# Patient Record
Sex: Female | Born: 1935 | Race: White | Hispanic: No | Smoking: Former smoker
Health system: Southern US, Community
[De-identification: ages and names within clinical notes are randomized; demographics above are authoritative.]

## PROBLEM LIST (undated history)

## (undated) DIAGNOSIS — B029 Zoster without complications: Secondary | ICD-10-CM

## (undated) DIAGNOSIS — I201 Angina pectoris with documented spasm: Secondary | ICD-10-CM

## (undated) DIAGNOSIS — M545 Low back pain, unspecified: Secondary | ICD-10-CM

## (undated) DIAGNOSIS — J189 Pneumonia, unspecified organism: Secondary | ICD-10-CM

## (undated) DIAGNOSIS — I341 Nonrheumatic mitral (valve) prolapse: Secondary | ICD-10-CM

## (undated) DIAGNOSIS — M797 Fibromyalgia: Secondary | ICD-10-CM

## (undated) DIAGNOSIS — Z860100 Personal history of colon polyps, unspecified: Secondary | ICD-10-CM

## (undated) DIAGNOSIS — R002 Palpitations: Secondary | ICD-10-CM

## (undated) DIAGNOSIS — M1990 Unspecified osteoarthritis, unspecified site: Secondary | ICD-10-CM

## (undated) DIAGNOSIS — Z9889 Other specified postprocedural states: Secondary | ICD-10-CM

## (undated) DIAGNOSIS — F419 Anxiety disorder, unspecified: Secondary | ICD-10-CM

## (undated) DIAGNOSIS — K5730 Diverticulosis of large intestine without perforation or abscess without bleeding: Secondary | ICD-10-CM

## (undated) DIAGNOSIS — R112 Nausea with vomiting, unspecified: Secondary | ICD-10-CM

## (undated) HISTORY — DX: Zoster without complications: B02.9

## (undated) HISTORY — PX: TONSILLECTOMY: SUR1361

## (undated) HISTORY — DX: Diverticulosis of large intestine without perforation or abscess without bleeding: K57.30

## (undated) HISTORY — PX: LUMBAR LAMINECTOMY: SHX95

## (undated) HISTORY — DX: Fibromyalgia: M79.7

## (undated) HISTORY — DX: Anxiety disorder, unspecified: F41.9

## (undated) HISTORY — DX: Low back pain, unspecified: M54.50

## (undated) HISTORY — DX: Palpitations: R00.2

## (undated) HISTORY — PX: CATARACT EXTRACTION, BILATERAL: SHX1313

## (undated) HISTORY — DX: Pneumonia, unspecified organism: J18.9

## (undated) HISTORY — DX: Low back pain: M54.5

## (undated) HISTORY — DX: Personal history of colonic polyps: Z86.010

## (undated) HISTORY — DX: Personal history of colon polyps, unspecified: Z86.0100

## (undated) HISTORY — DX: Nonrheumatic mitral (valve) prolapse: I34.1

## (undated) HISTORY — DX: Angina pectoris with documented spasm: I20.1

## (undated) HISTORY — PX: COLONOSCOPY: SHX174

## (undated) HISTORY — PX: CARDIAC CATHETERIZATION: SHX172

---

## 1998-02-03 ENCOUNTER — Inpatient Hospital Stay (HOSPITAL_COMMUNITY): Admission: EM | Admit: 1998-02-03 | Discharge: 1998-02-04 | Payer: Self-pay | Admitting: Emergency Medicine

## 1999-04-13 ENCOUNTER — Other Ambulatory Visit: Admission: RE | Admit: 1999-04-13 | Discharge: 1999-04-13 | Payer: Self-pay | Admitting: Obstetrics and Gynecology

## 2000-04-13 ENCOUNTER — Other Ambulatory Visit: Admission: RE | Admit: 2000-04-13 | Discharge: 2000-04-13 | Payer: Self-pay | Admitting: Obstetrics and Gynecology

## 2000-04-16 ENCOUNTER — Encounter: Payer: Self-pay | Admitting: Otolaryngology

## 2000-04-16 ENCOUNTER — Encounter: Admission: RE | Admit: 2000-04-16 | Discharge: 2000-04-16 | Payer: Self-pay | Admitting: Otolaryngology

## 2001-04-15 ENCOUNTER — Other Ambulatory Visit: Admission: RE | Admit: 2001-04-15 | Discharge: 2001-04-15 | Payer: Self-pay | Admitting: Obstetrics and Gynecology

## 2002-04-15 ENCOUNTER — Other Ambulatory Visit: Admission: RE | Admit: 2002-04-15 | Discharge: 2002-04-15 | Payer: Self-pay | Admitting: Obstetrics and Gynecology

## 2002-05-06 ENCOUNTER — Encounter: Payer: Self-pay | Admitting: Obstetrics and Gynecology

## 2002-05-06 ENCOUNTER — Encounter: Admission: RE | Admit: 2002-05-06 | Discharge: 2002-05-06 | Payer: Self-pay | Admitting: Obstetrics and Gynecology

## 2003-02-06 ENCOUNTER — Ambulatory Visit (HOSPITAL_COMMUNITY): Admission: RE | Admit: 2003-02-06 | Discharge: 2003-02-06 | Payer: Self-pay | Admitting: Pulmonary Disease

## 2003-02-06 ENCOUNTER — Encounter: Payer: Self-pay | Admitting: Pulmonary Disease

## 2003-03-02 HISTORY — PX: CHOLECYSTECTOMY: SHX55

## 2003-03-05 ENCOUNTER — Encounter (INDEPENDENT_AMBULATORY_CARE_PROVIDER_SITE_OTHER): Payer: Self-pay | Admitting: Specialist

## 2003-03-05 ENCOUNTER — Observation Stay (HOSPITAL_COMMUNITY): Admission: RE | Admit: 2003-03-05 | Discharge: 2003-03-06 | Payer: Self-pay | Admitting: Surgery

## 2003-04-21 ENCOUNTER — Other Ambulatory Visit: Admission: RE | Admit: 2003-04-21 | Discharge: 2003-04-21 | Payer: Self-pay | Admitting: Obstetrics and Gynecology

## 2003-06-10 ENCOUNTER — Encounter (INDEPENDENT_AMBULATORY_CARE_PROVIDER_SITE_OTHER): Payer: Self-pay | Admitting: *Deleted

## 2003-06-10 ENCOUNTER — Ambulatory Visit (HOSPITAL_COMMUNITY): Admission: RE | Admit: 2003-06-10 | Discharge: 2003-06-10 | Payer: Self-pay | Admitting: Internal Medicine

## 2003-06-12 ENCOUNTER — Ambulatory Visit (HOSPITAL_COMMUNITY): Admission: RE | Admit: 2003-06-12 | Discharge: 2003-06-12 | Payer: Self-pay | Admitting: Internal Medicine

## 2003-07-10 ENCOUNTER — Ambulatory Visit (HOSPITAL_COMMUNITY): Admission: RE | Admit: 2003-07-10 | Discharge: 2003-07-10 | Payer: Self-pay | Admitting: Internal Medicine

## 2004-04-21 ENCOUNTER — Other Ambulatory Visit: Admission: RE | Admit: 2004-04-21 | Discharge: 2004-04-21 | Payer: Self-pay | Admitting: Obstetrics and Gynecology

## 2004-04-29 ENCOUNTER — Ambulatory Visit: Payer: Self-pay | Admitting: Internal Medicine

## 2004-07-04 ENCOUNTER — Ambulatory Visit: Payer: Self-pay | Admitting: Pulmonary Disease

## 2004-07-05 ENCOUNTER — Ambulatory Visit: Payer: Self-pay | Admitting: Internal Medicine

## 2004-11-14 ENCOUNTER — Ambulatory Visit: Payer: Self-pay | Admitting: Pulmonary Disease

## 2005-01-09 ENCOUNTER — Encounter: Admission: RE | Admit: 2005-01-09 | Discharge: 2005-01-09 | Payer: Self-pay | Admitting: Orthopedic Surgery

## 2005-02-20 ENCOUNTER — Ambulatory Visit: Payer: Self-pay | Admitting: Cardiology

## 2005-05-09 ENCOUNTER — Other Ambulatory Visit: Admission: RE | Admit: 2005-05-09 | Discharge: 2005-05-09 | Payer: Self-pay | Admitting: Obstetrics and Gynecology

## 2005-05-10 ENCOUNTER — Ambulatory Visit: Payer: Self-pay | Admitting: Pulmonary Disease

## 2005-05-16 ENCOUNTER — Ambulatory Visit: Payer: Self-pay | Admitting: Pulmonary Disease

## 2005-05-24 ENCOUNTER — Ambulatory Visit: Payer: Self-pay | Admitting: Pulmonary Disease

## 2005-05-29 ENCOUNTER — Ambulatory Visit: Payer: Self-pay | Admitting: Pulmonary Disease

## 2005-08-28 ENCOUNTER — Ambulatory Visit: Payer: Self-pay | Admitting: Pulmonary Disease

## 2005-09-05 ENCOUNTER — Ambulatory Visit: Payer: Self-pay | Admitting: Pulmonary Disease

## 2005-11-14 ENCOUNTER — Ambulatory Visit: Payer: Self-pay | Admitting: Pulmonary Disease

## 2006-03-13 ENCOUNTER — Ambulatory Visit: Payer: Self-pay | Admitting: Cardiology

## 2006-03-14 ENCOUNTER — Ambulatory Visit: Payer: Self-pay | Admitting: Cardiology

## 2006-05-15 ENCOUNTER — Ambulatory Visit: Payer: Self-pay | Admitting: Pulmonary Disease

## 2006-05-15 LAB — CONVERTED CEMR LAB
Alkaline Phosphatase: 92 units/L (ref 39–117)
Basophils Relative: 2.4 % — ABNORMAL HIGH (ref 0.0–1.0)
CO2: 29 meq/L (ref 19–32)
Calcium: 10 mg/dL (ref 8.4–10.5)
Creatinine, Ser: 0.8 mg/dL (ref 0.4–1.2)
GFR calc Af Amer: 92 mL/min
Lymphocytes Relative: 18.2 % (ref 12.0–46.0)
MCHC: 33.3 g/dL (ref 30.0–36.0)
MCV: 99.8 fL (ref 78.0–100.0)
Monocytes Absolute: 0.6 10*3/uL (ref 0.2–0.7)
Monocytes Relative: 8.4 % (ref 3.0–11.0)
Neutrophils Relative %: 68.3 % (ref 43.0–77.0)
Platelets: 343 10*3/uL (ref 150–400)
Sodium: 139 meq/L (ref 135–145)
Total CHOL/HDL Ratio: 2.8
Triglycerides: 106 mg/dL (ref 0–149)
VLDL: 21 mg/dL (ref 0–40)

## 2006-05-16 ENCOUNTER — Other Ambulatory Visit: Admission: RE | Admit: 2006-05-16 | Discharge: 2006-05-16 | Payer: Self-pay | Admitting: Obstetrics and Gynecology

## 2006-05-21 ENCOUNTER — Ambulatory Visit: Payer: Self-pay | Admitting: Family Medicine

## 2006-05-28 ENCOUNTER — Ambulatory Visit: Payer: Self-pay | Admitting: Pulmonary Disease

## 2006-05-28 LAB — CONVERTED CEMR LAB
OCCULT 1: POSITIVE — AB
OCCULT 2: POSITIVE — AB
OCCULT 4: NEGATIVE
OCCULT 5: NEGATIVE

## 2006-09-20 ENCOUNTER — Ambulatory Visit: Payer: Self-pay | Admitting: Cardiology

## 2006-11-26 ENCOUNTER — Ambulatory Visit: Payer: Self-pay | Admitting: Pulmonary Disease

## 2007-04-30 ENCOUNTER — Telehealth: Payer: Self-pay | Admitting: Pulmonary Disease

## 2007-05-07 ENCOUNTER — Ambulatory Visit: Payer: Self-pay | Admitting: Pulmonary Disease

## 2007-05-20 DIAGNOSIS — M797 Fibromyalgia: Secondary | ICD-10-CM

## 2007-05-20 DIAGNOSIS — M545 Low back pain, unspecified: Secondary | ICD-10-CM | POA: Insufficient documentation

## 2007-05-20 DIAGNOSIS — F411 Generalized anxiety disorder: Secondary | ICD-10-CM

## 2007-05-21 ENCOUNTER — Other Ambulatory Visit: Admission: RE | Admit: 2007-05-21 | Discharge: 2007-05-21 | Payer: Self-pay | Admitting: Obstetrics and Gynecology

## 2007-05-21 ENCOUNTER — Ambulatory Visit: Payer: Self-pay | Admitting: Pulmonary Disease

## 2007-05-21 DIAGNOSIS — I10 Essential (primary) hypertension: Secondary | ICD-10-CM

## 2007-05-21 DIAGNOSIS — M810 Age-related osteoporosis without current pathological fracture: Secondary | ICD-10-CM | POA: Insufficient documentation

## 2007-05-21 DIAGNOSIS — R002 Palpitations: Secondary | ICD-10-CM

## 2007-05-21 DIAGNOSIS — K5730 Diverticulosis of large intestine without perforation or abscess without bleeding: Secondary | ICD-10-CM | POA: Insufficient documentation

## 2007-05-21 DIAGNOSIS — J329 Chronic sinusitis, unspecified: Secondary | ICD-10-CM | POA: Insufficient documentation

## 2007-05-21 DIAGNOSIS — I2510 Atherosclerotic heart disease of native coronary artery without angina pectoris: Secondary | ICD-10-CM

## 2007-05-21 LAB — CONVERTED CEMR LAB
AST: 25 units/L (ref 0–37)
BUN: 8 mg/dL (ref 6–23)
Basophils Absolute: 0.1 10*3/uL (ref 0.0–0.1)
Basophils Relative: 1.7 % — ABNORMAL HIGH (ref 0.0–1.0)
Bilirubin Urine: NEGATIVE
CO2: 28 meq/L (ref 19–32)
Calcium: 9.8 mg/dL (ref 8.4–10.5)
Creatinine, Ser: 0.8 mg/dL (ref 0.4–1.2)
Direct LDL: 84.3 mg/dL
HCT: 39.3 % (ref 36.0–46.0)
Hemoglobin, Urine: NEGATIVE
Ketones, ur: NEGATIVE mg/dL
Leukocytes, UA: NEGATIVE
Neutro Abs: 2.8 10*3/uL (ref 1.4–7.7)
Neutrophils Relative %: 57.5 % (ref 43.0–77.0)
Sodium: 139 meq/L (ref 135–145)
Specific Gravity, Urine: 1.01 (ref 1.000–1.03)
Total Protein: 6.8 g/dL (ref 6.0–8.3)
Urine Glucose: NEGATIVE mg/dL
pH: 7.5 (ref 5.0–8.0)

## 2007-07-03 ENCOUNTER — Ambulatory Visit: Payer: Self-pay | Admitting: Internal Medicine

## 2007-07-12 ENCOUNTER — Encounter: Payer: Self-pay | Admitting: Pulmonary Disease

## 2007-07-12 ENCOUNTER — Ambulatory Visit: Payer: Self-pay | Admitting: Internal Medicine

## 2007-07-12 ENCOUNTER — Encounter: Payer: Self-pay | Admitting: Internal Medicine

## 2007-07-12 LAB — CONVERTED CEMR LAB
Hemoglobin: 13.5 g/dL (ref 12.0–15.0)
Iron: 103 ug/dL (ref 42–145)
Lymphocytes Relative: 23.4 % (ref 12.0–46.0)
MCV: 103 fL — ABNORMAL HIGH (ref 78.0–100.0)
Monocytes Relative: 10.9 % (ref 3.0–11.0)
Neutro Abs: 3.2 10*3/uL (ref 1.4–7.7)
RBC: 4.05 M/uL (ref 3.87–5.11)
RDW: 11.9 % (ref 11.5–14.6)
Saturation Ratios: 35.2 % (ref 20.0–50.0)
Transferrin: 209.3 mg/dL — ABNORMAL LOW (ref >=212.0)
WBC: 4.8 10*3/uL (ref 4.5–10.5)

## 2007-07-24 ENCOUNTER — Ambulatory Visit: Payer: Self-pay | Admitting: Internal Medicine

## 2007-07-24 LAB — CONVERTED CEMR LAB
Fecal Occult Blood: NEGATIVE
OCCULT 1: NEGATIVE
OCCULT 3: NEGATIVE

## 2007-09-30 ENCOUNTER — Ambulatory Visit: Payer: Self-pay | Admitting: Cardiology

## 2007-11-12 ENCOUNTER — Ambulatory Visit: Payer: Self-pay | Admitting: Pulmonary Disease

## 2007-11-12 DIAGNOSIS — K5520 Angiodysplasia of colon without hemorrhage: Secondary | ICD-10-CM | POA: Insufficient documentation

## 2007-11-17 DIAGNOSIS — K589 Irritable bowel syndrome without diarrhea: Secondary | ICD-10-CM | POA: Insufficient documentation

## 2007-11-17 DIAGNOSIS — D126 Benign neoplasm of colon, unspecified: Secondary | ICD-10-CM

## 2007-11-17 LAB — CONVERTED CEMR LAB: Vit D, 1,25-Dihydroxy: 63 (ref 30–89)

## 2008-04-28 ENCOUNTER — Encounter: Payer: Self-pay | Admitting: Pulmonary Disease

## 2008-05-07 ENCOUNTER — Telehealth: Payer: Self-pay | Admitting: Pulmonary Disease

## 2008-05-25 ENCOUNTER — Other Ambulatory Visit: Admission: RE | Admit: 2008-05-25 | Discharge: 2008-05-25 | Payer: Self-pay | Admitting: Obstetrics and Gynecology

## 2008-05-25 ENCOUNTER — Encounter: Payer: Self-pay | Admitting: Obstetrics and Gynecology

## 2008-05-25 ENCOUNTER — Ambulatory Visit: Payer: Self-pay | Admitting: Obstetrics and Gynecology

## 2008-06-04 ENCOUNTER — Telehealth: Payer: Self-pay | Admitting: Pulmonary Disease

## 2008-06-08 ENCOUNTER — Encounter: Payer: Self-pay | Admitting: Pulmonary Disease

## 2008-06-08 ENCOUNTER — Ambulatory Visit: Payer: Self-pay | Admitting: Internal Medicine

## 2008-06-16 ENCOUNTER — Ambulatory Visit: Payer: Self-pay | Admitting: Pulmonary Disease

## 2008-06-23 ENCOUNTER — Ambulatory Visit: Payer: Self-pay | Admitting: Pulmonary Disease

## 2008-06-23 LAB — CONVERTED CEMR LAB
ALT: 18 units/L (ref 0–35)
Alkaline Phosphatase: 47 units/L (ref 39–117)
Basophils Absolute: 0 10*3/uL (ref 0.0–0.1)
CO2: 29 meq/L (ref 19–32)
Cholesterol: 187 mg/dL (ref 0–200)
Eosinophils Absolute: 0.1 10*3/uL (ref 0.0–0.7)
GFR calc non Af Amer: 88 mL/min
Glucose, Bld: 87 mg/dL (ref 70–99)
Hemoglobin, Urine: NEGATIVE
Hemoglobin: 14.2 g/dL (ref 12.0–15.0)
Lymphocytes Relative: 37 % (ref 12.0–46.0)
MCV: 99.3 fL (ref 78.0–100.0)
Neutrophils Relative %: 51.5 % (ref 43.0–77.0)
Nitrite: NEGATIVE
Platelets: 227 10*3/uL (ref 150–400)
RBC: 4.05 M/uL (ref 3.87–5.11)
Sodium: 140 meq/L (ref 135–145)
Total Bilirubin: 1 mg/dL (ref 0.3–1.2)
Total Protein: 6.5 g/dL (ref 6.0–8.3)
Urobilinogen, UA: 0.2 (ref 0.0–1.0)
VLDL: 18 mg/dL (ref 0–40)
pH: 7 (ref 5.0–8.0)

## 2008-09-29 ENCOUNTER — Ambulatory Visit: Payer: Self-pay | Admitting: Cardiology

## 2009-01-20 ENCOUNTER — Ambulatory Visit: Payer: Self-pay | Admitting: Pulmonary Disease

## 2009-06-08 ENCOUNTER — Ambulatory Visit: Payer: Self-pay | Admitting: Obstetrics and Gynecology

## 2009-06-08 ENCOUNTER — Other Ambulatory Visit: Admission: RE | Admit: 2009-06-08 | Discharge: 2009-06-08 | Payer: Self-pay | Admitting: Obstetrics and Gynecology

## 2009-06-29 ENCOUNTER — Telehealth: Payer: Self-pay | Admitting: Pulmonary Disease

## 2009-07-02 ENCOUNTER — Ambulatory Visit: Payer: Self-pay | Admitting: Pulmonary Disease

## 2009-07-19 ENCOUNTER — Ambulatory Visit: Payer: Self-pay | Admitting: Pulmonary Disease

## 2009-07-19 LAB — CONVERTED CEMR LAB
AST: 23 units/L (ref 0–37)
BUN: 11 mg/dL (ref 6–23)
Basophils Relative: 0.5 % (ref 0.0–3.0)
CO2: 28 meq/L (ref 19–32)
Calcium: 9.9 mg/dL (ref 8.4–10.5)
Chloride: 100 meq/L (ref 96–112)
Cholesterol: 196 mg/dL (ref 0–200)
Creatinine, Ser: 0.7 mg/dL (ref 0.4–1.2)
Glucose, Bld: 82 mg/dL (ref 70–99)
HCT: 44.2 % (ref 36.0–46.0)
Lymphocytes Relative: 32.9 % (ref 12.0–46.0)
MCV: 103.5 fL — ABNORMAL HIGH (ref 78.0–100.0)
Monocytes Absolute: 0.7 10*3/uL (ref 0.1–1.0)
Monocytes Relative: 10.8 % (ref 3.0–12.0)
Neutro Abs: 3.3 10*3/uL (ref 1.4–7.7)
Neutrophils Relative %: 54 % (ref 43.0–77.0)
Triglycerides: 76 mg/dL (ref 0.0–149.0)
VLDL: 15.2 mg/dL (ref 0.0–40.0)

## 2009-08-03 ENCOUNTER — Telehealth (INDEPENDENT_AMBULATORY_CARE_PROVIDER_SITE_OTHER): Payer: Self-pay | Admitting: *Deleted

## 2009-08-06 ENCOUNTER — Ambulatory Visit: Payer: Self-pay | Admitting: Pulmonary Disease

## 2009-08-06 DIAGNOSIS — J209 Acute bronchitis, unspecified: Secondary | ICD-10-CM

## 2009-08-23 ENCOUNTER — Ambulatory Visit: Payer: Self-pay | Admitting: Pulmonary Disease

## 2009-08-23 ENCOUNTER — Telehealth (INDEPENDENT_AMBULATORY_CARE_PROVIDER_SITE_OTHER): Payer: Self-pay | Admitting: *Deleted

## 2009-08-30 ENCOUNTER — Telehealth: Payer: Self-pay | Admitting: Adult Health

## 2009-09-08 ENCOUNTER — Ambulatory Visit: Payer: Self-pay | Admitting: Pulmonary Disease

## 2009-09-28 ENCOUNTER — Encounter (INDEPENDENT_AMBULATORY_CARE_PROVIDER_SITE_OTHER): Payer: Self-pay | Admitting: *Deleted

## 2009-09-28 ENCOUNTER — Ambulatory Visit: Payer: Self-pay | Admitting: Pulmonary Disease

## 2009-09-28 DIAGNOSIS — J189 Pneumonia, unspecified organism: Secondary | ICD-10-CM

## 2009-09-28 DIAGNOSIS — R05 Cough: Secondary | ICD-10-CM

## 2009-09-28 LAB — CONVERTED CEMR LAB
Basophils Relative: 0.2 % (ref 0.0–3.0)
Creatinine, Ser: 0.6 mg/dL (ref 0.4–1.2)
Eosinophils Absolute: 0.1 10*3/uL (ref 0.0–0.7)
Eosinophils Relative: 1.1 % (ref 0.0–5.0)
GFR calc non Af Amer: 106.38 mL/min (ref >60)
Glucose, Bld: 104 mg/dL — ABNORMAL HIGH (ref 70–99)
Lymphocytes Relative: 25.4 % (ref 12.0–46.0)
Lymphs Abs: 2 10*3/uL (ref 0.7–4.0)
MCV: 99.5 fL (ref 78.0–100.0)
Platelets: 272 10*3/uL (ref 150.0–400.0)
RBC: 3.97 M/uL (ref 3.87–5.11)
Sed Rate: 7 mm/hr (ref 0–22)
Sodium: 136 meq/L (ref 135–145)

## 2009-09-29 ENCOUNTER — Ambulatory Visit: Payer: Self-pay | Admitting: Cardiology

## 2009-09-30 ENCOUNTER — Ambulatory Visit: Payer: Self-pay | Admitting: Cardiology

## 2009-10-06 ENCOUNTER — Telehealth (INDEPENDENT_AMBULATORY_CARE_PROVIDER_SITE_OTHER): Payer: Self-pay | Admitting: *Deleted

## 2010-01-17 ENCOUNTER — Telehealth: Payer: Self-pay | Admitting: Pulmonary Disease

## 2010-01-17 ENCOUNTER — Ambulatory Visit: Payer: Self-pay | Admitting: Pulmonary Disease

## 2010-04-20 ENCOUNTER — Encounter: Payer: Self-pay | Admitting: Pulmonary Disease

## 2010-04-21 ENCOUNTER — Ambulatory Visit: Payer: Self-pay | Admitting: Pulmonary Disease

## 2010-04-21 DIAGNOSIS — J069 Acute upper respiratory infection, unspecified: Secondary | ICD-10-CM | POA: Insufficient documentation

## 2010-04-27 ENCOUNTER — Telehealth: Payer: Self-pay | Admitting: Adult Health

## 2010-05-31 NOTE — Assessment & Plan Note (Signed)
Summary: NP follow up - PNA   Primary Provider/Referring Provider:  Dr. Kriste Basque  CC:  follow up PNA - still having dry cough, wheezing, occ SOB, and irritation in brochials.  pt states overall she is better than last ov.  History of Present Illness: [name] with known hx of HTN,      ~  July 19, 2009:  she's had a good 60mo- recent fasting labs all WNL... BP controlled on Norvasc;  occas palpit, stress related, & resolve spont or w/ Alpraz;  no CP/ SOB/ etc...  she had f/u DrGottsegen for GYN & she relayed a message asking to come off the Fosamax for drug holiday- OK...  August 06, 2009--Presents for an acute office visit. Complains of increased SOB, sore throat w/ PND, dry cough, chest congestion, pain under shoulder on right side, temp up to 100.8, body aches x5days - has been taking hydromet, mucinex has 2 doses left of zpak. Husband with similar symptoms. Cough, congestion are no better. Throat is very sore. She does not feel good, has lots of body aches.   August 23, 2009 --Pt presents for persistent symptoms. Complains of cough, congestion , draiange and low grade fevers. Seen 2 weeks ago, tx for URI w/ zpack and steroid taper. Got better but 1 week after finishing meds cough restarted.   She is using netti pot. Has some drainage, mainly clear. Chest congestion mainly thick hard to get up, using mucinex dm . Labs last month essentially unremarkable w/ cbc/bmet/tsh.   Sep 08, 2009--Returns for follow up PNA - still having dry cough, wheezing, occ SOB, irritation in brochials.  pt states overall she is better than last ov. She is feeling stronger, energy level is getting better. Dry cough is main complaint. Tessalon and Hydromet help. She did stop mucinex DM b/c she ran out. Also restarted fish oil. She is eating but not as much b/c she is not burning as much calories. She does not want to gain any weight. We discussed calorie need during this time of infection. Her cough is mainly dry w/ no  discolored mucus or hemoptysis. Ribs are sore from coughing. No hemoptysis. Denies chest pain, dyspnea, orthopnea, hemoptysis, fever, n/v/d, edema, headache. denies reflux but has bad taste in mouth/throat in am when she gets up.      Medications Prior to Update: 1)  Zyrtec Allergy 10 Mg Tabs (Cetirizine Hcl) .... Take 1 Tablet By Mouth Once A Day 2)  Adult Aspirin Ec Low Strength 81 Mg  Tbec (Aspirin) .... Take 1 Tablet By Mouth Once A Day 3)  Norvasc 2.5 Mg  Tabs (Amlodipine Besylate) .... Take 1 Tablet By Mouth Once A Day 4)  Nitroglycerin 0.4 Mg Subl (Nitroglycerin) .... One Tablet Under Tongue Every 5 Minutes As Needed For Chest Pain---May Repeat Times Three 5)  Fish Oil Double Strength 1200 Mg  Caps (Omega-3 Fatty Acids) .... Take One Capsule By Mouth Two Times A Day 6)  Clidinium-Chlordiazepoxide 2.5-5 Mg Caps (Clidinium-Chlordiazepoxide) .... Take 1 Cap By Mouth Three Times A Day As Needed For Abd Cramping... 7)  Premarin 0.625 Mg/gm  Crea (Estrogens, Conjugated) .... Apply Three Times Weekly 8)  Calcium 600 1500 Mg  Tabs (Calcium Carbonate) .... Take 4 Tabs Once Daily 9)  Multivitamins   Tabs (Multiple Vitamin) .... Take 1 Tablet By Mouth Once A Day 10)  Trazodone Hcl 100 Mg  Tabs (Trazodone Hcl) .Marland Kitchen.. 1 Tab By Mouth At Bedtime 11)  Alprazolam 0.5 Mg  Tabs (Alprazolam) .... Take 1/2 To 1 Tab By Mouth Three Times A Day As Directed... 12)  Terconazole 0.4 % Crea (Terconazole) .... Use 1 Times Per Week 13)  Flexeril 10 Mg Tabs (Cyclobenzaprine Hcl) .... Take 1 Tab By Mouth Three Times A Day As Needed For Muscle Spasm... 14)  Glucosamine 500 Mg Caps (Glucosamine Sulfate) .... Take 2 Capsules Daily 15)  Hydromet 5-1.5 Mg/12ml Syrp (Hydrocodone-Homatropine) .Marland Kitchen.. 1-2 Tsp Every 4-6 Hr As Needed Cough 16)  Avelox 400 Mg Tabs (Moxifloxacin Hcl) .Marland Kitchen.. 1 By Mouth Once Daily 17)  Tessalon 200 Mg Caps (Benzonatate) .... Take 1 Capsule By Mouth Three Times A Day As Needed Cough  Current Medications  (verified): 1)  Zyrtec Allergy 10 Mg Tabs (Cetirizine Hcl) .... Take 1 Tablet By Mouth Once A Day 2)  Adult Aspirin Ec Low Strength 81 Mg  Tbec (Aspirin) .... Take 1 Tablet By Mouth Once A Day 3)  Norvasc 2.5 Mg  Tabs (Amlodipine Besylate) .... Take 1 Tablet By Mouth Once A Day 4)  Nitroglycerin 0.4 Mg Subl (Nitroglycerin) .... One Tablet Under Tongue Every 5 Minutes As Needed For Chest Pain---May Repeat Times Three 5)  Fish Oil Double Strength 1200 Mg  Caps (Omega-3 Fatty Acids) .... Take One Capsule By Mouth Two Times A Day 6)  Clidinium-Chlordiazepoxide 2.5-5 Mg Caps (Clidinium-Chlordiazepoxide) .... Take 1 Cap By Mouth Three Times A Day As Needed For Abd Cramping... 7)  Premarin 0.625 Mg/gm  Crea (Estrogens, Conjugated) .... Apply Three Times Weekly 8)  Calcium 600 1500 Mg  Tabs (Calcium Carbonate) .... Take 4 Tabs Once Daily 9)  Multivitamins   Tabs (Multiple Vitamin) .... Take 1 Tablet By Mouth Once A Day 10)  Trazodone Hcl 100 Mg  Tabs (Trazodone Hcl) .Marland Kitchen.. 1 Tab By Mouth At Bedtime 11)  Alprazolam 0.5 Mg  Tabs (Alprazolam) .... Take 1/2 To 1 Tab By Mouth Three Times A Day As Directed... 12)  Terconazole 0.4 % Crea (Terconazole) .... Use 1 Times Per Week 13)  Flexeril 10 Mg Tabs (Cyclobenzaprine Hcl) .... Take 1 Tab By Mouth Three Times A Day As Needed For Muscle Spasm... 14)  Glucosamine 500 Mg Caps (Glucosamine Sulfate) .... Take 2 Capsules Daily 15)  Hydromet 5-1.5 Mg/23ml Syrp (Hydrocodone-Homatropine) .Marland Kitchen.. 1-2 Tsp Every 4-6 Hr As Needed Cough 16)  Tessalon 200 Mg Caps (Benzonatate) .... Take 1 Capsule By Mouth Three Times A Day As Needed Cough  Allergies (verified): 1)  ! Codeine 2)  ! Erythromycin  Past History:  Past Surgical History: Last updated: 07/19/2009 S/P T&A as a child Cholecystectomy - Lap Chole 11/04 S/P LLam  Family History: Last updated: 08/06/2009 emphysema/COPD - maternal aunts (2) allergies - MGF asthma - MGF heart disease - father with CHF rheumatism -  mother stroke - mother  Social History: Last updated: 08/06/2009 former smoker, quit 1980 - x24yrs, 1/2ppd  Alcohol Use - yes married 1 children retired: taught preschool, Diplomatic Services operational officer  Risk Factors: Smoking Status: quit (09/24/2008)  Past Medical History: Hx of SINUSITIS (ICD-473.9) - uses CLARITIN OTC & FLONASE as needed...  PNEUMONIA- 07/2009-tx w/ abx -CXR RML/lingula aspdz , follow up cxr>>  HYPERTENSION (ICD-401.9) - controlled on NORVASC 2.5mg /d.....  CORONARY ARTERY DISEASE (ICD-414.00) & Hx of PALPITATIONS (ICD-785.1) - on ASA 81mg /d, and FISH OIL daily... hx coronary spasm w/ non-obstructive CAD on cath 1999 (20-30% diagonal branch LAD only)... sl anterobasal HK & EF=59%... yearly f/u DrHochrein 6/10 stable- no changes/ avoid caffeine.Marland Kitchen   DIVERTICULOSIS OF COLON (ICD-562.10), IBS (  ICD-564.1), & COLONIC POLYPS (ICD-211.3) -  ~  colonoscopy 12/03 by DrDBrodie showing divertics only... f/u planned 26yrs...  ~  eval 3/09 for hematochezia w/ Hg 13.5, Fe 103, repeat colon w/ 3mm polyp= hyperplastic + angiodysplasia... f/u planned 13yrs...  ~  Feb10: notes some increased abd cramping/ IBS- we will refill her LIBRAX for Prn use...  Hx of COLONIC ANGIODYSPLASIA (ICD-569.84) - as above.Marland Kitchen  BACK PAIN, LUMBAR (ICD-724.2) - S/P LLam L4-5 yrs ago... ortho eval 9/06 by DrApp[place] w/ MRI showing DDD... conservative rx...  FIBROMYALGIA (ICD-729.1) - resting OK w/ the TRAZADONE 100mg Qhs...  OSTEOPOROSIS (ICD-733.00) - on FOSAMAX w/ D since 3/06 (she insists on this Rx), +Calcium, +MVI...   ~  BMD 1/08 was improved w/ TScores -1.5 to -2.1 on FOSAMAX therapy...   ~  7/09:  she wants to discuss stopping Fosamax in favor of Vit K which she read in "[employer]: Personal" newsletter- mult questions answered... we will check Vit D level (normal= 63) and I rec that she continue the Fosamax, calcium supplements, multivit therapy...  ~  2/10:  f/u BMD showed TScores -1.3 in Spine, & -1.8 in left  FemNeck = improved...   ~  3/11:  ready for a drug holiday after 28yrs on Rx>> OK to stop Alendronate now...  ANXIETY (ICD-300.00) - uses ALPRAZOLAM 0.5mg  Tid Prn...  Review of Systems      See HPI  Vital Signs:  Patient profile:   75 year old female Height:      64 inches Weight:      101 pounds BMI:     17.40 O2 Sat:      98 % on Room air Temp:     97.0 degrees F oral Pulse rate:   74 / minute BP sitting:   118 / 68  (left arm) Cuff size:   regular  Vitals Entered By: Boone Master CNA (Sep 08, 2009 10:10 AM)  O2 Flow:  Room air  Physical Exam  Additional Exam:  WD, WN, [name] in NAD... GENERAL:  Alert & oriented; pleasant & cooperative... HEENT:  [place]/AT, EOM-wnl, PERRLA, EACs-clear, TMs-wnl, NOSE-clear discharge,  THROAT-clear & wnl. NECK:  Supple w/ full ROM; no JVD; normal carotid impulses w/o bruits; no thyromegaly or nodules palpated; no lymphadenopathy. CHEST:  Clear to P & A; without wheezes/ rales/ or rhonchi  HEART:  Regular Rhythm; without murmurs/ rubs/ or gallops. ABDOMEN:  Soft & nontender; normal bowel sounds; no organomegaly or masses detected. EXT: without deformities, mild arthritic changes; no varicose veins/ venous insuffic/ or edema.     Impression & Recommendations:  Problem # 1:  PNEUMONIA (ICD-486)   clinically she is improivng .Xray w/ RML aspdz decreased in size w/ increased marking/new aspdz in lingula Will continue on present regimen, w/ cough suppression, add PPI to avoid reflux aggravating upper airway.  stop fish oil. repeat xray in 3 weeks. if not improving may need to consider CT chest . She is a former smoker.  advised on diet- w/ adequate caloric intake. -this is not a time to concentrate on weight/calorie intake.   Case discused and reviewed w/ Dr. Kriste Basque w/ follow up scheduled w/ follow up cxr on return.  cont w/ plan of care as above.  The following medications were removed from the medication list:    Avelox 400 Mg Tabs  (Moxifloxacin hcl) .Marland Kitchen... 1 by mouth once daily  Orders: T-2 View CXR (71020TC) Est. Patient Level IV (40102)  Complete Medication List: 1)  Zyrtec Allergy 10 Mg Tabs (Cetirizine hcl) .... Take 1 tablet by mouth once a day 2)  Adult Aspirin Ec Low Strength 81 Mg Tbec (Aspirin) .... Take 1 tablet by mouth once a day 3)  Norvasc 2.5 Mg Tabs (Amlodipine besylate) .... Take 1 tablet by mouth once a day 4)  Nitroglycerin 0.4 Mg Subl (Nitroglycerin) .... One tablet under tongue every 5 minutes as needed for chest pain---may repeat times three 5)  Fish Oil Double Strength 1200 Mg Caps (Omega-3 fatty acids) .... Take one capsule by mouth two times a day 6)  Clidinium-chlordiazepoxide 2.5-5 Mg Caps (Clidinium-chlordiazepoxide) .... Take 1 cap by mouth three times a day as needed for abd cramping... 7)  Premarin 0.625 Mg/gm Crea (Estrogens, conjugated) .... Apply three times weekly 8)  Calcium 600 1500 Mg Tabs (Calcium carbonate) .... Take 4 tabs once daily 9)  Multivitamins Tabs (Multiple vitamin) .... Take 1 tablet by mouth once a day 10)  Trazodone Hcl 100 Mg Tabs (Trazodone hcl) .Marland Kitchen.. 1 tab by mouth at bedtime 11)  Alprazolam 0.5 Mg Tabs (Alprazolam) .... Take 1/2 to 1 tab by mouth three times a day as directed... 12)  Terconazole 0.4 % Crea (Terconazole) .... Use 1 times per week 13)  Flexeril 10 Mg Tabs (Cyclobenzaprine hcl) .... Take 1 tab by mouth three times a day as needed for muscle spasm... 14)  Glucosamine 500 Mg Caps (Glucosamine sulfate) .... Take 2 capsules daily 15)  Hydromet 5-1.5 Mg/21ml Syrp (Hydrocodone-homatropine) .Marland Kitchen.. 1-2 tsp every 4-6 hr as needed cough 16)  Tessalon 200 Mg Caps (Benzonatate) .... Take 1 capsule by mouth three times a day as needed cough  Patient Instructions: 1)  Stop fish oil.  2)  Restart Mucinex DM two times a day as needed cough/congestion 3)  Fluids, rest and tylenol as needed  4)  Advance actiivty as tolerated.  5)  Begin Prilosec 20mg  once daily for 3  weeks.  6)  follow up 3 weeks Dr. Kriste Basque for follow up chest xray .  7)  Please contact office for sooner follow up if symptoms do not improve or worsen  8)

## 2010-05-31 NOTE — Assessment & Plan Note (Signed)
Summary: 3 week follow up PNA w/ cxr///JJ   Primary Care Provider:  Dr. Kriste Basque  CC:  2+ month ROV & add-on for persistant symptoms... .  History of Present Illness: [name] here for a 6 month follow up visit... she has multiple medical problems as noted below...     ~  Feb10:  she has been under alot of stress w/ her daughter's divorce and 28 y/o grandaughter that she hasn't seen as much... increased anxiety issues, but the Alprazolam really helps... she notes appetite good, weight stable... notes some abd cramping w/ help from Librax in the past- we will refill Rx... recent labs all look good!  ~  Sep10:  presents c/o FM flair w/ plantar fasciitis pain + discomfort in right thigh area "pulled muscle" (she notes benefit from therapeutic massage)... stress w/ husb's back surg, etc... she saw DrHochrein 6/10 w/ palpit related to the stress & he agreed w/ Rebeca Allegra Rx, no caffeine, etc... we discussed stretching exercises, hot soaks, continue Trazadone, add Flexeril...   ~  Mar11:  she's had a good 88mo- recent fasting labs all WNL... BP controlled on Norvasc;  occas palpit, stress related, & resolve spont or w/ Alpraz;  no CP/ SOB/ etc...  she had f/u DrGottsegen for GYN & she relayed a message asking to come off the Fosamax for drug holiday- OK...   ~  Sep 28, 2009:  she had a resp illness w/ marked constitutional symptoms 4/11 & saw TP on several occas w/ Zpak, Avelox, Steroid taper, Mucinex, Hydromet, Tessalon... still feels crummy w/ dry cough, hacking, and several times mentioned that she has never been this sick... CXR's reviewed> 4/25 w/ RML pneumonia;  5/11 showed improved RML & new lingular opac;  5/31 showed improvement... we discussed checking labs (CBC, Sed, BMet= normal), and CT Chest for completeness (pending)... Rx w/ Depo80, Medrol 5dtaper, Tussionex Prn...   Current Problem List:  Hx of SINUSITIS (ICD-473.9) - uses CLARITIN OTC & FLONASE as needed... she notes that her allergies are  bothering her recently.  Hx of PNEUMONIA (ICD-486) - ** SEE ABOVE **  HYPERTENSION (ICD-401.9) - controlled on NORVASC 2.5mg /d... tol well... BP=130/80... denies HA, fatigue, visual changes, CP, palipit, dizziness, syncope, dyspnea, edema, etc...  CORONARY ARTERY DISEASE (ICD-414.00) & Hx of PALPITATIONS (ICD-785.1) - on ASA 81mg /d, and FISH OIL daily... hx coronary spasm w/ non-obstructive CAD on cath 1999 (20-30% diagonal branch LAD only)... sl anterobasal HK & EF=59%... yearly f/u DrHochrein 6/10 stable- no changes/ avoid caffeine... she reports some incr palpit w/ stress- Alpraz 0.5mg  helps...  DIVERTICULOSIS OF COLON (ICD-562.10), IBS (ICD-564.1), & COLONIC POLYPS (ICD-211.3) -  ~  colonoscopy 12/03 by DrDBrodie showing divertics only... f/u planned 20yrs...  ~  eval 3/09 for hematochezia w/ Hg 13.5, Fe 103, repeat colon w/ 3mm polyp= hyperplastic + angiodysplasia... f/u planned 5yrs...  ~  Feb10: notes some increased abd cramping/ IBS- we will refill her LIBRAX for Prn use...  Hx of COLONIC ANGIODYSPLASIA (ICD-569.84) - as above.Marland Kitchen  BACK PAIN, LUMBAR (ICD-724.2) - S/P LLam L4-5 yrs ago... ortho eval 9/06 by DrApp[place] w/ MRI showing DDD... conservative rx...  FIBROMYALGIA (ICD-729.1) - resting OK w/ the TRAZADONE 100mg Qhs...  OSTEOPOROSIS (ICD-733.00) - on FOSAMAX w/ D (she insists on this Rx), +Calcium, +MVI...   ~  BMD 1/08 was improved w/ TScores -1.5 to -2.1 on FOSAMAX therapy...   ~  7/09:  she wants to discuss stopping Fosamax in favor of Vit K which she read  in "[employer]: Personal" newsletter- mult questions answered... we will check Vit D level (normal= 63) and I rec that she continue the Fosamax, calcium supplements, multivit therapy...  ~  2/10:  f/u BMD showed TScores -1.3 in Spine, & -1.8 in left FemNeck = improved...   ANXIETY (ICD-300.00) - uses ALPRAZOLAM 0.5mg  Tid Prn...  ***HEALTH MAINTENANCE:  she notes brother had shingles this yr and we discussed the shingles  vaccine- I rec that she get this shot at the health dept, esp in light of the new rec's from [facility] re: family history of shingles... she had PNEUMOVAX in 2007... gets yearly FLU SHOT & had H1N1 in 2009 as well... GYN = DrGottsegen and he has her on Premarin Rx + vag cream...   Allergies: 1)  ! Codeine 2)  ! Erythromycin  Comments:  Nurse/Medical Assistant: The patient's medications and allergies were reviewed with the patient and were updated in the Medication and Allergy Lists.  Past History:  Past Medical History: Hx of SINUSITIS (ICD-473.9) Hx of PNEUMONIA (ICD-486) HYPERTENSION (ICD-401.9) CORONARY ARTERY DISEASE (ICD-414.00) Hx of PALPITATIONS (ICD-785.1) DIVERTICULOSIS OF COLON (ICD-562.10) IRRITABLE BOWEL SYNDROME (ICD-564.1) COLONIC POLYPS (ICD-211.3) Hx of COLONIC ANGIODYSPLASIA (ICD-569.84) BACK PAIN, LUMBAR (ICD-724.2) FIBROMYALGIA (ICD-729.1) OSTEOPOROSIS (ICD-733.00) ANXIETY (ICD-300.00)  Past Surgical History: S/P T&A as a child Cholecystectomy - Lap Chole 11/04 S/P LLam  Family History: Reviewed history from 08/06/2009 and no changes required. emphysema/COPD - maternal aunts (2) allergies - MGF asthma - MGF heart disease - father with CHF rheumatism - mother stroke - mother  Social History: Reviewed history from 08/06/2009 and no changes required. former smoker, quit 1980 - x81yrs, 1/2ppd  Alcohol Use - yes married 1 children retired: taught preschool, Diplomatic Services operational officer  Review of Systems      See HPI       The patient complains of decreased hearing, dyspnea on exertion, and prolonged cough.  The patient denies anorexia, fever, weight loss, weight gain, vision loss, hoarseness, chest pain, syncope, peripheral edema, headaches, hemoptysis, abdominal pain, melena, hematochezia, severe indigestion/heartburn, hematuria, incontinence, muscle weakness, suspicious skin lesions, transient blindness, difficulty walking, depression, unusual weight change, abnormal  bleeding, enlarged lymph nodes, and angioedema.    Vital Signs:  Patient profile:   75 year old female Height:      64 inches Weight:      102 pounds BMI:     17.57 O2 Sat:      98 % on Room air Temp:     97.3 degrees F oral Pulse rate:   96 / minute BP sitting:   130 / 80  (left arm) Cuff size:   regular  Vitals Entered By: Randell Loop CMA (Sep 28, 2009 2:55 PM)  O2 Sat at Rest %:  98 O2 Flow:  Room air CC: 2+ month ROV & add-on for persistant symptoms...  Is Patient Diabetic? No Pain Assessment Patient in pain? yes      Onset of pain  some chest irritation Comments meds updated today   Physical Exam  Additional Exam:  WD, WN, [name] in NAD... GENERAL:  Alert & oriented; pleasant & cooperative... HEENT:  /AT, EOM-wnl, PERRLA, EACs-clear, TMs-wnl, NOSE-clear, THROAT-clear & wnl. NECK:  Supple w/ full ROM; no JVD; normal carotid impulses w/o bruits; no thyromegaly or nodules palpated; no lymphadenopathy. CHEST:  Clear to P & A; without wheezes/ rales/ or rhonchi heard. HEART:  Regular Rhythm; without murmurs/ rubs/ or gallops detected. ABDOMEN:  Soft & nontender; normal bowel sounds; no organomegaly  or masses palpated. EXT: without deformities, mild arthritic changes; no varicose veins/ venous insuffic/ or edema. Mult trigger points c/w FM... NEURO:  CN's intact; motor testing normal; sensory testing normal; gait normal & balance OK. DERM:  No lesions noted; no rash etc...    CXR  Procedure date:  09/28/2009  Findings:      CHEST - 2 VIEW Comparison: 09/08/2009.  08/23/2009.   Findings: The cardiac silhouette is normal size and shape. Minimal nonaneurysmal aortic calcification is present.  No pleural effusion is seen.  There is continued infiltrative density seen within the medial segment of the right middle lobe with loss of definition of the margin of the right side of the cardiac silhouette on the PA image.  On the lateral image there is slight  infiltrate and nodularity seen in the substernal region.  This appears smaller and less prominent than on the previous study.  The left hilar infiltrative density on the previous study is smaller. There is a mildly osteopenic appearance of the bones.   IMPRESSION: There is continued infiltrative density seen within the medial segment of the right middle lobe with loss of definition of the margin of the right side of the cardiac silhouette on the PA image. On the lateral image there is slight infiltrate nodularity seen in the substernal region.  This appears smaller and less prominent than on the previous study.  The left hilar infiltrative density on the previous study is smaller.   Read By:  Crawford Givens,  M.D.    MISC. Report  Procedure date:  09/28/2009  Findings:      BMP (METABOL)   Sodium                    136 mEq/L                   135-145   Potassium                 4.4 mEq/L                   3.5-5.1   Chloride             [L]  94 mEq/L                    96-112   Carbon Dioxide            30 mEq/L                    19-32   Glucose              [H]  104 mg/dL                   04-54   BUN                       11 mg/dL                    0-98   Creatinine                0.6 mg/dL                   1.1-9.1   Calcium                   10.2 mg/dL  8.4-10.5   GFR                       106.38 mL/min               >60  CBC Platelet w/Diff (CBCD)   White Cell Count          7.8 K/uL                    4.5-10.5   Red Cell Count            3.97 Mil/uL                 3.87-5.11   Hemoglobin                13.8 g/dL                   16.1-09.6   Hematocrit                39.5 %                      36.0-46.0   MCV                       99.5 fl                     78.0-100.0   Platelet Count            272.0 K/uL                  150.0-400.0   Neutrophil %              63.4 %                      43.0-77.0   Lymphocyte %              25.4 %                       12.0-46.0   Monocyte %                9.9 %                       3.0-12.0   Eosinophils%              1.1 %                       0.0-5.0   Basophils %               0.2 %                       0.0-3.0  Sed Rate (ESR)   Sed Rate                  7 mm/hr                     0-22   Impression & Recommendations:  Problem # 1:  Hx of PNEUMONIA (ICD-486) She has persistant cough, constitutional symptoms in the wake of an atypical pneumonia which has knocked her for a loop... she is still very concerned> but CXR shows definite improvement (some XRay lag time), & labs are WNL.Marland Kitchen. we discussed CT Chest for completeness  and she likes this idea even though she is claustrophobic (rec to take 2 Xanax prior)... in the meanwhile> Rx w/ Depo80, Medrol 4mg - 5d taper, and Tussionex Qhs... Orders: Radiology Referral (Radiology) >> CT CHEST **pending** TLB-BMP (Basic Metabolic Panel-BMET) (80048-METABOL) TLB-CBC Platelet - w/Differential (85025-CBCD) TLB-Sedimentation Rate (ESR) (85652-ESR)  Problem # 2:  HYPERTENSION (ICD-401.9) Controlled>  same meds. Her updated medication list for this problem includes:    Norvasc 2.5 Mg Tabs (Amlodipine besylate) .Marland Kitchen... Take 1 tablet by mouth once a day  Problem # 3:  CORONARY ARTERY DISEASE (ICD-414.00) Stable>  no angina... due for f/u w/ drHochrein in June... Her updated medication list for this problem includes:    Adult Aspirin Ec Low Strength 81 Mg Tbec (Aspirin) .Marland Kitchen... Take 1 tablet by mouth once a day    Norvasc 2.5 Mg Tabs (Amlodipine besylate) .Marland Kitchen... Take 1 tablet by mouth once a day    Nitroglycerin 0.4 Mg Subl (Nitroglycerin) ..... One tablet under tongue every 5 minutes as needed for chest pain---may repeat times three  Problem # 4:  IRRITABLE BOWEL SYNDROME (ICD-564.1) GI is stable>  continue current meds...  Problem # 5:  FIBROMYALGIA (ICD-729.1) Stable>  this may be part of the reason this infection "knocked her for a loop"... Her updated  medication list for this problem includes:    Adult Aspirin Ec Low Strength 81 Mg Tbec (Aspirin) .Marland Kitchen... Take 1 tablet by mouth once a day    Flexeril 10 Mg Tabs (Cyclobenzaprine hcl) .Marland Kitchen... Take 1 tab by mouth three times a day as needed for muscle spasm...  Problem # 6:  ANXIETY (ICD-300.00) Aware>  continue meds... Her updated medication list for this problem includes:    Trazodone Hcl 100 Mg Tabs (Trazodone hcl) .Marland Kitchen... 1 tab by mouth at bedtime    Alprazolam 0.5 Mg Tabs (Alprazolam) .Marland Kitchen... Take 1/2 to 1 tab by mouth three times a day as directed...  Complete Medication List: 1)  Zyrtec Allergy 10 Mg Tabs (Cetirizine hcl) .... Take 1 tablet by mouth once a day 2)  Adult Aspirin Ec Low Strength 81 Mg Tbec (Aspirin) .... Take 1 tablet by mouth once a day 3)  Norvasc 2.5 Mg Tabs (Amlodipine besylate) .... Take 1 tablet by mouth once a day 4)  Nitroglycerin 0.4 Mg Subl (Nitroglycerin) .... One tablet under tongue every 5 minutes as needed for chest pain---may repeat times three 5)  Fish Oil Double Strength 1200 Mg Caps (Omega-3 fatty acids) .... Take one capsule by mouth two times a day 6)  Clidinium-chlordiazepoxide 2.5-5 Mg Caps (Clidinium-chlordiazepoxide) .... Take 1 cap by mouth three times a day as needed for abd cramping... 7)  Premarin 0.625 Mg/gm Crea (Estrogens, conjugated) .... Apply three times weekly 8)  Calcium 600 1500 Mg Tabs (Calcium carbonate) .... Take 4 tabs once daily 9)  Multivitamins Tabs (Multiple vitamin) .... Take 1 tablet by mouth once a day 10)  Trazodone Hcl 100 Mg Tabs (Trazodone hcl) .Marland Kitchen.. 1 tab by mouth at bedtime 11)  Alprazolam 0.5 Mg Tabs (Alprazolam) .... Take 1/2 to 1 tab by mouth three times a day as directed... 12)  Terconazole 0.4 % Crea (Terconazole) .... Use 1 times per week 13)  Flexeril 10 Mg Tabs (Cyclobenzaprine hcl) .... Take 1 tab by mouth three times a day as needed for muscle spasm... 14)  Glucosamine 500 Mg Caps (Glucosamine sulfate) .... Take 2  capsules daily 15)  Medrol 4 Mg Tabs (Methylprednisolone) .... Take 1 tab by mouth two  times a day x 5d, then 1 tab daily x5d, then 1/2 tab daily til gone... 16)  Tussionex Pennkinetic Er 8-10 Mg/41ml Lqcr (Chlorpheniramine-hydrocodone) .Marland Kitchen.. 1 tsp by mouth every 12 h as needed for cough...  Other Orders: Depo- Medrol 80mg  (J1040) Admin of Therapeutic Inj  intramuscular or subcutaneous (84696)  Patient Instructions: 1)  Today we updated your med list- see below.... 2)  Today we gave you a Depo shot & wote new perscriptions for Medrol to take as directed, & Tussionex to use as needed... 3)  We will sched a CT Chest for further evaluation of your persistant resp symptoms (& f/u blood work today prior to the scan).Marland KitchenMarland Kitchen 4)  We will call you w/ these results when avail.Marland KitchenMarland Kitchen 5)  Try taking 2 Alprazolam tabs prior to the CT for your clautrophobia... Prescriptions: TUSSIONEX PENNKINETIC ER 8-10 MG/5ML LQCR (CHLORPHENIRAMINE-HYDROCODONE) 1 tsp by mouth every 12 H as needed for cough...  #4 oz x 2   Entered and Authorized by:   Michele Mcalpine MD   Signed by:   Michele Mcalpine MD on 09/28/2009   Method used:   Print then Give to Patient   RxID:   [id number] MEDROL 4 MG TABS (METHYLPREDNISOLONE) take 1 tab by mouth two times a day x 5d, then 1 tab daily x5d, then 1/2 tab daily til gone...  #20 x 0   Entered and Authorized by:   Michele Mcalpine MD   Signed by:   Michele Mcalpine MD on 09/28/2009   Method used:   Print then Give to Patient   RxID:   [id number]    Medication Administration  Injection # 1:    Medication: Depo- Medrol 80mg     Diagnosis: BRONCHITIS, ACUTE (ICD-466.0)    Route: IM    Site: RUOQ gluteus    Exp Date: 03/2012    Lot #: [id number]    Mfr: [employer]    Patient tolerated injection without complications    Given by: Randell Loop CMA (Sep 28, 2009 4:06 PM)  Orders Added: 1)  Est. Patient Level IV [63875] 2)  Depo- Medrol 80mg  [J1040] 3)  Admin of Therapeutic Inj   intramuscular or subcutaneous [96372] 4)  Radiology Referral [Radiology] 5)  TLB-BMP (Basic Metabolic Panel-BMET) [80048-METABOL] 6)  TLB-CBC Platelet - w/Differential [85025-CBCD] 7)  TLB-Sedimentation Rate (ESR) [64332-RJJ]

## 2010-05-31 NOTE — Assessment & Plan Note (Signed)
Summary: 6 months/apc   Primary Care Provider:  Dr. Kriste Basque  CC:  6 month ROV & review of mult medical problems....  History of Present Illness: [name] here for a 6 month follow up visit... she has multiple medical problems as noted below...     ~  Feb10:  she has been under alot of stress w/ her daughter's divorce and 81 y/o grandaughter that she hasn't seen as much... increased anxiety issues, but the Alprazolam really helps... she notes appetite good, weight stable... notes some abd cramping w/ help from Librax in the past- we will refill Rx... recent labs all look good!  ~  Sep10:  presents c/o FM flair w/ plantar fasciitis pain + discomfort in right thigh area "pulled muscle" (she notes benefit from therapeutic massage)... stress w/ husb's back surg, etc... she saw DrHochrein 6/10 w/ palpit related to the stress & he agreed w/ Rebeca Allegra Rx, no caffeine, etc... we discussed stretching exercises, hot soaks, continue Trazadone, add Flexeril...   ~  July 19, 2009:  she's had a good 52mo- recent fasting labs all WNL... BP controlled on Norvasc;  occas palpit, stress related, & resolve spont or w/ Alpraz;  no CP/ SOB/ etc...  she had f/u DrGottsegen for GYN & she relayed a message asking to come off the Fosamax for drug holiday- OK...    Current Problem List:  Hx of SINUSITIS (ICD-473.9) - uses CLARITIN OTC & FLONASE as needed...  HYPERTENSION (ICD-401.9) - controlled on NORVASC 2.5mg /d... tol well... BP=118/70... denies HA, fatigue, visual changes, CP, palipit, dizziness, syncope, dyspnea, edema, etc...  CORONARY ARTERY DISEASE (ICD-414.00) & Hx of PALPITATIONS (ICD-785.1) - on ASA 81mg /d, and FISH OIL daily... hx coronary spasm w/ non-obstructive CAD on cath 1999 (20-30% diagonal branch LAD only)... sl anterobasal HK & EF=59%... yearly f/u DrHochrein 6/10 stable- no changes/ avoid caffeine... she reports some incr palpit w/ stress- Alpraz 0.5mg  helps...  DIVERTICULOSIS OF COLON (ICD-562.10),  IBS (ICD-564.1), & COLONIC POLYPS (ICD-211.3) -  ~  colonoscopy 12/03 by DrDBrodie showing divertics only... f/u planned 30yrs...  ~  eval 3/09 for hematochezia w/ Hg 13.5, Fe 103, repeat colon w/ 3mm polyp= hyperplastic + angiodysplasia... f/u planned 41yrs...  ~  Feb10: notes some increased abd cramping/ IBS- we will refill her LIBRAX for Prn use...  Hx of COLONIC ANGIODYSPLASIA (ICD-569.84) - as above.Marland Kitchen  BACK PAIN, LUMBAR (ICD-724.2) - S/P LLam L4-5 yrs ago... ortho eval 9/06 by DrApp[place] w/ MRI showing DDD... conservative rx...  FIBROMYALGIA (ICD-729.1) - resting OK w/ the TRAZADONE 100mg Qhs...  OSTEOPOROSIS (ICD-733.00) - on FOSAMAX w/ D since 3/06 (she insists on this Rx), +Calcium, +MVI...   ~  BMD 1/08 was improved w/ TScores -1.5 to -2.1 on FOSAMAX therapy...   ~  7/09:  she wants to discuss stopping Fosamax in favor of Vit K which she read in "[employer]: Personal" newsletter- mult questions answered... we will check Vit D level (normal= 63) and I rec that she continue the Fosamax, calcium supplements, multivit therapy...  ~  2/10:  f/u BMD showed TScores -1.3 in Spine, & -1.8 in left FemNeck = improved...   ~  3/11:  ready for a drug holiday after 74yrs on Rx>> OK to stop Alendronate now...  ANXIETY (ICD-300.00) - uses ALPRAZOLAM 0.5mg  Tid Prn...  ***HEALTH MAINTENANCE:  she notes brother had shingles this yr and we discussed the shingles vaccine- I rec that she get this shot at the health dept, esp in light of the  new rec's from [facility] re: family history of shingles... she had PNEUMOVAX in 2007... gets yearly FLU SHOT... GYN = DrGottsegen and he has her on Premarin Rx + vag cream...  OK for TDAP 3/11 OV.   Allergies: 1)  ! Codeine 2)  ! Erythromycin  Comments:  Nurse/Medical Assistant: The patient's medications and allergies were reviewed with the patient and were updated in the Medication and Allergy Lists.  Past History:  Past Medical History:  Hx of SINUSITIS  (ICD-473.9) HYPERTENSION (ICD-401.9) CORONARY ARTERY DISEASE (ICD-414.00) Hx of PALPITATIONS (ICD-785.1) DIVERTICULOSIS OF COLON (ICD-562.10) IRRITABLE BOWEL SYNDROME (ICD-564.1) COLONIC POLYPS (ICD-211.3) Hx of COLONIC ANGIODYSPLASIA (ICD-569.84) BACK PAIN, LUMBAR (ICD-724.2) FIBROMYALGIA (ICD-729.1) OSTEOPOROSIS (ICD-733.00) ANXIETY (ICD-300.00)   1. Coronary spasm with nonobstructive coronary disease.  2. Anxiety.  3. Palpitaitons  4. Hypertension  Past Surgical History: S/P T&A as a child Cholecystectomy - Lap Chole 11/04 S/P LLam  Family History: Reviewed history and no changes required.  Social History: Reviewed history from 09/24/2008 and no changes required. Tobacco Use - Former.  Alcohol Use - yes  Review of Systems      See HPI  The patient denies anorexia, fever, weight loss, weight gain, vision loss, decreased hearing, hoarseness, chest pain, syncope, dyspnea on exertion, peripheral edema, prolonged cough, headaches, hemoptysis, abdominal pain, melena, hematochezia, severe indigestion/heartburn, hematuria, incontinence, muscle weakness, suspicious skin lesions, transient blindness, difficulty walking, depression, unusual weight change, abnormal bleeding, enlarged lymph nodes, and angioedema.    Vital Signs:  Patient profile:   75 year old female Height:      64 inches Weight:      101.25 pounds BMI:     17.44 O2 Sat:      100 % on Room air Temp:     97.0 degrees F oral Pulse rate:   69 / minute BP sitting:   118 / 70  (left arm) Cuff size:   regular  Vitals Entered By: Randell Loop CMA (July 19, 2009 11:07 AM)  O2 Sat at Rest %:  100 O2 Flow:  Room air CC: 6 month ROV & review of mult medical problems... Is Patient Diabetic? No Pain Assessment Patient in pain? no      Comments MEDS UPDATED TODAY   Physical Exam  Additional Exam:  WD, WN, [name] in NAD... GENERAL:  Alert & oriented; pleasant & cooperative... HEENT:  [place]/AT, EOM-wnl,  PERRLA, EACs-clear, TMs-wnl, NOSE-clear, THROAT-clear & wnl. NECK:  Supple w/ full ROM; no JVD; normal carotid impulses w/o bruits; no thyromegaly or nodules palpated; no lymphadenopathy. CHEST:  Clear to P & A; without wheezes/ rales/ or rhonchi. HEART:  Regular Rhythm; without murmurs/ rubs/ or gallops. ABDOMEN:  Soft & nontender; normal bowel sounds; no organomegaly or masses detected. EXT: without deformities, mild arthritic changes; no varicose veins/ venous insuffic/ or edema. Mult trigger points c/w FM... NEURO:  CN's intact; motor testing normal; sensory testing normal; gait normal & balance OK. DERM:  No lesions noted; no rash etc...    MISC. Report  Procedure date:  07/02/2009  Findings:      Lipid Panel (LIPID)   Cholesterol               196 mg/dL                   1-610   Triglycerides             76.0 mg/dL  0.0-149.0   HDL                       161.09 mg/dL                >60.45   LDL Cholesterol           78 mg/dL                    4-09  BMP (METABOL)   Sodium                    137 mEq/L                   135-145   Potassium                 3.8 mEq/L                   3.5-5.1   Chloride                  100 mEq/L                   96-112   Carbon Dioxide            28 mEq/L                    19-32   Glucose                   82 mg/dL                    81-19   BUN                       11 mg/dL                    1-47   Creatinine                0.7 mg/dL                   8.2-9.5   Calcium                   9.9 mg/dL                   6.2-13.0   GFR                       87.39 mL/min                >60  Hepatic/Liver Function Panel (HEPATIC)   Total Bilirubin           0.9 mg/dL                   8.6-5.7   Direct Bilirubin          0.1 mg/dL                   8.4-6.9   Alkaline Phosphatase      54 U/L                      39-117   AST                       23 U/L  0-37   ALT                       21 U/L                       0-35   Total Protein             7.4 g/dL                    1.6-1.0   Albumin                   4.2 g/dL                    9.6-0.4  Comments:      CBC Platelet w/Diff (CBCD)   White Cell Count          6.1 K/uL                    4.5-10.5   Red Cell Count            4.27 Mil/uL                 3.87-5.11   Hemoglobin                14.5 g/dL                   54.0-98.1   Hematocrit                44.2 %                      36.0-46.0   MCV                  [H]  103.5 fl                    78.0-100.0   Platelet Count            253.0 K/uL                  150.0-400.0   Neutrophil %              54.0 %                      43.0-77.0   Lymphocyte %              32.9 %                      12.0-46.0   Monocyte %                10.8 %                      3.0-12.0   Eosinophils%              1.8 %                       0.0-5.0   Basophils %               0.5 %                       0.0-3.0   TSH (TSH)   FastTSH  1.77 uIU/mL                 0.35-5.50   Impression & Recommendations:  Problem # 1:  HYPERTENSION (ICD-401.9) BP controlled on low dose Norvasc... continue Rx. Her updated medication list for this problem includes:    Norvasc 2.5 Mg Tabs (Amlodipine besylate) .Marland Kitchen... Take 1 tablet by mouth once a day  Problem # 2:  CORONARY ARTERY DISEASE (ICD-414.00) Stable-  no angina, due for f/u w/ DrHochrein soon... Her updated medication list for this problem includes:    Adult Aspirin Ec Low Strength 81 Mg Tbec (Aspirin) .Marland Kitchen... Take 1 tablet by mouth once a day    Norvasc 2.5 Mg Tabs (Amlodipine besylate) .Marland Kitchen... Take 1 tablet by mouth once a day    Nitroglycerin 0.4 Mg Subl (Nitroglycerin) ..... One tablet under tongue every 5 minutes as needed for chest pain---may repeat times three  Problem # 3:  COLONIC POLYPS (ICD-211.3) She is stable from the GI standpoint...  Problem # 4:  BACK PAIN, LUMBAR (ICD-724.2) She is stable from the Ortho standpoint... continue exercise  & massage... Her updated medication list for this problem includes:    Adult Aspirin Ec Low Strength 81 Mg Tbec (Aspirin) .Marland Kitchen... Take 1 tablet by mouth once a day    Flexeril 10 Mg Tabs (Cyclobenzaprine hcl) .Marland Kitchen... Take 1 tab by mouth three times a day as needed for muscle spasm...  Problem # 5:  OSTEOPOROSIS (ICD-733.00) We decided to stop Fosamax now... f/u BMD due in 53yr... The following medications were removed from the medication list:    Fosamax Plus D 70-5600 Mg-unit Tabs (Alendronate-cholecalciferol) .Marland Kitchen... Take 1 tab by mouth each week...  Problem # 6:  ANXIETY (ICD-300.00) The Alprazolam helps... she prefers to take Prn only... Her updated medication list for this problem includes:    Trazodone Hcl 100 Mg Tabs (Trazodone hcl) .Marland Kitchen... 1 tab by mouth at bedtime    Alprazolam 0.5 Mg Tabs (Alprazolam) .Marland Kitchen... Take 1/2 to 1 tab by mouth three times a day as directed...  Problem # 7:  OTHER MEDICAL PROBLEMS AS NOTED>>> OK TDAP today...  Complete Medication List: 1)  Claritin 10 Mg Caps (Loratadine) .... As needed 2)  Fluticasone Propionate 50 Mcg/act Susp (Fluticasone propionate) .... 2 sprays in each nostril two times a day... 3)  Adult Aspirin Ec Low Strength 81 Mg Tbec (Aspirin) .... Take 1 tablet by mouth once a day 4)  Norvasc 2.5 Mg Tabs (Amlodipine besylate) .... Take 1 tablet by mouth once a day 5)  Nitroglycerin 0.4 Mg Subl (Nitroglycerin) .... One tablet under tongue every 5 minutes as needed for chest pain---may repeat times three 6)  Fish Oil Double Strength 1200 Mg Caps (Omega-3 fatty acids) .... Take one capsule by mouth two times a day 7)  Clidinium-chlordiazepoxide 2.5-5 Mg Caps (Clidinium-chlordiazepoxide) .... Take 1 cap by mouth three times a day as needed for abd cramping... 8)  Premarin 0.625 Mg/gm Crea (Estrogens, conjugated) .... Apply three times weekly 9)  Calcium 600 1500 Mg Tabs (Calcium carbonate) .... Take 4 tabs once daily 10)  Multivitamins Tabs (Multiple  vitamin) .... Take 1 tablet by mouth once a day 11)  Trazodone Hcl 100 Mg Tabs (Trazodone hcl) .Marland Kitchen.. 1 tab by mouth at bedtime 12)  Alprazolam 0.5 Mg Tabs (Alprazolam) .... Take 1/2 to 1 tab by mouth three times a day as directed... 13)  Terconazole 0.4 % Crea (Terconazole) .... Use 1 times per week 14)  Flexeril 10  Mg Tabs (Cyclobenzaprine hcl) .... Take 1 tab by mouth three times a day as needed for muscle spasm... 15)  Glucosamine 500 Mg Caps (Glucosamine sulfate) .... Take 2 capsules daily  Other Orders: Prescription Created Electronically [phone]) Tdap => 32yrs IM (60454) Admin 1st Vaccine (09811)  Patient Instructions: 1)  Today we updated your med list- see below.... 2)  We decided to STOP the Fosamax/ Alendronate for a "drug holiday"... we will recheck your bone density 1 yr from now... 3)  Today we gave you the combination Tetanus vaccine called the TDAP... good for 10 yrs. 4)  We reviewed your recent lab data- FANTASTIC!!! 5)  Call for any problems.Marland KitchenMarland Kitchen 6)  Please schedule a follow-up appointment in 6 months. Prescriptions: ALPRAZOLAM 0.5 MG  TABS (ALPRAZOLAM) take 1/2 to 1 tab by mouth three times a day as directed...  #[phone] x 6   Entered and Authorized by:   Michele Mcalpine MD   Signed by:   Michele Mcalpine MD on 07/19/2009   Method used:   Print then Give to Patient   RxID:   [id number]    Immunizations Administered:  Tetanus Vaccine:    Vaccine Type: Tdap    Site: left deltoid    Mfr: BOOSTRIX    Dose: 0.5 ml    Route: IM    Given by: Randell Loop CMA    Exp. Date: 07/24/2011    Lot #: [id number]    VIS given: 03/19/07 version given July 19, 2009.

## 2010-05-31 NOTE — Assessment & Plan Note (Signed)
Summary: 12 month rov f/u palps  pfh  Medications Added FOSAMAX PLUS D 70-2800 MG-UNIT TABS (ALENDRONATE-CHOLECALCIFEROL) 1 by mouth weekly PREMARIN 0.625 MG/GM CREA (ESTROGENS, CONJUGATED) 3 times a week      Allergies Added:   Visit Type:  Follow-up Primary Provider:  Dr. Kriste Basque  CC:  palpitations.  History of Present Illness: The patient presents for yearly followup. She continues to have occasional palpitations. He still has a lot of stress in her family related to her daughter who is going through a divorce. She also has had a recent pneumonia. With this she may have had some more palpitations but she's had no presyncope or syncope. She has been more limited in activities because of her pneumonia. She is not describing any severe shortness of breath though is a little more difficult to take a deep breath. She does have a dry nonproductive cough. She has a little chest soreness with deep breathing but no anginal type symptoms.  Current Medications (verified): 1)  Zyrtec Allergy 10 Mg Tabs (Cetirizine Hcl) .... Take 1 Tablet By Mouth Once A Day 2)  Adult Aspirin Ec Low Strength 81 Mg  Tbec (Aspirin) .... Take 1 Tablet By Mouth Once A Day 3)  Norvasc 2.5 Mg  Tabs (Amlodipine Besylate) .... Take 1 Tablet By Mouth Once A Day 4)  Nitroglycerin 0.4 Mg Subl (Nitroglycerin) .... One Tablet Under Tongue Every 5 Minutes As Needed For Chest Pain---May Repeat Times Three 5)  Clidinium-Chlordiazepoxide 2.5-5 Mg Caps (Clidinium-Chlordiazepoxide) .... Take 1 Cap By Mouth Three Times A Day As Needed For Abd Cramping... 6)  Premarin 0.625 Mg/gm  Crea (Estrogens, Conjugated) .... Apply Three Times Weekly 7)  Calcium 600 1500 Mg  Tabs (Calcium Carbonate) .... Take 4 Tabs Once Daily 8)  Multivitamins   Tabs (Multiple Vitamin) .... Take 1 Tablet By Mouth Once A Day 9)  Trazodone Hcl 100 Mg  Tabs (Trazodone Hcl) .Marland Kitchen.. 1 Tab By Mouth At Bedtime 10)  Alprazolam 0.5 Mg  Tabs (Alprazolam) .... Take 1/2 To 1 Tab  By Mouth Three Times A Day As Directed... 11)  Terconazole 0.4 % Crea (Terconazole) .... Use 1 Times Per Week 12)  Flexeril 10 Mg Tabs (Cyclobenzaprine Hcl) .... Take 1 Tab By Mouth Three Times A Day As Needed For Muscle Spasm... 13)  Medrol 4 Mg Tabs (Methylprednisolone) .... Take 1 Tab By Mouth Two Times A Day X 5d, Then 1 Tab Daily X5d, Then 1/2 Tab Daily Til Gone... 14)  Tussionex Pennkinetic Er 8-10 Mg/37ml Lqcr (Chlorpheniramine-Hydrocodone) .Marland Kitchen.. 1 Tsp By Mouth Every 12 H As Needed For Cough... 15)  Fosamax Plus D 70-2800 Mg-Unit Tabs (Alendronate-Cholecalciferol) .Marland Kitchen.. 1 By Mouth Weekly 16)  Premarin 0.625 Mg/gm Crea (Estrogens, Conjugated) .... 3 Times A Week  Allergies (verified): 1)  ! Codeine 2)  ! Erythromycin  Past History:  Past Medical History: Reviewed history from 09/28/2009 and no changes required. Hx of SINUSITIS (ICD-473.9) Hx of PNEUMONIA (ICD-486) HYPERTENSION (ICD-401.9) CORONARY ARTERY DISEASE (ICD-414.00) Hx of PALPITATIONS (ICD-785.1) DIVERTICULOSIS OF COLON (ICD-562.10) IRRITABLE BOWEL SYNDROME (ICD-564.1) COLONIC POLYPS (ICD-211.3) Hx of COLONIC ANGIODYSPLASIA (ICD-569.84) BACK PAIN, LUMBAR (ICD-724.2) FIBROMYALGIA (ICD-729.1) OSTEOPOROSIS (ICD-733.00) ANXIETY (ICD-300.00)  Past Surgical History: Reviewed history from 09/28/2009 and no changes required. S/P T&A as a child Cholecystectomy - Lap Chole 11/04 S/P LLam  Review of Systems       As stated in the HPI and negative for all other systems.   Vital Signs:  Patient profile:   75 year old  female Height:      64 inches Weight:      02 pounds BMI:     0.34 Pulse rate:   81 / minute Resp:     16 per minute BP sitting:   146 / 76  (right arm)  Vitals Entered By: Marrion Coy, CNA (September 30, 2009 3:05 PM)  Physical Exam  General:  Well developed, well nourished, in no acute distress. Head:  normocephalic and atraumatic Eyes:  PERRLA/EOM intact; conjunctiva and lids normal. Mouth:  Teeth,  gums and palate normal. Oral mucosa normal. Neck:  Neck supple, no JVD. No masses, thyromegaly or abnormal cervical nodes. Chest Wall:  no deformities or breast masses noted Lungs:  Clear bilaterally to auscultation and percussion. Abdomen:  Bowel sounds positive; abdomen soft and non-tender without masses, organomegaly, or hernias noted. No hepatosplenomegaly. Msk:  Back normal, normal gait. Muscle strength and tone normal. Extremities:  No clubbing or cyanosis. Neurologic:  Alert and oriented x 3. Skin:  Intact without lesions or rashes. Psych:  Normal affect.   Detailed Cardiovascular Exam  Neck    Carotids: Carotids full and equal bilaterally without bruits.      Neck Veins: Normal, no JVD.    Heart    Inspection: no deformities or lifts noted.      Palpation: normal PMI with no thrills palpable.      Auscultation: regular rate and rhythm, S1, S2 without murmurs, rubs, gallops, or clicks.    Vascular    Abdominal Aorta: no palpable masses, pulsations, or audible bruits.      Femoral Pulses: normal femoral pulses bilaterally.      Pedal Pulses: normal pedal pulses bilaterally.      Radial Pulses: normal radial pulses bilaterally.      Peripheral Circulation: no clubbing, cyanosis, or edema noted with normal capillary refill.     EKG  Procedure date:  09/30/2009  Findings:      sinus rhythm, rate 81, left axis deviation, left atrial enlargement, poor anterior R-wave progression, no acute ST-T wave changes  Impression & Recommendations:  Problem # 1:  HYPERTENSION (ICD-401.9) Her blood pressure has actually been elevated the last couple of readings. However, she is now taking a steroid. It was never elevated before and in fact was low. She'll keep a blood pressure diary at home and we will see over the weeks ago whether this is a trend that needs to be treated.  Problem # 2:  Hx of PALPITATIONS (ICD-785.1) These seem to be baseline and I would suggest no change in therapy  or further evaluation. Orders: EKG w/ Interpretation (93000)  Patient Instructions: 1)  Your physician recommends that you schedule a follow-up appointment in: 12 months with Dr Antoine Poche 2)  Your physician recommends that you continue on your current medications as directed. Please refer to the Current Medication list given to you today.

## 2010-05-31 NOTE — Assessment & Plan Note (Signed)
Summary: Acute NP office visit - bronchitis   Primary Provider/Referring Provider:  Dr. Kriste Basque  CC:  low grade temp, discomfort/irritation in airway, HA, bright red nasal discharge, dry raspy cough, and chest congestion x5days - pt wonders if she needs cxr.  History of Present Illness: [name] here for a 6 month follow up visit... she has multiple medical problems as noted below...      ~  July 19, 2009:  she's had a good 10mo- recent fasting labs all WNL... BP controlled on Norvasc;  occas palpit, stress related, & resolve spont or w/ Alpraz;  no CP/ SOB/ etc...  she had f/u DrGottsegen for GYN & she relayed a message asking to come off the Fosamax for drug holiday- OK...  August 06, 2009--Presents for an acute office visit. Complains of increased SOB, sore throat w/ PND, dry cough, chest congestion, pain under shoulder on right side, temp up to 100.8, body aches x5days - has been taking hydromet, mucinex has 2 doses left of zpak. Husband with similar symptoms. Cough, congestion are no better. Throat is very sore. She does not feel good, has lots of body aches.   August 23, 2009 --Pt presents for persistent symptoms. Complains of cough, congestion , draiange and low grade fevers. Seen 2 weeks ago, tx for URI w/ zpack and steroid taper. Got better but 1 week after finishing meds cough restarted. Denies chest pain, dyspnea, orthopnea, hemoptysis, fever, n/v/d, edema, headache, recent travel, calf pain. She is using netti pot. Has some drainage, mainly clear. Chest congestion mainly thick hard to get up, using mucinex dm . Labs last month essentially unremarkable w/ cbc/bmet/tsh.       Medications Prior to Update: 1)  Claritin 10 Mg Caps (Loratadine) .... As Needed 2)  Fluticasone Propionate 50 Mcg/act Susp (Fluticasone Propionate) .... 2 Sprays in Each Nostril Two Times A Day... 3)  Adult Aspirin Ec Low Strength 81 Mg  Tbec (Aspirin) .... Take 1 Tablet By Mouth Once A Day 4)  Norvasc 2.5 Mg   Tabs (Amlodipine Besylate) .... Take 1 Tablet By Mouth Once A Day 5)  Nitroglycerin 0.4 Mg Subl (Nitroglycerin) .... One Tablet Under Tongue Every 5 Minutes As Needed For Chest Pain---May Repeat Times Three 6)  Fish Oil Double Strength 1200 Mg  Caps (Omega-3 Fatty Acids) .... Take One Capsule By Mouth Two Times A Day 7)  Clidinium-Chlordiazepoxide 2.5-5 Mg Caps (Clidinium-Chlordiazepoxide) .... Take 1 Cap By Mouth Three Times A Day As Needed For Abd Cramping... 8)  Premarin 0.625 Mg/gm  Crea (Estrogens, Conjugated) .... Apply Three Times Weekly 9)  Calcium 600 1500 Mg  Tabs (Calcium Carbonate) .... Take 4 Tabs Once Daily 10)  Multivitamins   Tabs (Multiple Vitamin) .... Take 1 Tablet By Mouth Once A Day 11)  Trazodone Hcl 100 Mg  Tabs (Trazodone Hcl) .Marland Kitchen.. 1 Tab By Mouth At Bedtime 12)  Alprazolam 0.5 Mg  Tabs (Alprazolam) .... Take 1/2 To 1 Tab By Mouth Three Times A Day As Directed... 13)  Terconazole 0.4 % Crea (Terconazole) .... Use 1 Times Per Week 14)  Flexeril 10 Mg Tabs (Cyclobenzaprine Hcl) .... Take 1 Tab By Mouth Three Times A Day As Needed For Muscle Spasm... 15)  Glucosamine 500 Mg Caps (Glucosamine Sulfate) .... Take 2 Capsules Daily 16)  Zithromax Z-Pak 250 Mg Tabs (Azithromycin) .... Take As Directed 17)  Hydromet 5-1.5 Mg/45ml Syrp (Hydrocodone-Homatropine) .Marland Kitchen.. 1-2 Tsp Every 4-6 Hr As Needed Cough 18)  Prednisone 10 Mg  Tabs (Prednisone) .... 4 Tabs For 2 Days, Then 3 Tabs For 2 Days, 2 Tabs For 2 Days, Then 1 Tab For 2 Days, Then Stop  Current Medications (verified): 1)  Zyrtec Allergy 10 Mg Tabs (Cetirizine Hcl) .... Take 1 Tablet By Mouth Once A Day 2)  Adult Aspirin Ec Low Strength 81 Mg  Tbec (Aspirin) .... Take 1 Tablet By Mouth Once A Day 3)  Norvasc 2.5 Mg  Tabs (Amlodipine Besylate) .... Take 1 Tablet By Mouth Once A Day 4)  Nitroglycerin 0.4 Mg Subl (Nitroglycerin) .... One Tablet Under Tongue Every 5 Minutes As Needed For Chest Pain---May Repeat Times Three 5)  Fish Oil  Double Strength 1200 Mg  Caps (Omega-3 Fatty Acids) .... Take One Capsule By Mouth Two Times A Day 6)  Clidinium-Chlordiazepoxide 2.5-5 Mg Caps (Clidinium-Chlordiazepoxide) .... Take 1 Cap By Mouth Three Times A Day As Needed For Abd Cramping... 7)  Premarin 0.625 Mg/gm  Crea (Estrogens, Conjugated) .... Apply Three Times Weekly 8)  Calcium 600 1500 Mg  Tabs (Calcium Carbonate) .... Take 4 Tabs Once Daily 9)  Multivitamins   Tabs (Multiple Vitamin) .... Take 1 Tablet By Mouth Once A Day 10)  Trazodone Hcl 100 Mg  Tabs (Trazodone Hcl) .Marland Kitchen.. 1 Tab By Mouth At Bedtime 11)  Alprazolam 0.5 Mg  Tabs (Alprazolam) .... Take 1/2 To 1 Tab By Mouth Three Times A Day As Directed... 12)  Terconazole 0.4 % Crea (Terconazole) .... Use 1 Times Per Week 13)  Flexeril 10 Mg Tabs (Cyclobenzaprine Hcl) .... Take 1 Tab By Mouth Three Times A Day As Needed For Muscle Spasm... 14)  Glucosamine 500 Mg Caps (Glucosamine Sulfate) .... Take 2 Capsules Daily 15)  Hydromet 5-1.5 Mg/31ml Syrp (Hydrocodone-Homatropine) .Marland Kitchen.. 1-2 Tsp Every 4-6 Hr As Needed Cough  Allergies (verified): 1)  ! Codeine 2)  ! Erythromycin  Past History:  Past Medical History: Last updated: 07/19/2009  Hx of SINUSITIS (ICD-473.9) HYPERTENSION (ICD-401.9) CORONARY ARTERY DISEASE (ICD-414.00) Hx of PALPITATIONS (ICD-785.1) DIVERTICULOSIS OF COLON (ICD-562.10) IRRITABLE BOWEL SYNDROME (ICD-564.1) COLONIC POLYPS (ICD-211.3) Hx of COLONIC ANGIODYSPLASIA (ICD-569.84) BACK PAIN, LUMBAR (ICD-724.2) FIBROMYALGIA (ICD-729.1) OSTEOPOROSIS (ICD-733.00) ANXIETY (ICD-300.00)   1. Coronary spasm with nonobstructive coronary disease.  2. Anxiety.  3. Palpitaitons  4. Hypertension  Past Surgical History: Last updated: 07/19/2009 S/P T&A as a child Cholecystectomy - Lap Chole 11/04 S/P LLam  Family History: Last updated: 08/06/2009 emphysema/COPD - maternal aunts (2) allergies - MGF asthma - MGF heart disease - father with CHF rheumatism -  mother stroke - mother  Social History: Last updated: 08/06/2009 former smoker, quit 1980 - x58yrs, 1/2ppd  Alcohol Use - yes married 1 children retired: taught preschool, Diplomatic Services operational officer  Risk Factors: Smoking Status: quit (09/24/2008)  Review of Systems      See HPI  Vital Signs:  Patient profile:   75 year old female Height:      64 inches Weight:      104 pounds BMI:     17.92 O2 Sat:      97 % on Room air Temp:     97.7 degrees F oral Pulse rate:   90 / minute BP sitting:   110 / 88  (left arm) Cuff size:   regular  Vitals Entered By: Boone Master CNA (August 23, 2009 2:34 PM)  O2 Flow:  Room air CC: low grade temp, discomfort/irritation in airway, HA, bright red nasal discharge, dry raspy cough, chest congestion x5days - pt wonders if she needs cxr  Is Patient Diabetic? No Comments Medications reviewed with patient Daytime contact number verified with patient. Boone Master CNA  August 23, 2009 2:34 PM    Physical Exam  Additional Exam:  WD, WN, [name] in NAD... GENERAL:  Alert & oriented; pleasant & cooperative... HEENT:  [place]/AT, EOM-wnl, PERRLA, EACs-clear, TMs-wnl, NOSE-clear discharge,  THROAT-clear & wnl. NECK:  Supple w/ full ROM; no JVD; normal carotid impulses w/o bruits; no thyromegaly or nodules palpated; no lymphadenopathy. CHEST:  Clear to P & A; without wheezes/ rales/ or rhonchi., barking cough HEART:  Regular Rhythm; without murmurs/ rubs/ or gallops. ABDOMEN:  Soft & nontender; normal bowel sounds; no organomegaly or masses detected. EXT: without deformities, mild arthritic changes; no varicose veins/ venous insuffic/ or edema.     Impression & Recommendations:  Problem # 1:  PNEUMONIA (ICD-486) XRay shows RML consolidation c/w PNA Tx w/ Avelox x 7 days follow up 2 weeks w/ CXR    Medications Added to Medication List This Visit: 1)  Zyrtec Allergy 10 Mg Tabs (Cetirizine hcl) .... Take 1 tablet by mouth once a day 2)  Avelox 400 Mg Tabs  (Moxifloxacin hcl) .Marland Kitchen.. 1 by mouth once daily  Complete Medication List: 1)  Zyrtec Allergy 10 Mg Tabs (Cetirizine hcl) .... Take 1 tablet by mouth once a day 2)  Adult Aspirin Ec Low Strength 81 Mg Tbec (Aspirin) .... Take 1 tablet by mouth once a day 3)  Norvasc 2.5 Mg Tabs (Amlodipine besylate) .... Take 1 tablet by mouth once a day 4)  Nitroglycerin 0.4 Mg Subl (Nitroglycerin) .... One tablet under tongue every 5 minutes as needed for chest pain---may repeat times three 5)  Fish Oil Double Strength 1200 Mg Caps (Omega-3 fatty acids) .... Take one capsule by mouth two times a day 6)  Clidinium-chlordiazepoxide 2.5-5 Mg Caps (Clidinium-chlordiazepoxide) .... Take 1 cap by mouth three times a day as needed for abd cramping... 7)  Premarin 0.625 Mg/gm Crea (Estrogens, conjugated) .... Apply three times weekly 8)  Calcium 600 1500 Mg Tabs (Calcium carbonate) .... Take 4 tabs once daily 9)  Multivitamins Tabs (Multiple vitamin) .... Take 1 tablet by mouth once a day 10)  Trazodone Hcl 100 Mg Tabs (Trazodone hcl) .Marland Kitchen.. 1 tab by mouth at bedtime 11)  Alprazolam 0.5 Mg Tabs (Alprazolam) .... Take 1/2 to 1 tab by mouth three times a day as directed... 12)  Terconazole 0.4 % Crea (Terconazole) .... Use 1 times per week 13)  Flexeril 10 Mg Tabs (Cyclobenzaprine hcl) .... Take 1 tab by mouth three times a day as needed for muscle spasm... 14)  Glucosamine 500 Mg Caps (Glucosamine sulfate) .... Take 2 capsules daily 15)  Hydromet 5-1.5 Mg/47ml Syrp (Hydrocodone-homatropine) .Marland Kitchen.. 1-2 tsp every 4-6 hr as needed cough 16)  Avelox 400 Mg Tabs (Moxifloxacin hcl) .Marland Kitchen.. 1 by mouth once daily  Other Orders: T-2 View CXR (71020TC) Est. Patient Level IV (84132) Prescription Created Electronically [id number])  Patient Instructions: 1)  Avelox 400mg  once daily w/ food for 7 days 2)  Mucinex DM two times a day as needed cough/congestion 3)  Fluids, rest and tylenol as needed  4)  Please contact office for sooner  follow up if symptoms do not improve or worsen  5)  follow up  2 weeks w/ chest xray for pneumonia 6)  Hold fish oil. Prescriptions: AVELOX 400 MG TABS (MOXIFLOXACIN HCL) 1 by mouth once daily  #7 x 0   Entered and Authorized by:   Khalie Wince  Gelisa Tieken NP   Signed by:   Rubye Oaks NP on 08/23/2009   Method used:   Electronically to        [facility]       [address]       [place], [place]  [address]       Ph: [phone]       Fax: [phone]   RxID:   [id number]

## 2010-05-31 NOTE — Progress Notes (Signed)
Summary: coughing  Phone Note Call from Patient Call back at Home Phone [phone]   Caller: Patient Call For: tammy parrett Summary of Call: pt has coughed "all morning". has been taking musinex as well as her other meds. please advise. NOTE was seen by tp last week.  Initial call taken by: Tivis Ringer, CNA,  Aug 30, 2009 12:03 PM  Follow-up for Phone Call        pt states she finsihed Avelox course last night. she is feeling better except for having coughing spasms though out the day. She has hydromet and this helps the coughing spells, but the med makes her sleepy and she can't take it during the day. Pt wants to know is the coughing spells to be expected, is there something else she can take during the day for cough that wont make her sleepy, and should she continue mucinex until f/u appt on 09/08/09. Please advise.Carron Curie CMA  Aug 30, 2009 12:48 PM   Additional Follow-up for Phone Call Additional follow up Details #1::        can add tessalon three times a day for cough ,  (Tessalon 200mg  1 by mouth three times a day as needed cough , #30, 1 refill) along w/ mucinex dm two times a day for cough w/ using hydomet as needed  if this is not working , cll back will consider another steroid taper.  ov if needed.  Please contact office for sooner follow up if symptoms do not improve or worsen  Additional Follow-up by: Rubye Oaks NP,  Aug 30, 2009 2:18 PM    Additional Follow-up for Phone Call Additional follow up Details #2::    pt advised of recs. rx [facility] to [facility]. Carron Curie CMA  Aug 30, 2009 2:26 PM   New/Updated Medications: TESSALON 200 MG CAPS (BENZONATATE) Take 1 capsule by mouth three times a day as needed cough Prescriptions: TESSALON 200 MG CAPS (BENZONATATE) Take 1 capsule by mouth three times a day as needed cough  #30 x 1   Entered by:   Carron Curie CMA   Authorized by:   Rubye Oaks NP   Signed by:   Carron Curie CMA on  08/30/2009   Method used:   Electronically to        [facility]       [address]       [place], [place]  [address]       Ph: [phone]       Fax: [phone]   RxID:   [id number]

## 2010-05-31 NOTE — Progress Notes (Signed)
Summary: appt-lmtcb  Phone Note Call from Patient   Caller: Patient Call For: nadel Summary of Call: Saw TP on 4/8 still not better cough, low grade fever, achy all over x4 wks. would like an appt today pls advise. Initial call taken by: Darletta Moll,  August 23, 2009 9:48 AM  Follow-up for Phone Call        [facility]Carron Curie CMA  August 23, 2009 10:13 AM  pt scheduled to see tp today at 2:30  Follow-up by: Philipp Deputy CMA,  August 23, 2009 11:23 AM

## 2010-05-31 NOTE — Assessment & Plan Note (Signed)
Summary: rov 6 months///kp   Primary Care Provider:  Dr. Kriste Basque  CC:  3-4 month ROV & review....  History of Present Illness: [name] here for a 6 month follow up visit... she has multiple medical problems as noted below...     ~  Feb10:  she has been under alot of stress w/ her daughter's divorce and 45 y/o grandaughter that she hasn't seen as much... increased anxiety issues, but the Alprazolam really helps... she notes appetite good, weight stable... notes some abd cramping w/ help from Librax in the past- we will refill Rx... recent labs all look good!  ~  Sep10:  presents c/o FM flair w/ plantar fasciitis pain + discomfort in right thigh area "pulled muscle" (she notes benefit from therapeutic massage)... stress w/ husb's back surg, etc... she saw DrHochrein 6/10 w/ palpit related to the stress & he agreed w/ Rebeca Allegra Rx, no caffeine, etc... we discussed stretching exercises, hot soaks, continue Trazadone, add Flexeril...   ~  Mar11:  she's had a good 63mo- recent fasting labs all WNL... BP controlled on Norvasc;  occas palpit, stress related, & resolve spont or w/ Alpraz;  no CP/ SOB/ etc...  she had f/u DrGottsegen for GYN & she relayed a message asking to come off the Fosamax for drug holiday- OK...   ~  Sep 28, 2009:  she had a resp illness w/ marked constitutional symptoms 4/11 & saw TP on several occas w/ Zpak, Avelox, Steroid taper, Mucinex, Hydromet, Tessalon... still feels crummy w/ dry cough, hacking, and several times mentioned that she has never been this sick... CXR's reviewed> 4/25 w/ RML pneumonia;  5/11 showed improved RML & new lingular opac;  5/31 showed improvement... we discussed checking labs (CBC, Sed, BMet= normal), and CT Chest for completeness (mucoid impaction, atx, scarring)... Rx w/ Depo80, Medrol 5dtaper, Tussionex Prn, +MUCINEX & Fluids.   ~  January 17, 2010:  above resp illness finally resolved & she is back to baseline "everything is 100%"...  BP well controlled  on low dose Amlodipine;  she had f/u DrHochrein 6/11- stable & denies CP, palpit, SOB, edema, etc;  GI is stable & Librax refilled;  FM is stable w/ her Trazadone & Flexeril;  she had Shingles left arm per DrHouston... she requests refill perscriptions & will get the Flu vaccine at [facility] volunteer dept...   Current Problem List:  Hx of SINUSITIS (ICD-473.9) - uses ZYRTEK OTC & FLONASE as needed... she notes that her allergies are bothering her recently.  Hx of PNEUMONIA (ICD-486) - ** SEE ABOVE **  HYPERTENSION (ICD-401.9) - controlled on NORVASC 2.5mg /d... tol well... BP=120/60... denies HA, fatigue, visual changes, CP, palipit, dizziness, syncope, dyspnea, edema, etc...  CORONARY ARTERY DISEASE (ICD-414.00) & Hx of PALPITATIONS (ICD-785.1) - on ASA 81mg /d, and FISH OIL daily... hx coronary spasm w/ non-obstructive CAD on cath 1999 (20-30% diagonal branch LAD only)... sl anterobasal HK & EF=59%... yearly f/u DrHochrein 6/10 stable- no changes/ avoid caffeine... she reports some incr palpit w/ stress- Alpraz 0.5mg  helps...  DIVERTICULOSIS OF COLON (ICD-562.10), IBS (ICD-564.1), & COLONIC POLYPS (ICD-211.3) -  ~  colonoscopy 12/03 by DrDBrodie showing divertics only... f/u planned 76yrs...  ~  eval 3/09 for hematochezia w/ Hg 13.5, Fe 103, repeat colon w/ 3mm polyp= hyperplastic + angiodysplasia... f/u planned 2yrs...  ~  Feb10: notes some increased abd cramping/ IBS- we will refill her LIBRAX for Prn use...  Hx of COLONIC ANGIODYSPLASIA (ICD-569.84) - as above.Marland Kitchen  BACK PAIN,  LUMBAR (ICD-724.2) - S/P LLam L4-5 yrs ago... ortho eval 9/06 by DrApplington w/ MRI showing DDD... conservative rx...  FIBROMYALGIA (ICD-729.1) - resting OK w/ TRAZADONE 100mg Qhs & FLEXERIL 10mg  Tid...  OSTEOPOROSIS (ICD-733.00) - on FOSAMAX w/ D (she insists on this Rx), +Calcium, +MVI...   ~  BMD 1/08 was improved w/ TScores -1.5 to -2.1 on FOSAMAX therapy...   ~  7/09:  she wants to discuss stopping Fosamax in favor of  Vit K which she read in "[employer]: Personal" newsletter- mult questions answered... we will check Vit D level (normal= 63) and I rec that she continue the Fosamax, calcium supplements, multivit therapy...  ~  2/10:  f/u BMD showed TScores -1.3 in Spine, & -1.8 in left FemNeck = improved...   ANXIETY (ICD-300.00) - uses ALPRAZOLAM 0.5mg  Tid Prn...  ***HEALTH MAINTENANCE:  she notes brother had shingles this yr and we discussed the shingles vaccine- I rec that she get this shot at the health dept, esp in light of the rec's from [facility] re: family history of shingles (NOTE: she reported mild case of Shingles on her left arm treated by [facility] in 2011)... she had PNEUMOVAX in 2007... gets yearly FLU SHOTsl... GYN = DrGottsegen and he has her on Premarin Rx + vag cream...   Preventive Screening-Counseling & Management  Alcohol-Tobacco     Smoking Status: quit     Packs/Day: 0.5     Year Quit: 1980  Allergies: 1)  ! Codeine 2)  ! Erythromycin  Comments:  Nurse/Medical Assistant: The patient's medications and allergies were reviewed with the patient and were updated in the Medication and Allergy Lists.  Past History:  Past Medical History: Hx of SINUSITIS (ICD-473.9) Hx of PNEUMONIA (ICD-486) HYPERTENSION (ICD-401.9) CORONARY ARTERY DISEASE (ICD-414.00) Hx of PALPITATIONS (ICD-785.1) DIVERTICULOSIS OF COLON (ICD-562.10) IRRITABLE BOWEL SYNDROME (ICD-564.1) COLONIC POLYPS (ICD-211.3) Hx of COLONIC ANGIODYSPLASIA (ICD-569.84) BACK PAIN, LUMBAR (ICD-724.2) FIBROMYALGIA (ICD-729.1) OSTEOPOROSIS (ICD-733.00) ANXIETY (ICD-300.00)  Past Surgical History: S/P T&A as a child Cholecystectomy - Lap Chole 11/04 S/P LLam  Family History: Reviewed history from 08/06/2009 and no changes required. emphysema/COPD - maternal aunts (2) allergies - MGF asthma - MGF heart disease - father with CHF rheumatism - mother stroke - mother  Social History: Reviewed history from 08/06/2009 and  no changes required. former smoker, quit 1980 - x35yrs, 1/2ppd  Alcohol Use - yes married 1 children retired: Occupational psychologist, [employer] Services operational officer Packs/Day:  0.5  Review of Systems      See HPI  The patient denies anorexia, fever, weight loss, weight gain, vision loss, decreased hearing, hoarseness, chest pain, syncope, dyspnea on exertion, peripheral edema, prolonged cough, headaches, hemoptysis, abdominal pain, melena, hematochezia, severe indigestion/heartburn, hematuria, incontinence, muscle weakness, suspicious skin lesions, transient blindness, difficulty walking, depression, unusual weight change, abnormal bleeding, enlarged lymph nodes, and angioedema.    Vital Signs:  Patient profile:   75 year old female Height:      64 inches Weight:      103.50 pounds O2 Sat:      99 % on Room air Temp:     98.6 degrees F oral Pulse rate:   81 / minute BP sitting:   120 / 60  (left arm) Cuff size:   regular  Vitals Entered By: Randell Loop CMA (January 17, 2010 10:21 AM)  O2 Sat at Rest %:  99 O2 Flow:  Room air CC: 3-4 month ROV & review... Is Patient Diabetic? No Pain Assessment Patient in pain? no  Comments meds updated today with pt   Physical Exam  Additional Exam:  WD, WN, [name] in NAD... GENERAL:  Alert & oriented; pleasant & cooperative... HEENT:  [place]/AT, EOM-wnl, PERRLA, EACs-clear, TMs-wnl, NOSE-clear, THROAT-clear & wnl. NECK:  Supple w/ full ROM; no JVD; normal carotid impulses w/o bruits; no thyromegaly or nodules palpated; no lymphadenopathy. CHEST:  Clear to P & A; without wheezes/ rales/ or rhonchi heard. HEART:  Regular Rhythm; without murmurs/ rubs/ or gallops detected. ABDOMEN:  Soft & nontender; normal bowel sounds; no organomegaly or masses palpated. EXT: without deformities, mild arthritic changes; no varicose veins/ venous insuffic/ or edema. Mult trigger points c/w FM... NEURO:  CN's intact; motor testing normal; sensory testing normal; gait normal  & balance OK. DERM:  No lesions noted; no rash etc...    Impression & Recommendations:  Problem # 1:  Hx of PNEUMONIA (ICD-486) Her prev resp illness has resolved & she is back to 100%...  Problem # 2:  HYPERTENSION (ICD-401.9) Controlled on low dose Amlodipine... continue same. Her updated medication list for this problem includes:    Norvasc 2.5 Mg Tabs (Amlodipine besylate) .Marland Kitchen... Take 1 tablet by mouth once a day  Problem # 3:  Hx of PALPITATIONS (ICD-785.1) Cardiac stable- she knows to avoid stress & caffeine...  Problem # 4:  IRRITABLE BOWEL SYNDROME (ICD-564.1) GI is stable & up to date... Librax refilled for Prn use...  Problem # 5:  FIBROMYALGIA (ICD-729.1) Stable on the Trazadone & Flexeril... she has good exerc program, etc... Her updated medication list for this problem includes:    Adult Aspirin Ec Low Strength 81 Mg Tbec (Aspirin) .Marland Kitchen... Take 1 tablet by mouth once a day    Flexeril 10 Mg Tabs (Cyclobenzaprine hcl) .Marland Kitchen... Take 1 tab by mouth three times a day as needed for muscle spasm...  Problem # 6:  OSTEOPOROSIS (ICD-733.00) Stable on Fosamax... due for f/u BMD 2/12... The following medications were removed from the medication list:    Fosamax Plus D 70-2800 Mg-unit Tabs (Alendronate-cholecalciferol) .Marland Kitchen... 1 by mouth weekly  Problem # 7:  ANXIETY (ICD-300.00) She uses the Alprazolam Prn... Her updated medication list for this problem includes:    Trazodone Hcl 100 Mg Tabs (Trazodone hcl) .Marland Kitchen... 1 tab by mouth at bedtime    Alprazolam 0.5 Mg Tabs (Alprazolam) .Marland Kitchen... Take 1/2 to 1 tab by mouth three times a day as directed...  Complete Medication List: 1)  Zyrtec Allergy 10 Mg Tabs (Cetirizine hcl) .... Take 1 tablet by mouth once a day 2)  Adult Aspirin Ec Low Strength 81 Mg Tbec (Aspirin) .... Take 1 tablet by mouth once a day 3)  Norvasc 2.5 Mg Tabs (Amlodipine besylate) .... Take 1 tablet by mouth once a day 4)  Nitroglycerin 0.4 Mg Subl (Nitroglycerin) ....  One tablet under tongue every 5 minutes as needed for chest pain---may repeat times three 5)  Clidinium-chlordiazepoxide 2.5-5 Mg Caps (Clidinium-chlordiazepoxide) .... Take 1 cap by mouth three times a day as needed for abd cramping... 6)  Premarin 0.625 Mg/gm Crea (Estrogens, conjugated) .... Apply three times weekly 7)  Calcium 600 1500 Mg Tabs (Calcium carbonate) .... Take 4 tabs once daily 8)  Multivitamins Tabs (Multiple vitamin) .... Take 1 tablet by mouth once a day 9)  Trazodone Hcl 100 Mg Tabs (Trazodone hcl) .Marland Kitchen.. 1 tab by mouth at bedtime 10)  Alprazolam 0.5 Mg Tabs (Alprazolam) .... Take 1/2 to 1 tab by mouth three times a day as directed... 11)  Terconazole 0.4 % Crea (Terconazole) .... Use 1 times per week 12)  Flexeril 10 Mg Tabs (Cyclobenzaprine hcl) .... Take 1 tab by mouth three times a day as needed for muscle spasm...  Patient Instructions: 1)  Today we updated your med list- see below.... 2)  We refilled your meds per request... 3)  Be sure to get the 2011 Flu vaccine this fall... 4)  Call for any problems.Marland KitchenMarland Kitchen 5)  Please schedule a follow-up appointment in 6 months, w/ FASTING blood work, etc... Prescriptions: ALPRAZOLAM 0.5 MG  TABS (ALPRAZOLAM) take 1/2 to 1 tab by mouth three times a day as directed...  #90 x 12   Entered and Authorized by:   Michele Mcalpine MD   Signed by:   Michele Mcalpine MD on 01/17/2010   Method used:   Print then Give to Patient   RxID:   [id number] TRAZODONE HCL 100 MG  TABS (TRAZODONE HCL) 1 tab by mouth at bedtime  #30 x 12   Entered and Authorized by:   Michele Mcalpine MD   Signed by:   Michele Mcalpine MD on 01/17/2010   Method used:   Print then Give to Patient   RxID:   [id number] CLIDINIUM-CHLORDIAZEPOXIDE 2.5-5 MG CAPS (CLIDINIUM-CHLORDIAZEPOXIDE) take 1 cap by mouth three times a day as needed for abd cramping...  #50 x 12   Entered and Authorized by:   Michele Mcalpine MD   Signed by:   Michele Mcalpine MD on 01/17/2010   Method  used:   Print then Give to Patient   RxID:   [id number] NORVASC 2.5 MG  TABS (AMLODIPINE BESYLATE) Take 1 tablet by mouth once a day  #30 x 12   Entered and Authorized by:   Michele Mcalpine MD   Signed by:   Michele Mcalpine MD on 01/17/2010   Method used:   Print then Give to Patient   RxID:   [id number]

## 2010-05-31 NOTE — Progress Notes (Signed)
Summary: set up labs  Phone Note Call from Patient Call back at Home Phone [phone]   Caller: Patient Call For: Joyce Leckey Summary of Call: pt wants to do her labs this fri morning. call pt to confirm- ok to leave msg. (pt's appt w/ sn is 3/21).  Initial call taken by: Tivis Ringer, CNA,  June 29, 2009 11:47 AM  Follow-up for Phone Call        please advise if ok or not.  Aundra Millet Reynolds LPN  June 30, 979 11:48 AM     labs are in computer for pt and i called and spoke with pt and she is aware. Randell Loop CMA  June 29, 2009 2:55 PM

## 2010-05-31 NOTE — Assessment & Plan Note (Signed)
Summary: Acute NP office visit - URI   Primary Provider/Referring Provider:  Dr. Kriste Basque  CC:  increased SOB, sore throat w/ PND, dry cough, chest congestion, pain under shoulder on right side, temp up to 100.8, body aches x5days - has been taking hydromet, and mucinex has 2 doses left of zpak.  History of Present Illness: [name] here for a 6 month follow up visit... she has multiple medical problems as noted below...     ~  Feb10:  she has been under alot of stress w/ her daughter's divorce and 88 y/o grandaughter that she hasn't seen as much... increased anxiety issues, but the Alprazolam really helps... she notes appetite good, weight stable... notes some abd cramping w/ help from Librax in the past- we will refill Rx... recent labs all look good!  ~  Sep10:  presents c/o FM flair w/ plantar fasciitis pain + discomfort in right thigh area "pulled muscle" (she notes benefit from therapeutic massage)... stress w/ husb's back surg, etc... she saw DrHochrein 6/10 w/ palpit related to the stress & he agreed w/ Rebeca Allegra Rx, no caffeine, etc... we discussed stretching exercises, hot soaks, continue Trazadone, add Flexeril...   ~  July 19, 2009:  she's had a good 16mo- recent fasting labs all WNL... BP controlled on Norvasc;  occas palpit, stress related, & resolve spont or w/ Alpraz;  no CP/ SOB/ etc...  she had f/u DrGottsegen for GYN & she relayed a message asking to come off the Fosamax for drug holiday- OK...  August 06, 2009--Presents for an acute office visit. Complains of increased SOB, sore throat w/ PND, dry cough, chest congestion, pain under shoulder on right side, temp up to 100.8, body aches x5days - has been taking hydromet, mucinex has 2 doses left of zpak. Husband with similar symptoms. Cough, congestion are no better. Throat is very sore. She does not feel good, has lots of body aches.       Medications Prior to Update: 1)  Claritin 10 Mg Caps (Loratadine) .... As Needed 2)   Fluticasone Propionate 50 Mcg/act Susp (Fluticasone Propionate) .... 2 Sprays in Each Nostril Two Times A Day... 3)  Adult Aspirin Ec Low Strength 81 Mg  Tbec (Aspirin) .... Take 1 Tablet By Mouth Once A Day 4)  Norvasc 2.5 Mg  Tabs (Amlodipine Besylate) .... Take 1 Tablet By Mouth Once A Day 5)  Nitroglycerin 0.4 Mg Subl (Nitroglycerin) .... One Tablet Under Tongue Every 5 Minutes As Needed For Chest Pain---May Repeat Times Three 6)  Fish Oil Double Strength 1200 Mg  Caps (Omega-3 Fatty Acids) .... Take One Capsule By Mouth Two Times A Day 7)  Clidinium-Chlordiazepoxide 2.5-5 Mg Caps (Clidinium-Chlordiazepoxide) .... Take 1 Cap By Mouth Three Times A Day As Needed For Abd Cramping... 8)  Premarin 0.625 Mg/gm  Crea (Estrogens, Conjugated) .... Apply Three Times Weekly 9)  Calcium 600 1500 Mg  Tabs (Calcium Carbonate) .... Take 4 Tabs Once Daily 10)  Multivitamins   Tabs (Multiple Vitamin) .... Take 1 Tablet By Mouth Once A Day 11)  Trazodone Hcl 100 Mg  Tabs (Trazodone Hcl) .Marland Kitchen.. 1 Tab By Mouth At Bedtime 12)  Alprazolam 0.5 Mg  Tabs (Alprazolam) .... Take 1/2 To 1 Tab By Mouth Three Times A Day As Directed... 13)  Terconazole 0.4 % Crea (Terconazole) .... Use 1 Times Per Week 14)  Flexeril 10 Mg Tabs (Cyclobenzaprine Hcl) .... Take 1 Tab By Mouth Three Times A Day As Needed For  Muscle Spasm... 15)  Glucosamine 500 Mg Caps (Glucosamine Sulfate) .... Take 2 Capsules Daily 16)  Zithromax Z-Pak 250 Mg Tabs (Azithromycin) .... Take As Directed  Current Medications (verified): 1)  Claritin 10 Mg Caps (Loratadine) .... As Needed 2)  Fluticasone Propionate 50 Mcg/act Susp (Fluticasone Propionate) .... 2 Sprays in Each Nostril Two Times A Day... 3)  Adult Aspirin Ec Low Strength 81 Mg  Tbec (Aspirin) .... Take 1 Tablet By Mouth Once A Day 4)  Norvasc 2.5 Mg  Tabs (Amlodipine Besylate) .... Take 1 Tablet By Mouth Once A Day 5)  Nitroglycerin 0.4 Mg Subl (Nitroglycerin) .... One Tablet Under Tongue Every 5  Minutes As Needed For Chest Pain---May Repeat Times Three 6)  Fish Oil Double Strength 1200 Mg  Caps (Omega-3 Fatty Acids) .... Take One Capsule By Mouth Two Times A Day 7)  Clidinium-Chlordiazepoxide 2.5-5 Mg Caps (Clidinium-Chlordiazepoxide) .... Take 1 Cap By Mouth Three Times A Day As Needed For Abd Cramping... 8)  Premarin 0.625 Mg/gm  Crea (Estrogens, Conjugated) .... Apply Three Times Weekly 9)  Calcium 600 1500 Mg  Tabs (Calcium Carbonate) .... Take 4 Tabs Once Daily 10)  Multivitamins   Tabs (Multiple Vitamin) .... Take 1 Tablet By Mouth Once A Day 11)  Trazodone Hcl 100 Mg  Tabs (Trazodone Hcl) .Marland Kitchen.. 1 Tab By Mouth At Bedtime 12)  Alprazolam 0.5 Mg  Tabs (Alprazolam) .... Take 1/2 To 1 Tab By Mouth Three Times A Day As Directed... 13)  Terconazole 0.4 % Crea (Terconazole) .... Use 1 Times Per Week 14)  Flexeril 10 Mg Tabs (Cyclobenzaprine Hcl) .... Take 1 Tab By Mouth Three Times A Day As Needed For Muscle Spasm... 15)  Glucosamine 500 Mg Caps (Glucosamine Sulfate) .... Take 2 Capsules Daily 16)  Zithromax Z-Pak 250 Mg Tabs (Azithromycin) .... Take As Directed  Allergies (verified): 1)  ! Codeine 2)  ! Erythromycin  Past History:  Past Medical History: Last updated: 07/19/2009  Hx of SINUSITIS (ICD-473.9) HYPERTENSION (ICD-401.9) CORONARY ARTERY DISEASE (ICD-414.00) Hx of PALPITATIONS (ICD-785.1) DIVERTICULOSIS OF COLON (ICD-562.10) IRRITABLE BOWEL SYNDROME (ICD-564.1) COLONIC POLYPS (ICD-211.3) Hx of COLONIC ANGIODYSPLASIA (ICD-569.84) BACK PAIN, LUMBAR (ICD-724.2) FIBROMYALGIA (ICD-729.1) OSTEOPOROSIS (ICD-733.00) ANXIETY (ICD-300.00)   1. Coronary spasm with nonobstructive coronary disease.  2. Anxiety.  3. Palpitaitons  4. Hypertension  Past Surgical History: Last updated: 07/19/2009 S/P T&A as a child Cholecystectomy - Lap Chole 11/04 S/P LLam  Family History: Last updated: 08/06/2009 emphysema/COPD - maternal aunts (2) allergies - MGF asthma -  MGF heart disease - father with CHF rheumatism - mother stroke - mother  Social History: Last updated: 08/06/2009 former smoker, quit 1980 - x84yrs, 1/2ppd  Alcohol Use - yes married 1 children retired: taught preschool, Diplomatic Services operational officer  Risk Factors: Smoking Status: quit (09/24/2008)  Family History: emphysema/COPD - maternal aunts (2) allergies - MGF asthma - MGF heart disease - father with CHF rheumatism - mother stroke - mother  Social History: former smoker, quit 1980 - x59yrs, 1/2ppd  Alcohol Use - yes married 1 children retired: Occupational psychologist, Diplomatic Services operational officer  Review of Systems      See HPI  Vital Signs:  Patient profile:   75 year old female Height:      64 inches Weight:      105.13 pounds BMI:     18.11 O2 Sat:      97 % on Room air Temp:     98.2 degrees F oral Pulse rate:   94 / minute BP sitting:  118 / 84  (right arm) Cuff size:   regular  Vitals Entered By: Boone Master CNA (August 06, 2009 11:14 AM)  O2 Flow:  Room air CC: increased SOB, sore throat w/ PND, dry cough, chest congestion, pain under shoulder on right side, temp up to 100.8, body aches x5days - has been taking hydromet, mucinex has 2 doses left of zpak Is Patient Diabetic? No Comments Medications reviewed with patient Daytime contact number verified with patient. Boone Master CNA  August 06, 2009 11:14 AM    Physical Exam  Additional Exam:  WD, WN, [name] in NAD... GENERAL:  Alert & oriented; pleasant & cooperative... HEENT:  [place]/AT, EOM-wnl, PERRLA, EACs-clear, TMs-wnl, NOSE-clear discharge,  THROAT-clear & wnl. NECK:  Supple w/ full ROM; no JVD; normal carotid impulses w/o bruits; no thyromegaly or nodules palpated; no lymphadenopathy. CHEST:  Clear to P & A; without wheezes/ rales/ or rhonchi., barking cough HEART:  Regular Rhythm; without murmurs/ rubs/ or gallops. ABDOMEN:  Soft & nontender; normal bowel sounds; no organomegaly or masses detected. EXT: without deformities,  mild arthritic changes; no varicose veins/ venous insuffic/ or edema.     Impression & Recommendations:  Problem # 1:  BRONCHITIS, ACUTE (ICD-466.0) Slow to resolve bronchitis.  REC: xopenex neb in office  Finish Zpack.  Increase fluids, and rest.  Tylneol as needed.   Mucinex DM two times a day as needed cough, congestion.  Prednisone taper over next week.  Please contact office for sooner follow up if symptoms do not improve or worsen   Her updated medication list for this problem includes:    Zithromax Z-pak 250 Mg Tabs (Azithromycin) .Marland Kitchen... Take as directed    Hydromet 5-1.5 Mg/23ml Syrp (Hydrocodone-homatropine) .Marland Kitchen... 1-2 tsp every 4-6 hr as needed cough  Orders: Est. Patient Level IV (62703)  Medications Added to Medication List This Visit: 1)  Hydromet 5-1.5 Mg/55ml Syrp (Hydrocodone-homatropine) .Marland Kitchen.. 1-2 tsp every 4-6 hr as needed cough 2)  Prednisone 10 Mg Tabs (Prednisone) .... 4 tabs for 2 days, then 3 tabs for 2 days, 2 tabs for 2 days, then 1 tab for 2 days, then stop  Complete Medication List: 1)  Claritin 10 Mg Caps (Loratadine) .... As needed 2)  Fluticasone Propionate 50 Mcg/act Susp (Fluticasone propionate) .... 2 sprays in each nostril two times a day... 3)  Adult Aspirin Ec Low Strength 81 Mg Tbec (Aspirin) .... Take 1 tablet by mouth once a day 4)  Norvasc 2.5 Mg Tabs (Amlodipine besylate) .... Take 1 tablet by mouth once a day 5)  Nitroglycerin 0.4 Mg Subl (Nitroglycerin) .... One tablet under tongue every 5 minutes as needed for chest pain---may repeat times three 6)  Fish Oil Double Strength 1200 Mg Caps (Omega-3 fatty acids) .... Take one capsule by mouth two times a day 7)  Clidinium-chlordiazepoxide 2.5-5 Mg Caps (Clidinium-chlordiazepoxide) .... Take 1 cap by mouth three times a day as needed for abd cramping... 8)  Premarin 0.625 Mg/gm Crea (Estrogens, conjugated) .... Apply three times weekly 9)  Calcium 600 1500 Mg Tabs (Calcium carbonate) .... Take 4  tabs once daily 10)  Multivitamins Tabs (Multiple vitamin) .... Take 1 tablet by mouth once a day 11)  Trazodone Hcl 100 Mg Tabs (Trazodone hcl) .Marland Kitchen.. 1 tab by mouth at bedtime 12)  Alprazolam 0.5 Mg Tabs (Alprazolam) .... Take 1/2 to 1 tab by mouth three times a day as directed... 13)  Terconazole 0.4 % Crea (Terconazole) .... Use 1 times  per week 14)  Flexeril 10 Mg Tabs (Cyclobenzaprine hcl) .... Take 1 tab by mouth three times a day as needed for muscle spasm... 15)  Glucosamine 500 Mg Caps (Glucosamine sulfate) .... Take 2 capsules daily 16)  Zithromax Z-pak 250 Mg Tabs (Azithromycin) .... Take as directed 17)  Hydromet 5-1.5 Mg/21ml Syrp (Hydrocodone-homatropine) .Marland Kitchen.. 1-2 tsp every 4-6 hr as needed cough 18)  Prednisone 10 Mg Tabs (Prednisone) .... 4 tabs for 2 days, then 3 tabs for 2 days, 2 tabs for 2 days, then 1 tab for 2 days, then stop  Patient Instructions: 1)  Finish Zpack.  2)  Increase fluids, and rest.  3)  Tylneol as needed.   4)  Mucinex DM two times a day as needed cough, congestion.  5)  Prednisone taper over next week.  6)  Please contact office for sooner follow up if symptoms do not improve or worsen   Prescriptions: PREDNISONE 10 MG TABS (PREDNISONE) 4 tabs for 2 days, then 3 tabs for 2 days, 2 tabs for 2 days, then 1 tab for 2 days, then stop  #20 x 0   Entered and Authorized by:   Rubye Oaks NP   Signed by:   Sanders Manninen NP on 08/06/2009   Method used:   Electronically to        [facility] (retail)       [address]       [place], [place]  [address]       Ph: [phone]       Fax: [phone]   RxID:   [id number] HYDROMET 5-1.5 MG/5ML SYRP (HYDROCODONE-HOMATROPINE) 1-2 tsp every 4-6 hr as needed cough  #8 oz x 0   Entered and Authorized by:   Rubye Oaks NP   Signed by:   Sheana Bir NP on 08/06/2009   Method used:   Print then Give to Patient   RxID:   [id number]    Immunization  History:  Influenza Immunization History:    Influenza:  historical (01/29/2009)   Appended Document: Orders Update     Clinical Lists Changes  Orders: Added new Service order of Xopenex 1.25mg  [phone]) - Signed Added new Service order of Nebulizer Tx ([id number]) - Signed       Medication Administration  Medication # 1:    Medication: Xopenex 1.25mg     Diagnosis: BRONCHITIS, ACUTE (ICD-466.0)    Dose: 1 vial    Route: inhaled    Exp Date: 09/11    Lot #: [id number]    Mfr: SEPRACOR    Patient tolerated medication without complications    Given by: Zackery Barefoot CMA (August 06, 2009 11:56 AM)  Orders Added: 1)  Xopenex 1.25mg  [I4332] 2)  Nebulizer Tx [[id number]]

## 2010-05-31 NOTE — Progress Notes (Signed)
Summary: ? re: otc med  Phone Note Call from Patient Call back at [facility] Phone [phone]   Caller: Patient Call For: Kriste Basque Reason for Call: Talk to Nurse Summary of Call: Want to see how long SN wanted her to take the Mucinex 1200 mg two times a day ? Initial call taken by: Eugene Gavia,  October 06, 2009 9:47 AM  Follow-up for Phone Call        Pt aware to take Mucinex with Rx per SN recs.Reynaldo Minium CMA  October 06, 2009 9:57 AM

## 2010-05-31 NOTE — Miscellaneous (Signed)
Summary: Orders Update - cxr order   Clinical Lists Changes  Orders: Added new Test order of T-2 View CXR ([id number]) - Signed     received phone call from kristin in the xray department stating that patient was down there for her cxr before appt w/ SN.  order in IDX but order needed to be placed in EMR.  done. Boone Master CNA/MA  Sep 28, 2009 2:34 PM

## 2010-05-31 NOTE — Progress Notes (Signed)
Summary: bone density -   Phone Note Call from Patient Call back at Home Phone [phone]   Caller: Patient Call For: nadel Reason for Call: Talk to Nurse Summary of Call: Patient needing bone density test.  She says she gets one every two years.  Is it time for test if so need an order sent to [facility]. Initial call taken by: Lehman Prom,  January 17, 2010 11:26 AM  Follow-up for Phone Call        Pt was seen by SN today.  [facility] Crystal Jones RN  January 17, 2010 11:35 AM  Pt states she is due for a bone density test in 2012. She has her physical set for 07-19-10 and wants bone density schedueld before that appt so SN can review results. Please advise if ok to place order. Carron Curie CMA  January 17, 2010 11:49 AM   Additional Follow-up for Phone Call Additional follow up Details #1::        per SN---ok for pt to have BMD for 06/2010.  lmomtcb for pt to see what day she wants to come in for this. Randell Loop [facility]  January 17, 2010 2:20 PM    pt called me back and bmd is schduled for 06-07-2010 at 9:30 [address] [facility]  January 17, 2010 2:38 PM

## 2010-05-31 NOTE — Progress Notes (Signed)
Summary: pt sick/ wants to be seen today  Phone Note Call from Patient Call back at Home Phone [phone]   Caller: Patient Call For: nadel Reason for Call: Talk to Nurse Summary of Call: chest pain, cough, temp, aching all over, legs & arms, back, some plegm clogging throat but nothing out.  Wants to see tammy P Initial call taken by: Eugene Gavia,  August 03, 2009 8:46 AM  Follow-up for Phone Call        called and spoke with pt.  pt states symptoms started 4 days ago.   pt c/o fever of 100.0, body aches, questionable flu, head congestion, PND, sore throat, "chest hurts and feels tight,"  non-productive cough, chest congestion, "feels congestion in throat but unable to cough anything up.  Pt states she has been taking Mucinex DM.  Please advise.  Thank you.  Aundra Millet Reynolds LPN  August 04, 6211 9:00 AM   allergies:  codeine, erythromycin  Additional Follow-up for Phone Call Additional follow up Details #1::        pt says she was told that a nurse would "check w/ tp to see if pt could be seen this am. she is upset/ concerned that she hasn't been called back since last speaking to nurse. call asap pleas. Tivis Ringer, CNA  August 03, 2009 10:53 AM    Additional Follow-up for Phone Call Additional follow up Details #2::    per SN---continue with the mucinex and increase the fluids--tylenol as needed and rest---can give her zpak #1 take as directed.  thanks Randell Loop CMA  August 03, 2009 11:13 AM   called, spoke with pt.  Pt informed of above statement and recs per SN and aware zpak sent to [facility].  Informed pt if symptoms do no improve or get worse to call back.  She verbalized understanding of instructions.   Follow-up by: Gweneth Dimitri RN,  August 03, 2009 11:33 AM  New/Updated Medications: ZITHROMAX Z-PAK 250 MG TABS (AZITHROMYCIN) take as directed Prescriptions: ZITHROMAX Z-PAK 250 MG TABS (AZITHROMYCIN) take as directed  #1 x 0   Entered by:   Gweneth Dimitri RN  Authorized by:   Michele Mcalpine MD   Signed by:   Gweneth Dimitri RN on 08/03/2009   Method used:   Electronically to        [facility] ([facility]ail)       [address]       [place], [place]  [address]       Ph: [phone]       Fax: [phone]   RxID:   [id number]

## 2010-06-02 NOTE — Progress Notes (Signed)
Summary: prescription  Phone Note Call from Patient Call back at Home Phone [phone]   Caller: Patient Call For: Tammy Parrett Summary of Call: Pt phoned she saw Tammy on 12/22 and was given a prescription for an antibiotic and stated that Tammy told her to hold off taking it. Patient isn't any better and would like to know if she should start the anitibiotic now. [name] [name] can be reached [phone] Initial call taken by: Vedia Coffer,  April 27, 2010 2:18 PM  Follow-up for Phone Call        Spoke with pt.  She states that she is going to go ahead and take the rx for cefdinir that TP gave her.  She states that her symptoms are no better since she was seen.  She wants to let TP know that she is going to take the abx and also let her know that her "throat is scratchy"- I advised that she take the abx, cont mucinex as needed for cough/congestion, increase fluids and rest.  Pt verbalized understanding and is aware that TP is out of the office this pm. Follow-up by: Vernie Murders,  April 27, 2010 2:38 PM  Additional Follow-up for Phone Call Additional follow up Details #1::        that sounds good Please contact office for sooner follow up if symptoms do not improve or worsen  Additional Follow-up by: Rubye Oaks NP,  April 28, 2010 9:11 AM

## 2010-06-02 NOTE — Assessment & Plan Note (Signed)
Summary: Acute NP office visit - URI   Primary Provider/Referring Provider:  Dr. Kriste Basque  CC:  sore throat, dry cough, chest congestion, a "raw" feeling in bronchials x2days - denies wheezing, SOB, and f/c/s.  History of Present Illness: [name] with known hx of HTN,     Sep 08, 2009--Returns for follow up PNA - still having dry cough, wheezing, occ SOB, irritation in brochials.  pt states overall she is better than last ov. She is feeling stronger, energy level is getting better. Dry cough is main complaint. Tessalon and Hydromet help. She did stop mucinex DM b/c she ran out. Also restarted fish oil. She is eating but not as much b/c she is not burning as much calories. She does not want to gain any weight. We discussed calorie need during this time of infection. Her cough is mainly dry w/ no discolored mucus or hemoptysis. Ribs are sore from coughing. No hemoptysis. Denies chest pain, dyspnea, orthopnea, hemoptysis, fever, n/v/d, edema, headache. denies reflux but has bad taste in mouth/throat in am when she gets up.    ~  January 17, 2010:  above resp illness finally resolved & she is back to baseline "everything is 100%"...  BP well controlled on low dose Amlodipine;  she had f/u DrHochrein 6/11- stable & denies CP, palpit, SOB, edema, etc;  GI is stable & Librax refilled;  FM is stable w/ her Trazadone & Flexeril;  she had Shingles left arm per DrHouston... she requests refill perscriptions & will get the Flu vaccine at [facility] volunteer dept...   April 21, 2010 -Presents for an acute office visit. Complains of sore throat, dry cough, chest congestion, a "raw" feeling in bronchials x2days . She is leaving to go out of town and does not want to get worse. .Feels tired and has alot of drainage. Denies chest pain, orthopnea, hemoptysis, fever, n/v/d, edema, headache. [name] here for a 6 month follow up visit... she has multiple medical problems as noted below...     ~  Feb10:  she has been  under alot of stress w/ her daughter's divorce and 87 y/o grandaughter that she hasn't seen as much... increased anxiety issues, but the Alprazolam really helps... she notes appetite good, weight stable... notes some abd cramping w/ help from Librax in the past- we will refill Rx... recent labs all look good!  ~  Sep10:  presents c/o FM flair w/ plantar fasciitis pain + discomfort in right thigh area "pulled muscle" (she notes benefit from therapeutic massage)... stress w/ husb's back surg, etc... she saw DrHochrein 6/10 w/ palpit related to the stress & he agreed w/ Rebeca Allegra Rx, no caffeine, etc... we discussed stretching exercises, hot soaks, continue Trazadone, add Flexeril...   ~  Mar11:  she's had a good 5mo- recent fasting labs all WNL... BP controlled on Norvasc;  occas palpit, stress related, & resolve spont or w/ Alpraz;  no CP/ SOB/ etc...  she had f/u DrGottsegen for GYN & she relayed a message asking to come off the Fosamax for drug holiday- OK...   ~  Sep 28, 2009:  she had a resp illness w/ marked constitutional symptoms 4/11 & saw TP on several occas w/ Zpak, Avelox, Steroid taper, Mucinex, Hydromet, Tessalon... still feels crummy w/ dry cough, hacking, and several times mentioned that she has never been this sick... CXR's reviewed> 4/25 w/ RML pneumonia;  5/11 showed improved RML & new lingular opac;  5/31 showed improvement... we discussed  checking labs (CBC, Sed, BMet= normal), and CT Chest for completeness (mucoid impaction, atx, scarring)... Rx w/ Depo80, Medrol 5dtaper, Tussionex Prn, +MUCINEX & Fluids.     Medications Prior to Update: 1)  Zyrtec Allergy 10 Mg Tabs (Cetirizine Hcl) .... Take 1 Tablet By Mouth Once A Day 2)  Adult Aspirin Ec Low Strength 81 Mg  Tbec (Aspirin) .... Take 1 Tablet By Mouth Once A Day 3)  Norvasc 2.5 Mg  Tabs (Amlodipine Besylate) .... Take 1 Tablet By Mouth Once A Day 4)  Nitroglycerin 0.4 Mg Subl (Nitroglycerin) .... One Tablet Under Tongue Every 5  Minutes As Needed For Chest Pain---May Repeat Times Three 5)  Clidinium-Chlordiazepoxide 2.5-5 Mg Caps (Clidinium-Chlordiazepoxide) .... Take 1 Cap By Mouth Three Times A Day As Needed For Abd Cramping... 6)  Premarin 0.625 Mg/gm  Crea (Estrogens, Conjugated) .... Apply Three Times Weekly 7)  Calcium 600 1500 Mg  Tabs (Calcium Carbonate) .... Take 4 Tabs Once Daily 8)  Multivitamins   Tabs (Multiple Vitamin) .... Take 1 Tablet By Mouth Once A Day 9)  Trazodone Hcl 100 Mg  Tabs (Trazodone Hcl) .Marland Kitchen.. 1 Tab By Mouth At Bedtime 10)  Alprazolam 0.5 Mg  Tabs (Alprazolam) .... Take 1/2 To 1 Tab By Mouth Three Times A Day As Directed... 11)  Terconazole 0.4 % Crea (Terconazole) .... Use 1 Times Per Week 12)  Flexeril 10 Mg Tabs (Cyclobenzaprine Hcl) .... Take 1 Tab By Mouth Three Times A Day As Needed For Muscle Spasm...  Current Medications (verified): 1)  Zyrtec Allergy 10 Mg Tabs (Cetirizine Hcl) .... Take 1 Tablet By Mouth Once A Day 2)  Adult Aspirin Ec Low Strength 81 Mg  Tbec (Aspirin) .... Take 1 Tablet By Mouth Once A Day 3)  Norvasc 2.5 Mg  Tabs (Amlodipine Besylate) .... Take 1 Tablet By Mouth Once A Day 4)  Nitroglycerin 0.4 Mg Subl (Nitroglycerin) .... One Tablet Under Tongue Every 5 Minutes As Needed For Chest Pain---May Repeat Times Three 5)  Clidinium-Chlordiazepoxide 2.5-5 Mg Caps (Clidinium-Chlordiazepoxide) .... Take 1 Cap By Mouth Three Times A Day As Needed For Abd Cramping... 6)  Premarin 0.625 Mg/gm  Crea (Estrogens, Conjugated) .... Apply Three Times Weekly 7)  Calcium 600 1500 Mg  Tabs (Calcium Carbonate) .... Take 4 Tabs Once Daily 8)  Multivitamins   Tabs (Multiple Vitamin) .... Take 1 Tablet By Mouth Once A Day 9)  Trazodone Hcl 100 Mg  Tabs (Trazodone Hcl) .Marland Kitchen.. 1 Tab By Mouth At Bedtime 10)  Alprazolam 0.5 Mg  Tabs (Alprazolam) .... Take 1/2 To 1 Tab By Mouth Three Times A Day As Directed... 11)  Terconazole 0.4 % Crea (Terconazole) .... Use 1 Times Per Week 12)  Flexeril 10  Mg Tabs (Cyclobenzaprine Hcl) .... Take 1 Tab By Mouth Three Times A Day As Needed For Muscle Spasm...  Allergies (verified): 1)  ! Codeine 2)  ! Erythromycin  Past History:  Past Medical History: Last updated: 01/17/2010 Hx of SINUSITIS (ICD-473.9) Hx of PNEUMONIA (ICD-486) HYPERTENSION (ICD-401.9) CORONARY ARTERY DISEASE (ICD-414.00) Hx of PALPITATIONS (ICD-785.1) DIVERTICULOSIS OF COLON (ICD-562.10) IRRITABLE BOWEL SYNDROME (ICD-564.1) COLONIC POLYPS (ICD-211.3) Hx of COLONIC ANGIODYSPLASIA (ICD-569.84) BACK PAIN, LUMBAR (ICD-724.2) FIBROMYALGIA (ICD-729.1) OSTEOPOROSIS (ICD-733.00) ANXIETY (ICD-300.00)  Past Surgical History: Last updated: 01/17/2010 S/P T&A as a child Cholecystectomy - Lap Chole 11/04 S/P LLam  Family History: Last updated: 01/17/2010 emphysema/COPD - maternal aunts (2) allergies - MGF asthma - MGF heart disease - father with CHF rheumatism - mother stroke - mother  Social History: Last updated: 08/06/2009 former smoker, quit 1980 - x32yrs, 1/2ppd  Alcohol Use - yes married 1 children retired: taught preschool, Diplomatic Services operational officer  Risk Factors: Smoking Status: quit (01/17/2010) Packs/Day: 0.5 (01/17/2010)  Review of Systems      See HPI  Vital Signs:  Patient profile:   75 year old female Height:      64 inches Weight:      103.25 pounds BMI:     17.79 O2 Sat:      96 % on Room air Temp:     98.0 degrees F oral Pulse rate:   105 / minute BP sitting:   122 / 84  (left arm) Cuff size:   regular  Vitals Entered By: Boone Master CNA/MA (April 21, 2010 12:13 PM)  O2 Flow:  Room air CC: sore throat, dry cough, chest congestion, a "raw" feeling in bronchials x2days - denies wheezing, SOB, f/c/s Is Patient Diabetic? No Comments Medications reviewed with patient Daytime contact number verified with patient. Boone Master CNA/MA  April 21, 2010 12:11 PM    Physical Exam  Additional Exam:  WD, WN, 76 y/o WF in NAD... GENERAL:   Alert & oriented; pleasant & cooperative... HEENT:  [place]/AT, EOM-wnl, PERRLA, EACs-clear, TMs-wnl, NOSE-clear discharge,  THROAT-clear & wnl. NECK:  Supple w/ full ROM; no JVD; normal carotid impulses w/o bruits; no thyromegaly or nodules palpated; no lymphadenopathy. CHEST:  Clear to P & A; without wheezes/ rales/ or rhonchi  HEART:  Regular Rhythm; without murmurs/ rubs/ or gallops. ABDOMEN:  Soft & nontender; normal bowel sounds; no organomegaly or masses detected. EXT: without deformities, mild arthritic changes; no varicose veins/ venous insuffic/ or edema.     Impression & Recommendations:  Problem # 1:  UPPER RESPIRATORY INFECTION, ACUTE (ICD-465.9)  Omnicef 300mg  two times a day for 7 days to have on hold if symptoms worsen or persist with discolored mucus.  Mucinex DM two times a day as needed cough/congestion  Saline nasal rinses as needed  Zyrtec 10mg  once daily for drainge.  Tylenol as needed  Fluids and rest  Please contact office for sooner follow up if symptoms do not improve or worsen  Her updated medication list for this problem includes:    Zyrtec Allergy 10 Mg Tabs (Cetirizine hcl) .Marland Kitchen... Take 1 tablet by mouth once a day    Adult Aspirin Ec Low Strength 81 Mg Tbec (Aspirin) .Marland Kitchen... Take 1 tablet by mouth once a day  Orders: Est. Patient Level III (81191)  Medications Added to Medication List This Visit: 1)  Cefdinir 300 Mg Caps (Cefdinir) .Marland Kitchen.. 1 by mouth two times a day  Patient Instructions: 1)  Omnicef 300mg  two times a day for 7 days to have on hold if symptoms worsen or persist with discolored mucus.  2)  Mucinex DM two times a day as needed cough/congestion  3)  Saline nasal rinses as needed  4)  Zyrtec 10mg  once daily for drainge.  5)  Tylenol as needed  6)  Fluids and rest  7)  Please contact office for sooner follow up if symptoms do not improve or worsen  Prescriptions: CEFDINIR 300 MG CAPS (CEFDINIR) 1 by mouth two times a day  #14 x 0   Entered and  Authorized by:   Rubye Oaks NP   Signed by:   Rubye Oaks NP on 04/21/2010   Method used:   Electronically to        [facility] (retail)  [address]       [place], [place]  [address]       Ph: [phone]       Fax: [phone]   RxID:   [id number]    Immunization History:  Influenza Immunization History:    Influenza:  historical (01/29/2010)

## 2010-06-06 ENCOUNTER — Encounter: Payer: Self-pay | Admitting: Pulmonary Disease

## 2010-06-07 ENCOUNTER — Other Ambulatory Visit: Payer: Self-pay

## 2010-06-16 NOTE — Miscellaneous (Signed)
Summary: Orders Update-bone density and lva   Clinical Lists Changes  Orders: Added new Test order of T-Bone Densitometry [phone]) - Signed Added new Test order of T-Lumbar Vertebral Assessment [phone]) - Signed

## 2010-06-20 ENCOUNTER — Encounter (INDEPENDENT_AMBULATORY_CARE_PROVIDER_SITE_OTHER): Payer: Medicare Other | Admitting: Obstetrics and Gynecology

## 2010-06-20 ENCOUNTER — Other Ambulatory Visit: Payer: Self-pay | Admitting: Obstetrics and Gynecology

## 2010-06-20 ENCOUNTER — Other Ambulatory Visit (HOSPITAL_COMMUNITY)
Admission: RE | Admit: 2010-06-20 | Discharge: 2010-06-20 | Disposition: A | Discharge status: Home / Self Care | Payer: Medicare Other | Source: Ambulatory Visit | Attending: Obstetrics and Gynecology | Admitting: Obstetrics and Gynecology

## 2010-06-20 DIAGNOSIS — Z124 Encounter for screening for malignant neoplasm of cervix: Secondary | ICD-10-CM

## 2010-06-20 DIAGNOSIS — B373 Candidiasis of vulva and vagina: Secondary | ICD-10-CM

## 2010-06-20 DIAGNOSIS — N952 Postmenopausal atrophic vaginitis: Secondary | ICD-10-CM

## 2010-06-20 DIAGNOSIS — N898 Other specified noninflammatory disorders of vagina: Secondary | ICD-10-CM

## 2010-06-20 DIAGNOSIS — N951 Menopausal and female climacteric states: Secondary | ICD-10-CM

## 2010-06-22 ENCOUNTER — Telehealth: Payer: Self-pay | Admitting: Pulmonary Disease

## 2010-06-28 NOTE — Progress Notes (Signed)
Summary: labs  Phone Note Call from Patient Call back at Home Phone [phone]   Caller: Patient Call For: [name] [name] Summary of Call: Pt has appt on 3/20 and wants to schedule labs prior to this pls advise. Initial call taken by: Darletta Moll,  June 22, 2010 4:23 PM  Follow-up for Phone Call        called and spoke with pt and she stated that she has appt with SN on 3/20 and will be in [place] with her grandchild most of the month of march so she wants to come in on 2/29 for her fasting labs---please advise of labs to put in computer Randell Loop [facility]  June 22, 2010 4:30 PM   Additional Follow-up for Phone Call Additional follow up Details #1::        labs are in the computer for the pt on 2/29 and pt is aware Randell Loop CMA  June 23, 2010 8:31 AM

## 2010-06-29 ENCOUNTER — Encounter (INDEPENDENT_AMBULATORY_CARE_PROVIDER_SITE_OTHER): Payer: Self-pay | Admitting: *Deleted

## 2010-06-29 ENCOUNTER — Other Ambulatory Visit: Payer: Self-pay | Admitting: Pulmonary Disease

## 2010-06-29 ENCOUNTER — Other Ambulatory Visit: Payer: Medicare Other

## 2010-06-29 DIAGNOSIS — E7800 Pure hypercholesterolemia, unspecified: Secondary | ICD-10-CM

## 2010-06-29 DIAGNOSIS — D649 Anemia, unspecified: Secondary | ICD-10-CM

## 2010-06-29 DIAGNOSIS — I10 Essential (primary) hypertension: Secondary | ICD-10-CM

## 2010-06-29 DIAGNOSIS — E785 Hyperlipidemia, unspecified: Secondary | ICD-10-CM

## 2010-06-29 DIAGNOSIS — E039 Hypothyroidism, unspecified: Secondary | ICD-10-CM

## 2010-06-29 DIAGNOSIS — R748 Abnormal levels of other serum enzymes: Secondary | ICD-10-CM

## 2010-06-29 LAB — CBC WITH DIFFERENTIAL/PLATELET
Basophils Absolute: 0 10*3/uL (ref 0.0–0.1)
Basophils Relative: 0.9 % (ref 0.0–3.0)
Eosinophils Absolute: 0.2 10*3/uL (ref 0.0–0.7)
Eosinophils Relative: 4.4 % (ref 0.0–5.0)
HCT: 42.6 % (ref 36.0–46.0)
Lymphocytes Relative: 38.4 % (ref 12.0–46.0)
Lymphs Abs: 1.7 10*3/uL (ref 0.7–4.0)
Monocytes Absolute: 0.5 10*3/uL (ref 0.1–1.0)
Neutro Abs: 2.1 10*3/uL (ref 1.4–7.7)
Platelets: 263 10*3/uL (ref 150.0–400.0)
WBC: 4.5 10*3/uL (ref 4.5–10.5)

## 2010-06-29 LAB — BASIC METABOLIC PANEL
BUN: 10 mg/dL (ref 6–23)
Chloride: 103 mEq/L (ref 96–112)
Creatinine, Ser: 0.7 mg/dL (ref 0.4–1.2)
GFR: 94.93 mL/min (ref >60.00)
Glucose, Bld: 77 mg/dL (ref 70–99)

## 2010-06-29 LAB — HEPATIC FUNCTION PANEL
ALT: 19 U/L (ref 0–35)
AST: 22 U/L (ref 0–37)
Total Bilirubin: 0.7 mg/dL (ref 0.3–1.2)
Total Protein: 6.4 g/dL (ref 6.0–8.3)

## 2010-06-29 LAB — LIPID PANEL: Cholesterol: 201 mg/dL — ABNORMAL HIGH (ref 0–200)

## 2010-07-12 ENCOUNTER — Telehealth: Payer: Self-pay | Admitting: Pulmonary Disease

## 2010-07-19 ENCOUNTER — Encounter: Payer: Self-pay | Admitting: Adult Health

## 2010-07-19 ENCOUNTER — Ambulatory Visit (INDEPENDENT_AMBULATORY_CARE_PROVIDER_SITE_OTHER): Payer: Medicare Other | Admitting: Pulmonary Disease

## 2010-07-19 ENCOUNTER — Encounter: Payer: Self-pay | Admitting: Pulmonary Disease

## 2010-07-19 ENCOUNTER — Ambulatory Visit (INDEPENDENT_AMBULATORY_CARE_PROVIDER_SITE_OTHER)
Admission: RE | Admit: 2010-07-19 | Discharge: 2010-07-19 | Disposition: A | Discharge status: Home / Self Care | Payer: Medicare Other | Source: Ambulatory Visit | Attending: Pulmonary Disease | Admitting: Pulmonary Disease

## 2010-07-19 DIAGNOSIS — K589 Irritable bowel syndrome without diarrhea: Secondary | ICD-10-CM

## 2010-07-19 DIAGNOSIS — D126 Benign neoplasm of colon, unspecified: Secondary | ICD-10-CM

## 2010-07-19 DIAGNOSIS — I10 Essential (primary) hypertension: Secondary | ICD-10-CM

## 2010-07-19 DIAGNOSIS — J329 Chronic sinusitis, unspecified: Secondary | ICD-10-CM

## 2010-07-19 DIAGNOSIS — R059 Cough, unspecified: Secondary | ICD-10-CM

## 2010-07-19 DIAGNOSIS — J189 Pneumonia, unspecified organism: Secondary | ICD-10-CM

## 2010-07-19 DIAGNOSIS — R002 Palpitations: Secondary | ICD-10-CM

## 2010-07-19 DIAGNOSIS — J069 Acute upper respiratory infection, unspecified: Secondary | ICD-10-CM

## 2010-07-19 DIAGNOSIS — R05 Cough: Secondary | ICD-10-CM

## 2010-07-19 DIAGNOSIS — I2510 Atherosclerotic heart disease of native coronary artery without angina pectoris: Secondary | ICD-10-CM

## 2010-07-19 DIAGNOSIS — Z0000 Encounter for general adult medical examination without abnormal findings: Secondary | ICD-10-CM | POA: Insufficient documentation

## 2010-07-19 DIAGNOSIS — J209 Acute bronchitis, unspecified: Secondary | ICD-10-CM

## 2010-07-19 DIAGNOSIS — K5730 Diverticulosis of large intestine without perforation or abscess without bleeding: Secondary | ICD-10-CM

## 2010-07-19 DIAGNOSIS — F411 Generalized anxiety disorder: Secondary | ICD-10-CM

## 2010-07-19 DIAGNOSIS — IMO0001 Reserved for inherently not codable concepts without codable children: Secondary | ICD-10-CM

## 2010-07-19 DIAGNOSIS — M810 Age-related osteoporosis without current pathological fracture: Secondary | ICD-10-CM

## 2010-07-19 MED ORDER — ALPRAZOLAM 0.5 MG PO TABS: ORAL_TABLET | ORAL | Status: DC

## 2010-07-19 NOTE — Patient Instructions (Addendum)
We refilled your Alprazolam today... We did a f/u CXR today- please call the phone tree for your result... We ordered a Bone Density test as well & we will call you w/ this result when avail... Call for any problems... Let's sched a follow up appt in 6 months.Marland KitchenMarland Kitchen

## 2010-07-19 NOTE — Assessment & Plan Note (Signed)
She is stable on the Trazodone & Flexeril... Resting better, less achey & sore, improved... Continue Rx.

## 2010-07-19 NOTE — Assessment & Plan Note (Signed)
She stopped the Fosamax in 2011 & is now due for a f/u BMD> we will sched & call results to the pt... Continue Rx w/ Calcium, Vits, (plus her Premarin cream etc).Marland KitchenMarland Kitchen

## 2010-07-19 NOTE — Assessment & Plan Note (Signed)
BP controlled on the Norvasc 2.5mg /d + low sodium diet etc... Continue same & monitor at home... We will f/u CXR today & she had recent blood work 2/29> reviewed w/ pt:  FLP looks great, Chems normal, CBC OK, TSH OK.Marland KitchenMarland Kitchen

## 2010-07-19 NOTE — Progress Notes (Signed)
Summary: waiting on call back  Phone Note Outgoing Call   Summary of Call: lmomtcb for pt to review her lab results----per SN----chol 201, tg 60, hdl,106, ldl 79---looks great----chems were normal, cbc was normal, thyroid was normal----pt has appt on 3/20. Randell Loop [facility]  July 12, 2010 8:51 AM    lmomtcb for pt Randell Loop [facility]  July 13, 2010 12:41 PM   Follow-up for Phone Call        lmomtcb for pt to review lab results---will hold lab results until pt calls back or she has appt on 3/20 and we can go over these results at this appt. Randell Loop CMA  July 14, 2010 4:54 PM

## 2010-07-19 NOTE — Assessment & Plan Note (Signed)
She continues to do well on the ASA alone... Denies CP, rare palpit self limited and Alpraz helps, avoids caffeine, etc... She exercises regularly, no SOB, no edema, no cerebral ischemic symptoms, etc..Marland Kitchen

## 2010-07-19 NOTE — Progress Notes (Signed)
  Subjective:    Patient ID: [name] [name], female    DOB: [birth year], 75 y.o.   MRN: [id number]  HPI [name] here for a 6 month follow up visit... she has multiple medical problems including HBP;  CAD/ hx palpit;  Divertics/ IBS/ Polyps/ Angiodysplasia;  LBP & FM;  Osteoporosis;  Anxiety...  ~  July 19, 2010:  6 month ROV & doing well overall;  She notes recent hx refractory sinusitis w/ several visits w/ DrRosen & 2 CT's etc; treated w/ antibiotics for >34month & finally better (just in time for allergy season);  Also c/o mild rash> saw DrFHouston w/ cream rx & improved...    BP controlled on low dose Norvasc;  Denies CP, palpit, ch in SOB, dizziness, syncope, edema, etc;      GI stable, no bleeding seen, & last colon 2/03 w/ f/u due 2/13 by DrDBrodie;      Hx LBP, FM, & Osteoporosis> last BMD 2/10 showed TScores -1.3 in Spine, & -1.8 in L FemNeck (due for f/u & we will sched)...    Anxiety>  Still under stress from daugh divorce & grandchild & we will refill her Xanax...    LABS DONE 06/29/10> FLP, Chems, CBC, TSH > all look good & reviewed w/ pt...  ~  HEALTH MAINTENANCE:  she notes brother had shingles and we discussed the shingles vaccine- I rec that she get this shot at the health dept (NOTE: she reported mild case of Shingles on her left arm treated by [facility] in 2011)... she had PNEUMOVAX in 2007... gets yearly FLU SHOTs... GYN = DrGottsegen and he has her on Premarin Rx + vag cream...  Review of Systems  Constitutional:  Denies F/C/S, anorexia, unexpected weight change. HEENT:  No HA, visual changes, earache, nasal symptoms, sore throat, hoarseness. Resp:  No cough, sputum, hemoptysis; no SOB, tightness, wheezing. Cardio:  No CP, DOE, orthopnea, edema. Notes occas palpit... GI:  Denies N/V/D/C or blood in stool; no reflux, abd pain, distention, or gas. GU:  No dysuria, freq, urgency, hematuria, or flank pain. MS:  Denies joint pain, swelling, tenderness, or decr ROM; no neck  pain, back pain, etc. Neuro:  No tremors, seizures, dizziness, syncope, weakness, numbness, gait abn. Skin:  No suspicious lesions; she notes skin rash eval DrHouston. Heme:  No adenopathy, bruising, bleeding. Psyche: Denies confusion, sleep disturbance, hallucinations, anxiety, depression.     Objective:   Physical Exam   WD, WN, [name] in NAD... Vital Signs:  Reviewed...  General:  Alert & oriented; pleasant & cooperative... HEENT:  [place]/AT, EOM-wnl, PERRLA, Fundi-benign, EACs-clear, TMs-wnl, NOSE-clear, THROAT-clear & wnl. Neck:  Supple w/ fair ROM; no JVD; normal carotid impulses w/o bruits; no thyromegaly or nodules palpated; no lymphadenopathy. Chest:  Clear to P & A; without wheezes/ rales/ or rhonchi heard... Heart:  Regular Rhythm; norm S1 & S2 without murmurs/ rubs/ or gallops detected... Abdomen:  Soft & nontender; normal bowel sounds; no organomegaly or masses palpated... Ext:  Normal ROM; without deformities or arthritic changes; no varicose veins, venous insuffic, or edema;  Pulses intact w/o bruits... Neuro:  CNs II-XII intact; motor testing normal; sensory testing normal; gait normal & balance OK... Derm:  No lesions noted; no rash etc... Lymph:  No cervical, supraclavicular, axillary, or inguinal adenopathy palpated...   Assessment & Plan:

## 2010-07-19 NOTE — Assessment & Plan Note (Signed)
GI stable on Prn Librax Prn... She will be due for colonoscopy in 2016 by our records.Marland KitchenMarland Kitchen

## 2010-07-19 NOTE — Assessment & Plan Note (Signed)
Stable on Alprazolam 0.5mg  using 1.5 tabs daily (1/2 in AM, and 1 in eve)... Requests refill #90 written today.Marland KitchenMarland Kitchen

## 2010-07-25 ENCOUNTER — Ambulatory Visit (INDEPENDENT_AMBULATORY_CARE_PROVIDER_SITE_OTHER)
Admission: RE | Admit: 2010-07-25 | Discharge: 2010-07-25 | Disposition: A | Discharge status: Home / Self Care | Payer: Medicare Other | Source: Ambulatory Visit

## 2010-07-25 DIAGNOSIS — M810 Age-related osteoporosis without current pathological fracture: Secondary | ICD-10-CM

## 2010-08-15 ENCOUNTER — Other Ambulatory Visit: Payer: Self-pay | Admitting: Dermatology

## 2010-08-19 ENCOUNTER — Encounter: Payer: Self-pay | Admitting: Pulmonary Disease

## 2010-09-13 NOTE — Assessment & Plan Note (Signed)
[facility]                            CARDIOLOGY OFFICE NOTE   NAME:[name] [name], [name] [name]                     MRN:          [id number]  DATE:09/20/2006                            DOB:          June 30, 1935    REFERRING PHYSICIAN:  Lonzo Cloud. Kriste Basque, MD   REASON FOR PRESENTATION:  Evaluate patient for palpitations.   HISTORY OF PRESENT ILLNESS:  The patient returns for followup of the  above. Since I last saw her, she lost her mother. She was chronically  caring for her. She says that she has had a lot of stress removed though  she has been sad at the death of her mother. She is now sleeping better.  She is no longer having any chest discomfort, which she was having when  her mom was in hospice. She is having a few palpitations, but they are  not particularly problematic. She feels some isolated skipped beats. She  denies any sustained tachyarrhythmias. She has had no pre-syncope or  syncope. She has no PND or orthopnea. She did wear an event monitor in  the fall. This demonstrated no significant dysrhythmias.   PAST MEDICAL HISTORY:  1. Coronary spasm with non-obstructive coronary artery disease in      1999.  2. Anxiety.  3. Back surgery.  4. Tonsillectomy.  5. Appendectomy.   ALLERGIES:  CODEINE AND ERYTHROMYCIN.   MEDICATIONS:  1. Xanax.  2. Norvasc 2.5 mg daily.  3. Trazodone.  4. Fosamax.  5. Aspirin 81 mg daily.  6. Calcium.  7. Multivitamin.   REVIEW OF SYSTEMS:  As stated in the HPI and otherwise negative for  other systems.   PHYSICAL EXAMINATION:  The patient is in no distress. Blood pressure  130/78, heart rate 82 and regular, weight 95 pounds, Body Mass Index is  16.  HEENT: Eyelids unremarkable. Pupils equal, round, and reactive to light.  Fundi not visualized. Oral mucosa is unremarkable.  NECK: No jugular venous distention, waveform within normal limits.  Carotid upstroke brisk and symmetrical. No bruits, no thyromegaly.  LYMPHATICS: No cervical, axillary or inguinal adenopathy.  LUNGS: Clear to auscultation bilaterally.  BACK: No costovertebral angle tenderness.  CHEST: Unremarkable.  HEART: PMI not displaced or sustained. S1, S2 within normal limits. No  S3. No S4. No clicks, rubs or murmurs.  ABDOMEN: Flat, positive bowel sounds, normal in frequency and pitch. No  bruits. No rebounds. No guarding. No midline pulsatile mass. No  organomegaly.  SKIN: No rashes, no nodules.  EXTREMITIES: 2+ pulses throughout. No edema.   EKG: Sinus rhythm, left axis deviation, left anterior fascicular block,  poor anterior R-wave progression. No acute ST-T wave changes.   ASSESSMENT/PLAN:  1. Palpitations. The patient is no longer having any severe      symptomatic palpitations. No further arrhythmia workup is      warranted. She will let me know if she has any increasing symptoms.  2. Coronary spasm. There has been no recurrence of this. She will      continue on the calcium channel blocker.  3. Followup. I gave the patient the option  of being seen in this      clinic p.r.n. She wishes to come back yearly.     Rollene Rotunda, MD, [facility]  Electronically Signed    JH/MedQ  DD: 09/20/2006  DT: 09/20/2006  Job #: [id number]   cc:   Lonzo Cloud. Kriste Basque, MD

## 2010-09-13 NOTE — Assessment & Plan Note (Signed)
[facility]                            CARDIOLOGY OFFICE NOTE   NAME:Mood, VERBIE BABIC                     MRN:          [id number]  DATE:09/30/2007                            DOB:          10-10-1935    PRIMARY CARE PHYSICIAN:  Lonzo Cloud. Kriste Basque, MD.   REASON FOR PRESENTATION:  Evaluate patient with palpitations and  coronary spasm.   HISTORY OF PRESENT ILLNESS:  The patient is 75 years old.  She has done  well from a cardiovascular standpoint over the past year, though she has  had problems with bronchitis and shingles.  She has not had any new  palpitations, though she will occasionally get these.  These are short-  lived and not sustained.  She has had no presyncope or syncope.  She has  had one episode of working gripping chest discomfort in March.  This was  severe, substernal, lasting about 10 minutes.  It was at rest.  It went  away, and she has not had any further episodes.  She is active,  exercising in the gym three times a week and doing other things the rest  of the week.  With this activity, she cannot bring on any chest  discomfort.  She has no new shortness of breath.  Denies any PND or  orthopnea.   PAST MEDICAL HISTORY:  1. Coronary artery spasm and nonobstructive coronary disease in 1999.  2. Palpitations.  3. Anxiety.  4. Back surgery.  5. Tonsillectomy.  6. Appendectomy.   ALLERGIES:  1. CODEINE.  2. ERYTHROMYCIN.   MEDICATIONS:  1. Xanax 0.5 mg b.i.d.  2. Norvasc 2.5 mg daily.  3. Trazodone 100 mg nightly.  4. Fosamax.  5. Aspirin 81 mg daily.  6. Calcium.  7. Centrum Silver.  8. Protegra  9. Fish oil.  10.Premarin.  11.Terconazole vaginal.   REVIEW OF SYSTEMS:  As stated in the HPI, and otherwise negative for  other systems.   PHYSICAL EXAMINATION:  GENERAL:  The patient is in no distress.  VITAL SIGNS:  Blood pressure 113/70, heart rate 74 and regular, weight  100 pound.  NECK:  No jugulovenous distention  at 45 degrees.  Carotid upstrokes  brisk and symmetrical.  No bruits, no thyromegaly.  LYMPHATICS:  No adenopathy.  LUNGS:  Clear to auscultation bilaterally.  CHEST:  Unremarkable.  HEART:  PMI not displaced or sustained, S1-S2 within normal limits.  No  S3, no S4, no clicks, no rubs, no murmurs.  ABDOMEN:  Flat, positive bowel sounds.  Normal in frequency and pitch.  No bruits, no rebound, no guarding, no midline pulsatile mass, no  organomegaly.  SKIN:  No rashes, no nodules.  EXTREMITIES:  2+ pulses, no edema.   EKG sinus rhythm, rate 73, left axis deviation, left anterior fascicular  block, poor anterior R wave progression, no acute ST wave changes.   ASSESSMENT/PLAN:  1. Palpitations.  The patient has had no increase in these.  She has      occasional nonsustained skipped beats.  No further therapy or      evaluation is warranted.  2. Chest discomfort.  The patient had one episode of chest discomfort.      This may have been gastrointestinal.  I do not suspect it was      cardiac in etiology, though she will let me know if she has any      increasing pattern of this.   FOLLOW UP:  She can come back to this clinic as needed, but would prefer  yearly follow-up and I will arrange this.     Rollene Rotunda, MD, [facility]  Electronically Signed    JH/MedQ  DD: 09/30/2007  DT: 09/30/2007  Job #: [id number]   cc:   Lonzo Cloud. Kriste Basque, MD

## 2010-09-16 NOTE — Op Note (Signed)
NAME:  [name] [name], [name] [name]                        ACCOUNT NO.:  [id number]   MEDICAL RECORD NO.:  [id number]                   PATIENT TYPE:  AMB   LOCATION:  ENDO                                 FACILITY:  [facility]   PHYSICIAN:  Lina Sar, M.D. [facility]               DATE OF BIRTH:  09/07/1935   DATE OF PROCEDURE:  07/10/2003  DATE OF DISCHARGE:                                 OPERATIVE REPORT   NAME OF PROCEDURE:  Endoscopic retrograde cholangiopancreatography.   INDICATIONS:  This 75 year old white female has had persistent left upper  quadrant abdominal pain since laparoscopic cholecystectomy in November 2004.  There was also associated mild elevation of amylase and lipase initially  which has resolved on last test on June 10, 2003.  Use of PPI's,  specifically Aciphex 20 mg a day, has not seemed to improve her symptoms.  On CT scan of the abdomen her pancreas appeared normal.  It was not clear  whether she may not be having a biliary pancreatitis or sphincter of Oddi  dysfunction causing low-grade pancreatitis.  She has been on Pancrease  tablets as well as antispasmodic regimen for IBS.  She has lost about 6  pounds since last appointment three weeks ago.  Because of persistent pain  and unclear etiology, as well as the possibility of a low-grade biliary  pancreatitis she is undergoing ERCP and possibly sphincterotomy.   ENDOSCOPE:  Olympus single chamber ____________endoscope.   SEDATION:  1. Versed 10 mg IV.  2. Fentanyl 100 mcg IV.  3. Glucagon 0.5 mg IV.   FINDINGS:  The Olympus single chamber side viewing endoscope passed blindly  through the esophagus, through the stomach, pyloric channel, into the  duodenum.  The pancreas was identified.  It was very small and normal-  appearing.  No evidence of edema.  Bile was exuding in small amounts.  Next  to the papilla was a rather large periampullary diverticulum.  The papilla  was cannulated without difficulty selectively into  the common bile duct  which showed normal appearance with diameter of about 5 mm.  Cystic ducts  were non-filled, as well as intrahepatic radicals.  Fluoroscopic guidance  was provided by Dr. Carey Bullocks.  Irregular mucosa was noted in the  distal common bile duct by Dr. Purcell Mouton although it was not clear whether this  was actually a structural abnormality.  For this reason, a small  sphincterotomy was performed which allowed the 8.5 mm Wilson-Cook balloon to  pass up to the common bile duct and common hepatic ducts, and with the  partial insufflation of the balloon, the duct was swept several times with  recovery of normal-appearing bowel without evidence of any particulate  matter.  Repeat spot films of the distal common bile duct did not show any  filling defect.  The patient tolerated the procedure well.   IMPRESSION:  1. Status post cholecystectomy.  2. Normal-appearing common bile  duct, status post sweep with 8.5 mm balloon,     status post endoscopic sphincterotomy with no evidence of retained     stones.   PLAN:  1. The patient will continue on a low fat diet with dietary supplements, as     well as on the pancreatic enzyme     supplements.  2. Further evaluation depends on her response to the diet and     antispasmodics.  I believe we may be dealing with a functional abdominal     pain.                                               Lina Sar, M.D. [facility]    DB/MEDQ  D:  07/10/2003  T:  07/10/2003  Job:  [id number]   cc:   Lonzo Cloud. Kriste Basque, M.D. [facility]   Abigail Miyamoto, M.D.  [address]  [place]  [place] [address]  Fax: [phone]

## 2010-09-16 NOTE — Op Note (Signed)
NAME:  [name] [name], [name] [name]                        ACCOUNT NO.:  [id number]   MEDICAL RECORD NO.:  [id number]                   PATIENT TYPE:  AMB   LOCATION:  DAY                                  FACILITY:  [facility]   PHYSICIAN:  Abigail Miyamoto, M.D.              DATE OF BIRTH:  Oct 11, 1935   DATE OF PROCEDURE:  03/05/2003  DATE OF DISCHARGE:                                 OPERATIVE REPORT   PREOPERATIVE DIAGNOSIS:  Symptomatic cholelithiasis.   POSTOPERATIVE DIAGNOSIS:  Symptomatic cholelithiasis.   SURGICAL PROCEDURE:  Laparoscopic cholecystectomy with intraoperative  cholangiogram.   SURGEON:  Douglas A. Magnus Ivan, M.D.   ASSISTANT:  Sheppard Plumber. Earlene Plater, M.D.   ANESTHESIA:  General endotracheal anesthesia.   ESTIMATED BLOOD LOSS:  Minimal.   FINDINGS:  The patient was found to have a normal cholangiogram.   PROCEDURE IN DETAIL:  The patient brought to the operating room and  identified as [name] [name].  She was placed supine on the operating table,  and general endotracheal anesthesia was induced.  Her abdomen was then  prepped and draped in the usual sterile fashion.  Using a #15 blade, a small  transverse incision was made below the umbilicus.  The incision was carried  down through the fascia which was then opened with a scalpel.  A hemostat  was then used to pass through the peritoneal cavity.  A 0 Vicryl pursestring  suture was then placed around the fascial opening.  The Hasson port was  placed through the opening, and insufflation of the abdomen was begun.  A 12  mm port was then placed in the patient's epigastrium, and two 5 mm ports  were placed in the patient's right flank under direct vision.  The  gallbladder was then grasped and retracted above the liver bed.  Dissection  was then carried out at the base of the gallbladder.  The cystic duct was  dissected out.  The gallbladder itself was found to be imbedded in a deep  cleft in the liver.  Once the cystic duct  was dissected out, it was clipped  once distally.  It was then partly opened with laparoscopic scissors.  An  angiocatheter was then inserted in the right upper quadrant under direct  vision.  The cholangiocatheter was passed through this and placed into the  opening in the cystic duct.  A cholangiogram was then performed under direct  fluoroscopy.  Contrast was seen to flow easily into the entire biliary  system and duodenum without evidence or abnormality.  The cholangiocatheter  was then removed.  The cystic duct was clipped three times proximally and  transected with the scissors.  The cystic artery was clipped twice  proximally, once distally, and transected as well.  The gallbladder was then  slowly dissected free from the liver bed with the electrocautery.  Once the  gallbladder was free from the liver bed, it was removed through  the incision  at the umbilicus.  The 0 Vicryl at the umbilicus was tied in place, closing  the fascial defect.  Hemostasis appeared to be achieved in the liver bed.  The abdomen was then irrigated with normal saline.  All ports were removed  under direct vision, and the abdomen was deflated.  All incisions were  anesthetized with 0.25% Marcaine and then with closed  with 4-0 Monocryl subcuticular sutures.  Steri-Strips, gauze, and tape were  then applied.  The patient tolerated the procedure well.  All sponge,  needle, and instrument counts were correct at the end of the procedure.  The  patient was then extubated in the operating room and taken in stable  condition to the recovery room.                                               Abigail Miyamoto, M.D.    DB/MEDQ  D:  03/05/2003  T:  03/05/2003  Job:  [id number]

## 2010-09-16 NOTE — Assessment & Plan Note (Signed)
[facility]                              CARDIOLOGY OFFICE NOTE   NAME:[name], [name] MORRE                     MRN:          [id number]  DATE:03/13/2006                            DOB:          06/13/1935    PRIMARY CARE PHYSICIAN:  Lonzo Cloud. Kriste Basque, M.D.   REASON FOR PRESENTATION:  Evaluate the patient's coronary spasm.   HISTORY OF PRESENT ILLNESS:  The patient presents for follow-up of the above  and for palpitations.  Last year when I saw her she was having palpitations  that woke her in her sleep.  However, these have progressed now to be 5/7  nights.  She gets anxious and cannot get back to sleep with these.  They do  not seem to be as problematic during the day, though she still has them.  They have not resulted in presyncope or syncope.  She has had no chest pain  or shortness of breath.  She has had no PND or orthopnea.   PAST MEDICAL HISTORY:  1. Coronary spasm with nonobstructive coronary disease.  2. Anxiety.  3. Back surgery.  4. Tonsillectomy.  5. Adenoidectomy.   ALLERGIES:  1. CODEINE.  2. ERYTHROMYCIN.   MEDICATIONS:  1. Xanax 0.25 mg q.a.m. and 0.5 mg q.p.m.  2. Norvasc 2.5 mg daily.  3. Trazodone 100 mg q.p.m.  4. Fosamax.  5. Aspirin 81 mg daily.  6. Calcium.  7. Multivitamin.  8. Minerals.   REVIEW OF SYSTEMS:  As stated in the HPI, otherwise, negative for other  systems.   PHYSICAL EXAMINATION:  GENERAL:  The patient is in no distress.  VITAL SIGNS:  Blood pressure 127/71, heart rate 84 and regular, weight 98  pounds, body mass index 17.  HEENT:  Eyes unremarkable.  Pupils equal, round, and reactive to light.  Fundi not visualized.  Oral mucosa unremarkable.  NECK:  No jugular venous distention.  Wave form within normal limits.  Carotid upstroke brisk and symmetric, no bruits.  No thyromegaly.  LYMPHATICS:  Normal.  LUNGS:  Clear to auscultation bilaterally.  BACK:  No costovertebral angle tenderness.  CHEST:   Unremarkable.  HEART:  PMI not displaced or sustained.  S1 and S2 within normal limits.  No  S3, no S4, no clicks, no rubs, no murmurs.  ABDOMEN:  Flat.  Positive bowel sounds.  Normal in frequency and pitch.  No  bruits, no rebound, no guarding.  No midline pulsatile mass.  No  hepatomegaly, no splenomegaly.  SKIN:  No rashes.  No nodules.  EXTREMITIES:  2+ pulses throughout.  No edema, no cyanosis, no clubbing.  NEUROLOGIC:  Oriented to person, place, and time.  Cranial nerves II-XII  grossly intact.  Motor grossly intact.   LABORATORY DATA:  EKG:  Sinus rhythm, rate 84, left axis deviation, left  anterior fascicular block, poor anterior R-wave progression, no acute ST/T-  wave changes.  No change from previous EKGs.   ASSESSMENT/PLAN:  1. Palpitations.  These are now more symptomatic.  As we discussed last      year, if this occurred, we were going  to apply an event monitor and I      will do this for two weeks.  Further evaluation will be based on the      results of this and future symptoms, though I think we are approaching      the need for further management.  2. Coronary spasms.  She is having no chest pain.  She will continue with      carrying the nitroglycerin and stay on the Norvasc.  3. Follow-up.  Will see her back in one year if she is doing okay, or      sooner based on the results of the event monitor.     Rollene Rotunda, MD, [facility]  Electronically Signed    JH/MedQ  DD: 03/13/2006  DT: 03/14/2006  Job #: [id number]   cc:   Lonzo Cloud. Kriste Basque, MD

## 2010-09-28 ENCOUNTER — Ambulatory Visit (INDEPENDENT_AMBULATORY_CARE_PROVIDER_SITE_OTHER): Payer: Medicare Other | Admitting: Obstetrics and Gynecology

## 2010-09-28 DIAGNOSIS — B373 Candidiasis of vulva and vagina: Secondary | ICD-10-CM

## 2010-09-28 DIAGNOSIS — R82998 Other abnormal findings in urine: Secondary | ICD-10-CM

## 2010-09-28 DIAGNOSIS — N390 Urinary tract infection, site not specified: Secondary | ICD-10-CM

## 2010-09-28 DIAGNOSIS — N898 Other specified noninflammatory disorders of vagina: Secondary | ICD-10-CM

## 2010-10-06 ENCOUNTER — Other Ambulatory Visit (INDEPENDENT_AMBULATORY_CARE_PROVIDER_SITE_OTHER): Payer: Medicare Other

## 2010-10-06 DIAGNOSIS — Z5189 Encounter for other specified aftercare: Secondary | ICD-10-CM

## 2010-10-11 ENCOUNTER — Other Ambulatory Visit (INDEPENDENT_AMBULATORY_CARE_PROVIDER_SITE_OTHER): Payer: Medicare Other

## 2010-10-11 DIAGNOSIS — R82998 Other abnormal findings in urine: Secondary | ICD-10-CM

## 2010-10-21 ENCOUNTER — Encounter: Payer: Self-pay | Admitting: Cardiology

## 2010-10-21 ENCOUNTER — Other Ambulatory Visit: Payer: Medicare Other

## 2010-10-21 ENCOUNTER — Ambulatory Visit (INDEPENDENT_AMBULATORY_CARE_PROVIDER_SITE_OTHER): Payer: Medicare Other | Admitting: Cardiology

## 2010-10-21 VITALS — BP 144/75 | HR 84 | Ht 63.0 in | Wt 100.0 lb

## 2010-10-21 DIAGNOSIS — I10 Essential (primary) hypertension: Secondary | ICD-10-CM

## 2010-10-21 DIAGNOSIS — R002 Palpitations: Secondary | ICD-10-CM

## 2010-10-21 DIAGNOSIS — I2510 Atherosclerotic heart disease of native coronary artery without angina pectoris: Secondary | ICD-10-CM

## 2010-10-21 MED ORDER — NITROGLYCERIN 0.4 MG SL SUBL: 0.4000 mg | SUBLINGUAL_TABLET | SUBLINGUAL | Status: DC | PRN

## 2010-10-21 NOTE — Patient Instructions (Signed)
Follow up in 1 year with Dr Antoine Poche.  You will receive a letter in the mail 2 months before you are due.  Please call us when you receive this letter to schedule your follow up appointment. Continue your current medications as listed
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
## 2010-10-21 NOTE — Progress Notes (Signed)
HPI The patient presents for followup of hypertension and palpitations. She continues to have anxiety and stress but she said no acute cardiovascular problems recently. She denies any chest pressure, neck or arm discomfort. She is not having any new palpitations and rarely feels these. She's had no presyncope or syncope. She remains active exercising routinely. She has no new shortness of breath, PND or orthopnea. She's had no weight gain or edema.  Allergies  Allergen Reactions  . Codeine     REACTION: NAUSEA  . Erythromycin     REACTION: NAUSEA    Current Outpatient Prescriptions  Medication Sig Dispense Refill  . ALPRAZolam (XANAX) 0.5 MG tablet Take 1/2 to 1 tablet by mouth three times daily as directed  90 tablet  5  . amLODipine (NORVASC) 2.5 MG tablet Take 2.5 mg by mouth daily.        Marland Kitchen aspirin 81 MG tablet Take 81 mg by mouth daily.        . calcium carbonate (CALCIUM 600) 600 MG TABS Take 4 tablets by mouth daily       . cetirizine (ZYRTEC) 10 MG tablet Take 10 mg by mouth daily.        . clidinium-chlordiazePOXIDE (LIBRAX) 2.5-5 MG per capsule Take 1 capsule by mouth 3 (three) times daily as needed. For abd cramping       . conjugated estrogens (PREMARIN) vaginal cream Apply 3 times weekly       . cyclobenzaprine (FLEXERIL) 10 MG tablet Take 10 mg by mouth 3 (three) times daily as needed.        . fish oil-omega-3 fatty acids 1000 MG capsule Take 1 g by mouth daily.        . Multiple Vitamins-Minerals (PROTEGRA PO) Take by mouth daily.        . multivitamin (THERAGRAN) per tablet Take 1 tablet by mouth daily.        . nitroGLYCERIN (NITROSTAT) 0.4 MG SL tablet Place 0.4 mg under the tongue every 5 (five) minutes as needed. May repeat 3 times       . traZODone (DESYREL) 100 MG tablet Take 100 mg by mouth at bedtime.        Marland Kitchen DISCONTD: terconazole (TERAZOL 7) 0.4 % vaginal cream Use 1 time per week         Past Medical History  Diagnosis Date  . HTN (hypertension)   .  Palpitations     Past Surgical History  Procedure Date  . Cholecystectomy 03/2003  . Tonsillectomy   . Lumbar laminectomy     ROS:  As stated in the HPI and negative for all other systems.  PHYSICAL EXAM BP 144/75  Pulse 84  Ht 5\' 3"  (1.6 m)  Wt 100 lb (45.36 kg)  BMI 17.71 kg/m2 GENERAL:  Well appearing HEENT:  Pupils equal round and reactive, fundi not visualized, oral mucosa unremarkable NECK:  No jugular venous distention, waveform within normal limits, carotid upstroke brisk and symmetric, no bruits, no thyromegaly LYMPHATICS:  No cervical, inguinal adenopathy LUNGS:  Clear to auscultation bilaterally BACK:  No CVA tenderness CHEST:  Unremarkable HEART:  PMI not displaced or sustained,S1 and S2 within normal limits, no S3, no S4, no clicks, no rubs, no murmurs ABD:  Flat, positive bowel sounds normal in frequency in pitch, no bruits, no rebound, no guarding, no midline pulsatile mass, no hepatomegaly, no splenomegaly EXT:  2 plus pulses throughout, no edema, no cyanosis no clubbing SKIN:  No rashes, small firm  mildly tender nodule on the right thigh NEURO:  Cranial nerves II through XII grossly intact, motor grossly intact throughout PSYCH:  Cognitively intact, oriented to person place and time   EKG: Normal sinus rhythm, rate 84, leftward axis, poor anterior R-wave progression, no acute ST-T wave changes  ASSESSMENT AND PLAN

## 2010-10-21 NOTE — Assessment & Plan Note (Signed)
She has had no new symptoms. No change in therapy is indicated. She'll continue on the meds as listed.

## 2010-10-28 ENCOUNTER — Other Ambulatory Visit: Payer: Medicare Other

## 2011-01-18 ENCOUNTER — Encounter: Payer: Self-pay | Admitting: Pulmonary Disease

## 2011-01-23 ENCOUNTER — Ambulatory Visit (INDEPENDENT_AMBULATORY_CARE_PROVIDER_SITE_OTHER): Payer: Medicare Other | Admitting: Pulmonary Disease

## 2011-01-23 ENCOUNTER — Encounter: Payer: Self-pay | Admitting: Pulmonary Disease

## 2011-01-23 DIAGNOSIS — IMO0001 Reserved for inherently not codable concepts without codable children: Secondary | ICD-10-CM

## 2011-01-23 DIAGNOSIS — I10 Essential (primary) hypertension: Secondary | ICD-10-CM

## 2011-01-23 DIAGNOSIS — M810 Age-related osteoporosis without current pathological fracture: Secondary | ICD-10-CM

## 2011-01-23 DIAGNOSIS — M545 Low back pain: Secondary | ICD-10-CM

## 2011-01-23 DIAGNOSIS — F411 Generalized anxiety disorder: Secondary | ICD-10-CM

## 2011-01-23 DIAGNOSIS — I2510 Atherosclerotic heart disease of native coronary artery without angina pectoris: Secondary | ICD-10-CM

## 2011-01-23 DIAGNOSIS — K5730 Diverticulosis of large intestine without perforation or abscess without bleeding: Secondary | ICD-10-CM

## 2011-01-23 DIAGNOSIS — J309 Allergic rhinitis, unspecified: Secondary | ICD-10-CM

## 2011-01-23 DIAGNOSIS — R002 Palpitations: Secondary | ICD-10-CM

## 2011-01-23 MED ORDER — AMLODIPINE BESYLATE 2.5 MG PO TABS: 2.5000 mg | ORAL_TABLET | Freq: Every day | ORAL | Status: DC

## 2011-01-23 MED ORDER — TRAZODONE HCL 100 MG PO TABS: 100.0000 mg | ORAL_TABLET | Freq: Every day | ORAL | Status: DC

## 2011-01-23 MED ORDER — ALPRAZOLAM 0.5 MG PO TABS: ORAL_TABLET | ORAL | Status: DC

## 2011-01-23 NOTE — Progress Notes (Signed)
Subjective:    Patient ID: [name] [name], female    DOB: 10/29/1935, 75 y.o.   MRN: [id number]  HPI  [name] here for a 6 month follow up visit... she has multiple medical problems including HBP;  CAD/ hx palpit;  Divertics/ IBS/ Polyps/ Angiodysplasia;  LBP & FM;  Osteoporosis;  Anxiety...  ~  July 19, 2010:  6 month ROV & doing well overall;  She notes recent hx refractory sinusitis w/ several visits w/ DrRosen & 2 CT's etc; treated w/ antibiotics for >2month & finally better (just in time for allergy season);  Also c/o mild rash> saw DrFHouston w/ cream rx & improved...    BP controlled on low dose Norvasc;  Denies CP, palpit, ch in SOB, dizziness, syncope, edema, etc;      GI stable, no bleeding seen, & last colon 2/03 w/ f/u due 2/13 by DrDBrodie;      Hx LBP, FM, & Osteoporosis> last BMD 2/10 showed TScores -1.3 in Spine, & -1.8 in L FemNeck (due for f/u & we will sched)...    Anxiety>  Still under stress from daugh divorce & grandchild & we will refill her Xanax...    LABS DONE 06/29/10> FLP, Chems, CBC, TSH > all look good & reviewed w/ pt...  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    CAD & Palpit> on ASA & FishOil; notes some incr palpit w/ stress but Rebeca Allegra helps; knows to avoid caffeine etc; she saw DrHochrein 6/12> doing satis w/o new symptoms, no changes made...    Divertics & Colon Polyps> on Librax; followed by DrDBrodie w/ hx IBS and f/u colon due in 2013...    Hx angiodysplasia> as noted, she denies any recen bleeding and Hg has been stable...    Hx LBP/ FM> on Flexeril prn and she copes very well...    Osteopenia> on Calcium, MVI, &  Fosamax drug holiday since 3/11 per DrGottsegen & repeat BMD 3/12 showed TScore -1.8 in Spine (see below)...    Anxiety> on Xanax Prn & Desyrel 100mg /d; she is under a lot of stress still...          Problem List:  Hx of SINUSITIS (ICD-473.9) - uses ZYRTEK OTC, Saline nasal wash, & FLONASE as needed... she notes that her allergies are bothering her and she saw DrESL for allergy re-eval 7/12> still on shots...  Hx of PNEUMONIA (ICD-486) >> resolved ~  CXR 3/12 showed clear lungs, NAD...  HYPERTENSION (ICD-401.9) - controlled on NORVASC 2.5mg /d... tol well... BP=136/82... denies HA, fatigue, visual changes, CP, palipit, syncope, dyspnea, edema, etc...  CORONARY ARTERY DISEASE (ICD-414.00) & Hx of PALPITATIONS (ICD-785.1) - on ASA 81mg /d, and FISH OIL daily... hx coronary spasm w/ non-obstructive CAD on cath 1999 (20-30% diagonal branch LAD only)... sl anterobasal HK & EF=59%... she reports some incr palpit w/ stress- Alpraz 0.5mg  helps... ~  She is followed by DrHochrein & seen yearly> OV 6/12 reviewed==> stable, no new symptoms, no changes made...  DIVERTICULOSIS OF COLON (ICD-562.10), IBS (ICD-564.1), & COLONIC POLYPS (ICD-211.3) >> ~  colonoscopy 12/03 by DrDBrodie  showing divertics only... f/u planned 3yrs... ~  eval 3/09 for hematochezia w/ Hg 13.5, Fe 103, repeat colon w/ 3mm polyp= hyperplastic + angiodysplasia... f/u planned 75yrs... ~  Feb10: notes some increased abd cramping/ IBS- we will refill her LIBRAX for Prn use...  Hx of COLONIC ANGIODYSPLASIA (ZOX-096.04) - as abov; no recent bleeding noted & Hg stable...  BACK PAIN, LUMBAR (ICD-724.2) - S/P LLam L4-5 yrs ago... ortho eval 9/06 by DrApplington w/ MRI showing DDD... conservative rx...  FIBROMYALGIA (ICD-729.1) - resting OK w/ TRAZADONE 100mg Qhs & FLEXERIL 10mg  Tid Prn...  OSTEOPOROSIS (ICD-733.00) - prev on FOSAMAX w/ D (she insisted on this Rx), +Calcium, +MVI...  ~  BMD 1/08 was improved w/ TScores -1.5 to -2.1 on FOSAMAX  therapy...  ~  7/09:  she wants to discuss stopping Fosamax in favor of Vit K which she read in "[employer]: Personal" newsletter- mult questions answered... we will check Vit D level (normal= 63) and I rec that she continue the Fosamax, calcium supplements, multivit therapy... ~  2/10:  f/u BMD showed TScores -1.3 in Spine, & -1.8 in left FemNeck = improved... ~  3/11:  DrGottsegen placed her on a Bisphos drug holiday... ~  3/12:  f/u BMD showed TScores -1.8 in Spine, & -1.5 in the left FemNeck  ANXIETY (ICD-300.00) - uses ALPRAZOLAM 0.5mg  Tid Prn...  HEALTH MAINTENANCE:  she notes brother had shingles and we discussed the shingles vaccine- I rec that she get this shot at the health dept (NOTE: she reported mild case of Shingles on her left arm treated by [facility] in 2011)... she had PNEUMOVAX in 2007... gets yearly FLU SHOTs... GYN = DrGottsegen and he has her on Premarin Rx + vag cream...   Past Surgical History  Procedure Date  . Cholecystectomy 03/2003  . Tonsillectomy     as a child  . Lumbar laminectomy     Outpatient Encounter Prescriptions as of 01/23/2011  Medication Sig Dispense Refill  . ALPRAZolam (XANAX) 0.5 MG tablet Take 1/2 to 1 tablet by mouth three times daily as directed  90 tablet  5  . amLODipine (NORVASC) 2.5 MG tablet Take 2.5 mg by mouth daily.        Marland Kitchen aspirin 81 MG tablet Take 81 mg by mouth daily.        . calcium carbonate (CALCIUM 600) 600 MG TABS Take 4 tablets by mouth daily       . cetirizine (ZYRTEC) 10 MG tablet Take 10 mg by mouth daily.        . clidinium-chlordiazePOXIDE (LIBRAX) 2.5-5 MG per capsule Take 1 capsule by mouth 3 (three) times daily as needed. For abd cramping       . conjugated estrogens (PREMARIN) vaginal cream Apply 3 times weekly       . cyclobenzaprine (FLEXERIL) 10 MG tablet Take 10 mg by mouth 3 (three) times daily as needed.        . fish oil-omega-3 fatty acids 1000 MG capsule Take 1 g by mouth daily.        . Multiple  Vitamins-Minerals (PROTEGRA PO) Take by mouth daily.        . multivitamin (THERAGRAN) per tablet Take 1 tablet by mouth daily.        . nitroGLYCERIN (NITROSTAT) 0.4 MG SL tablet Place 1 tablet (0.4 mg total) under the tongue every 5 (five) minutes as needed. May repeat 3 times  25 tablet  11  . traZODone (DESYREL) 100 MG tablet Take  100 mg by mouth at bedtime.          Allergies  Allergen Reactions  . Codeine     REACTION: NAUSEA  . Erythromycin     REACTION: NAUSEA    Current Medications, Allergies, Past Medical History, Past Surgical History, Family History, and Social History were reviewed in [employer] record.    Review of Systems  Constitutional:  Denies F/C/S, anorexia, unexpected weight change. HEENT:  No HA, visual changes, earache, nasal symptoms, sore throat, hoarseness. Resp:  No cough, sputum, hemoptysis; no SOB, tightness, wheezing. Cardio:  No CP, DOE, orthopnea, edema. Notes occas palpit... GI:  Denies N/V/D/C or blood in stool; no reflux, abd pain, distention, or gas. GU:  No dysuria, freq, urgency, hematuria, or flank pain. MS:  Denies joint pain, swelling, tenderness, or decr ROM; no neck pain, back pain, etc. Neuro:  No tremors, seizures, dizziness, syncope, weakness, numbness, gait abn. Skin:  No suspicious lesions; she notes skin rash eval DrHouston. Heme:  No adenopathy, bruising, bleeding. Psyche: Denies confusion, sleep disturbance, hallucinations, anxiety, depression.     Objective:   Physical Exam   WD, WN, [name] in NAD... Vital Signs:  Reviewed...  General:  Alert & oriented; pleasant & cooperative... HEENT:  [place]/AT, EOM-wnl, PERRLA, Fundi-benign, EACs-clear, TMs-wnl, NOSE-clear, THROAT-clear & wnl. Neck:  Supple w/ fair ROM; no JVD; normal carotid impulses w/o bruits; no thyromegaly or nodules palpated; no lymphadenopathy. Chest:  Clear to P & A; without wheezes/ rales/ or rhonchi heard... Heart:  Regular Rhythm; norm  S1 & S2 without murmurs/ rubs/ or gallops detected... Abdomen:  Soft & nontender; normal bowel sounds; no organomegaly or masses palpated... Ext:  Normal ROM; without deformities or arthritic changes; no varicose veins, venous insuffic, or edema;  Pulses intact w/o bruits... Neuro:  CNs II-XII intact; motor testing normal; sensory testing normal; gait normal & balance OK... Derm:  No lesions noted; no rash etc... Lymph:  No cervical, supraclavicular, axillary, or inguinal adenopathy palpated...   Assessment & Plan:   HBP> on Amlodipine2.5mg  & BP well controlled & stable; continue same...     CAD & Palpit> on ASA & FishOil; followed by DrHochrein & seen 6/12> doing satis w/o new symptoms, no changes made...     Divertics & Colon Polyps> on Librax; followed by DrDBrodie w/ hx IBS and f/u colon due in 2013...     Hx Angiodysplasia> she denies any recent bleeding and Hg has been stable...     Hx LBP/ FM> on Flexeril prn and she copes very well...     Osteopenia> on Calcium, MVI, & Fosamax drug holiday since 3/11 per DrGottsegen & repeat BMD 3/12 showed TScore -1.8 in Spine (see below)...     Anxiety> on Xanax Prn & Desyrel 100mg /d; she is under a lot of stress still.Marland KitchenMarland Kitchen

## 2011-02-28 ENCOUNTER — Encounter: Payer: Self-pay | Admitting: Pulmonary Disease

## 2011-02-28 DIAGNOSIS — J309 Allergic rhinitis, unspecified: Secondary | ICD-10-CM | POA: Insufficient documentation

## 2011-02-28 NOTE — Patient Instructions (Signed)
Today we updated your med list in our EPIC system...    Continue your current medications the same...  Meds refilled per request...  Call for any problems...  Let's plan a routine follow up visit in 6 months w/ fasting blood work.Marland KitchenMarland Kitchen

## 2011-05-04 DIAGNOSIS — J309 Allergic rhinitis, unspecified: Secondary | ICD-10-CM | POA: Diagnosis not present

## 2011-05-05 ENCOUNTER — Other Ambulatory Visit: Payer: Self-pay | Admitting: Dermatology

## 2011-05-05 DIAGNOSIS — L259 Unspecified contact dermatitis, unspecified cause: Secondary | ICD-10-CM | POA: Diagnosis not present

## 2011-05-05 DIAGNOSIS — L738 Other specified follicular disorders: Secondary | ICD-10-CM | POA: Diagnosis not present

## 2011-05-05 DIAGNOSIS — L608 Other nail disorders: Secondary | ICD-10-CM | POA: Diagnosis not present

## 2011-05-05 DIAGNOSIS — L820 Inflamed seborrheic keratosis: Secondary | ICD-10-CM | POA: Diagnosis not present

## 2011-05-08 ENCOUNTER — Encounter: Payer: Self-pay | Admitting: Obstetrics and Gynecology

## 2011-05-11 DIAGNOSIS — J309 Allergic rhinitis, unspecified: Secondary | ICD-10-CM | POA: Diagnosis not present

## 2011-05-18 DIAGNOSIS — J309 Allergic rhinitis, unspecified: Secondary | ICD-10-CM | POA: Diagnosis not present

## 2011-05-25 DIAGNOSIS — J309 Allergic rhinitis, unspecified: Secondary | ICD-10-CM | POA: Diagnosis not present

## 2011-06-01 DIAGNOSIS — J309 Allergic rhinitis, unspecified: Secondary | ICD-10-CM | POA: Diagnosis not present

## 2011-06-08 DIAGNOSIS — J309 Allergic rhinitis, unspecified: Secondary | ICD-10-CM | POA: Diagnosis not present

## 2011-06-15 DIAGNOSIS — J309 Allergic rhinitis, unspecified: Secondary | ICD-10-CM | POA: Diagnosis not present

## 2011-06-23 ENCOUNTER — Telehealth: Payer: Self-pay | Admitting: Pulmonary Disease

## 2011-06-23 MED ORDER — AMOXICILLIN-POT CLAVULANATE 875-125 MG PO TABS: 1.0000 | ORAL_TABLET | Freq: Two times a day (BID) | ORAL | Status: AC

## 2011-06-23 MED ORDER — HYDROCOD POLST-CHLORPHEN POLST 10-8 MG/5ML PO LQCR: ORAL | Status: DC

## 2011-06-23 MED ORDER — FIRST-DUKES MOUTHWASH MT SUSP: OROMUCOSAL | Status: DC

## 2011-06-23 NOTE — Telephone Encounter (Signed)
Per SN okay to call in Augmentin 875 mg 1 BID #20 w/ 0 refills, take align while on abx QD, mucinex 2 po BID w/ plenty of fluids, tussionex 4 oz 1 tsp q12hrs prn 0 refills, and MMW #4 OZ 1 TSP swish and swallow 4 times a day 0 refills.  I spoke with spouse since pt was sleeping and advised him of SN;s recs. He voiced his understanding and the medications have been called into the pharmacy. Nothing further was eneded

## 2011-06-23 NOTE — Telephone Encounter (Signed)
I spoke with pt and re-read him SN recs. Nothing further was needed

## 2011-06-23 NOTE — Telephone Encounter (Signed)
Spoke with pt. She is c/o sore throat, dry cough (with foul taste), PND, bloody nasal d/c, and facial pressure/HA- onset was 4 days ago, taking zyrtec with no relief. Would like something called in. She wanted to mention that tussionex and MWW have helped in the past. Please advise thanks! Allergies  Allergen Reactions  . Codeine     REACTION: NAUSEA  . Erythromycin     REACTION: NAUSEA

## 2011-06-27 ENCOUNTER — Encounter: Payer: Self-pay | Admitting: Obstetrics and Gynecology

## 2011-06-27 ENCOUNTER — Ambulatory Visit (INDEPENDENT_AMBULATORY_CARE_PROVIDER_SITE_OTHER): Payer: Medicare Other | Admitting: Obstetrics and Gynecology

## 2011-06-27 DIAGNOSIS — Z780 Asymptomatic menopausal state: Secondary | ICD-10-CM

## 2011-06-27 DIAGNOSIS — N898 Other specified noninflammatory disorders of vagina: Secondary | ICD-10-CM

## 2011-06-27 DIAGNOSIS — N760 Acute vaginitis: Secondary | ICD-10-CM

## 2011-06-27 DIAGNOSIS — N390 Urinary tract infection, site not specified: Secondary | ICD-10-CM

## 2011-06-27 DIAGNOSIS — I341 Nonrheumatic mitral (valve) prolapse: Secondary | ICD-10-CM | POA: Insufficient documentation

## 2011-06-27 DIAGNOSIS — N951 Menopausal and female climacteric states: Secondary | ICD-10-CM

## 2011-06-27 DIAGNOSIS — N952 Postmenopausal atrophic vaginitis: Secondary | ICD-10-CM

## 2011-06-27 DIAGNOSIS — A499 Bacterial infection, unspecified: Secondary | ICD-10-CM

## 2011-06-27 DIAGNOSIS — M8580 Other specified disorders of bone density and structure, unspecified site: Secondary | ICD-10-CM

## 2011-06-27 DIAGNOSIS — M949 Disorder of cartilage, unspecified: Secondary | ICD-10-CM | POA: Diagnosis not present

## 2011-06-27 DIAGNOSIS — M899 Disorder of bone, unspecified: Secondary | ICD-10-CM

## 2011-06-27 LAB — URINALYSIS W MICROSCOPIC + REFLEX CULTURE
Glucose, UA: NEGATIVE mg/dL
Hgb urine dipstick: NEGATIVE
Ketones, ur: NEGATIVE mg/dL
Leukocytes, UA: NEGATIVE
Nitrite: NEGATIVE
Specific Gravity, Urine: 1.01 (ref 1.005–1.030)
pH: 7 (ref 5.0–8.0)

## 2011-06-27 LAB — WET PREP FOR TRICH, YEAST, CLUE

## 2011-06-27 MED ORDER — METRONIDAZOLE 0.75 % VA GEL: 1.0000 | Freq: Two times a day (BID) | VAGINAL | Status: AC

## 2011-06-27 MED ORDER — ESTROGENS, CONJUGATED 0.625 MG/GM VA CREA: TOPICAL_CREAM | VAGINAL | Status: DC

## 2011-06-27 NOTE — Progress Notes (Signed)
Patient came back to see me today for further followup. 2 years ago she was placed on drug holiday for her osteopenia. She had been on Fosamax. She has had no fractures or drug holiday. She continues with calcium and vitamin D. She is due for followup bone density. She is up-to-date on mammograms. She is currently being treated for her sinusitis by her PCP. She has been noticing over the past month an intermittent vaginal odor without itching. She is having no vaginal bleeding. She is having no pelvic pain. She continues to have hot flashes. She does not need hormone replacement. She remains on estrogen cream for atrophic vaginitis. She is getting an excellent result.  ROS: 12 system review done. Pertinent positives above. Other positives include coronary artery disease, irritable bowel syndrome,mitral valve prolapse, allergic rhinitis that is treated and fibromyalgia.  Physical examination: Kennon Portela present. HEENT within normal limits. Neck: Thyroid not large. No masses. Supraclavicular nodes: not enlarged. Breasts: Examined in both sitting midline position. No skin changes and no masses. Abdomen: Soft no guarding rebound or masses or hernia. Pelvic: External: Within normal limits. BUS: Within normal limits. Vaginal:within normal limits. Good estrogen effect. No evidence of cystocele rectocele or enterocele. Cervix: clean. Uterus: Normal size and shape. Adnexa: No masses. Rectovaginal exam: Confirmatory and negative. Extremities: Within normal limits. Wet prep negative.   Assessment: #1. Menopausal symptoms #2. Atrophic vaginitis #3. Osteopenia #4. Bacterial vaginosis Plan: Bone density at PCP. Continuing mammograms. Continue Premarin cream. MetroGel vaginal cream for 5 days.

## 2011-06-27 NOTE — Patient Instructions (Signed)
Scheduled bone density

## 2011-06-29 DIAGNOSIS — J309 Allergic rhinitis, unspecified: Secondary | ICD-10-CM | POA: Diagnosis not present

## 2011-07-06 DIAGNOSIS — J309 Allergic rhinitis, unspecified: Secondary | ICD-10-CM | POA: Diagnosis not present

## 2011-07-10 DIAGNOSIS — H2510 Age-related nuclear cataract, unspecified eye: Secondary | ICD-10-CM | POA: Diagnosis not present

## 2011-07-12 DIAGNOSIS — J309 Allergic rhinitis, unspecified: Secondary | ICD-10-CM | POA: Diagnosis not present

## 2011-07-17 ENCOUNTER — Telehealth: Payer: Self-pay | Admitting: Pulmonary Disease

## 2011-07-17 DIAGNOSIS — J309 Allergic rhinitis, unspecified: Secondary | ICD-10-CM | POA: Diagnosis not present

## 2011-07-17 DIAGNOSIS — F411 Generalized anxiety disorder: Secondary | ICD-10-CM

## 2011-07-17 DIAGNOSIS — E7800 Pure hypercholesterolemia, unspecified: Secondary | ICD-10-CM

## 2011-07-17 DIAGNOSIS — I10 Essential (primary) hypertension: Secondary | ICD-10-CM

## 2011-07-17 DIAGNOSIS — K219 Gastro-esophageal reflux disease without esophagitis: Secondary | ICD-10-CM

## 2011-07-17 DIAGNOSIS — D126 Benign neoplasm of colon, unspecified: Secondary | ICD-10-CM

## 2011-07-17 NOTE — Telephone Encounter (Signed)
Called and spoke with pt and she is aware of labs in the computer for her prior to her appt.

## 2011-07-18 ENCOUNTER — Other Ambulatory Visit (INDEPENDENT_AMBULATORY_CARE_PROVIDER_SITE_OTHER): Payer: Medicare Other

## 2011-07-18 DIAGNOSIS — D126 Benign neoplasm of colon, unspecified: Secondary | ICD-10-CM

## 2011-07-18 DIAGNOSIS — F411 Generalized anxiety disorder: Secondary | ICD-10-CM

## 2011-07-18 DIAGNOSIS — K219 Gastro-esophageal reflux disease without esophagitis: Secondary | ICD-10-CM

## 2011-07-18 DIAGNOSIS — E7800 Pure hypercholesterolemia, unspecified: Secondary | ICD-10-CM | POA: Diagnosis not present

## 2011-07-18 DIAGNOSIS — I10 Essential (primary) hypertension: Secondary | ICD-10-CM

## 2011-07-18 LAB — CBC WITH DIFFERENTIAL/PLATELET
Basophils Absolute: 0 10*3/uL (ref 0.0–0.1)
Basophils Relative: 0.6 % (ref 0.0–3.0)
Hemoglobin: 14.4 g/dL (ref 12.0–15.0)
Lymphocytes Relative: 35.9 % (ref 12.0–46.0)
Monocytes Relative: 11.9 % (ref 3.0–12.0)
Neutro Abs: 2.7 10*3/uL (ref 1.4–7.7)
RBC: 4.17 Mil/uL (ref 3.87–5.11)
WBC: 5.8 10*3/uL (ref 4.5–10.5)

## 2011-07-18 LAB — HEPATIC FUNCTION PANEL
Albumin: 4.1 g/dL (ref 3.5–5.2)
Total Protein: 6.9 g/dL (ref 6.0–8.3)

## 2011-07-18 LAB — BASIC METABOLIC PANEL
BUN: 11 mg/dL (ref 6–23)
CO2: 29 mEq/L (ref 19–32)
Calcium: 9.6 mg/dL (ref 8.4–10.5)
Creatinine, Ser: 0.6 mg/dL (ref 0.4–1.2)
Glucose, Bld: 79 mg/dL (ref 70–99)

## 2011-07-18 LAB — LIPID PANEL
HDL: 95.7 mg/dL (ref >39.00)
Total CHOL/HDL Ratio: 2
Triglycerides: 126 mg/dL (ref 0.0–149.0)

## 2011-07-20 DIAGNOSIS — J309 Allergic rhinitis, unspecified: Secondary | ICD-10-CM | POA: Diagnosis not present

## 2011-07-24 ENCOUNTER — Ambulatory Visit (INDEPENDENT_AMBULATORY_CARE_PROVIDER_SITE_OTHER): Payer: Medicare Other | Admitting: Pulmonary Disease

## 2011-07-24 ENCOUNTER — Encounter: Payer: Self-pay | Admitting: Pulmonary Disease

## 2011-07-24 DIAGNOSIS — M810 Age-related osteoporosis without current pathological fracture: Secondary | ICD-10-CM

## 2011-07-24 DIAGNOSIS — K5730 Diverticulosis of large intestine without perforation or abscess without bleeding: Secondary | ICD-10-CM

## 2011-07-24 DIAGNOSIS — M545 Low back pain: Secondary | ICD-10-CM

## 2011-07-24 DIAGNOSIS — J309 Allergic rhinitis, unspecified: Secondary | ICD-10-CM | POA: Diagnosis not present

## 2011-07-24 DIAGNOSIS — I2510 Atherosclerotic heart disease of native coronary artery without angina pectoris: Secondary | ICD-10-CM

## 2011-07-24 DIAGNOSIS — I10 Essential (primary) hypertension: Secondary | ICD-10-CM | POA: Diagnosis not present

## 2011-07-24 DIAGNOSIS — IMO0001 Reserved for inherently not codable concepts without codable children: Secondary | ICD-10-CM

## 2011-07-24 DIAGNOSIS — D126 Benign neoplasm of colon, unspecified: Secondary | ICD-10-CM

## 2011-07-24 DIAGNOSIS — K5520 Angiodysplasia of colon without hemorrhage: Secondary | ICD-10-CM

## 2011-07-24 DIAGNOSIS — F411 Generalized anxiety disorder: Secondary | ICD-10-CM

## 2011-07-24 MED ORDER — ALPRAZOLAM 0.5 MG PO TABS: ORAL_TABLET | ORAL | Status: DC

## 2011-07-24 NOTE — Patient Instructions (Signed)
Today we updated your med list in our EPIC system...    Continue your current medications the same...  We refilled your Alprazolam per request...  We reviewed your recent blood work & gave you a copy for your records...  Call for any questions...  Let's plan a follow up visit in 6 months, sooner if needed for problems.Marland KitchenMarland Kitchen

## 2011-07-24 NOTE — Progress Notes (Addendum)
Subjective:    Patient ID: [name] [name], female    DOB: 1935/07/09, 76 y.o.   MRN: [id number]  HPI 76 y/o WF here for a 6 month follow up visit... she has multiple medical problems including HBP;  CAD/ hx palpit;  Divertics/ IBS/ Polyps/ Angiodysplasia;  LBP & FM;  Osteoporosis;  Anxiety...  ~  July 19, 2010:  6 month ROV & doing well overall;  She notes recent hx refractory sinusitis w/ several visits w/ DrRosen & 2 CT's etc; treated w/ antibiotics for >671month & finally better (just in time for allergy season);  Also c/o mild rash> saw DrFHouston w/ cream rx & improved...    BP controlled on low dose Norvasc;  Denies CP, palpit, ch in SOB, dizziness, syncope, edema, etc;      GI stable, no bleeding seen, & last colon 2/03 w/ f/u due 2/13 by DrDBrodie;      Hx LBP, FM, & Osteoporosis> last BMD 2/10 showed TScores -1.3 in Spine, & -1.8 in L FemNeck (due for f/u & we will sched)...    Anxiety>  Still under stress from daugh divorce & grandchild & we will refill her Xanax...    LABS DONE 06/29/10> FLP, Chems, CBC, TSH > all look good & reviewed w/ pt...  ~  January 23, 2011:  71mo ROV & notes sl dizzy, lightheaded, no assoc N/V etc;  Notes dry skin & eval by Vaughan Sine fells that her skin has changed, "I have atopic dermatitis" "I was born w/ eczema";  She had allergy eval by DrESL 7/12> Rx Zyrtek, nasal saline, & allergy vaccine;  We reviewed herprev labs    HBP> on Amlodipine2.5mg  & BP= 136/82; denies HA, CP, palpit, SOB, edema, etc...    CAD & Palpit> on ASA & FishOil; notes some incr palpit w/ stress but Rebeca Allegra helps; knows to avoid caffeine etc; she saw DrHochrein 6/12> doing satis w/o new symptoms, no changes made...    Divertics & Colon Polyps> on Librax; followed by DrDBrodie w/ hx IBS and f/u colon due in 2013...    Hx angiodysplasia> as noted, she denies any recen bleeding and Hg has been stable...    Hx LBP/ FM> on Flexeril prn and she copes very well...    Osteopenia> on Calcium, MVI, &  Fosamax drug holiday since 3/11 per DrGottsegen & repeat BMD 3/12 showed TScore -1.8 in Spine (see below)...    Anxiety> on Xanax Prn & Desyrel 100mg /d; she is under a lot of stress still...  ~  July 24, 2011:  71mo ROV & she escribes a bad URI which was hard to shake despite augmentin/ Mucinex/ MMW/ Tussionex/ Align- all called in for her prev; finally better 7 back to baseline;  She had FASTING labs done prior to OV, & requests refill Alprazolam...    HBP> on Amlodipine2.5mg  & BP= 130/72; denies HA, CP, palpit, SOB, edema, etc...    CAD & Palpit> on ASA & FishOil; notes some incr palpit w/ stress but Rebeca Allegra helps; knows to avoid caffeine etc; she saw DrHochrein 6/12> EKG showed NSR, rate84, septal infarct (poor R prog V1-2, otherw neg, NAD; doing satis w/o new symptoms, no changes made...    Divertics & Colon Polyps> on Librax; followed by DrDBrodie w/ hx IBS and f/u colon due in 2013...    Hx angiodysplasia> as noted, she denies any recent bleeding and Hg has been stable...    Hx LBP/ FM> on Flexeril prn and she copes very well.Marland KitchenMarland Kitchen  Osteopenia> on Calcium, MVI, & Fosamax drug holiday since 3/11 per DrGottsegen & repeat BMD 3/12 showed TScore -1.8 in Spine (see below); we decided to leave her off the Fosamax until f/u BMD 3/14...    Anxiety> on Xanax Prn & Desyrel 100mg /d; she is under a lot of stress still w/ husb illness & daugh divorce...          Problem List:  Hx of SINUSITIS (ICD-473.9) - uses ZYRTEK OTC, Saline nasal wash, & FLONASE as needed... she notes that her allergies are bothering her and she saw DrESL for allergy re-eval 7/12> still on shots...  Hx of PNEUMONIA (ICD-486) >> resolved ~  CXR 3/12 showed clear lungs, NAD...  HYPERTENSION (ICD-401.9) - controlled on NORVASC 2.5mg /d... ~  9/12:  BP=136/82 & denies HA, fatigue, visual changes, CP, palipit, syncope, dyspnea, edema, etc... ~  3/13:  BP= 130/72 & she remains largely asymptomatic...  CORONARY ARTERY DISEASE  (ICD-414.00) & Hx of PALPITATIONS (ICD-785.1) - on ASA 81mg /d, and FISH OIL daily... hx coronary spasm w/ non-obstructive CAD on cath 1999 (20-30% diagonal branch LAD only)... sl anterobasal HK & EF=59%... she reports some incr palpit w/ stress- Alpraz 0.5mg  helps... ~  Baseline EKG showed NSR, septal infarct w/ poor R prog V1-2, otherw wnl/ NAD... ~  She is followed by DrHochrein & seen yearly> OV 6/12 reviewed==> stable, no new symptoms, no changes made...  DIVERTICULOSIS OF COLON (ICD-562.10), IBS (ICD-564.1), & COLONIC POLYPS (ICD-211.3) >> ~  colonoscopy 12/03 by DrDBrodie showing divertics only... f/u planned 68yrs... ~  eval 3/09 for hematochezia w/ Hg 13.5, Fe 103, repeat colon w/ 3mm polyp= hyperplastic + angiodysplasia... f/u planned 5yrs... ~  2/10: notes some increased abd cramping/ IBS- we will refill her LIBRAX for Prn use...  Hx of COLONIC ANGIODYSPLASIA (ICD-569.84) - as above; no recent bleeding noted & Hg stable...  BACK PAIN, LUMBAR (ICD-724.2) - S/P LLam L4-5 yrs ago... ortho eval 9/06 by DrApplington w/ MRI showing DDD... conservative rx...  FIBROMYALGIA (ICD-729.1) - resting OK w/ TRAZADONE 100mg Qhs & FLEXERIL 10mg  Tid Prn...  OSTEOPOROSIS (ICD-733.00) - prev on FOSAMAX w/ D (she insisted on this Rx), +Calcium, +MVI...  ~  BMD 1/08 was improved w/ TScores -1.5 to -2.1 on FOSAMAX therapy...  ~  7/09:  she wants to discuss stopping Fosamax in favor of Vit K which she read in "[employer]: Personal" newsletter- mult questions answered... we will check Vit D level (normal= 63) and I rec that she continue the Fosamax, calcium supplements, multivit therapy... ~  2/10:  f/u BMD showed TScores -1.3 in Spine, & -1.8 in left FemNeck = improved... ~  3/11:  DrGottsegen placed her on a Bisphos drug holiday... ~  3/12:  f/u BMD showed TScores -1.8 in Spine, & -1.5 in the left [facility] ~  3/13:  We reviewed prev results 7 decided to continue Calcium, MVI, Vit D, & hold Bisphos therapy  until f/u BMD due 3/14...  ANXIETY (ICD-300.00) - uses ALPRAZOLAM 0.5mg  Tid Prn...  HEALTH MAINTENANCE:  she notes brother had shingles and we discussed the shingles vaccine- I rec that she get this shot at the health dept (NOTE: she reported mild case of Shingles on her left arm treated by [facility] in 2011)... she had PNEUMOVAX in 2007... gets yearly FLU SHOTs... GYN = DrGottsegen and he has her on Premarin Rx + vag cream...   Past Surgical History  Procedure Date  . Cholecystectomy 03/2003  . Tonsillectomy     as a  child  . Lumbar laminectomy   . Cardiac catheterization     Outpatient Encounter Prescriptions as of 07/24/2011  Medication Sig Dispense Refill  . ALPRAZolam (XANAX) 0.5 MG tablet Take 1/2 to 1 tablet by mouth three times daily as directed  90 tablet  5  . amLODipine (NORVASC) 2.5 MG tablet Take 1 tablet (2.5 mg total) by mouth daily.  30 tablet  11  . aspirin 81 MG tablet Take 81 mg by mouth daily.        . calcium carbonate (CALCIUM 600) 600 MG TABS Take 4 tablets by mouth daily       . cetirizine (ZYRTEC) 10 MG tablet Take 10 mg by mouth daily.        . clidinium-chlordiazePOXIDE (LIBRAX) 2.5-5 MG per capsule Take 1 capsule by mouth 3 (three) times daily as needed. For abd cramping       . conjugated estrogens (PREMARIN) vaginal cream Place vaginally 3 (three) times a week. Apply 3 times weekly  42.5 g  5  . cyclobenzaprine (FLEXERIL) 10 MG tablet Take 10 mg by mouth 3 (three) times daily as needed.        Marland Kitchen EPINEPHrine (EPI-PEN) 0.3 mg/0.3 mL DEVI Inject 0.3 mg into the muscle as needed.      . fish oil-omega-3 fatty acids 1000 MG capsule Take 1 g by mouth daily.        . Multiple Vitamins-Minerals (PROTEGRA PO) Take by mouth daily.        . multivitamin (THERAGRAN) per tablet Take 1 tablet by mouth daily.        . nitroGLYCERIN (NITROSTAT) 0.4 MG SL tablet Place 1 tablet (0.4 mg total) under the tongue every 5 (five) minutes as needed. May repeat 3 times  25 tablet  11   . traZODone (DESYREL) 100 MG tablet Take 1 tablet (100 mg total) by mouth at bedtime.  30 tablet  11  . UNABLE TO FIND Allergy shots weekly      . DISCONTD: chlorpheniramine-HYDROcodone (TUSSIONEX PENNKINETIC ER) 10-8 MG/5ML LQCR 1 tsp every 12 hours as needed  120 mL  0  . DISCONTD: Diphenhyd-Hydrocort-Nystatin (FIRST-DUKES MOUTHWASH) SUSP 1 tsp swish and swallow 4 times a day  120 mL  0  . DISCONTD: guaiFENesin (MUCINEX) 600 MG 12 hr tablet Take 1,200 mg by mouth as needed.        Allergies  Allergen Reactions  . Codeine     REACTION: NAUSEA  . Erythromycin     REACTION: NAUSEA  . Other     Environmental allergies    Current Medications, Allergies, Past Medical History, Past Surgical History, Family History, and Social History were reviewed in [employer] record.    Review of Systems  Constitutional:  Denies F/C/S, anorexia, unexpected weight change. HEENT:  No HA, visual changes, earache, nasal symptoms, sore throat, hoarseness. Resp:  No cough, sputum, hemoptysis; no SOB, tightness, wheezing. Cardio:  No CP, DOE, orthopnea, edema. Notes occas palpit... GI:  Denies N/V/D/C or blood in stool; no reflux, abd pain, distention, or gas. GU:  No dysuria, freq, urgency, hematuria, or flank pain. MS:  Denies joint pain, swelling, tenderness, or decr ROM; no neck pain, back pain, etc. Neuro:  No tremors, seizures, dizziness, syncope, weakness, numbness, gait abn. Skin:  No suspicious lesions; she notes skin rash eval DrHouston. Heme:  No adenopathy, bruising, bleeding. Psyche: Denies confusion, sleep disturbance, hallucinations, anxiety, depression.     Objective:   Physical Exam  WD, WN, 76 y/o WF in NAD... Vital Signs:  Reviewed...  General:  Alert & oriented; pleasant & cooperative... HEENT:  [place]/AT, EOM-wnl, PERRLA, Fundi-benign, EACs-clear, TMs-wnl, NOSE-clear, THROAT-clear & wnl. Neck:  Supple w/ fair ROM; no JVD; normal carotid impulses w/o bruits;  no thyromegaly or nodules palpated; no lymphadenopathy. Chest:  Clear to P & A; without wheezes/ rales/ or rhonchi heard... Heart:  Regular Rhythm; norm S1 & S2 without murmurs/ rubs/ or gallops detected... Abdomen:  Soft & nontender; normal bowel sounds; no organomegaly or masses palpated... Ext:  Normal ROM; without deformities or arthritic changes; no varicose veins, venous insuffic, or edema;  Pulses intact w/o bruits... Neuro:  CNs II-XII intact; motor testing normal; sensory testing normal; gait normal & balance OK... Derm:  No lesions noted; no rash etc... Lymph:  No cervical, supraclavicular, axillary, or inguinal adenopathy palpated...  RADIOLOGY DATA:  Reviewed in the EPIC EMR & discussed w/ the patient...  LABORATORY DATA:  Reviewed in the EPIC EMR & discussed w/ the patient...   Assessment & Plan:   HBP> on Amlodipine2.5mg  & BP well controlled & stable; continue same...     CAD & Palpit> on ASA & FishOil; followed by DrHochrein & seen 6/12> doing satis w/o new symptoms, no changes made...     Divertics & Colon Polyps> on Librax; followed by DrDBrodie w/ hx IBS and f/u colon due in 2013...     Hx Angiodysplasia> she denies any recent bleeding and Hg has been stable...     Hx LBP/ FM> on Flexeril prn and she copes very well...     Osteopenia> on Calcium, MVI, & Fosamax drug holiday since 3/11 per DrGottsegen & repeat BMD 3/12 showed TScore -1.8 in Spine (see above)...     Anxiety> on Xanax Prn & Desyrel 100mg /d; she is under a lot of stress still...   Patient's Medications  New Prescriptions   No medications on file  Previous Medications   AMLODIPINE (NORVASC) 2.5 MG TABLET    Take 1 tablet (2.5 mg total) by mouth daily.   ASPIRIN 81 MG TABLET    Take 81 mg by mouth daily.     CALCIUM CARBONATE (CALCIUM 600) 600 MG TABS    Take 4 tablets by mouth daily    CETIRIZINE (ZYRTEC) 10 MG TABLET    Take 10 mg by mouth daily.     CLIDINIUM-CHLORDIAZEPOXIDE (LIBRAX) 2.5-5 MG  PER CAPSULE    Take 1 capsule by mouth 3 (three) times daily as needed. For abd cramping    CONJUGATED ESTROGENS (PREMARIN) VAGINAL CREAM    Place vaginally 3 (three) times a week. Apply 3 times weekly   CYCLOBENZAPRINE (FLEXERIL) 10 MG TABLET    Take 10 mg by mouth 3 (three) times daily as needed.     EPINEPHRINE (EPI-PEN) 0.3 MG/0.3 ML DEVI    Inject 0.3 mg into the muscle as needed.   FISH OIL-OMEGA-3 FATTY ACIDS 1000 MG CAPSULE    Take 1 g by mouth daily.     MULTIPLE VITAMINS-MINERALS (PROTEGRA PO)    Take by mouth daily.     MULTIVITAMIN (THERAGRAN) PER TABLET    Take 1 tablet by mouth daily.     NITROGLYCERIN (NITROSTAT) 0.4 MG SL TABLET    Place 1 tablet (0.4 mg total) under the tongue every 5 (five) minutes as needed. May repeat 3 times   TRAZODONE (DESYREL) 100 MG TABLET    Take 1 tablet (100 mg total) by mouth at bedtime.  UNABLE TO FIND    Allergy shots weekly  Modified Medications   Modified Medication Previous Medication   ALPRAZOLAM (XANAX) 0.5 MG TABLET ALPRAZolam (XANAX) 0.5 MG tablet      Take 1/2 to 1 tablet by mouth three times daily as directed    Take 1/2 to 1 tablet by mouth three times daily as directed  Discontinued Medications   CHLORPHENIRAMINE-HYDROCODONE (TUSSIONEX PENNKINETIC ER) 10-8 MG/5ML LQCR    1 tsp every 12 hours as needed   DIPHENHYD-HYDROCORT-NYSTATIN (FIRST-DUKES MOUTHWASH) SUSP    1 tsp swish and swallow 4 times a day   GUAIFENESIN (MUCINEX) 600 MG 12 HR TABLET    Take 1,200 mg by mouth as needed.

## 2011-07-27 DIAGNOSIS — J309 Allergic rhinitis, unspecified: Secondary | ICD-10-CM | POA: Diagnosis not present

## 2011-08-03 DIAGNOSIS — J309 Allergic rhinitis, unspecified: Secondary | ICD-10-CM | POA: Diagnosis not present

## 2011-08-10 DIAGNOSIS — J309 Allergic rhinitis, unspecified: Secondary | ICD-10-CM | POA: Diagnosis not present

## 2011-08-18 DIAGNOSIS — J309 Allergic rhinitis, unspecified: Secondary | ICD-10-CM | POA: Diagnosis not present

## 2011-08-31 DIAGNOSIS — J309 Allergic rhinitis, unspecified: Secondary | ICD-10-CM | POA: Diagnosis not present

## 2011-09-07 DIAGNOSIS — J309 Allergic rhinitis, unspecified: Secondary | ICD-10-CM | POA: Diagnosis not present

## 2011-09-14 DIAGNOSIS — J309 Allergic rhinitis, unspecified: Secondary | ICD-10-CM | POA: Diagnosis not present

## 2011-09-18 DIAGNOSIS — L2089 Other atopic dermatitis: Secondary | ICD-10-CM | POA: Diagnosis not present

## 2011-09-18 DIAGNOSIS — L719 Rosacea, unspecified: Secondary | ICD-10-CM | POA: Diagnosis not present

## 2011-09-21 DIAGNOSIS — J309 Allergic rhinitis, unspecified: Secondary | ICD-10-CM | POA: Diagnosis not present

## 2011-09-28 DIAGNOSIS — J309 Allergic rhinitis, unspecified: Secondary | ICD-10-CM | POA: Diagnosis not present

## 2011-10-03 DIAGNOSIS — J309 Allergic rhinitis, unspecified: Secondary | ICD-10-CM | POA: Diagnosis not present

## 2011-10-09 ENCOUNTER — Encounter: Payer: Self-pay | Admitting: Cardiology

## 2011-10-09 ENCOUNTER — Ambulatory Visit (INDEPENDENT_AMBULATORY_CARE_PROVIDER_SITE_OTHER): Payer: Medicare Other | Admitting: Cardiology

## 2011-10-09 VITALS — BP 140/82 | HR 75 | Ht 63.0 in | Wt 103.8 lb

## 2011-10-09 DIAGNOSIS — I10 Essential (primary) hypertension: Secondary | ICD-10-CM | POA: Diagnosis not present

## 2011-10-09 DIAGNOSIS — I341 Nonrheumatic mitral (valve) prolapse: Secondary | ICD-10-CM

## 2011-10-09 DIAGNOSIS — I059 Rheumatic mitral valve disease, unspecified: Secondary | ICD-10-CM

## 2011-10-09 DIAGNOSIS — R002 Palpitations: Secondary | ICD-10-CM | POA: Diagnosis not present

## 2011-10-09 NOTE — Assessment & Plan Note (Signed)
Her blood pressure is upper limits of normal here but otherwise fine at home. No change in therapy is indicated.

## 2011-10-09 NOTE — Assessment & Plan Note (Signed)
The patient continues to have these occasionally. However, they're not particularly symptomatic. Therefore, no change in therapy is indicated.

## 2011-10-09 NOTE — Progress Notes (Signed)
HPI The patient presents for followup of hypertension and palpitations. She continues to have anxiety and stress but she said no acute cardiovascular problems recently. She denies any chest pressure, neck or arm discomfort. She is not having any new palpitations and rarely feels these. She's had no presyncope or syncope. She remains active exercising routinely. She has no new shortness of breath, PND or orthopnea. She's had no weight gain or edema.  Allergies  Allergen Reactions  . Codeine     REACTION: NAUSEA  . Erythromycin     REACTION: NAUSEA  . Other     Environmental allergies    Current Outpatient Prescriptions  Medication Sig Dispense Refill  . ALPRAZolam (XANAX) 0.5 MG tablet Take 1/2 to 1 tablet by mouth three times daily as directed  90 tablet  5  . amLODipine (NORVASC) 2.5 MG tablet Take 1 tablet (2.5 mg total) by mouth daily.  30 tablet  11  . aspirin 81 MG tablet Take 81 mg by mouth daily.        . calcium carbonate (CALCIUM 600) 600 MG TABS Take 4 tablets by mouth daily       . cetirizine (ZYRTEC) 10 MG tablet Take 10 mg by mouth daily.        . clidinium-chlordiazePOXIDE (LIBRAX) 2.5-5 MG per capsule Take 1 capsule by mouth 3 (three) times daily as needed. For abd cramping       . conjugated estrogens (PREMARIN) vaginal cream Place vaginally 3 (three) times a week. Apply 3 times weekly  42.5 g  5  . cyclobenzaprine (FLEXERIL) 10 MG tablet Take 10 mg by mouth 3 (three) times daily as needed.        Marland Kitchen EPINEPHrine (EPI-PEN) 0.3 mg/0.3 mL DEVI Inject 0.3 mg into the muscle as needed.      . fish oil-omega-3 fatty acids 1000 MG capsule Take 1 g by mouth daily.        . Multiple Vitamins-Minerals (PROTEGRA PO) Take by mouth daily.        . multivitamin (THERAGRAN) per tablet Take 1 tablet by mouth daily.        . nitroGLYCERIN (NITROSTAT) 0.4 MG SL tablet Place 1 tablet (0.4 mg total) under the tongue every 5 (five) minutes as needed. May repeat 3 times  25 tablet  11  .  traZODone (DESYREL) 100 MG tablet Take 1 tablet (100 mg total) by mouth at bedtime.  30 tablet  11  . UNABLE TO FIND Allergy shots weekly        Past Medical History  Diagnosis Date  . Palpitations   . History of sinusitis   . History of pneumonia   . Diverticulosis of colon   . Hx of colonic polyps   . Lumbar back pain   . Fibromyalgia   . Anxiety   . MVP (mitral valve prolapse)     No antibiotics required for procedures  . Osteoporosis   . Coronary artery spasm     Non obstructive CAD    Past Surgical History  Procedure Date  . Cholecystectomy 03/2003  . Tonsillectomy     as a child  . Lumbar laminectomy   . Cardiac catheterization     ROS:  As stated in the HPI and negative for all other systems.  PHYSICAL EXAM BP 140/82  Pulse 75  Ht 5\' 3"  (1.6 m)  Wt 103 lb 12.8 oz (47.083 kg)  BMI 18.39 kg/m2 GENERAL:  Well appearing NECK:  No  jugular venous distention, waveform within normal limits, carotid upstroke brisk and symmetric, no bruits, no thyromegaly LUNGS:  Clear to auscultation bilaterally CHEST:  Unremarkable HEART:  PMI not displaced or sustained,S1 and S2 within normal limits, no S3, no S4, no clicks, no rubs, no murmurs ABD:  Flat, positive bowel sounds normal in frequency in pitch, no bruits, no rebound, no guarding, no midline pulsatile mass, no hepatomegaly, no splenomegaly EXT:  2 plus pulses throughout, no edema, no cyanosis no clubbing  EKG: Normal sinus rhythm, rate 75, left axis, LAFB, poor anterior R-wave progression, no acute ST-T wave changes.  10/09/2011   ASSESSMENT AND PLAN

## 2011-10-09 NOTE — Assessment & Plan Note (Signed)
This was not clinically relevant and I don't hear any clicks or murmurs. No further imaging is indicated.

## 2011-10-09 NOTE — Patient Instructions (Signed)
Your physician wants you to follow-up in: 1 year. You will receive a reminder letter in the mail two months in advance. If you don't receive a letter, please call our office to schedule the follow-up appointment.  

## 2011-10-12 DIAGNOSIS — J309 Allergic rhinitis, unspecified: Secondary | ICD-10-CM | POA: Diagnosis not present

## 2011-10-19 DIAGNOSIS — J309 Allergic rhinitis, unspecified: Secondary | ICD-10-CM | POA: Diagnosis not present

## 2011-10-24 DIAGNOSIS — H1045 Other chronic allergic conjunctivitis: Secondary | ICD-10-CM | POA: Diagnosis not present

## 2011-10-24 DIAGNOSIS — J3089 Other allergic rhinitis: Secondary | ICD-10-CM | POA: Diagnosis not present

## 2011-10-24 DIAGNOSIS — J301 Allergic rhinitis due to pollen: Secondary | ICD-10-CM | POA: Diagnosis not present

## 2011-10-24 DIAGNOSIS — J3081 Allergic rhinitis due to animal (cat) (dog) hair and dander: Secondary | ICD-10-CM | POA: Diagnosis not present

## 2011-10-26 DIAGNOSIS — J309 Allergic rhinitis, unspecified: Secondary | ICD-10-CM | POA: Diagnosis not present

## 2011-10-27 DIAGNOSIS — J309 Allergic rhinitis, unspecified: Secondary | ICD-10-CM | POA: Diagnosis not present

## 2011-11-01 DIAGNOSIS — J309 Allergic rhinitis, unspecified: Secondary | ICD-10-CM | POA: Diagnosis not present

## 2011-11-09 DIAGNOSIS — J309 Allergic rhinitis, unspecified: Secondary | ICD-10-CM | POA: Diagnosis not present

## 2011-11-23 DIAGNOSIS — J309 Allergic rhinitis, unspecified: Secondary | ICD-10-CM | POA: Diagnosis not present

## 2011-11-30 DIAGNOSIS — J309 Allergic rhinitis, unspecified: Secondary | ICD-10-CM | POA: Diagnosis not present

## 2011-12-07 DIAGNOSIS — J309 Allergic rhinitis, unspecified: Secondary | ICD-10-CM | POA: Diagnosis not present

## 2011-12-13 DIAGNOSIS — J309 Allergic rhinitis, unspecified: Secondary | ICD-10-CM | POA: Diagnosis not present

## 2011-12-21 DIAGNOSIS — J309 Allergic rhinitis, unspecified: Secondary | ICD-10-CM | POA: Diagnosis not present

## 2011-12-25 DIAGNOSIS — M279 Disease of jaws, unspecified: Secondary | ICD-10-CM | POA: Diagnosis not present

## 2011-12-25 DIAGNOSIS — H9209 Otalgia, unspecified ear: Secondary | ICD-10-CM | POA: Diagnosis not present

## 2011-12-28 DIAGNOSIS — J309 Allergic rhinitis, unspecified: Secondary | ICD-10-CM | POA: Diagnosis not present

## 2011-12-29 DIAGNOSIS — J309 Allergic rhinitis, unspecified: Secondary | ICD-10-CM | POA: Diagnosis not present

## 2012-01-04 DIAGNOSIS — J309 Allergic rhinitis, unspecified: Secondary | ICD-10-CM | POA: Diagnosis not present

## 2012-01-10 DIAGNOSIS — Z23 Encounter for immunization: Secondary | ICD-10-CM | POA: Diagnosis not present

## 2012-01-11 DIAGNOSIS — J309 Allergic rhinitis, unspecified: Secondary | ICD-10-CM | POA: Diagnosis not present

## 2012-01-18 ENCOUNTER — Other Ambulatory Visit: Payer: Self-pay | Admitting: Pulmonary Disease

## 2012-01-18 DIAGNOSIS — J309 Allergic rhinitis, unspecified: Secondary | ICD-10-CM | POA: Diagnosis not present

## 2012-01-23 ENCOUNTER — Ambulatory Visit (INDEPENDENT_AMBULATORY_CARE_PROVIDER_SITE_OTHER): Payer: Medicare Other | Admitting: Pulmonary Disease

## 2012-01-23 ENCOUNTER — Encounter: Payer: Self-pay | Admitting: Pulmonary Disease

## 2012-01-23 VITALS — BP 124/62 | HR 81 | Temp 97.3°F | Ht 63.0 in | Wt 105.4 lb

## 2012-01-23 DIAGNOSIS — R002 Palpitations: Secondary | ICD-10-CM

## 2012-01-23 DIAGNOSIS — F411 Generalized anxiety disorder: Secondary | ICD-10-CM

## 2012-01-23 DIAGNOSIS — K589 Irritable bowel syndrome without diarrhea: Secondary | ICD-10-CM

## 2012-01-23 DIAGNOSIS — I10 Essential (primary) hypertension: Secondary | ICD-10-CM

## 2012-01-23 DIAGNOSIS — I2510 Atherosclerotic heart disease of native coronary artery without angina pectoris: Secondary | ICD-10-CM | POA: Diagnosis not present

## 2012-01-23 DIAGNOSIS — K5730 Diverticulosis of large intestine without perforation or abscess without bleeding: Secondary | ICD-10-CM | POA: Diagnosis not present

## 2012-01-23 DIAGNOSIS — M545 Low back pain: Secondary | ICD-10-CM

## 2012-01-23 DIAGNOSIS — M810 Age-related osteoporosis without current pathological fracture: Secondary | ICD-10-CM

## 2012-01-23 DIAGNOSIS — IMO0001 Reserved for inherently not codable concepts without codable children: Secondary | ICD-10-CM

## 2012-01-23 DIAGNOSIS — D126 Benign neoplasm of colon, unspecified: Secondary | ICD-10-CM

## 2012-01-23 DIAGNOSIS — M26629 Arthralgia of temporomandibular joint, unspecified side: Secondary | ICD-10-CM | POA: Insufficient documentation

## 2012-01-23 MED ORDER — TRAZODONE HCL 100 MG PO TABS: 100.0000 mg | ORAL_TABLET | Freq: Every day | ORAL | Status: DC

## 2012-01-23 NOTE — Progress Notes (Signed)
Subjective:    Patient ID: [name] [name], female    DOB: 1935/07/09, 76 y.o.   MRN: [id number]  HPI 76 y/o WF here for a 6 month follow up visit... she has multiple medical problems including HBP;  CAD/ hx palpit;  Divertics/ IBS/ Polyps/ Angiodysplasia;  LBP & FM;  Osteoporosis;  Anxiety...  ~  July 19, 2010:  6 month ROV & doing well overall;  She notes recent hx refractory sinusitis w/ several visits w/ DrRosen & 2 CT's etc; treated w/ antibiotics for >671month & finally better (just in time for allergy season);  Also c/o mild rash> saw DrFHouston w/ cream rx & improved...    BP controlled on low dose Norvasc;  Denies CP, palpit, ch in SOB, dizziness, syncope, edema, etc;      GI stable, no bleeding seen, & last colon 2/03 w/ f/u due 2/13 by DrDBrodie;      Hx LBP, FM, & Osteoporosis> last BMD 2/10 showed TScores -1.3 in Spine, & -1.8 in L FemNeck (due for f/u & we will sched)...    Anxiety>  Still under stress from daugh divorce & grandchild & we will refill her Xanax...    LABS DONE 06/29/10> FLP, Chems, CBC, TSH > all look good & reviewed w/ pt...  ~  January 23, 2011:  71mo ROV & notes sl dizzy, lightheaded, no assoc N/V etc;  Notes dry skin & eval by Vaughan Sine fells that her skin has changed, "I have atopic dermatitis" "I was born w/ eczema";  She had allergy eval by DrESL 7/12> Rx Zyrtek, nasal saline, & allergy vaccine;  We reviewed herprev labs    HBP> on Amlodipine2.5mg  & BP= 136/82; denies HA, CP, palpit, SOB, edema, etc...    CAD & Palpit> on ASA & FishOil; notes some incr palpit w/ stress but [name] [name] helps; knows to avoid caffeine etc; she saw DrHochrein 6/12> doing satis w/o new symptoms, no changes made...    Divertics & Colon Polyps> on Librax; followed by DrDBrodie w/ hx IBS and f/u colon due in 2013...    Hx angiodysplasia> as noted, she denies any recen bleeding and Hg has been stable...    Hx LBP/ FM> on Flexeril prn and she copes very well...    Osteopenia> on Calcium, MVI, &  Fosamax drug holiday since 3/11 per DrGottsegen & repeat BMD 3/12 showed TScore -1.8 in Spine (see below)...    Anxiety> on Xanax Prn & Desyrel 100mg /d; she is under a lot of stress still...  ~  July 24, 2011:  71mo ROV & she escribes a bad URI which was hard to shake despite augmentin/ Mucinex/ MMW/ Tussionex/ Align- all called in for her prev; finally better 7 back to baseline;  She had FASTING labs done prior to OV, & requests refill Alprazolam...    HBP> on Amlodipine2.5mg  & BP= 130/72; denies HA, CP, palpit, SOB, edema, etc...    CAD & Palpit> on ASA & FishOil; notes some incr palpit w/ stress but [name] [name] helps; knows to avoid caffeine etc; she saw DrHochrein 6/12> EKG showed NSR, rate84, septal infarct (poor R prog V1-2, otherw neg, NAD; doing satis w/o new symptoms, no changes made...    Divertics & Colon Polyps> on Librax; followed by DrDBrodie w/ hx IBS and f/u colon due in 2013...    Hx angiodysplasia> as noted, she denies any recent bleeding and Hg has been stable...    Hx LBP/ FM> on Flexeril prn and she copes very well.Marland KitchenMarland Kitchen  Osteopenia> on Calcium, MVI, & Fosamax drug holiday since 3/11 per DrGottsegen & repeat BMD 3/12 showed TScore -1.8 in Spine (see below); we decided to leave her off the Fosamax until f/u BMD 3/14...    Anxiety> on Xanax Prn & Desyrel 100mg /d; she is under a lot of stress still w/ husb illness & daugh divorce...  ~  January 23, 2012:  18mo ROV & [name] [name] was Dx w/ TMJ several weeks ago & has new mouth guard- we discussed assoc w/ anxiety and she has Xanax & Desyrel...     She saw DrVanWinkle for an allergy f/u 6/13 & she was doing satis on allergy shots, Zyrtek & saline, no changes made...    She saw DrHochrein 6/13 for Cards f/u doing satis & no changes made to her med regimen...    We reviewed prob list, meds, xrays and labs> see below >> she already had her 2013 Flu vaccine...          Problem List:  Hx of SINUSITIS (ICD-473.9) - uses ZYRTEK OTC, Saline nasal  wash, & FLONASE as needed... she notes that her allergies are bothering her and she saw DrESL for allergy re-eval 7/12> still on shots... ~  6/13:  Now followed by DrVan Winkle> allergic rhinitis on shots since 1983, plus Zyrtek Prn & Saline Prn...  Hx of PNEUMONIA (ICD-486) >> resolved ~  CXR 3/12 showed clear lungs, NAD...  HYPERTENSION (ICD-401.9) - controlled on NORVASC 2.5mg /d... ~  9/12:  BP=136/82 & denies HA, fatigue, visual changes, CP, palipit, syncope, dyspnea, edema, etc... ~  3/13:  BP= 130/72 & she remains largely asymptomatic... ~  9/13:  BP= 124/62 & she denies visual symptoms, HA, CP, palpit, dizziness, SOB, edema, etc...  CORONARY ARTERY DISEASE (ICD-414.00) & Hx of PALPITATIONS (ICD-785.1) - on ASA 81mg /d, and FISH OIL daily... hx coronary spasm w/ non-obstructive CAD on cath 1999 (20-30% diagonal branch LAD only)... sl anterobasal HK & EF=59%... she reports some incr palpit w/ stress- Alpraz 0.5mg  helps... ~  Baseline EKG showed NSR, septal infarct w/ poor R prog V1-2, otherw wnl/ NAD==> no change on 6/13 tracing. ~  She is followed by DrHochrein & seen yearly> OV 6/13 reviewed==> stable, no new symptoms, no changes made...  DIVERTICULOSIS OF COLON (ICD-562.10), IBS (ICD-564.1), & COLONIC POLYPS (ICD-211.3) >> ~  colonoscopy 12/03 by DrDBrodie showing divertics only... f/u planned 60yrs... ~  eval 3/09 for hematochezia w/ Hg 13.5, Fe 103, repeat colon w/ 3mm polyp= hyperplastic + angiodysplasia... f/u planned 28yrs... ~  2/10: notes some increased abd cramping/ IBS- we will refill her LIBRAX for Prn use...  Hx of COLONIC ANGIODYSPLASIA (ICD-569.84) - as above; no recent bleeding noted & Hg stable...  BACK PAIN, LUMBAR (ICD-724.2) - S/P LLam L4-5 yrs ago... ortho eval 9/06 by DrApplington w/ MRI showing DDD... conservative rx...  FIBROMYALGIA (ICD-729.1) - resting OK w/ TRAZADONE 100mg Qhs & FLEXERIL 10mg  Tid Prn...  OSTEOPOROSIS (ICD-733.00) - prev on FOSAMAX w/ D (she  insisted on this Rx), +Calcium, +MVI...  ~  BMD 1/08 was improved w/ TScores -1.5 to -2.1 on FOSAMAX therapy...  ~  7/09:  she wants to discuss stopping Fosamax in favor of Vit K which she read in "[employer]: Personal" newsletter- mult questions answered... we will check Vit D level (normal= 63) and I rec that she continue the Fosamax, calcium supplements, multivit therapy... ~  2/10:  f/u BMD showed TScores -1.3 in Spine, & -1.8 in left FemNeck = improved... ~  3/11:  DrGottsegen placed her on a Bisphos drug holiday... ~  3/12:  f/u BMD showed TScores -1.8 in Spine, & -1.5 in the left [facility] ~  3/13:  We reviewed prev results & decided to continue Calcium, MVI, Vit D, & hold Bisphos therapy until f/u BMD due 3/14...  ANXIETY (ICD-300.00) - uses ALPRAZOLAM 0.5mg  Tid Prn...  HEALTH MAINTENANCE:  she notes brother had shingles and we discussed the shingles vaccine- I rec that she get this shot at the health dept (NOTE: she reported mild case of Shingles on her left arm treated by [facility] in 2011)... she had PNEUMOVAX in 2007... gets yearly FLU SHOTs... GYN = DrGottsegen and he has her on Premarin Rx + vag cream...   Past Surgical History  Procedure Date  . Cholecystectomy 03/2003  . Tonsillectomy     as a child  . Lumbar laminectomy     Outpatient Encounter Prescriptions as of 01/23/2012  Medication Sig Dispense Refill  . ALPRAZolam (XANAX) 0.5 MG tablet Take 1/2 to 1 tablet by mouth three times daily as directed  90 tablet  5  . amLODipine (NORVASC) 2.5 MG tablet TAKE ONE TABLET BY MOUTH ONE TIME DAILY  30 tablet  6  . aspirin 81 MG tablet Take 81 mg by mouth daily.        . calcium carbonate (CALCIUM 600) 600 MG TABS Take 4 tablets by mouth daily       . cetirizine (ZYRTEC) 10 MG tablet Take 10 mg by mouth daily.        . clidinium-chlordiazePOXIDE (LIBRAX) 2.5-5 MG per capsule Take 1 capsule by mouth 3 (three) times daily as needed. For abd cramping       . conjugated estrogens  (PREMARIN) vaginal cream Place vaginally 3 (three) times a week. Apply 3 times weekly  42.5 g  5  . cyclobenzaprine (FLEXERIL) 10 MG tablet Take 10 mg by mouth 3 (three) times daily as needed.        Marland Kitchen EPINEPHrine (EPI-PEN) 0.3 mg/0.3 mL DEVI Inject 0.3 mg into the muscle as needed.      . fish oil-omega-3 fatty acids 1000 MG capsule Take 1 g by mouth daily.        . Multiple Vitamins-Minerals (PROTEGRA PO) Take by mouth daily.        . multivitamin (THERAGRAN) per tablet Take 1 tablet by mouth daily.        . nitroGLYCERIN (NITROSTAT) 0.4 MG SL tablet Place 1 tablet (0.4 mg total) under the tongue every 5 (five) minutes as needed. May repeat 3 times  25 tablet  11  . traZODone (DESYREL) 100 MG tablet Take 1 tablet (100 mg total) by mouth at bedtime.  30 tablet  11  . UNABLE TO FIND Allergy shots weekly        Allergies  Allergen Reactions  . Codeine     REACTION: NAUSEA  . Erythromycin     REACTION: NAUSEA  . Other     Environmental allergies    Current Medications, Allergies, Past Medical History, Past Surgical History, Family History, and Social History were reviewed in [employer] record.    Review of Systems  Constitutional:  Denies F/C/S, anorexia, unexpected weight change. HEENT:  No HA, visual changes, earache, nasal symptoms, sore throat, hoarseness. Resp:  No cough, sputum, hemoptysis; no SOB, tightness, wheezing. Cardio:  No CP, DOE, orthopnea, edema. Notes occas palpit... GI:  Denies N/V/D/C or blood in stool; no reflux, abd pain, distention,  or gas. GU:  No dysuria, freq, urgency, hematuria, or flank pain. MS:  Denies joint pain, swelling, tenderness, or decr ROM; no neck pain, back pain, etc. Neuro:  No tremors, seizures, dizziness, syncope, weakness, numbness, gait abn. Skin:  No suspicious lesions; she notes skin rash eval DrHouston. Heme:  No adenopathy, bruising, bleeding. Psyche: Denies confusion, sleep disturbance, hallucinations,  anxiety, depression.     Objective:   Physical Exam   WD, WN, 76 y/o WF in NAD... Vital Signs:  Reviewed...  General:  Alert & oriented; pleasant & cooperative... HEENT:  [place]/AT, EOM-wnl, PERRLA, Fundi-benign, EACs-clear, TMs-wnl, NOSE-clear, THROAT-clear & wnl. Neck:  Supple w/ fair ROM; no JVD; normal carotid impulses w/o bruits; no thyromegaly or nodules palpated; no lymphadenopathy. Chest:  Clear to P & A; without wheezes/ rales/ or rhonchi heard... Heart:  Regular Rhythm; norm S1 & S2 without murmurs/ rubs/ or gallops detected... Abdomen:  Soft & nontender; normal bowel sounds; no organomegaly or masses palpated... Ext:  Normal ROM; without deformities or arthritic changes; no varicose veins, venous insuffic, or edema;  Pulses intact w/o bruits... Neuro:  CNs II-XII intact; motor testing normal; sensory testing normal; gait normal & balance OK... Derm:  No lesions noted; no rash etc... Lymph:  No cervical, supraclavicular, axillary, or inguinal adenopathy palpated...  RADIOLOGY DATA:  Reviewed in the EPIC EMR & discussed w/ the patient...  LABORATORY DATA:  Reviewed in the EPIC EMR & discussed w/ the patient...   Assessment & Plan:    HBP> on Amlodipine2.5mg  & BP well controlled & stable; continue same...     CAD & Palpit> on ASA & FishOil; followed by DrHochrein & seen 6/12> doing satis w/o new symptoms, no changes made...     Divertics & Colon Polyps> on Librax; followed by DrDBrodie w/ hx IBS and f/u colon due in 2013...     Hx Angiodysplasia> she denies any recent bleeding and Hg has been stable...     Hx LBP/ FM> on Flexeril prn and she copes very well...     Osteopenia> on Calcium, MVI, & Fosamax drug holiday since 3/11 per DrGottsegen & repeat BMD 3/12 showed TScore -1.8 in Spine (see above)...     Anxiety> on Xanax Prn & Desyrel 100mg /d; she is under a lot of stress still...   Patient's Medications  New Prescriptions   No medications on file  Previous  Medications   ALPRAZOLAM (XANAX) 0.5 MG TABLET    Take 1/2 to 1 tablet by mouth three times daily as directed   AMLODIPINE (NORVASC) 2.5 MG TABLET    TAKE ONE TABLET BY MOUTH ONE TIME DAILY   ASPIRIN 81 MG TABLET    Take 81 mg by mouth daily.     CALCIUM CARBONATE (CALCIUM 600) 600 MG TABS    Take 4 tablets by mouth daily    CETIRIZINE (ZYRTEC) 10 MG TABLET    Take 10 mg by mouth daily.     CLIDINIUM-CHLORDIAZEPOXIDE (LIBRAX) 2.5-5 MG PER CAPSULE    Take 1 capsule by mouth 3 (three) times daily as needed. For abd cramping    CONJUGATED ESTROGENS (PREMARIN) VAGINAL CREAM    Place vaginally 3 (three) times a week. Apply 3 times weekly   CYCLOBENZAPRINE (FLEXERIL) 10 MG TABLET    Take 10 mg by mouth 3 (three) times daily as needed.     EPINEPHRINE (EPI-PEN) 0.3 MG/0.3 ML DEVI    Inject 0.3 mg into the muscle as needed.   FISH OIL-OMEGA-3 FATTY ACIDS  1000 MG CAPSULE    Take 1 g by mouth daily.     MULTIPLE VITAMINS-MINERALS (PROTEGRA PO)    Take by mouth daily.     MULTIVITAMIN (THERAGRAN) PER TABLET    Take 1 tablet by mouth daily.     NITROGLYCERIN (NITROSTAT) 0.4 MG SL TABLET    Place 1 tablet (0.4 mg total) under the tongue every 5 (five) minutes as needed. May repeat 3 times   TRAZODONE (DESYREL) 100 MG TABLET    Take 1 tablet (100 mg total) by mouth at bedtime.   UNABLE TO FIND    Allergy shots weekly  Modified Medications   No medications on file  Discontinued Medications   No medications on file

## 2012-01-23 NOTE — Patient Instructions (Addendum)
Today we updated your med list in our EPIC system...    Continue your current medications the same...    We refilled your Trazodone as requested...  Call for any problems...  Let's plan a follow up visit in 6 months w/ FASTING blood work at that time.Marland KitchenMarland Kitchen

## 2012-01-24 DIAGNOSIS — J309 Allergic rhinitis, unspecified: Secondary | ICD-10-CM | POA: Diagnosis not present

## 2012-02-02 DIAGNOSIS — J309 Allergic rhinitis, unspecified: Secondary | ICD-10-CM | POA: Diagnosis not present

## 2012-02-08 DIAGNOSIS — J309 Allergic rhinitis, unspecified: Secondary | ICD-10-CM | POA: Diagnosis not present

## 2012-02-16 DIAGNOSIS — J309 Allergic rhinitis, unspecified: Secondary | ICD-10-CM | POA: Diagnosis not present

## 2012-02-22 DIAGNOSIS — J309 Allergic rhinitis, unspecified: Secondary | ICD-10-CM | POA: Diagnosis not present

## 2012-02-29 DIAGNOSIS — J309 Allergic rhinitis, unspecified: Secondary | ICD-10-CM | POA: Diagnosis not present

## 2012-03-07 DIAGNOSIS — J309 Allergic rhinitis, unspecified: Secondary | ICD-10-CM | POA: Diagnosis not present

## 2012-03-14 DIAGNOSIS — J309 Allergic rhinitis, unspecified: Secondary | ICD-10-CM | POA: Diagnosis not present

## 2012-03-21 DIAGNOSIS — J309 Allergic rhinitis, unspecified: Secondary | ICD-10-CM | POA: Diagnosis not present

## 2012-03-25 DIAGNOSIS — L821 Other seborrheic keratosis: Secondary | ICD-10-CM | POA: Diagnosis not present

## 2012-03-25 DIAGNOSIS — D239 Other benign neoplasm of skin, unspecified: Secondary | ICD-10-CM | POA: Diagnosis not present

## 2012-03-25 DIAGNOSIS — D692 Other nonthrombocytopenic purpura: Secondary | ICD-10-CM | POA: Diagnosis not present

## 2012-03-25 DIAGNOSIS — L819 Disorder of pigmentation, unspecified: Secondary | ICD-10-CM | POA: Diagnosis not present

## 2012-03-25 DIAGNOSIS — L219 Seborrheic dermatitis, unspecified: Secondary | ICD-10-CM | POA: Diagnosis not present

## 2012-03-27 DIAGNOSIS — J309 Allergic rhinitis, unspecified: Secondary | ICD-10-CM | POA: Diagnosis not present

## 2012-04-05 DIAGNOSIS — J309 Allergic rhinitis, unspecified: Secondary | ICD-10-CM | POA: Diagnosis not present

## 2012-04-10 DIAGNOSIS — J309 Allergic rhinitis, unspecified: Secondary | ICD-10-CM | POA: Diagnosis not present

## 2012-04-18 DIAGNOSIS — J309 Allergic rhinitis, unspecified: Secondary | ICD-10-CM | POA: Diagnosis not present

## 2012-04-26 DIAGNOSIS — J309 Allergic rhinitis, unspecified: Secondary | ICD-10-CM | POA: Diagnosis not present

## 2012-04-30 DIAGNOSIS — Z1231 Encounter for screening mammogram for malignant neoplasm of breast: Secondary | ICD-10-CM | POA: Diagnosis not present

## 2012-05-02 DIAGNOSIS — J309 Allergic rhinitis, unspecified: Secondary | ICD-10-CM | POA: Diagnosis not present

## 2012-05-03 ENCOUNTER — Encounter: Payer: Self-pay | Admitting: Gynecology

## 2012-05-09 DIAGNOSIS — J309 Allergic rhinitis, unspecified: Secondary | ICD-10-CM | POA: Diagnosis not present

## 2012-05-16 DIAGNOSIS — J309 Allergic rhinitis, unspecified: Secondary | ICD-10-CM | POA: Diagnosis not present

## 2012-05-21 ENCOUNTER — Other Ambulatory Visit: Payer: Self-pay | Admitting: Pulmonary Disease

## 2012-05-21 MED ORDER — ALPRAZOLAM 0.5 MG PO TABS: ORAL_TABLET | ORAL | Status: DC

## 2012-05-21 NOTE — Telephone Encounter (Signed)
Refill called to the pharmacy 

## 2012-05-23 DIAGNOSIS — J309 Allergic rhinitis, unspecified: Secondary | ICD-10-CM | POA: Diagnosis not present

## 2012-05-30 DIAGNOSIS — J309 Allergic rhinitis, unspecified: Secondary | ICD-10-CM | POA: Diagnosis not present

## 2012-06-03 DIAGNOSIS — J309 Allergic rhinitis, unspecified: Secondary | ICD-10-CM | POA: Diagnosis not present

## 2012-06-06 DIAGNOSIS — J309 Allergic rhinitis, unspecified: Secondary | ICD-10-CM | POA: Diagnosis not present

## 2012-06-12 DIAGNOSIS — J309 Allergic rhinitis, unspecified: Secondary | ICD-10-CM | POA: Diagnosis not present

## 2012-07-08 ENCOUNTER — Encounter: Payer: Medicare Other | Admitting: Gynecology

## 2012-07-10 DIAGNOSIS — J309 Allergic rhinitis, unspecified: Secondary | ICD-10-CM | POA: Diagnosis not present

## 2012-07-15 ENCOUNTER — Telehealth: Payer: Self-pay | Admitting: Pulmonary Disease

## 2012-07-15 NOTE — Telephone Encounter (Signed)
Leigh, please advise on labs thanks! 

## 2012-07-17 NOTE — Telephone Encounter (Signed)
Pt called back and she is aware that labs are in the computer for her. Nothing further is needed.

## 2012-07-18 DIAGNOSIS — J309 Allergic rhinitis, unspecified: Secondary | ICD-10-CM | POA: Diagnosis not present

## 2012-07-19 ENCOUNTER — Other Ambulatory Visit (INDEPENDENT_AMBULATORY_CARE_PROVIDER_SITE_OTHER): Payer: Medicare Other

## 2012-07-19 DIAGNOSIS — F411 Generalized anxiety disorder: Secondary | ICD-10-CM

## 2012-07-19 DIAGNOSIS — E7800 Pure hypercholesterolemia, unspecified: Secondary | ICD-10-CM | POA: Diagnosis not present

## 2012-07-19 DIAGNOSIS — I10 Essential (primary) hypertension: Secondary | ICD-10-CM | POA: Diagnosis not present

## 2012-07-19 DIAGNOSIS — D126 Benign neoplasm of colon, unspecified: Secondary | ICD-10-CM

## 2012-07-19 LAB — LIPID PANEL
Cholesterol: 172 mg/dL (ref 0–200)
VLDL: 34 mg/dL (ref 0.0–40.0)

## 2012-07-19 LAB — URINALYSIS, ROUTINE W REFLEX MICROSCOPIC
Bilirubin Urine: NEGATIVE
Ketones, ur: NEGATIVE
Nitrite: NEGATIVE
Specific Gravity, Urine: 1.01 (ref 1.000–1.030)
Urobilinogen, UA: 0.2 (ref 0.0–1.0)
pH: 7 (ref 5.0–8.0)

## 2012-07-19 LAB — BASIC METABOLIC PANEL
BUN: 12 mg/dL (ref 6–23)
CO2: 29 mEq/L (ref 19–32)
Chloride: 101 mEq/L (ref 96–112)
Glucose, Bld: 86 mg/dL (ref 70–99)
Potassium: 4.3 mEq/L (ref 3.5–5.1)
Sodium: 138 mEq/L (ref 135–145)

## 2012-07-19 LAB — CBC WITH DIFFERENTIAL/PLATELET
Basophils Absolute: 0.1 10*3/uL (ref 0.0–0.1)
Eosinophils Relative: 2.1 % (ref 0.0–5.0)
Lymphs Abs: 1.6 10*3/uL (ref 0.7–4.0)
Monocytes Absolute: 0.5 10*3/uL (ref 0.1–1.0)
Monocytes Relative: 9.5 % (ref 3.0–12.0)
Neutrophils Relative %: 57.5 % (ref 43.0–77.0)
Platelets: 250 10*3/uL (ref 150.0–400.0)
RDW: 12.4 % (ref 11.5–14.6)
WBC: 5.2 10*3/uL (ref 4.5–10.5)

## 2012-07-19 LAB — TSH: TSH: 1.74 u[IU]/mL (ref 0.35–5.50)

## 2012-07-19 LAB — HEPATIC FUNCTION PANEL
ALT: 18 U/L (ref 0–35)
AST: 22 U/L (ref 0–37)
Albumin: 3.9 g/dL (ref 3.5–5.2)
Alkaline Phosphatase: 54 U/L (ref 39–117)
Total Protein: 6.8 g/dL (ref 6.0–8.3)

## 2012-07-22 ENCOUNTER — Encounter: Payer: Self-pay | Admitting: Gynecology

## 2012-07-22 ENCOUNTER — Ambulatory Visit (INDEPENDENT_AMBULATORY_CARE_PROVIDER_SITE_OTHER): Payer: Medicare Other | Admitting: Gynecology

## 2012-07-22 VITALS — BP 132/78 | Ht 64.0 in | Wt 106.0 lb

## 2012-07-22 DIAGNOSIS — Z79890 Hormone replacement therapy (postmenopausal): Secondary | ICD-10-CM | POA: Diagnosis not present

## 2012-07-22 DIAGNOSIS — Z780 Asymptomatic menopausal state: Secondary | ICD-10-CM

## 2012-07-22 DIAGNOSIS — N952 Postmenopausal atrophic vaginitis: Secondary | ICD-10-CM

## 2012-07-22 DIAGNOSIS — M949 Disorder of cartilage, unspecified: Secondary | ICD-10-CM

## 2012-07-22 DIAGNOSIS — M899 Disorder of bone, unspecified: Secondary | ICD-10-CM

## 2012-07-22 DIAGNOSIS — M8580 Other specified disorders of bone density and structure, unspecified site: Secondary | ICD-10-CM

## 2012-07-22 MED ORDER — NONFORMULARY OR COMPOUNDED ITEM: Status: DC

## 2012-07-22 NOTE — Progress Notes (Signed)
[name] [name] 1935/06/14 [id number]   History:    77 y.o.  Who presented to the office today with complaining of vaginal dryness and irritation as well as for gynecological exam. Patient had been on Premarin vaginal cream but it was too costly for her. Review of patient's records indicated that in the past and she had had history of osteoporosis and had been on Fosamax for 5 years and discontinued in 2012 is currently on a drug holiday. Her last bone density study was in 2012 with her lowest T score at the [facility] with a value of -1.8. She is taking her calcium and vitamin D on a regular basis and is pretty active.Her primary physician is Dr.Nadell who she is scheduled to see me next week and we'll be giving her blood work as well as scheduling her overdue colonoscopy in her bone density. Patient denies any prior history of abnormal Pap smear. Her Tdap vaccine is up-to-date. She has not received her shingles vaccine.   Past medical history,surgical history, family history and social history were all reviewed and documented in the EPIC chart.  Gynecologic History No LMP recorded. Patient is postmenopausal. Contraception: post menopausal status Last Pap: 2012. Results were: normal Last mammogram: 2013. Results were: normal  Obstetric History OB History   Grav Para Term Preterm Abortions TAB SAB Ect Mult Living   1 1 1   0     1     # Outc Date GA Lbr Len/2nd Wgt Sex Del Anes PTL Lv   1 TRM                ROS: A ROS was performed and pertinent positives and negatives are included in the history.  GENERAL: No fevers or chills. HEENT: No change in vision, no earache, sore throat or sinus congestion. NECK: No pain or stiffness. CARDIOVASCULAR: No chest pain or pressure. No palpitations. PULMONARY: No shortness of breath, cough or wheeze. GASTROINTESTINAL: No abdominal pain, nausea, vomiting or diarrhea, melena or bright red blood per rectum. GENITOURINARY: No urinary frequency, urgency,  hesitancy or dysuria. MUSCULOSKELETAL: No joint or muscle pain, no back pain, no recent trauma. DERMATOLOGIC: vaginal dryness ENDOCRINE: No polyuria, polydipsia, no heat or cold intolerance. No recent change in weight. HEMATOLOGICAL: No anemia or easy bruising or bleeding. NEUROLOGIC: No headache, seizures, numbness, tingling or weakness. PSYCHIATRIC: No depression, no loss of interest in normal activity or change in sleep pattern.     Exam: chaperone present  BP 132/78  Ht 5\' 4"  (1.626 m)  Wt 106 lb (48.081 kg)  BMI 18.19 kg/m2  Body mass index is 18.19 kg/(m^2).  General appearance : Well developed well nourished female. No acute distress HEENT: Neck supple, trachea midline, no carotid bruits, no thyroidmegaly Lungs: Clear to auscultation, no rhonchi or wheezes, or rib retractions  Heart: Regular rate and rhythm, no murmurs or gallops Breast:Examined in sitting and supine position were symmetrical in appearance, no palpable masses or tenderness,  no skin retraction, no nipple inversion, no nipple discharge, no skin discoloration, no axillary or supraclavicular lymphadenopathy Abdomen: no palpable masses or tenderness, no rebound or guarding Extremities: no edema or skin discoloration or tenderness  Pelvic:  Bartholin, Urethra, Skene Glands: Within normal limits             Vagina: No gross lesions or discharge, atrophic changes  Cervix: No gross lesions or discharge  Uterus  axial, normal size, shape and consistency, non-tender and mobile  Adnexa  Without masses or  tenderness  Anus and perineum  normal   Rectovaginal  normal sphincter tone without palpated masses or tenderness             Hemoccult her primary physician will be giving it to her this week     Assessment/Plan:  77 y.o. female with vaginal atrophy on topical estrogen. To save her money on the vaginal estrogen cream I'm going to prescribe the following from the compounding pharmacy: estradiol 0.02% 1 mL prefilled  applicator to apply vaginally twice a week. We discussed importance of calcium and vitamin D and regular exercise for osteoporosis prevention. She's currently on her drug holiday we'll wait to see what this coming bone density study will show. We will check her vitamin D level today. No Pap smear done today the new screening guidelines discussed. I did give her prescription to obtain the shingles vaccine. Literature information was provided. She was reminded do her monthly self breast examination and to obtain her mammogram at the end of this year.   Ok Edwards MD, 11:35 AM 07/22/2012

## 2012-07-22 NOTE — Patient Instructions (Addendum)
Shingles Vaccine What You Need to Know WHAT IS SHINGLES?  Shingles is a painful skin rash, often with blisters. It is also called Herpes Zoster or just Zoster.  A shingles rash usually appears on one side of the face or body and lasts from 2 to 4 weeks. Its main symptom is pain, which can be quite severe. Other symptoms of shingles can include fever, headache, chills, and upset stomach. Very rarely, a shingles infection can lead to pneumonia, hearing problems, blindness, brain inflammation (encephalitis), or death.  For about 1 person in 5, severe pain can continue even after the rash clears up. This is called post-herpetic neuralgia.  Shingles is caused by the Varicella Zoster virus. This is the same virus that causes chickenpox. Only someone who has had a case of chickenpox or rarely, has gotten chickenpox vaccine, can get shingles. The virus stays in your body. It can reappear many years later to cause a case of shingles.  You cannot catch shingles from another person with shingles. However, a person who has never had chickenpox (or chickenpox vaccine) could get chickenpox from someone with shingles. This is not very common.  Shingles is far more common in people 50 and older than in younger people. It is also more common in people whose immune systems are weakened because of a disease such as cancer or drugs such as steroids or chemotherapy.  At least 1 million people get shingles per year in the [place]. SHINGLES VACCINE  A vaccine for shingles was licensed in 2006. In clinical trials, the vaccine reduced the risk of shingles by 50%. It can also reduce the pain in people who still get shingles after being vaccinated.  A single dose of shingles vaccine is recommended for adults 60 years of age and older. SOME PEOPLE SHOULD NOT GET SHINGLES VACCINE OR SHOULD WAIT A person should not get shingles vaccine if he or she:  Has ever had a life-threatening allergic reaction to gelatin, the  antibiotic neomycin, or any other component of shingles vaccine. Tell your caregiver if you have any severe allergies.  Has a weakened immune system because of current:  AIDS or another disease that affects the immune system.  Treatment with drugs that affect the immune system, such as prolonged use of high-dose steroids.  Cancer treatment, such as radiation or chemotherapy.  Cancer affecting the bone marrow or lymphatic system, such as leukemia or lymphoma.  Is pregnant, or might be pregnant. Women should not become pregnant until at least 4 weeks after getting shingles vaccine. Someone with a minor illness, such as a cold, may be vaccinated. Anyone with a moderate or severe acute illness should usually wait until he or she recovers before getting the vaccine. This includes anyone with a temperature of 101.3 F (38 C) or higher. WHAT ARE THE RISKS FROM SHINGLES VACCINE?  A vaccine, like any medicine, could possibly cause serious problems, such as severe allergic reactions. However, the risk of a vaccine causing serious harm, or death, is extremely small.  No serious problems have been identified with shingles vaccine. Mild Problems  Redness, soreness, swelling, or itching at the site of the injection (about 1 person in 3).  Headache (about 1 person in 70). Like all vaccines, shingles vaccine is being closely monitored for unusual or severe problems. WHAT IF THERE IS A MODERATE OR SEVERE REACTION? What should I look for? Any unusual condition, such as a severe allergic reaction or a high fever. If a severe allergic reaction   occurred, it would be within a few minutes to an hour after the shot. Signs of a serious allergic reaction can include difficulty breathing, weakness, hoarseness or wheezing, a fast heartbeat, hives, dizziness, paleness, or swelling of the throat. What should I do?  Call your caregiver, or get the person to a caregiver right away.  Tell the caregiver what  happened, the date and time it happened, and when the vaccination was given.  Ask the caregiver to report the reaction by filing a Vaccine Adverse Event Reporting System (VAERS) form. Or, you can file this report through the VAERS web site at www.vaers.hhs.gov or by calling 1-800-822-7967. VAERS does not provide medical advice. HOW CAN I LEARN MORE?  Ask your caregiver. He or she can give you the vaccine package insert or suggest other sources of information.  Contact the Centers for Disease Control and Prevention (CDC):  Call 1-800-232-4636 (1-800-CDC-INFO).  Visit the CDC website at www.cdc.gov/vaccines CDC Shingles Vaccine VIS (02/04/08) Document Released: 02/12/2006 Document Revised: 07/10/2011 Document Reviewed: 02/04/2008 [employer] Patient Information 2013 [employer].  

## 2012-07-23 ENCOUNTER — Encounter: Payer: Self-pay | Admitting: Pulmonary Disease

## 2012-07-23 ENCOUNTER — Ambulatory Visit (INDEPENDENT_AMBULATORY_CARE_PROVIDER_SITE_OTHER): Payer: Medicare Other | Admitting: Pulmonary Disease

## 2012-07-23 VITALS — BP 128/68 | HR 86 | Temp 98.5°F | Ht 64.0 in | Wt 104.8 lb

## 2012-07-23 DIAGNOSIS — D126 Benign neoplasm of colon, unspecified: Secondary | ICD-10-CM

## 2012-07-23 DIAGNOSIS — K5730 Diverticulosis of large intestine without perforation or abscess without bleeding: Secondary | ICD-10-CM

## 2012-07-23 DIAGNOSIS — R002 Palpitations: Secondary | ICD-10-CM | POA: Diagnosis not present

## 2012-07-23 DIAGNOSIS — I2510 Atherosclerotic heart disease of native coronary artery without angina pectoris: Secondary | ICD-10-CM | POA: Diagnosis not present

## 2012-07-23 DIAGNOSIS — IMO0001 Reserved for inherently not codable concepts without codable children: Secondary | ICD-10-CM

## 2012-07-23 DIAGNOSIS — M545 Low back pain: Secondary | ICD-10-CM

## 2012-07-23 DIAGNOSIS — I10 Essential (primary) hypertension: Secondary | ICD-10-CM | POA: Diagnosis not present

## 2012-07-23 DIAGNOSIS — M810 Age-related osteoporosis without current pathological fracture: Secondary | ICD-10-CM

## 2012-07-23 DIAGNOSIS — F411 Generalized anxiety disorder: Secondary | ICD-10-CM

## 2012-07-23 MED ORDER — CYCLOBENZAPRINE HCL 10 MG PO TABS: 10.0000 mg | ORAL_TABLET | Freq: Three times a day (TID) | ORAL | Status: DC | PRN

## 2012-07-23 MED ORDER — AMLODIPINE BESYLATE 2.5 MG PO TABS: 2.5000 mg | ORAL_TABLET | Freq: Every day | ORAL | Status: DC

## 2012-07-23 MED ORDER — CILIDINIUM-CHLORDIAZEPOXIDE 2.5-5 MG PO CAPS: 1.0000 | ORAL_CAPSULE | Freq: Three times a day (TID) | ORAL | Status: DC | PRN

## 2012-07-23 NOTE — Progress Notes (Addendum)
Subjective:    Patient ID: [name] [name], female    DOB: October 08, 1935, 77 y.o.   MRN: [id number]  HPI 77 y/o WF here for a 6 month follow up visit... she has multiple medical problems including HBP;  CAD/ hx palpit;  Divertics/ IBS/ Polyps/ Angiodysplasia;  LBP & FM;  Osteoporosis;  Anxiety...  ~  January 23, 2011:  553mo ROV & notes sl dizzy, lightheaded, no assoc N/V etc;  Notes dry skin & eval by Vaughan Sine fells that her skin has changed, "I have atopic dermatitis" "I was born w/ eczema";  She had allergy eval by DrESL 7/12> Rx Zyrtek, nasal saline, & allergy vaccine;  We reviewed herprev labs    HBP> on Amlodipine2.5mg  & BP= 136/82; denies HA, CP, palpit, SOB, edema, etc...    CAD & Palpit> on ASA & FishOil; notes some incr palpit w/ stress but [name] [name] helps; knows to avoid caffeine etc; she saw DrHochrein 6/12> doing satis w/o new symptoms, no changes made...    Divertics & Colon Polyps> on Librax; followed by DrDBrodie w/ hx IBS and f/u colon due in 2013...    Hx angiodysplasia> as noted, she denies any recen bleeding and Hg has been stable...    Hx LBP/ FM> on Flexeril prn and she copes very well...    Osteopenia> on Calcium, MVI, & Fosamax drug holiday since 3/11 per DrGottsegen & repeat BMD 3/12 showed TScore -1.8 in Spine (see below)...    Anxiety> on Xanax Prn & Desyrel 100mg /d; she is under a lot of stress still...  ~  July 24, 2011:  553mo ROV & she escribes a bad URI which was hard to shake despite augmentin/ Mucinex/ MMW/ Tussionex/ Align- all called in for her prev; finally better 7 back to baseline;  She had FASTING labs done prior to OV, & requests refill Alprazolam...    HBP> on Amlodipine2.5mg  & BP= 130/72; denies HA, CP, palpit, SOB, edema, etc...    CAD & Palpit> on ASA & FishOil; notes some incr palpit w/ stress but [name] [name] helps; knows to avoid caffeine etc; she saw DrHochrein 6/12> EKG showed NSR, rate84, septal infarct (poor R prog V1-2, otherw neg, NAD; doing satis w/o new  symptoms, no changes made...    Divertics & Colon Polyps> on Librax; followed by DrDBrodie w/ hx IBS and f/u colon due in 2013...    Hx angiodysplasia> as noted, she denies any recent bleeding and Hg has been stable...    Hx LBP/ FM> on Flexeril prn and she copes very well...    Osteopenia> on Calcium, MVI, & Fosamax drug holiday since 3/11 per DrGottsegen & repeat BMD 3/12 showed TScore -1.8 in Spine (see below); we decided to leave her off the Fosamax until f/u BMD 3/14...    Anxiety> on Xanax Prn & Desyrel 100mg /d; she is under a lot of stress still w/ husb illness & daugh divorce...  ~  January 23, 2012:  553mo ROV & [name] [name] was Dx w/ TMJ several weeks ago & has new mouth guard- we discussed assoc w/ anxiety and she has Xanax & Desyrel...     She saw DrVanWinkle for an allergy f/u 6/13 & she was doing satis on allergy shots, Zyrtek & saline, no changes made...    She saw DrHochrein 6/13 for Cards f/u doing satis & no changes made to her med regimen...    We reviewed prob list, meds, xrays and labs> see below >> she already had her 2013 Flu vaccine...  ~  July 23, 2012:  75mo ROV & [name] [name] relates a good bit of stress since she is the caregiver of her family... We reviewed the following medical problems during today's office visit >>     AR> on Zyrtek & allergy shots per DrVanWinkle- seen 6/13 & stable...    HBP> on Amlodipine2.5mg  & BP= 128/68; denies HA, CP, palpit, SOB, edema, etc...    CAD & Palpit> on ASA81 & FishOil; notes some incr palpit w/ stress but [name] [name] helps; knows to avoid caffeine etc; she saw DrHochrein 6/12> EKG showed NSR, rate84, septal infarct (poor R prog V1-2, otherw neg, NAD; doing satis w/o new symptoms, no changes made...    Divertics & Colon Polyps> on Librax; followed by DrDBrodie w/ hx IBS and f/u colon due in 2013=> we will refer...    Hx angiodysplasia> as noted, she denies any recent bleeding and Hg has been stable...    Gyn- DrFernandez> on Estradiol 0.02% one  applic 2x/wk for dryness etc...    Hx LBP/ FM> on Flexeril prn and she copes very well...    Osteopenia> on Calcium, MVI, & still on Fosamax drug holiday (since 3/11 per DrGottsegen) & repeat BMD 3/12 showed TScore -1.8 in Spine (see below); we decided to leave her off the Fosamax until f/u BMD 3/14=> pending...    Anxiety> on Xanax Prn & Desyrel 100mg /d; she is under a lot of stress still w/ husb illness & daugh divorce... We reviewed prob list, meds, xrays and labs> see below for updates >>  LABS 3/14:  FLP- at goals on diet x TG=170;  Chems- wnl;  CBC- wnl;  TSH=1.74;  UA- essent neg... BMD 3/14 showed TScore -2.0 in Lumbar spine (was -1.8 in 2012); Rec to pt to consider re-start of Bisphos therapy but she declines & prefers to stay on Drug Holiday + supplements/ exercise...           Problem List:  Hx of SINUSITIS (ICD-473.9) - uses ZYRTEK OTC, Saline nasal wash, & FLONASE as needed... she notes that her allergies are bothering her and she saw DrESL for allergy re-eval 7/12> still on shots... ~  6/13:  Now followed by DrVan Winkle> allergic rhinitis on shots since 1983, plus Zyrtek Prn & Saline Prn...  Hx of PNEUMONIA (ICD-486) >> resolved ~  CXR 3/12 showed clear lungs, NAD...  HYPERTENSION (ICD-401.9) - controlled on NORVASC 2.5mg /d... ~  9/12:  BP=136/82 & denies HA, fatigue, visual changes, CP, palipit, syncope, dyspnea, edema, etc... ~  3/13:  BP= 130/72 & she remains largely asymptomatic... ~  9/13:  BP= 124/62 & she denies visual symptoms, HA, CP, palpit, dizziness, SOB, edema, etc... ~  3/14:  on Amlodipine2.5mg  & BP= 128/68; denies HA, CP, palpit, SOB, edema, etc...  CORONARY ARTERY DISEASE (ICD-414.00) & Hx of PALPITATIONS (ICD-785.1) - on ASA 81mg /d, and FISH OIL daily... hx coronary spasm w/ non-obstructive CAD on cath 1999 (20-30% diagonal branch LAD only)... sl anterobasal HK & EF=59%... she reports some incr palpit w/ stress- Alpraz 0.5mg  helps... ~  Baseline EKG showed  NSR, septal infarct w/ poor R prog V1-2, otherw wnl/ NAD==> no change on 6/13 tracing. ~  She is followed by DrHochrein & seen yearly> OV 6/13 reviewed==> stable, no new symptoms, no changes made... ~  EKG 6/13 showed NSR, rate75, LAD, qs in V1-2, otherw neg...  DIVERTICULOSIS OF COLON (ICD-562.10), IBS (ICD-564.1), & COLONIC POLYPS (ICD-211.3) >> ~  colonoscopy 12/03 by DrDBrodie showing divertics only... f/u planned 1yrs=> we will  refer ~  eval 3/09 for hematochezia w/ Hg 13.5, Fe 103, repeat colon w/ 3mm polyp= hyperplastic + angiodysplasia... f/u planned 78yrs... ~  2/10: notes some increased abd cramping/ IBS- we will refill her LIBRAX for Prn use... ~  3/14:  She is overdue for f/u colonoscopy & we will refer to GI- Dr Lina Sar...  Hx of COLONIC ANGIODYSPLASIA (ICD-569.84) - as above; no recent bleeding noted & Hg stable...  BACK PAIN, LUMBAR (ICD-724.2) - S/P LLam L4-5 yrs ago... ortho eval 9/06 by DrApplington w/ MRI showing DDD... conservative rx...  FIBROMYALGIA (ICD-729.1) - resting OK w/ TRAZADONE 100mg Qhs & FLEXERIL 10mg  Tid Prn...  OSTEOPOROSIS (ICD-733.00) - prev on FOSAMAX w/ D (she insisted on this Rx), +Calcium, +MVI...  ~  BMD 1/08 was improved w/ TScores -1.5 to -2.1 on FOSAMAX therapy...  ~  7/09:  she wants to discuss stopping Fosamax in favor of Vit K which she read in "[employer]: Personal" newsletter- mult questions answered... we will check Vit D level (normal= 63) and I rec that she continue the Fosamax, calcium supplements, multivit therapy... ~  2/10:  f/u BMD showed TScores -1.3 in Spine, & -1.8 in left FemNeck = improved... ~  3/11:  DrGottsegen placed her on a Bisphos drug holiday... ~  3/12:  f/u BMD showed TScores -1.8 in Spine, & -1.5 in the left [facility] ~  3/13:  We reviewed prev results & decided to continue Calcium, MVI, Vit D, & hold Bisphos therapy until f/u BMD due 3/14... ~  3/14:  F/u BMD shows TScore -2.0 in Spine and -1.6 in left FemNeck; Rec to  restart Bisphos therapy but she decided to continue her drug holiday + calcium, MVI, Vit D, and wt bearing exercise...  ANXIETY (ICD-300.00) - uses ALPRAZOLAM 0.5mg  Tid Prn...  HEALTH MAINTENANCE:  she notes brother had shingles and we discussed the shingles vaccine- I rec that she get this shot at the health dept (NOTE: she reported mild case of Shingles on her left arm treated by [facility] in 2011)... she had PNEUMOVAX in 2007... gets yearly FLU SHOTs... GYN = DrGottsegen/ Lily Peer and he has her on Premarin Rx + vag cream; Neg Mammogram 12/13 at [facility]...   Past Surgical History  Procedure Laterality Date  . Cholecystectomy  03/2003  . Tonsillectomy      as a child  . Lumbar laminectomy      Outpatient Encounter Prescriptions as of 07/23/2012  Medication Sig Dispense Refill  . ALPRAZolam (XANAX) 0.5 MG tablet Take 1/2 to 1 tablet by mouth three times daily as directed  90 tablet  5  . amLODipine (NORVASC) 2.5 MG tablet TAKE ONE TABLET BY MOUTH ONE TIME DAILY  30 tablet  6  . aspirin 81 MG tablet Take 81 mg by mouth daily.        . calcium carbonate (CALCIUM 600) 600 MG TABS Take 4 tablets by mouth daily       . cetirizine (ZYRTEC) 10 MG tablet Take 10 mg by mouth daily.        . clidinium-chlordiazePOXIDE (LIBRAX) 2.5-5 MG per capsule Take 1 capsule by mouth 3 (three) times daily as needed. For abd cramping       . cyclobenzaprine (FLEXERIL) 10 MG tablet Take 10 mg by mouth 3 (three) times daily as needed.        Marland Kitchen EPINEPHrine (EPI-PEN) 0.3 mg/0.3 mL DEVI Inject 0.3 mg into the muscle as needed.      . fish oil-omega-3  fatty acids 1000 MG capsule Take 1 g by mouth daily.        . Multiple Vitamins-Minerals (PROTEGRA PO) Take by mouth daily.        . multivitamin (THERAGRAN) per tablet Take 1 tablet by mouth daily.        . nitroGLYCERIN (NITROSTAT) 0.4 MG SL tablet Place 1 tablet (0.4 mg total) under the tongue every 5 (five) minutes as needed. May repeat 3 times  25 tablet  11  .  NONFORMULARY OR COMPOUNDED ITEM Estradiol .02% 1 ML Prefilled Applicator Sig: apply vaginally twice a week #90 Day Supply with 4 refills  1 each  4  . traZODone (DESYREL) 100 MG tablet Take 1 tablet (100 mg total) by mouth at bedtime.  90 tablet  3  . UNABLE TO FIND Allergy shots weekly       No facility-administered encounter medications on file as of 07/23/2012.    Allergies  Allergen Reactions  . Codeine     REACTION: NAUSEA  . Erythromycin     REACTION: NAUSEA  . Other     Environmental allergies    Current Medications, Allergies, Past Medical History, Past Surgical History, Family History, and Social History were reviewed in [employer] record.    Review of Systems  Constitutional:  Denies F/C/S, anorexia, unexpected weight change. HEENT:  No HA, visual changes, earache, nasal symptoms, sore throat, hoarseness. Resp:  No cough, sputum, hemoptysis; no SOB, tightness, wheezing. Cardio:  No CP, DOE, orthopnea, edema. Notes occas palpit... GI:  Denies N/V/D/C or blood in stool; no reflux, abd pain, distention, or gas. GU:  No dysuria, freq, urgency, hematuria, or flank pain. MS:  Denies joint pain, swelling, tenderness, or decr ROM; no neck pain, back pain, etc. Neuro:  No tremors, seizures, dizziness, syncope, weakness, numbness, gait abn. Skin:  No suspicious lesions; she notes skin rash eval DrHouston. Heme:  No adenopathy, bruising, bleeding. Psyche: Denies confusion, sleep disturbance, hallucinations, anxiety, depression.     Objective:   Physical Exam   WD, WN, 77 y/o WF in NAD... Vital Signs:  Reviewed...  General:  Alert & oriented; pleasant & cooperative... HEENT:  [place]/AT, EOM-wnl, PERRLA, Fundi-benign, EACs-clear, TMs-wnl, NOSE-clear, THROAT-clear & wnl. Neck:  Supple w/ fair ROM; no JVD; normal carotid impulses w/o bruits; no thyromegaly or nodules palpated; no lymphadenopathy. Chest:  Clear to P & A; without wheezes/ rales/ or rhonchi  heard... Heart:  Regular Rhythm; norm S1 & S2 without murmurs/ rubs/ or gallops detected... Abdomen:  Soft & nontender; normal bowel sounds; no organomegaly or masses palpated... Ext:  Normal ROM; without deformities or arthritic changes; no varicose veins, venous insuffic, or edema;  Pulses intact w/o bruits... Neuro:  CNs II-XII intact; motor testing normal; sensory testing normal; gait normal & balance OK... Derm:  No lesions noted; no rash etc... Lymph:  No cervical, supraclavicular, axillary, or inguinal adenopathy palpated...  RADIOLOGY DATA:  Reviewed in the EPIC EMR & discussed w/ the patient...  LABORATORY DATA:  Reviewed in the EPIC EMR & discussed w/ the patient...   Assessment & Plan:    HBP> on Amlodipine2.5mg  & BP well controlled & stable; continue same...     CAD & Palpit> on ASA & FishOil; followed by DrHochrein> doing satis w/o new symptoms, no changes made...     Divertics & Colon Polyps> on Librax; followed by DrDBrodie w/ hx IBS and f/u colon overdue now=> refer...     Hx Angiodysplasia> she denies any  recent bleeding and Hg has been stable...     Hx LBP/ FM> on Flexeril prn and she copes very well...     Osteopenia> on Calcium, MVI, & Fosamax drug holiday since 3/11 per DrGottsegen & repeat BMD 3/12 showed TScore -1.8 in Spine and 3/14 showed TScore -2.0; she declines restart Bisphos 7 prefers to continue drug holiday...     Anxiety> on Xanax Prn & Desyrel 100mg /d; she is under a lot of stress still...   Patient's Medications  New Prescriptions   No medications on file  Previous Medications   ALPRAZOLAM (XANAX) 0.5 MG TABLET    Take 1/2 to 1 tablet by mouth three times daily as directed   ASPIRIN 81 MG TABLET    Take 81 mg by mouth daily.     CALCIUM CARBONATE (CALCIUM 600) 600 MG TABS    Take 4 tablets by mouth daily    CETIRIZINE (ZYRTEC) 10 MG TABLET    Take 10 mg by mouth daily.     EPINEPHRINE (EPI-PEN) 0.3 MG/0.3 ML DEVI    Inject 0.3 mg into the  muscle as needed.   FISH OIL-OMEGA-3 FATTY ACIDS 1000 MG CAPSULE    Take 1 g by mouth daily.     MULTIPLE VITAMINS-MINERALS (PROTEGRA PO)    Take by mouth daily.     MULTIVITAMIN (THERAGRAN) PER TABLET    Take 1 tablet by mouth daily.     NITROGLYCERIN (NITROSTAT) 0.4 MG SL TABLET    Place 1 tablet (0.4 mg total) under the tongue every 5 (five) minutes as needed. May repeat 3 times   NONFORMULARY OR COMPOUNDED ITEM    Estradiol .02% 1 ML Prefilled Applicator Sig: apply vaginally twice a week #90 Day Supply with 4 refills   TRAZODONE (DESYREL) 100 MG TABLET    Take 1 tablet (100 mg total) by mouth at bedtime.   UNABLE TO FIND    Allergy shots weekly  Modified Medications   Modified Medication Previous Medication   AMLODIPINE (NORVASC) 2.5 MG TABLET amLODipine (NORVASC) 2.5 MG tablet      Take 1 tablet (2.5 mg total) by mouth daily.    TAKE ONE TABLET BY MOUTH ONE TIME DAILY   AMLODIPINE (NORVASC) 2.5 MG TABLET amLODipine (NORVASC) 2.5 MG tablet      TAKE ONE TABLET BY MOUTH ONE TIME DAILY    TAKE ONE TABLET BY MOUTH ONE TIME DAILY   CLIDINIUM-CHLORDIAZEPOXIDE (LIBRAX) 2.5-5 MG PER CAPSULE clidinium-chlordiazePOXIDE (LIBRAX) 2.5-5 MG per capsule      Take 1 capsule by mouth 3 (three) times daily as needed. For abd cramping    Take 1 capsule by mouth 3 (three) times daily as needed. For abd cramping    CYCLOBENZAPRINE (FLEXERIL) 10 MG TABLET cyclobenzaprine (FLEXERIL) 10 MG tablet      Take 1 tablet (10 mg total) by mouth 3 (three) times daily as needed.    Take 10 mg by mouth 3 (three) times daily as needed.    Discontinued Medications   No medications on file

## 2012-07-23 NOTE — Patient Instructions (Addendum)
Today we updated your med list in our EPIC system...    Continue your current medications the same...    We refilled the meds you requested...  We reviewed your recent Lab data & gave you a copy for your records...  We will arrange for a follow up Bone Density Test & we will contact you w/ the results when avail...    We will also send a copy to DrFernandez for his review...  Call for any questions...  Let's plan a follow up visit in 18mo, sooner if needed for problems.Marland KitchenMarland Kitchen

## 2012-07-24 DIAGNOSIS — H2510 Age-related nuclear cataract, unspecified eye: Secondary | ICD-10-CM | POA: Diagnosis not present

## 2012-07-25 DIAGNOSIS — J309 Allergic rhinitis, unspecified: Secondary | ICD-10-CM | POA: Diagnosis not present

## 2012-07-29 ENCOUNTER — Ambulatory Visit (INDEPENDENT_AMBULATORY_CARE_PROVIDER_SITE_OTHER)
Admission: RE | Admit: 2012-07-29 | Discharge: 2012-07-29 | Disposition: A | Discharge status: Home / Self Care | Payer: Medicare Other | Source: Ambulatory Visit | Attending: Pulmonary Disease | Admitting: Pulmonary Disease

## 2012-07-29 DIAGNOSIS — M810 Age-related osteoporosis without current pathological fracture: Secondary | ICD-10-CM

## 2012-08-01 ENCOUNTER — Encounter: Payer: Self-pay | Admitting: Anesthesiology

## 2012-08-01 DIAGNOSIS — J309 Allergic rhinitis, unspecified: Secondary | ICD-10-CM | POA: Diagnosis not present

## 2012-08-06 ENCOUNTER — Other Ambulatory Visit: Payer: Self-pay | Admitting: Pulmonary Disease

## 2012-08-08 DIAGNOSIS — J309 Allergic rhinitis, unspecified: Secondary | ICD-10-CM | POA: Diagnosis not present

## 2012-08-15 DIAGNOSIS — J309 Allergic rhinitis, unspecified: Secondary | ICD-10-CM | POA: Diagnosis not present

## 2012-08-20 ENCOUNTER — Telehealth: Payer: Self-pay | Admitting: *Deleted

## 2012-08-20 NOTE — Telephone Encounter (Signed)
Called and spoke with pt about her BMD results per SN.    -2.0 osteopenia in spine Was -1.8 now worse of the bisphos.    recs to restart on the alendronate 70 mg weekly.    Called and spoke with pt and she stated that she did not want to start back on this medication at this time.  She will cont to take the MVI and supplements.  i spoke with SN about pts recs and he is aware and ok to wait for the next BMD to be done.  Nothing further is needed.

## 2012-08-22 DIAGNOSIS — J309 Allergic rhinitis, unspecified: Secondary | ICD-10-CM | POA: Diagnosis not present

## 2012-08-26 ENCOUNTER — Telehealth: Payer: Self-pay | Admitting: Pulmonary Disease

## 2012-08-26 ENCOUNTER — Other Ambulatory Visit: Payer: Self-pay | Admitting: Pulmonary Disease

## 2012-08-26 DIAGNOSIS — D126 Benign neoplasm of colon, unspecified: Secondary | ICD-10-CM

## 2012-08-27 NOTE — Telephone Encounter (Signed)
DOES NOT NEED TO HAVE HER COLON DONE UNTIL 2016 [name] [name]

## 2012-08-29 DIAGNOSIS — J309 Allergic rhinitis, unspecified: Secondary | ICD-10-CM | POA: Diagnosis not present

## 2012-09-05 DIAGNOSIS — J309 Allergic rhinitis, unspecified: Secondary | ICD-10-CM | POA: Diagnosis not present

## 2012-09-13 DIAGNOSIS — J309 Allergic rhinitis, unspecified: Secondary | ICD-10-CM | POA: Diagnosis not present

## 2012-09-19 DIAGNOSIS — J309 Allergic rhinitis, unspecified: Secondary | ICD-10-CM | POA: Diagnosis not present

## 2012-09-24 DIAGNOSIS — L819 Disorder of pigmentation, unspecified: Secondary | ICD-10-CM | POA: Diagnosis not present

## 2012-09-24 DIAGNOSIS — L821 Other seborrheic keratosis: Secondary | ICD-10-CM | POA: Diagnosis not present

## 2012-09-24 DIAGNOSIS — L570 Actinic keratosis: Secondary | ICD-10-CM | POA: Diagnosis not present

## 2012-09-24 DIAGNOSIS — L578 Other skin changes due to chronic exposure to nonionizing radiation: Secondary | ICD-10-CM | POA: Diagnosis not present

## 2012-09-26 DIAGNOSIS — J309 Allergic rhinitis, unspecified: Secondary | ICD-10-CM | POA: Diagnosis not present

## 2012-10-03 DIAGNOSIS — J309 Allergic rhinitis, unspecified: Secondary | ICD-10-CM | POA: Diagnosis not present

## 2012-10-10 DIAGNOSIS — J309 Allergic rhinitis, unspecified: Secondary | ICD-10-CM | POA: Diagnosis not present

## 2012-10-15 ENCOUNTER — Encounter: Payer: Self-pay | Admitting: Cardiology

## 2012-10-15 ENCOUNTER — Ambulatory Visit (INDEPENDENT_AMBULATORY_CARE_PROVIDER_SITE_OTHER): Payer: Medicare Other | Admitting: Cardiology

## 2012-10-15 VITALS — BP 130/90 | HR 73 | Ht 64.0 in | Wt 102.4 lb

## 2012-10-15 DIAGNOSIS — I2510 Atherosclerotic heart disease of native coronary artery without angina pectoris: Secondary | ICD-10-CM

## 2012-10-15 NOTE — Progress Notes (Signed)
HPI The patient presents for followup of hypertension and palpitations. 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   She denies any chest pressure, neck or arm discomfort. She is not having any new palpitations and rarely feels these. She's had no presyncope or syncope. She remains active and just started exercising again recently . She has no new shortness of breath, PND or orthopnea. She's had no weight gain or edema.  She does feel the palpitations occasionally.    Allergies  Allergen Reactions  . Codeine     REACTION: NAUSEA  . Erythromycin     REACTION: NAUSEA  . Other     Environmental allergies    Current Outpatient Prescriptions  Medication Sig Dispense Refill  . ALPRAZolam (XANAX) 0.5 MG tablet Take 1/2 to 1 tablet by mouth three times daily as directed  90 tablet  5  . amLODipine (NORVASC) 2.5 MG tablet Take 1 tablet (2.5 mg total) by mouth daily.  90 tablet  3  . aspirin 81 MG tablet Take 81 mg by mouth daily.        . calcium carbonate (CALCIUM 600) 600 MG TABS Take 4 tablets by mouth daily       . cetirizine (ZYRTEC) 10 MG tablet Take 10 mg by mouth daily.        . clidinium-chlordiazePOXIDE (LIBRAX) 2.5-5 MG per capsule Take 1 capsule by mouth 3 (three) times daily as needed. For abd cramping  10 capsule  5  . cyclobenzaprine (FLEXERIL) 10 MG tablet Take 1 tablet (10 mg total) by mouth 3 (three) times daily as needed.  10 tablet  5  . EPINEPHrine (EPI-PEN) 0.3 mg/0.3 mL DEVI Inject 0.3 mg into the muscle as needed.      . fish oil-omega-3 fatty acids 1000 MG capsule Take 1 g by mouth daily.        . Multiple Vitamins-Minerals (PROTEGRA PO) Take by mouth daily.        . multivitamin (THERAGRAN) per tablet Take 1 tablet by mouth daily.        . nitroGLYCERIN (NITROSTAT) 0.4 MG SL tablet Place 1 tablet (0.4 mg total) under the tongue every 5 (five) minutes as needed. May repeat 3 times  25 tablet  11  . NONFORMULARY OR COMPOUNDED  ITEM Estradiol .02% 1 ML Prefilled Applicator Sig: apply vaginally twice a week #90 Day Supply with 4 refills  1 each  4  . traZODone (DESYREL) 100 MG tablet Take 1 tablet (100 mg total) by mouth at bedtime.  90 tablet  3  . UNABLE TO FIND Allergy shots weekly       No current facility-administered medications for this visit.    Past Medical History  Diagnosis Date  . Palpitations   . History of sinusitis   . History of pneumonia   . Diverticulosis of colon   . Hx of colonic polyps   . Lumbar back pain   . Fibromyalgia   . Anxiety   . MVP (mitral valve prolapse)     No antibiotics required for procedures  . Osteoporosis   . Coronary artery spasm     Non obstructive CAD    Past Surgical History  Procedure Laterality Date  . Cholecystectomy  03/2003  . Tonsillectomy      as a child  . Lumbar laminectomy      ROS:  As stated in the HPI and negative for all other systems.  PHYSICAL EXAM BP 130/90  Pulse 73  Ht 5\' 4"  (1.626 m)  Wt 102 lb 6.4 oz (46.448 kg)  BMI 17.57 kg/m2 GENERAL:  Well appearing NECK:  No jugular venous distention, waveform within normal limits, carotid upstroke brisk and symmetric, no bruits, no thyromegaly LUNGS:  Clear to auscultation bilaterally CHEST:  Unremarkable HEART:  PMI not displaced or sustained,S1 and S2 within normal limits, no S3, no S4, no clicks, no rubs, no murmurs ABD:  Flat, positive bowel sounds normal in frequency in pitch, no bruits, no rebound, no guarding, no midline pulsatile mass, no hepatomegaly, no splenomegaly EXT:  2 plus pulses throughout, no edema, no cyanosis no clubbing  EKG: Normal sinus rhythm, rate 73, left axis, LAFB, poor anterior R-wave progression, no acute ST-T wave changes. No change from previous.  10/15/2012  ASSESSMENT AND PLAN  PALPITATIONS:  The patient has stable pattern of palpitations. She will continue on the meds as listed.  MVP:  I would not suspect any mitral regurgitation. No further  imaging is indicated.

## 2012-10-15 NOTE — Patient Instructions (Addendum)
The current medical regimen is effective;  continue present plan and medications.  Follow up in 1 year with Dr Hochrein.  You will receive a letter in the mail 2 months before you are due.  Please call us when you receive this letter to schedule your follow up appointment.  

## 2012-10-16 DIAGNOSIS — J309 Allergic rhinitis, unspecified: Secondary | ICD-10-CM | POA: Diagnosis not present

## 2012-10-24 DIAGNOSIS — J309 Allergic rhinitis, unspecified: Secondary | ICD-10-CM | POA: Diagnosis not present

## 2012-10-31 DIAGNOSIS — J309 Allergic rhinitis, unspecified: Secondary | ICD-10-CM | POA: Diagnosis not present

## 2012-11-07 ENCOUNTER — Other Ambulatory Visit: Payer: Self-pay | Admitting: Pulmonary Disease

## 2012-11-07 MED ORDER — ALPRAZOLAM 0.5 MG PO TABS: ORAL_TABLET | ORAL | Status: DC

## 2012-11-14 DIAGNOSIS — J309 Allergic rhinitis, unspecified: Secondary | ICD-10-CM | POA: Diagnosis not present

## 2012-11-21 DIAGNOSIS — J309 Allergic rhinitis, unspecified: Secondary | ICD-10-CM | POA: Diagnosis not present

## 2012-11-25 ENCOUNTER — Other Ambulatory Visit: Payer: Self-pay

## 2012-11-25 MED ORDER — NONFORMULARY OR COMPOUNDED ITEM: Status: DC

## 2012-11-28 DIAGNOSIS — J309 Allergic rhinitis, unspecified: Secondary | ICD-10-CM | POA: Diagnosis not present

## 2012-12-02 DIAGNOSIS — J309 Allergic rhinitis, unspecified: Secondary | ICD-10-CM | POA: Diagnosis not present

## 2012-12-05 DIAGNOSIS — J309 Allergic rhinitis, unspecified: Secondary | ICD-10-CM | POA: Diagnosis not present

## 2012-12-12 DIAGNOSIS — J309 Allergic rhinitis, unspecified: Secondary | ICD-10-CM | POA: Diagnosis not present

## 2012-12-19 DIAGNOSIS — J309 Allergic rhinitis, unspecified: Secondary | ICD-10-CM | POA: Diagnosis not present

## 2012-12-26 DIAGNOSIS — J309 Allergic rhinitis, unspecified: Secondary | ICD-10-CM | POA: Diagnosis not present

## 2013-01-02 DIAGNOSIS — J309 Allergic rhinitis, unspecified: Secondary | ICD-10-CM | POA: Diagnosis not present

## 2013-01-06 ENCOUNTER — Other Ambulatory Visit: Payer: Self-pay | Admitting: Pulmonary Disease

## 2013-01-08 DIAGNOSIS — J3081 Allergic rhinitis due to animal (cat) (dog) hair and dander: Secondary | ICD-10-CM | POA: Diagnosis not present

## 2013-01-08 DIAGNOSIS — H1045 Other chronic allergic conjunctivitis: Secondary | ICD-10-CM | POA: Diagnosis not present

## 2013-01-08 DIAGNOSIS — J309 Allergic rhinitis, unspecified: Secondary | ICD-10-CM | POA: Diagnosis not present

## 2013-01-08 DIAGNOSIS — J301 Allergic rhinitis due to pollen: Secondary | ICD-10-CM | POA: Diagnosis not present

## 2013-01-08 DIAGNOSIS — J3089 Other allergic rhinitis: Secondary | ICD-10-CM | POA: Diagnosis not present

## 2013-01-15 DIAGNOSIS — J309 Allergic rhinitis, unspecified: Secondary | ICD-10-CM | POA: Diagnosis not present

## 2013-01-23 DIAGNOSIS — J309 Allergic rhinitis, unspecified: Secondary | ICD-10-CM | POA: Diagnosis not present

## 2013-01-27 ENCOUNTER — Encounter: Payer: Self-pay | Admitting: Pulmonary Disease

## 2013-01-27 ENCOUNTER — Ambulatory Visit (INDEPENDENT_AMBULATORY_CARE_PROVIDER_SITE_OTHER): Payer: Medicare Other | Admitting: Pulmonary Disease

## 2013-01-27 VITALS — BP 114/68 | HR 75 | Temp 97.4°F | Ht 64.0 in | Wt 106.2 lb

## 2013-01-27 DIAGNOSIS — K5730 Diverticulosis of large intestine without perforation or abscess without bleeding: Secondary | ICD-10-CM | POA: Diagnosis not present

## 2013-01-27 DIAGNOSIS — D126 Benign neoplasm of colon, unspecified: Secondary | ICD-10-CM

## 2013-01-27 DIAGNOSIS — I2510 Atherosclerotic heart disease of native coronary artery without angina pectoris: Secondary | ICD-10-CM | POA: Diagnosis not present

## 2013-01-27 DIAGNOSIS — IMO0001 Reserved for inherently not codable concepts without codable children: Secondary | ICD-10-CM

## 2013-01-27 DIAGNOSIS — F411 Generalized anxiety disorder: Secondary | ICD-10-CM

## 2013-01-27 DIAGNOSIS — M545 Low back pain, unspecified: Secondary | ICD-10-CM

## 2013-01-27 DIAGNOSIS — R002 Palpitations: Secondary | ICD-10-CM

## 2013-01-27 DIAGNOSIS — I10 Essential (primary) hypertension: Secondary | ICD-10-CM | POA: Diagnosis not present

## 2013-01-27 DIAGNOSIS — K589 Irritable bowel syndrome without diarrhea: Secondary | ICD-10-CM

## 2013-01-27 DIAGNOSIS — G4700 Insomnia, unspecified: Secondary | ICD-10-CM

## 2013-01-27 DIAGNOSIS — M810 Age-related osteoporosis without current pathological fracture: Secondary | ICD-10-CM

## 2013-01-27 MED ORDER — AMLODIPINE BESYLATE 2.5 MG PO TABS: 2.5000 mg | ORAL_TABLET | Freq: Every day | ORAL | Status: DC

## 2013-01-27 MED ORDER — ALPRAZOLAM 0.5 MG PO TABS: ORAL_TABLET | ORAL | Status: DC

## 2013-01-27 NOTE — Patient Instructions (Addendum)
Today we updated your med list in our EPIC system...    Continue your current medications the same...    We refilled the meds you requested...  Call for any questions...  Let's plan a follow up visit in 6mo, sooner if needed for problems...   

## 2013-01-27 NOTE — Progress Notes (Signed)
Subjective:    Patient ID: [name] [name], female    DOB: [birth year], 77 y.o.   MRN: [id number]  HPI 77 y/o WF here for a 6 month follow up visit... she has multiple medical problems including HBP;  CAD/ hx palpit;  Divertics/ IBS/ Polyps/ Angiodysplasia;  LBP & FM;  Osteoporosis;  Anxiety...  ~  July 24, 2011:  88mo ROV & she escribes a bad URI which was hard to shake despite augmentin/ Mucinex/ MMW/ Tussionex/ Align- all called in for her prev; finally better 7 back to baseline;  She had FASTING labs done prior to OV, & requests refill Alprazolam...    HBP> on Amlodipine2.5mg  & BP= 130/72; denies HA, CP, palpit, SOB, edema, etc...    CAD & Palpit> on ASA & FishOil; notes some incr palpit w/ stress but Rebeca Allegra helps; knows to avoid caffeine etc; she saw DrHochrein 6/12> EKG showed NSR, rate84, septal infarct (poor R prog V1-2, otherw neg, NAD; doing satis w/o new symptoms, no changes made...    Divertics & Colon Polyps> on Librax; followed by DrDBrodie w/ hx IBS and f/u colon due in 2013...    Hx angiodysplasia> as noted, she denies any recent bleeding and Hg has been stable...    Hx LBP/ FM> on Flexeril prn and she copes very well...    Osteopenia> on Calcium, MVI, & Fosamax drug holiday since 3/11 per DrGottsegen & repeat BMD 3/12 showed TScore -1.8 in Spine (see below); we decided to leave her off the Fosamax until f/u BMD 3/14...    Anxiety> on Xanax Prn & Desyrel 100mg /d; she is under a lot of stress still w/ husb illness & daugh divorce...  ~  January 23, 2012:  88mo ROV & [name] [name] was Dx w/ TMJ several weeks ago & has new mouth guard- we discussed assoc w/ anxiety and she has Xanax & Desyrel...     She saw DrVanWinkle for an allergy f/u 6/13 & she was doing satis on allergy shots, Zyrtek & saline, no changes made...    She saw DrHochrein 6/13 for Cards f/u doing satis & no changes made to her med regimen...    We reviewed prob list, meds, xrays and labs> see below >> she already had her  2013 Flu vaccine...  ~  July 23, 2012:  88mo ROV & [name] [name] relates a good bit of stress since she is the caregiver of her family... We reviewed the following medical problems during today's office visit >>     AR> on Zyrtek & allergy shots per DrVanWinkle- seen 6/13 & stable...    HBP> on Amlodipine2.5mg  & BP= 128/68; denies HA, CP, palpit, SOB, edema, etc...    CAD & Palpit> on ASA81 & FishOil; notes some incr palpit w/ stress but Rebeca Allegra helps; knows to avoid caffeine etc; she saw DrHochrein 6/12> EKG showed NSR, rate84, septal infarct (poor R prog V1-2, otherw neg, NAD; doing satis w/o new symptoms, no changes made...    Divertics & Colon Polyps> on Librax; followed by DrDBrodie w/ hx IBS and f/u colon due in 2013=> we will refer...    Hx angiodysplasia> as noted, she denies any recent bleeding and Hg has been stable...    Gyn- DrFernandez> on Estradiol 0.02% one applic 2x/wk for dryness etc...    Hx LBP/ FM> on Flexeril prn and she copes very well...    Osteopenia> on Calcium, MVI, & still on Fosamax drug holiday (since 3/11 per DrGottsegen) & repeat BMD 3/12 showed TScore -1.8  in Spine (see below); we decided to leave her off the Fosamax until f/u BMD 3/14=> pending...    Anxiety> on Xanax Prn & Desyrel 100mg /d; she is under a lot of stress still w/ husb illness & daugh divorce... We reviewed prob list, meds, xrays and labs> see below for updates >>  LABS 3/14:  FLP- at goals on diet x TG=170;  Chems- wnl;  CBC- wnl;  TSH=1.74;  UA- essent neg... BMD 3/14 showed TScore -2.0 in Lumbar spine (was -1.8 in 2012); Rec to pt to consider re-start of Bisphos therapy but she declines & prefers to stay on Drug Holiday + supplements/ exercise...  ~  January 27, 2013:  1mo ROV & [name] [name] is stable, she has mult minor somatic complaints (not sleeping well, superfic varicosities, etc) but overall stable...     BP controlled on Amlod2.5 & BP= 114/68 w/o CP, palpit, SOB, edema, etc; followed for Cards by  DrHochrein on ASA81 & prn NTG (hasn't needed)...     GI followed by DrDBrodie w/ divertics, colon polyps and hx angiodysplasia; no bleeding episodes, uses Librax prn...    Hx LBP & FM- on Flex10 prn & doing satis w/ minor somatic complaints as noted...    Anxiety treated w/ Alpraz0.5 as needed & the Desyrel100 Qhs helps her rest...  We reviewed prob list, meds, xrays and labs> see below for updates >> she had the 2014 Flu vaccine in Sept... Refills today as requested...            Problem List:  Hx of SINUSITIS (ICD-473.9) - uses ZYRTEK OTC, Saline nasal wash, & FLONASE as needed... she notes that her allergies are bothering her and she saw DrESL for allergy re-eval 7/12> still on shots... ~  6/13:  Now followed by DrVan Winkle> allergic rhinitis on shots since 1983, plus Zyrtek Prn & Saline Prn...  Hx of PNEUMONIA (ICD-486) >> resolved ~  CXR 3/12 showed clear lungs, NAD...  HYPERTENSION (ICD-401.9) - controlled on NORVASC 2.5mg /d... ~  9/12:  BP=136/82 & denies HA, fatigue, visual changes, CP, palipit, syncope, dyspnea, edema, etc... ~  3/13:  BP= 130/72 & she remains largely asymptomatic... ~  9/13:  BP= 124/62 & she denies visual symptoms, HA, CP, palpit, dizziness, SOB, edema, etc... ~  3/14:  on Amlodipine2.5mg  & BP= 128/68; denies HA, CP, palpit, SOB, edema, etc... ~  9/14: BP controlled on Amlod2.5 & BP= 114/68 w/o CP, palpit, SOB, edema, etc.  CORONARY ARTERY DISEASE (ICD-414.00) & Hx of PALPITATIONS (ICD-785.1) - on ASA 81mg /d, and FISH OIL daily... hx coronary spasm w/ non-obstructive CAD on cath 1999 (20-30% diagonal branch LAD only)... sl anterobasal HK & EF=59%... she reports some incr palpit w/ stress- Alpraz 0.5mg  helps... ~  Baseline EKG showed NSR, septal infarct w/ poor R prog V1-2, otherw wnl/ NAD==> no change on 6/13 tracing. ~  She is followed by DrHochrein & seen yearly> OV 6/13 reviewed==> stable, no new symptoms, no changes made... ~  EKG 6/13 showed NSR, rate75,  LAD, qs in V1-2, otherw neg... ~  She saw DrHochrein 6/14> HBP & palpit; noted mod anxiety, no CP or new palpit, syncope, etc; ?MVP, no murmur heard- stable & no changes made. ~  EKG 6/14 showed NSR, rate73, LAD, old septal infarct, NAD...   DIVERTICULOSIS OF COLON (ICD-562.10), IBS (ICD-564.1), & COLONIC POLYPS (ICD-211.3) >> ~  colonoscopy 12/03 by DrDBrodie showing divertics only... f/u planned 52yrs=> we will refer ~  eval 3/09 for hematochezia w/ Hg 13.5,  Fe 103, repeat colon w/ 3mm polyp= hyperplastic + angiodysplasia... f/u planned 40yrs... ~  2/10: notes some increased abd cramping/ IBS- we will refill her LIBRAX for Prn use... ~  3/14:  She is overdue for f/u colonoscopy & we will refer to GI- Dr Lina Sar...  Hx of COLONIC ANGIODYSPLASIA (ICD-569.84) - as above; no recent bleeding noted & Hg stable...  BACK PAIN, LUMBAR (ICD-724.2) - S/P LLam L4-5 yrs ago... ortho eval 9/06 by DrApplington w/ MRI showing DDD... conservative rx...  FIBROMYALGIA (ICD-729.1) - resting OK w/ TRAZADONE 100mg Qhs & FLEXERIL 10mg  Tid Prn...  OSTEOPOROSIS (ICD-733.00) - prev on FOSAMAX w/ D (she insisted on this Rx), +Calcium, +MVI...  ~  BMD 1/08 was improved w/ TScores -1.5 to -2.1 on FOSAMAX therapy...  ~  7/09:  she wants to discuss stopping Fosamax in favor of Vit K which she read in "[employer]: Personal" newsletter- mult questions answered... we will check Vit D level (normal= 63) and I rec that she continue the Fosamax, calcium supplements, multivit therapy... ~  2/10:  f/u BMD showed TScores -1.3 in Spine, & -1.8 in left FemNeck = improved... ~  3/11:  DrGottsegen placed her on a Bisphos drug holiday... ~  3/12:  f/u BMD showed TScores -1.8 in Spine, & -1.5 in the left [facility] ~  3/13:  We reviewed prev results & decided to continue Calcium, MVI, Vit D, & hold Bisphos therapy until f/u BMD due 3/14... ~  3/14:  F/u BMD shows TScore -2.0 in Spine and -1.6 in left FemNeck; Rec to restart Bisphos  therapy but she decided to continue her drug holiday + calcium, MVI, Vit D, and wt bearing exercise...  ANXIETY (ICD-300.00) - uses ALPRAZOLAM 0.5mg  Tid Prn...  HEALTH MAINTENANCE:  she notes brother had shingles and we discussed the shingles vaccine- I rec that she get this shot at the health dept (NOTE: she reported mild case of Shingles on her left arm treated by [facility] in 2011)... she had PNEUMOVAX in 2007... gets yearly FLU SHOTs... GYN = DrGottsegen/ Lily Peer and he has her on Premarin Rx + vag cream; Neg M[facility] 12/13 [facility]   Past Surgical History  Procedure Laterality Date  . Cholecystectomy  03/2003  . Tonsillectomy      as a child  . Lumbar laminectomy      Outpatient Encounter Prescriptions as of 01/27/2013  Medication Sig Dispense Refill  . ALPRAZolam (XANAX) 0.5 MG tablet Take 1/2 to 1 tablet by mouth three times daily as directed  90 tablet  5  . amLODipine (NORVASC) 2.5 MG tablet Take 1 tablet (2.5 mg total) by mouth daily.  90 tablet  3  . aspirin 81 MG tablet Take 81 mg by mouth daily.        . calcium carbonate (CALCIUM 600) 600 MG TABS Take 4 tablets by mouth daily       . cetirizine (ZYRTEC) 10 MG tablet Take 10 mg by mouth daily.        . clidinium-chlordiazePOXIDE (LIBRAX) 2.5-5 MG per capsule Take 1 capsule by mouth 3 (three) times daily as needed. For abd cramping  10 capsule  5  . cyclobenzaprine (FLEXERIL) 10 MG tablet Take 1 tablet (10 mg total) by mouth 3 (three) times daily as needed.  10 tablet  5  . EPINEPHrine (EPI-PEN) 0.3 mg/0.3 mL DEVI Inject 0.3 mg into the muscle as needed.      . fish oil-omega-3 fatty acids 1000 MG capsule Take 1  g by mouth daily.        . Multiple Vitamins-Minerals (PROTEGRA PO) Take by mouth daily.        . multivitamin (THERAGRAN) per tablet Take 1 tablet by mouth daily.        . nitroGLYCERIN (NITROSTAT) 0.4 MG SL tablet Place 1 tablet (0.4 mg total) under the tongue every 5 (five) minutes as needed. May repeat 3 times   25 tablet  11  . NONFORMULARY OR COMPOUNDED ITEM Estradiol .02% 1 ML Prefilled Applicator Sig: apply vaginally twice a week #90 Day Supply with 4 refills  1 each  3  . traZODone (DESYREL) 100 MG tablet Take one tablet by mouth   nightly at bedtime  90 tablet  2  . UNABLE TO FIND Allergy shots weekly       No facility-administered encounter medications on file as of 01/27/2013.    Allergies  Allergen Reactions  . Codeine     REACTION: NAUSEA  . Erythromycin     REACTION: NAUSEA  . Other     Environmental allergies    Current Medications, Allergies, Past Medical History, Past Surgical History, Family History, and Social History were reviewed in [employer] record.    Review of Systems  Constitutional:  Denies F/C/S, anorexia, unexpected weight change. HEENT:  No HA, visual changes, earache, nasal symptoms, sore throat, hoarseness. Resp:  No cough, sputum, hemoptysis; no SOB, tightness, wheezing. Cardio:  No CP, DOE, orthopnea, edema. Notes occas palpit... GI:  Denies N/V/D/C or blood in stool; no reflux, abd pain, distention, or gas. GU:  No dysuria, freq, urgency, hematuria, or flank pain. MS:  Denies joint pain, swelling, tenderness, or decr ROM; no neck pain, back pain, etc. Neuro:  No tremors, seizures, dizziness, syncope, weakness, numbness, gait abn. Skin:  No suspicious lesions; she notes skin rash eval DrHouston. Heme:  No adenopathy, bruising, bleeding. Psyche: Denies confusion, sleep disturbance, hallucinations, anxiety, depression.     Objective:   Physical Exam   WD, WN, 77 y/o WF in NAD... Vital Signs:  Reviewed...  General:  Alert & oriented; pleasant & cooperative... HEENT:  [place]/AT, EOM-wnl, PERRLA, Fundi-benign, EACs-clear, TMs-wnl, NOSE-clear, THROAT-clear & wnl. Neck:  Supple w/ fair ROM; no JVD; normal carotid impulses w/o bruits; no thyromegaly or nodules palpated; no lymphadenopathy. Chest:  Clear to P & A; without wheezes/  rales/ or rhonchi heard... Heart:  Regular Rhythm; norm S1 & S2 without murmurs/ rubs/ or gallops detected... Abdomen:  Soft & nontender; normal bowel sounds; no organomegaly or masses palpated... Ext:  Normal ROM; without deformities or arthritic changes; no varicose veins, venous insuffic, or edema;  Pulses intact w/o bruits... Neuro:  CNs II-XII intact; motor testing normal; sensory testing normal; gait normal & balance OK... Derm:  No lesions noted; no rash etc... Lymph:  No cervical, supraclavicular, axillary, or inguinal adenopathy palpated...  RADIOLOGY DATA:  Reviewed in the EPIC EMR & discussed w/ the patient...  LABORATORY DATA:  Reviewed in the EPIC EMR & discussed w/ the patient...   Assessment & Plan:    HBP> on Amlodipine2.5mg  & BP well controlled & stable; continue same...     CAD & Palpit> on ASA & FishOil; followed by DrHochrein> doing satis w/o new symptoms, no changes made...     Divertics & Colon Polyps> on Librax; followed by DrDBrodie w/ hx IBS and f/u colon overdue now=> refer...     Hx Angiodysplasia> she denies any recent bleeding and Hg has been stable.Marland KitchenMarland Kitchen  Hx LBP/ FM> on Flexeril prn and she copes very well...     Osteopenia> on Calcium, MVI, & Fosamax drug holiday since 3/11 per DrGottsegen & repeat BMD 3/12 showed TScore -1.8 in Spine and 3/14 showed TScore -2.0; she declines restart Bisphos 7 prefers to continue drug holiday...     Anxiety> on Xanax Prn & Desyrel 100mg /d; she is under a lot of stress still...   Patient's Medications  New Prescriptions   AZITHROMYCIN (ZITHROMAX) 250 MG TABLET    Take as directed   CHLORPHENIRAMINE-HYDROCODONE (TUSSIONEX PENNKINETIC ER) 10-8 MG/5ML LQCR    Take 5 mLs by mouth every 12 (twelve) hours as needed for cough.   DIPHENHYD-HYDROCORT-NYSTATIN (FIRST-DUKES MOUTHWASH) SUSP    Gargle and swallow 1 teaspoon four times a day   METHYLPREDNISOLONE (MEDROL) 4 MG TABLET    As directed, 6 day pak  Previous Medications    ASPIRIN 81 MG TABLET    Take 81 mg by mouth daily.     CALCIUM CARBONATE (CALCIUM 600) 600 MG TABS    Take 4 tablets by mouth daily    CETIRIZINE (ZYRTEC) 10 MG TABLET    Take 10 mg by mouth daily.     CLIDINIUM-CHLORDIAZEPOXIDE (LIBRAX) 2.5-5 MG PER CAPSULE    Take 1 capsule by mouth 3 (three) times daily as needed. For abd cramping   CYCLOBENZAPRINE (FLEXERIL) 10 MG TABLET    Take 1 tablet (10 mg total) by mouth 3 (three) times daily as needed.   EPINEPHRINE (EPI-PEN) 0.3 MG/0.3 ML DEVI    Inject 0.3 mg into the muscle as needed.   FISH OIL-OMEGA-3 FATTY ACIDS 1000 MG CAPSULE    Take 1 g by mouth daily.     MULTIPLE VITAMINS-MINERALS (PROTEGRA PO)    Take by mouth daily.     MULTIVITAMIN (THERAGRAN) PER TABLET    Take 1 tablet by mouth daily.     NITROGLYCERIN (NITROSTAT) 0.4 MG SL TABLET    Place 1 tablet (0.4 mg total) under the tongue every 5 (five) minutes as needed. May repeat 3 times   NONFORMULARY OR COMPOUNDED ITEM    Estradiol .02% 1 ML Prefilled Applicator Sig: apply vaginally twice a week #90 Day Supply with 4 refills   TRAZODONE (DESYREL) 100 MG TABLET    Take one tablet by mouth   nightly at bedtime   UNABLE TO FIND    Allergy shots weekly  Modified Medications   Modified Medication Previous Medication   ALPRAZOLAM (XANAX) 0.5 MG TABLET ALPRAZolam (XANAX) 0.5 MG tablet      Take 1/2 to 1 tablet by mouth three times daily as directed    Take 1/2 to 1 tablet by mouth three times daily as directed   AMLODIPINE (NORVASC) 2.5 MG TABLET amLODipine (NORVASC) 2.5 MG tablet      Take one tablet by mouth one time daily    Take 1 tablet (2.5 mg total) by mouth daily.  Discontinued Medications   No medications on file

## 2013-01-30 DIAGNOSIS — J309 Allergic rhinitis, unspecified: Secondary | ICD-10-CM | POA: Diagnosis not present

## 2013-02-03 ENCOUNTER — Other Ambulatory Visit: Payer: Self-pay | Admitting: Pulmonary Disease

## 2013-02-07 DIAGNOSIS — J309 Allergic rhinitis, unspecified: Secondary | ICD-10-CM | POA: Diagnosis not present

## 2013-02-13 DIAGNOSIS — J309 Allergic rhinitis, unspecified: Secondary | ICD-10-CM | POA: Diagnosis not present

## 2013-02-20 DIAGNOSIS — J309 Allergic rhinitis, unspecified: Secondary | ICD-10-CM | POA: Diagnosis not present

## 2013-02-27 DIAGNOSIS — J309 Allergic rhinitis, unspecified: Secondary | ICD-10-CM | POA: Diagnosis not present

## 2013-03-06 DIAGNOSIS — J309 Allergic rhinitis, unspecified: Secondary | ICD-10-CM | POA: Diagnosis not present

## 2013-03-13 DIAGNOSIS — J309 Allergic rhinitis, unspecified: Secondary | ICD-10-CM | POA: Diagnosis not present

## 2013-03-20 DIAGNOSIS — J309 Allergic rhinitis, unspecified: Secondary | ICD-10-CM | POA: Diagnosis not present

## 2013-03-26 DIAGNOSIS — J309 Allergic rhinitis, unspecified: Secondary | ICD-10-CM | POA: Diagnosis not present

## 2013-03-31 DIAGNOSIS — L821 Other seborrheic keratosis: Secondary | ICD-10-CM | POA: Diagnosis not present

## 2013-03-31 DIAGNOSIS — L219 Seborrheic dermatitis, unspecified: Secondary | ICD-10-CM | POA: Diagnosis not present

## 2013-03-31 DIAGNOSIS — L570 Actinic keratosis: Secondary | ICD-10-CM | POA: Diagnosis not present

## 2013-03-31 DIAGNOSIS — L819 Disorder of pigmentation, unspecified: Secondary | ICD-10-CM | POA: Diagnosis not present

## 2013-04-03 DIAGNOSIS — J309 Allergic rhinitis, unspecified: Secondary | ICD-10-CM | POA: Diagnosis not present

## 2013-04-09 DIAGNOSIS — J309 Allergic rhinitis, unspecified: Secondary | ICD-10-CM | POA: Diagnosis not present

## 2013-04-16 ENCOUNTER — Telehealth: Payer: Self-pay | Admitting: *Deleted

## 2013-04-16 MED ORDER — HYDROCOD POLST-CHLORPHEN POLST 10-8 MG/5ML PO LQCR: 5.0000 mL | Freq: Two times a day (BID) | ORAL | Status: DC | PRN

## 2013-04-16 NOTE — Telephone Encounter (Signed)
Pt came in for an appt today with her husband.  She stated that for the last couple of days she has been having sinus congestion, chest congestion, body aches and just not feeling good.  Pt stated that she has been using the mucinex daily and had some tussionex that she used last night to help with her cough.  Pt is requesting that something be sent in to the pharmacy.  SN please advise. Thanks  Allergies  Allergen Reactions  . Codeine     REACTION: NAUSEA  . Erythromycin     REACTION: NAUSEA  . Other     Environmental allergies     Current Outpatient Prescriptions on File Prior to Visit  Medication Sig Dispense Refill  . ALPRAZolam (XANAX) 0.5 MG tablet Take 1/2 to 1 tablet by mouth three times daily as directed  90 tablet  5  . amLODipine (NORVASC) 2.5 MG tablet Take one tablet by mouth one time daily  30 tablet  4  . aspirin 81 MG tablet Take 81 mg by mouth daily.        . calcium carbonate (CALCIUM 600) 600 MG TABS Take 4 tablets by mouth daily       . cetirizine (ZYRTEC) 10 MG tablet Take 10 mg by mouth daily.        . clidinium-chlordiazePOXIDE (LIBRAX) 2.5-5 MG per capsule Take 1 capsule by mouth 3 (three) times daily as needed. For abd cramping  10 capsule  5  . cyclobenzaprine (FLEXERIL) 10 MG tablet Take 1 tablet (10 mg total) by mouth 3 (three) times daily as needed.  10 tablet  5  . EPINEPHrine (EPI-PEN) 0.3 mg/0.3 mL DEVI Inject 0.3 mg into the muscle as needed.      . fish oil-omega-3 fatty acids 1000 MG capsule Take 1 g by mouth daily.        . Multiple Vitamins-Minerals (PROTEGRA PO) Take by mouth daily.        . multivitamin (THERAGRAN) per tablet Take 1 tablet by mouth daily.        . nitroGLYCERIN (NITROSTAT) 0.4 MG SL tablet Place 1 tablet (0.4 mg total) under the tongue every 5 (five) minutes as needed. May repeat 3 times  25 tablet  11  . NONFORMULARY OR COMPOUNDED ITEM Estradiol .02% 1 ML Prefilled Applicator Sig: apply vaginally twice a week #90 Day Supply with 4  refills  1 each  3  . traZODone (DESYREL) 100 MG tablet Take one tablet by mouth   nightly at bedtime  90 tablet  2  . UNABLE TO FIND Allergy shots weekly       No current facility-administered medications on file prior to visit.

## 2013-04-16 NOTE — Telephone Encounter (Signed)
Per SN---  Ok to print out rx for the tussionex.  This has been done and given to the pt.  Pt is aware that if the drainage she is having starts to have any discoloration she will call back for further recs.  Nothing further is needed.

## 2013-04-17 ENCOUNTER — Telehealth: Payer: Self-pay | Admitting: Pulmonary Disease

## 2013-04-17 MED ORDER — AZITHROMYCIN 250 MG PO TABS: ORAL_TABLET | ORAL | Status: DC

## 2013-04-17 NOTE — Telephone Encounter (Signed)
Per SN---  Ok to send in zpak #1  Take as directed Tylenol as needed Fluids rest

## 2013-04-17 NOTE — Telephone Encounter (Signed)
Pt aware of recs.  Nothing further needed. 

## 2013-04-17 NOTE — Telephone Encounter (Signed)
I called and spoke with pt. She c/o fever of 100.0, chills, body aches, sore throat, blowing out clear phlem, dry cough, sore chest x Tuesday. Pt has tussionex. She is requesting further recs. Please advise SN thanks  Allergies  Allergen Reactions  . Codeine     REACTION: NAUSEA  . Erythromycin     REACTION: NAUSEA  . Other     Environmental allergies

## 2013-04-28 ENCOUNTER — Telehealth: Payer: Self-pay | Admitting: Pulmonary Disease

## 2013-04-28 MED ORDER — METHYLPREDNISOLONE 4 MG PO TABS: ORAL_TABLET | ORAL | Status: DC

## 2013-04-28 MED ORDER — FIRST-DUKES MOUTHWASH MT SUSP: OROMUCOSAL | Status: DC

## 2013-04-28 NOTE — Telephone Encounter (Signed)
Per SN---  1.  MMW  #4 oz  1 tsp gargle and swallow four times daily 2.  Medrol dosepak #1  Take as directed 3.  Rest, fluids, hot tea with honey and lemon

## 2013-04-28 NOTE — Telephone Encounter (Signed)
Pt returned triage's call & can be reached at [phone].  [name] [name]

## 2013-04-28 NOTE — Telephone Encounter (Signed)
I called and spoke with pt. She c/o dry cough, sore throat, chest hurts/burning/tightness, slight pain under shoulder blades, throat feels clogged. We called in ZPAK 04/17/13. Please advise SN thanks  Allergies  Allergen Reactions  . Codeine     REACTION: NAUSEA  . Erythromycin     REACTION: NAUSEA  . Other     Environmental allergies     Current Outpatient Prescriptions on File Prior to Visit  Medication Sig Dispense Refill  . ALPRAZolam (XANAX) 0.5 MG tablet Take 1/2 to 1 tablet by mouth three times daily as directed  90 tablet  5  . amLODipine (NORVASC) 2.5 MG tablet Take one tablet by mouth one time daily  30 tablet  4  . aspirin 81 MG tablet Take 81 mg by mouth daily.        Marland Kitchen azithromycin (ZITHROMAX) 250 MG tablet Take as directed  6 tablet  0  . calcium carbonate (CALCIUM 600) 600 MG TABS Take 4 tablets by mouth daily       . cetirizine (ZYRTEC) 10 MG tablet Take 10 mg by mouth daily.        . chlorpheniramine-HYDROcodone (TUSSIONEX PENNKINETIC ER) 10-8 MG/5ML LQCR Take 5 mLs by mouth every 12 (twelve) hours as needed for cough.  120 mL  0  . clidinium-chlordiazePOXIDE (LIBRAX) 2.5-5 MG per capsule Take 1 capsule by mouth 3 (three) times daily as needed. For abd cramping  10 capsule  5  . cyclobenzaprine (FLEXERIL) 10 MG tablet Take 1 tablet (10 mg total) by mouth 3 (three) times daily as needed.  10 tablet  5  . EPINEPHrine (EPI-PEN) 0.3 mg/0.3 mL DEVI Inject 0.3 mg into the muscle as needed.      . fish oil-omega-3 fatty acids 1000 MG capsule Take 1 g by mouth daily.        . Multiple Vitamins-Minerals (PROTEGRA PO) Take by mouth daily.        . multivitamin (THERAGRAN) per tablet Take 1 tablet by mouth daily.        . nitroGLYCERIN (NITROSTAT) 0.4 MG SL tablet Place 1 tablet (0.4 mg total) under the tongue every 5 (five) minutes as needed. May repeat 3 times  25 tablet  11  . NONFORMULARY OR COMPOUNDED ITEM Estradiol .02% 1 ML Prefilled Applicator Sig: apply vaginally twice a  week #90 Day Supply with 4 refills  1 each  3  . traZODone (DESYREL) 100 MG tablet Take one tablet by mouth   nightly at bedtime  90 tablet  2  . UNABLE TO FIND Allergy shots weekly       No current facility-administered medications on file prior to visit.

## 2013-04-28 NOTE — Telephone Encounter (Signed)
Spoke with spouse and LMTCB x1 for pt to return call

## 2013-04-28 NOTE — Telephone Encounter (Signed)
Pt advised and rx sent. Jennifer Castillo, CMA  

## 2013-05-06 ENCOUNTER — Encounter: Payer: Self-pay | Admitting: Gynecology

## 2013-05-06 DIAGNOSIS — Z1231 Encounter for screening mammogram for malignant neoplasm of breast: Secondary | ICD-10-CM | POA: Diagnosis not present

## 2013-05-08 DIAGNOSIS — J309 Allergic rhinitis, unspecified: Secondary | ICD-10-CM | POA: Diagnosis not present

## 2013-05-15 DIAGNOSIS — J309 Allergic rhinitis, unspecified: Secondary | ICD-10-CM | POA: Diagnosis not present

## 2013-05-19 DIAGNOSIS — J3081 Allergic rhinitis due to animal (cat) (dog) hair and dander: Secondary | ICD-10-CM | POA: Diagnosis not present

## 2013-05-19 DIAGNOSIS — J301 Allergic rhinitis due to pollen: Secondary | ICD-10-CM | POA: Diagnosis not present

## 2013-05-19 DIAGNOSIS — J019 Acute sinusitis, unspecified: Secondary | ICD-10-CM | POA: Diagnosis not present

## 2013-05-19 DIAGNOSIS — J3089 Other allergic rhinitis: Secondary | ICD-10-CM | POA: Diagnosis not present

## 2013-05-29 DIAGNOSIS — J309 Allergic rhinitis, unspecified: Secondary | ICD-10-CM | POA: Diagnosis not present

## 2013-06-04 DIAGNOSIS — J301 Allergic rhinitis due to pollen: Secondary | ICD-10-CM | POA: Diagnosis not present

## 2013-06-04 DIAGNOSIS — J019 Acute sinusitis, unspecified: Secondary | ICD-10-CM | POA: Diagnosis not present

## 2013-06-04 DIAGNOSIS — J3089 Other allergic rhinitis: Secondary | ICD-10-CM | POA: Diagnosis not present

## 2013-06-04 DIAGNOSIS — J3081 Allergic rhinitis due to animal (cat) (dog) hair and dander: Secondary | ICD-10-CM | POA: Diagnosis not present

## 2013-06-12 DIAGNOSIS — J309 Allergic rhinitis, unspecified: Secondary | ICD-10-CM | POA: Diagnosis not present

## 2013-06-19 DIAGNOSIS — J309 Allergic rhinitis, unspecified: Secondary | ICD-10-CM | POA: Diagnosis not present

## 2013-06-30 DIAGNOSIS — J309 Allergic rhinitis, unspecified: Secondary | ICD-10-CM | POA: Diagnosis not present

## 2013-07-03 DIAGNOSIS — J309 Allergic rhinitis, unspecified: Secondary | ICD-10-CM | POA: Diagnosis not present

## 2013-07-07 ENCOUNTER — Other Ambulatory Visit: Payer: Self-pay | Admitting: Pulmonary Disease

## 2013-07-10 DIAGNOSIS — J309 Allergic rhinitis, unspecified: Secondary | ICD-10-CM | POA: Diagnosis not present

## 2013-07-17 DIAGNOSIS — J309 Allergic rhinitis, unspecified: Secondary | ICD-10-CM | POA: Diagnosis not present

## 2013-07-21 ENCOUNTER — Telehealth: Payer: Self-pay | Admitting: Pulmonary Disease

## 2013-07-21 DIAGNOSIS — E7800 Pure hypercholesterolemia, unspecified: Secondary | ICD-10-CM

## 2013-07-21 DIAGNOSIS — K5730 Diverticulosis of large intestine without perforation or abscess without bleeding: Secondary | ICD-10-CM

## 2013-07-21 DIAGNOSIS — F411 Generalized anxiety disorder: Secondary | ICD-10-CM

## 2013-07-21 DIAGNOSIS — I10 Essential (primary) hypertension: Secondary | ICD-10-CM

## 2013-07-21 NOTE — Telephone Encounter (Signed)
Pt has pending appt with SN 07/28/13. Requesting labs done prior. Please advise SN thanks

## 2013-07-22 NOTE — Telephone Encounter (Signed)
Called and spoke with pt and she is aware that the labs are in the computer.  Pt will come by this week for these. Nothing further is needed.

## 2013-07-23 ENCOUNTER — Other Ambulatory Visit (INDEPENDENT_AMBULATORY_CARE_PROVIDER_SITE_OTHER): Payer: Medicare Other

## 2013-07-23 ENCOUNTER — Ambulatory Visit (INDEPENDENT_AMBULATORY_CARE_PROVIDER_SITE_OTHER): Payer: Medicare Other | Admitting: Gynecology

## 2013-07-23 ENCOUNTER — Encounter: Payer: Self-pay | Admitting: Gynecology

## 2013-07-23 VITALS — BP 114/72 | Ht 63.0 in | Wt 103.0 lb

## 2013-07-23 DIAGNOSIS — I10 Essential (primary) hypertension: Secondary | ICD-10-CM

## 2013-07-23 DIAGNOSIS — M8580 Other specified disorders of bone density and structure, unspecified site: Secondary | ICD-10-CM

## 2013-07-23 DIAGNOSIS — M899 Disorder of bone, unspecified: Secondary | ICD-10-CM | POA: Diagnosis not present

## 2013-07-23 DIAGNOSIS — K5730 Diverticulosis of large intestine without perforation or abscess without bleeding: Secondary | ICD-10-CM

## 2013-07-23 DIAGNOSIS — M949 Disorder of cartilage, unspecified: Secondary | ICD-10-CM

## 2013-07-23 DIAGNOSIS — F411 Generalized anxiety disorder: Secondary | ICD-10-CM

## 2013-07-23 DIAGNOSIS — N898 Other specified noninflammatory disorders of vagina: Secondary | ICD-10-CM

## 2013-07-23 DIAGNOSIS — N949 Unspecified condition associated with female genital organs and menstrual cycle: Secondary | ICD-10-CM | POA: Diagnosis not present

## 2013-07-23 DIAGNOSIS — Z79890 Hormone replacement therapy (postmenopausal): Secondary | ICD-10-CM

## 2013-07-23 DIAGNOSIS — N952 Postmenopausal atrophic vaginitis: Secondary | ICD-10-CM | POA: Diagnosis not present

## 2013-07-23 DIAGNOSIS — E7800 Pure hypercholesterolemia, unspecified: Secondary | ICD-10-CM

## 2013-07-23 LAB — LIPID PANEL
Cholesterol: 197 mg/dL (ref 0–200)
HDL: 111.9 mg/dL (ref >39.00)
LDL Cholesterol: 62 mg/dL (ref 0–99)
Total CHOL/HDL Ratio: 2
Triglycerides: 116 mg/dL (ref 0.0–149.0)
VLDL: 23.2 mg/dL (ref 0.0–40.0)

## 2013-07-23 LAB — HEPATIC FUNCTION PANEL
ALBUMIN: 4.2 g/dL (ref 3.5–5.2)
ALK PHOS: 67 U/L (ref 39–117)
ALT: 20 U/L (ref 0–35)
AST: 23 U/L (ref 0–37)
Bilirubin, Direct: 0 mg/dL (ref 0.0–0.3)
TOTAL PROTEIN: 7 g/dL (ref 6.0–8.3)
Total Bilirubin: 0.8 mg/dL (ref 0.3–1.2)

## 2013-07-23 LAB — CBC WITH DIFFERENTIAL/PLATELET
Basophils Absolute: 0 10*3/uL (ref 0.0–0.1)
Basophils Relative: 0.7 % (ref 0.0–3.0)
EOS ABS: 0 10*3/uL (ref 0.0–0.7)
Eosinophils Relative: 0.2 % (ref 0.0–5.0)
HCT: 42 % (ref 36.0–46.0)
Hemoglobin: 14.3 g/dL (ref 12.0–15.0)
LYMPHS ABS: 2.2 10*3/uL (ref 0.7–4.0)
Lymphocytes Relative: 40 % (ref 12.0–46.0)
MCHC: 33.9 g/dL (ref 30.0–36.0)
MCV: 103.3 fl — ABNORMAL HIGH (ref 78.0–100.0)
MONOS PCT: 10.3 % (ref 3.0–12.0)
Monocytes Absolute: 0.6 10*3/uL (ref 0.1–1.0)
Neutro Abs: 2.7 10*3/uL (ref 1.4–7.7)
Neutrophils Relative %: 48.8 % (ref 43.0–77.0)
PLATELETS: 283 10*3/uL (ref 150.0–400.0)
RBC: 4.07 Mil/uL (ref 3.87–5.11)
RDW: 13.3 % (ref 11.5–14.6)
WBC: 5.5 10*3/uL (ref 4.5–10.5)

## 2013-07-23 LAB — BASIC METABOLIC PANEL
BUN: 10 mg/dL (ref 6–23)
CHLORIDE: 103 meq/L (ref 96–112)
CO2: 29 meq/L (ref 19–32)
Calcium: 9.8 mg/dL (ref 8.4–10.5)
Creatinine, Ser: 0.7 mg/dL (ref 0.4–1.2)
GFR: 90.91 mL/min (ref >60.00)
GLUCOSE: 82 mg/dL (ref 70–99)
POTASSIUM: 4.4 meq/L (ref 3.5–5.1)
SODIUM: 141 meq/L (ref 135–145)

## 2013-07-23 LAB — TSH: TSH: 1.27 u[IU]/mL (ref 0.35–5.50)

## 2013-07-23 MED ORDER — NONFORMULARY OR COMPOUNDED ITEM: Status: DC

## 2013-07-23 NOTE — Patient Instructions (Signed)

## 2013-07-23 NOTE — Progress Notes (Signed)
[name] [name] June 10, 1935 [id number]   History:    78 y.o. GYN followup exam. Patient with history of vaginal atrophy has done well on the vaginal estradiol twice a week. Sometimes she may experience a little odor no true discharge and no vaginal bleeding reported.Review of patient's records indicated that in the past and she had had history of osteoporosis and had been on Fosamax for 5 years and discontinued in 2012 is currently on a drug holiday. Her last bone density study was in 2014 [name] T score was the AP spine and -2.2.She is taking her calcium and vitamin D on a regular basis and is pretty active.Her primary physician is Dr.Nadell who she is scheduled to see  Him  next week and we'll be drawing her blood work as well as scheduling her overdue colonoscopy . Patient with no prior history of abnormal Pap smear. All her vaccines are up-to-date by her PCP.  Last colonoscopy 2009. Patient with past history of colon polyps benign   Past medical history,surgical history, family history and social history were all reviewed and documented in the EPIC chart.  Gynecologic History No LMP recorded. Patient is postmenopausal. Contraception: post menopausal status Last Pap: 2012. Results were: normal Last mammogram: 2015. Results were: normal  Obstetric History OB History  Gravida Para Term Preterm AB SAB TAB Ectopic Multiple Living  1 1 1   0     1    # Outcome Date GA Lbr Len/2nd Weight Sex Delivery Anes PTL Lv  1 TRM                ROS: A ROS was performed and pertinent positives and negatives are included in the history.  GENERAL: No fevers or chills. HEENT: No change in vision, no earache, sore throat or sinus congestion. NECK: No pain or stiffness. CARDIOVASCULAR: No chest pain or pressure. No palpitations. PULMONARY: No shortness of breath, cough or wheeze. GASTROINTESTINAL: No abdominal pain, nausea, vomiting or diarrhea, melena or bright red blood per rectum. GENITOURINARY: No  urinary frequency, urgency, hesitancy or dysuria. MUSCULOSKELETAL: No joint or muscle pain, no back pain, no recent trauma. DERMATOLOGIC: No rash, no itching, no lesions. ENDOCRINE: No polyuria, polydipsia, no heat or cold intolerance. No recent change in weight. HEMATOLOGICAL: No anemia or easy bruising or bleeding. NEUROLOGIC: No headache, seizures, numbness, tingling or weakness. PSYCHIATRIC: No depression, no loss of interest in normal activity or change in sleep pattern.     Exam: chaperone present  BP 114/72  Ht 5\' 3"  (1.6 m)  Wt 103 lb (46.72 kg)  BMI 18.25 kg/m2  Body mass index is 18.25 kg/(m^2).  General appearance : Well developed well nourished female. No acute distress HEENT: Neck supple, trachea midline, no carotid bruits, no thyroidmegaly Lungs: Clear to auscultation, no rhonchi or wheezes, or rib retractions  Heart: Regular rate and rhythm, no murmurs or gallops Breast:Examined in sitting and supine position were symmetrical in appearance, no palpable masses or tenderness,  no skin retraction, no nipple inversion, no nipple discharge, no skin discoloration, no axillary or supraclavicular lymphadenopathy Abdomen: no palpable masses or tenderness, no rebound or guarding Extremities: no edema or skin discoloration or tenderness  Pelvic:  Bartholin, Urethra, Skene Glands: Within normal limits             Vagina: No gross lesions or discharge, vaginal atrophy  Cervix: No gross lesions or discharge  Uterus  anteverted, normal size, shape and consistency, non-tender and mobile  Adnexa  Without masses or tenderness  Anus and perineum  normal   Rectovaginal  normal sphincter tone without palpated masses or tenderness             Hemoccult PCP we'll provide     Assessment/Plan:  78 y.o. female doing well on vaginal estrogen twice a week for vaginal atrophy. Prescription refill was provided. Pap smear was not done today in accordance to the new guidelines. Patient will be due  for her next bone density study in 2014. She will check with her PCP in reference to her colonoscopy since she does have history of colon polyps her last colonoscopy was in 2009. Patient will continue with her calcium and vitamin D as well as regular exercise for bone health.  Note: This dictation was prepared with  Dragon/digital dictation along withSmart phrase technology. Any transcriptional errors that result from this process are unintentional.   Terrance Mass MD, 11:18 AM 07/23/2013

## 2013-07-24 DIAGNOSIS — J309 Allergic rhinitis, unspecified: Secondary | ICD-10-CM | POA: Diagnosis not present

## 2013-07-28 ENCOUNTER — Ambulatory Visit (INDEPENDENT_AMBULATORY_CARE_PROVIDER_SITE_OTHER): Payer: Medicare Other | Admitting: Pulmonary Disease

## 2013-07-28 ENCOUNTER — Ambulatory Visit (INDEPENDENT_AMBULATORY_CARE_PROVIDER_SITE_OTHER)
Admission: RE | Admit: 2013-07-28 | Discharge: 2013-07-28 | Disposition: A | Discharge status: Home / Self Care | Payer: Medicare Other | Source: Ambulatory Visit | Attending: Pulmonary Disease | Admitting: Pulmonary Disease

## 2013-07-28 ENCOUNTER — Encounter: Payer: Self-pay | Admitting: Pulmonary Disease

## 2013-07-28 VITALS — BP 120/82 | HR 76 | Temp 98.1°F | Ht 64.0 in | Wt 103.6 lb

## 2013-07-28 DIAGNOSIS — R05 Cough: Secondary | ICD-10-CM

## 2013-07-28 DIAGNOSIS — F411 Generalized anxiety disorder: Secondary | ICD-10-CM

## 2013-07-28 DIAGNOSIS — D126 Benign neoplasm of colon, unspecified: Secondary | ICD-10-CM

## 2013-07-28 DIAGNOSIS — R059 Cough, unspecified: Secondary | ICD-10-CM

## 2013-07-28 DIAGNOSIS — I10 Essential (primary) hypertension: Secondary | ICD-10-CM

## 2013-07-28 DIAGNOSIS — M545 Low back pain, unspecified: Secondary | ICD-10-CM

## 2013-07-28 DIAGNOSIS — G4700 Insomnia, unspecified: Secondary | ICD-10-CM

## 2013-07-28 DIAGNOSIS — R002 Palpitations: Secondary | ICD-10-CM

## 2013-07-28 DIAGNOSIS — H2510 Age-related nuclear cataract, unspecified eye: Secondary | ICD-10-CM | POA: Diagnosis not present

## 2013-07-28 DIAGNOSIS — K5730 Diverticulosis of large intestine without perforation or abscess without bleeding: Secondary | ICD-10-CM

## 2013-07-28 DIAGNOSIS — I2510 Atherosclerotic heart disease of native coronary artery without angina pectoris: Secondary | ICD-10-CM

## 2013-07-28 DIAGNOSIS — M8580 Other specified disorders of bone density and structure, unspecified site: Secondary | ICD-10-CM

## 2013-07-28 DIAGNOSIS — J309 Allergic rhinitis, unspecified: Secondary | ICD-10-CM

## 2013-07-28 DIAGNOSIS — K589 Irritable bowel syndrome without diarrhea: Secondary | ICD-10-CM

## 2013-07-28 DIAGNOSIS — IMO0001 Reserved for inherently not codable concepts without codable children: Secondary | ICD-10-CM

## 2013-07-28 DIAGNOSIS — K5520 Angiodysplasia of colon without hemorrhage: Secondary | ICD-10-CM

## 2013-07-28 MED ORDER — TRAZODONE HCL 100 MG PO TABS: ORAL_TABLET | ORAL | Status: DC

## 2013-07-28 MED ORDER — AMLODIPINE BESYLATE 2.5 MG PO TABS: ORAL_TABLET | ORAL | Status: DC

## 2013-07-28 MED ORDER — CILIDINIUM-CHLORDIAZEPOXIDE 2.5-5 MG PO CAPS: 1.0000 | ORAL_CAPSULE | Freq: Three times a day (TID) | ORAL | Status: DC | PRN

## 2013-07-28 MED ORDER — NITROGLYCERIN 0.4 MG SL SUBL: 0.4000 mg | SUBLINGUAL_TABLET | SUBLINGUAL | Status: DC | PRN

## 2013-07-28 NOTE — Patient Instructions (Signed)
Today we updated your med list in our EPIC system...    Continue your current medications the same...    We refilled the meds you requested...  Today we did your follow up CXR & reviewed your recent fasting blood work...    We will contact you w/ the results when available...   Call for any questions or if we can be of service in any way.Marland KitchenMarland Kitchen

## 2013-07-28 NOTE — Progress Notes (Signed)
Subjective:    Patient ID: [name] [name], female    DOB: 13-Jul-1935, 78 y.o.   MRN: [id number]  HPI 78 y/o WF here for a 6 month follow up visit... she has multiple medical problems including HBP;  CAD/ hx palpit;  Divertics/ IBS/ Polyps/ Angiodysplasia;  LBP & FM;  Osteoporosis;  Anxiety...  ~  January 23, 2012:  65mo ROV & [name] was Dx w/ TMJ several weeks ago & has new mouth guard- we discussed assoc w/ anxiety and she has Xanax & Desyrel...     She saw [name] [name] for an allergy f/u 6/13 & she was doing satis on allergy shots, Zyrtek & saline, no changes made...    She saw [name] [name] 6/13 for Cards f/u doing satis & no changes made to her med regimen...    We reviewed prob list, meds, xrays and labs> see below >> she already had her 2013 Flu vaccine...  ~  July 23, 2012:  51mo ROV & [name] [name] relates a good bit of stress since she is the caregiver of her family... We reviewed the following medical problems during today's office visit >>     AR> on Zyrtek & allergy shots per [name] [name]- seen 6/13 & stable...    HBP> on Amlodipine2.5mg  & BP= 128/68; denies HA, CP, palpit, SOB, edema, etc...    CAD & Palpit> on ASA81 & FishOil; notes some incr palpit w/ stress but [name] [name] helps; knows to avoid caffeine etc; she saw [name] [name] 6/12> EKG showed NSR, rate84, septal infarct (poor R prog V1-2, otherw neg, NAD; doing satis w/o new symptoms, no changes made...    Divertics & Colon Polyps> on Librax; followed by [name] [name] w/ hx IBS and f/u colon due in 2013=> we will refer...    Hx angiodysplasia> as noted, she denies any recent bleeding and Hg has been stable...    Gyn- [name] [name]> on Estradiol 2.75% one applic 2x/wk for dryness etc...    Hx LBP/ FM> on Flexeril prn and she copes very well...    Osteopenia> on Calcium, MVI, & still on Fosamax drug holiday (since 3/11 per [name] [name]) & repeat BMD 3/12 showed TScore -1.8 in Spine (see below); we decided to leave her off the Fosamax until f/u BMD  3/14=> pending...    Anxiety> on Xanax Prn & Desyrel 100mg /d; she is under a lot of stress still w/ husb illness & daugh divorce... We reviewed prob list, meds, xrays and labs> see below for updates >>   LABS 3/14:  FLP- at goals on diet x TG=170;  Chems- wnl;  CBC- wnl;  TSH=1.74;  UA- essent neg...  BMD 3/14 showed TScore -2.0 in Lumbar spine (was -1.8 in 2012); Rec to pt to consider re-start of Bisphos therapy but she declines & prefers to stay on Drug Holiday + supplements/ exercise...  ~  January 27, 2013:  46mo ROV & [name] [name] is stable, she has mult minor somatic complaints (not sleeping well, superfic varicosities, etc) but overall stable...     BP controlled on Amlod2.5 & BP= 114/68 w/o CP, palpit, SOB, edema, etc; followed for Cards by [name] [name] on ASA81 & prn NTG (hasn't needed)...     GI followed by [name] [name] w/ divertics, colon polyps and hx angiodysplasia; no bleeding episodes, uses Librax prn...    Hx LBP & FM- on Flex10 prn & doing satis w/ minor somatic complaints as noted...    Anxiety treated w/ Alpraz0.5 as needed & the Desyrel100 Qhs helps her rest...  We reviewed prob  list, meds, xrays and labs> see below for updates >> she had the 2014 Flu vaccine in Sept... Refills today as requested...   ~  July 28, 2013:  67mo ROV & [name] [name] notes that her allergies are acting up & she has some arthritis in left thumb- rec to take Zyrtek, Saline, and try hot soaks & Aleve prn; she is under a lot of stress at home w/ husb illness, not resting well, etc... We reviewed the following medical problems during today's office visit >>     AR> on Zyrtek & allergy shots per [name] [name]- seen 6/13 & stable...    HBP> on Amlodipine2.5mg  & BP= 120/82; denies HA, CP, palpit, SOB, edema, etc...    CAD & Palpit> on ASA81 & FishOil; notes some palpit w/ stress but [name] [name] helps; knows to avoid caffeine etc; she saw [name] [name] 6/12> EKG showed NSR, rate84, septal infarct (poor R prog V1-2, otherw neg, NAD);  doing satis w/o new symptoms...     Divertics & Colon Polyps> on Librax; followed by [name] [name] w/ hx IBS and last colon 2009 showed AVM in cecum (not bleeding) & 59mm hyperplastic polyp in sigmoid...    Hx angiodysplasia> as noted, she denies any recent bleeding and Hg has been stable...    Gyn- [name] [name]> on Estradiol 1.61% one applic 2x/wk for dryness etc; she was seen 3/15 & note reviewed; mammogram 1/15 was neg...    Hx LBP/ FM> on Flexeril prn and she copes very well...    Osteopenia> on Calcium, MVI, & still on Fosamax drug holiday (since 3/11 per [name] [name]/Fernandez) & BMD 3/12 showed TScore -1.8 in Spine (see below); we decided to leave her off the Fosamax until f/u BMD 6/14 w/ Tscore -2.0 in Spine; Rec to restart Bisphos therapy but she decided to continue her drug holiday + calcium, MVI, Vit D, and wt bearing exercise.    Anxiety> on Xanax Prn & Desyrel 100mg /d; she is under a lot of stress still w/ husb illness & daugh divorce... We reviewed prob list, meds, xrays and labs> see below for updates >> refills written per request...  CXR 3/15 showed norm heart size, clear lungs, NAD...  LABS 3/15:  FLP- at goals on diet w/ great HDL;  Chems- wnl;  CBC- wnl;  TSH=1.27           Problem List:  Hx of SINUSITIS (ICD-473.9) - uses ZYRTEK OTC, Saline nasal wash, & FLONASE as needed... she notes that her allergies are bothering her and she saw [name] [name] for allergy re-eval 7/12> still on shots... ~  6/13:  Now followed by DrVan Winkle> allergic rhinitis on shots since 1983, plus Zyrtek Prn & Saline Prn...  Hx of PNEUMONIA (ICD-486) >> resolved ~  CXR 3/12 showed clear lungs, NAD...  HYPERTENSION (ICD-401.9) - controlled on NORVASC 2.5mg /d... ~  9/12:  BP=136/82 & denies HA, fatigue, visual changes, CP, palipit, syncope, dyspnea, edema, etc... ~  3/13:  BP= 130/72 & she remains largely asymptomatic... ~  9/13:  BP= 124/62 & she denies visual symptoms, HA, CP, palpit, dizziness, SOB, edema,  etc... ~  3/14:  on Amlodipine2.5mg  & BP= 128/68; denies HA, CP, palpit, SOB, edema, etc... ~  9/14: BP controlled on Amlod2.5 & BP= 114/68 w/o CP, palpit, SOB, edema, etc. ~  3/15: on Amlodipine2.5mg  & BP= 120/82; denies HA, CP, palpit, SOB, edema, etc...  CORONARY ARTERY DISEASE (ICD-414.00) & Hx of PALPITATIONS (ICD-785.1) - on ASA 81mg /d, and FISH OIL daily... hx coronary spasm w/ non-obstructive CAD  on cath 1999 (20-30% diagonal branch LAD only)... sl anterobasal HK & EF=59%... she reports some incr palpit w/ stress- Alpraz 0.5mg  helps... ~  Baseline EKG showed NSR, septal infarct w/ poor R prog V1-2, otherw wnl/ NAD==> no change on 6/13 tracing. ~  She is followed by [name] [name] & seen yearly> OV 6/13 reviewed==> stable, no new symptoms, no changes made... ~  EKG 6/13 showed NSR, rate75, LAD, qs in V1-2, otherw neg... ~  She saw [name] [name] 6/14> HBP & palpit; noted mod anxiety, no CP or new palpit, syncope, etc; ?MVP, no murmur heard- stable & no changes made. ~  EKG 6/14 showed NSR, rate73, LAD, old septal infarct, NAD...   DIVERTICULOSIS OF COLON (ICD-562.10), IBS (ICD-564.1), & COLONIC POLYPS (ICD-211.3) >> ~  colonoscopy 12/03 by [name] [name] showing divertics only...  ~  eval 3/09 for hematochezia w/ Hg 13.5, Fe 103, repeat colon w/ 43mm polyp= hyperplastic + angiodysplasia... f/u planned 12yrs... ~  2/10: notes some increased abd cramping/ IBS- we will refill her LIBRAX for Prn use...  Hx of COLONIC ANGIODYSPLASIA (ICD-569.84) - as above; no recent bleeding noted & Hg stable... ~  Colonoscopy 3/09 w/ AVM in cecum, not bleeding...   BACK PAIN, LUMBAR (ICD-724.2) - S/P LLam L4-5 yrs ago... ortho eval 9/06 by Dr[place] w/ MRI showing DDD... conservative rx...  FIBROMYALGIA (ICD-729.1) - resting OK w/ TRAZADONE 100mg Qhs & FLEXERIL 10mg  Tid Prn...  OSTEOPOROSIS (ICD-733.00) - prev on FOSAMAX w/ D (she insisted on this Rx), +Calcium, +MVI...  ~  BMD 1/08 was improved w/ TScores -1.5 to  -2.1 on FOSAMAX therapy...  ~  7/09:  she wants to discuss stopping Fosamax in favor of Vit K which she read in "[employer]: Personal" newsletter- mult questions answered... we will check Vit D level (normal= 63) and I rec that she continue the Fosamax, calcium supplements, multivit therapy... ~  2/10:  f/u BMD showed TScores -1.3 in Spine, & -1.8 in left FemNeck = improved... ~  3/11:  [name] [name] placed her on a Bisphos drug holiday... ~  3/12:  f/u BMD showed TScores -1.8 in Spine, & -1.5 in the left [facility] ~  3/13:  We reviewed prev results & decided to continue Calcium, MVI, Vit D, & hold Bisphos therapy until f/u BMD due 3/14... ~  3/14:  F/u BMD shows TScore -2.0 in Spine and -1.6 in left FemNeck; Rec to restart Bisphos therapy but she decided to continue her drug holiday + calcium, MVI, Vit D, and wt bearing exercise...  ANXIETY (ICD-300.00) - uses ALPRAZOLAM 0.5mg  Tid Prn...  HEALTH MAINTENANCE:  she notes brother had shingles and we discussed the shingles vaccine- I rec that she get this shot at the health dept (NOTE: she reported mild case of Shingles on her left arm treated by [facility] in 2011)... she had PNEUMOVAX in 2007... gets yearly FLU SHOTs... GYN = [name] [name]/ Toney Rakes and he has her on Premarin Rx + vag cream; Neg Mammogram 12/13 at [facility]...   Past Surgical History  Procedure Laterality Date  . Cholecystectomy  03/2003  . Tonsillectomy      as a child  . Lumbar laminectomy      Outpatient Encounter Prescriptions as of 07/28/2013  Medication Sig  . ALPRAZolam (XANAX) 0.5 MG tablet Take 1/2 to 1 tablet by mouth three times daily as directed  . amLODipine (NORVASC) 2.5 MG tablet TAKE ONE TABLET BY MOUTH ONE TIME DAILY   . aspirin 81 MG tablet Take 81 mg by mouth daily.    Marland Kitchen  calcium carbonate (CALCIUM 600) 600 MG TABS Take 4 tablets by mouth daily   . cetirizine (ZYRTEC) 10 MG tablet Take 10 mg by mouth daily.    . clidinium-chlordiazePOXIDE (LIBRAX) 2.5-5 MG per  capsule Take 1 capsule by mouth 3 (three) times daily as needed. For abd cramping  . cyclobenzaprine (FLEXERIL) 10 MG tablet Take 1 tablet (10 mg total) by mouth 3 (three) times daily as needed.  . Diphenhyd-Hydrocort-Nystatin (FIRST-DUKES MOUTHWASH) SUSP Gargle and swallow 1 teaspoon four times a day  . EPINEPHrine (EPI-PEN) 0.3 mg/0.3 mL DEVI Inject 0.3 mg into the muscle as needed.  . fish oil-omega-3 fatty acids 1000 MG capsule Take 1 g by mouth daily.    . Multiple Vitamins-Minerals (PROTEGRA PO) Take by mouth daily.    . multivitamin (THERAGRAN) per tablet Take 1 tablet by mouth daily.    . nitroGLYCERIN (NITROSTAT) 0.4 MG SL tablet Place 1 tablet (0.4 mg total) under the tongue every 5 (five) minutes as needed. May repeat 3 times  . NONFORMULARY OR COMPOUNDED ITEM Estradiol .02% 1 ML Prefilled Applicator Sig: apply vaginally twice a week #90 Day Supply with 4 refills  . traZODone (DESYREL) 100 MG tablet Take one tablet by mouth   nightly at bedtime  . UNABLE TO FIND Allergy shots weekly  . [DISCONTINUED] azithromycin (ZITHROMAX) 250 MG tablet Take as directed  . [DISCONTINUED] chlorpheniramine-HYDROcodone (TUSSIONEX PENNKINETIC ER) 10-8 MG/5ML LQCR Take 5 mLs by mouth every 12 (twelve) hours as needed for cough.  . [DISCONTINUED] methylPREDNISolone (MEDROL) 4 MG tablet As directed, 6 day pak    Allergies  Allergen Reactions  . Codeine     REACTION: NAUSEA  . Erythromycin     REACTION: NAUSEA  . Other     Environmental allergies    Current Medications, Allergies, Past Medical History, Past Surgical History, Family History, and Social History were reviewed in [employer] record.    Review of Systems  Constitutional:  Denies F/C/S, anorexia, unexpected weight change. HEENT:  No HA, visual changes, earache, nasal symptoms, sore throat, hoarseness. Resp:  No cough, sputum, hemoptysis; no SOB, tightness, wheezing. Cardio:  No CP, DOE, orthopnea, edema.  Notes occas palpit... GI:  Denies N/V/D/C or blood in stool; no reflux, abd pain, distention, or gas. GU:  No dysuria, freq, urgency, hematuria, or flank pain. MS:  Denies joint pain, swelling, tenderness, or decr ROM; no neck pain, back pain, etc. Neuro:  No tremors, seizures, dizziness, syncope, weakness, numbness, gait abn. Skin:  No suspicious lesions; she notes skin rash eval DrHouston. Heme:  No adenopathy, bruising, bleeding. Psyche: Denies confusion, sleep disturbance, hallucinations, anxiety, depression.     Objective:   Physical Exam   WD, WN, 78 y/o WF in NAD... Vital Signs:  Reviewed...  General:  Alert & oriented; pleasant & cooperative... HEENT:  [place]/AT, EOM-wnl, PERRLA, Fundi-benign, EACs-clear, TMs-wnl, NOSE-clear, THROAT-clear & wnl. Neck:  Supple w/ fair ROM; no JVD; normal carotid impulses w/o bruits; no thyromegaly or nodules palpated; no lymphadenopathy. Chest:  Clear to P & A; without wheezes/ rales/ or rhonchi heard... Heart:  Regular Rhythm; norm S1 & S2 without murmurs/ rubs/ or gallops detected... Abdomen:  Soft & nontender; normal bowel sounds; no organomegaly or masses palpated... Ext:  Normal ROM; without deformities or arthritic changes; no varicose veins, venous insuffic, or edema;  Pulses intact w/o bruits... Neuro:  CNs II-XII intact; motor testing normal; sensory testing normal; gait normal & balance OK... Derm:  No lesions noted; no  rash etc... Lymph:  No cervical, supraclavicular, axillary, or inguinal adenopathy palpated...  RADIOLOGY DATA:  Reviewed in the EPIC EMR & discussed w/ the patient...  LABORATORY DATA:  Reviewed in the EPIC EMR & discussed w/ the patient...   Assessment & Plan:    HBP> on Amlodipine2.5mg  & BP well controlled & stable; continue same...     CAD & Palpit> on ASA & FishOil; followed by [name] [name]> doing satis w/o new symptoms, no changes made...     Divertics & Colon Polyps> on Librax; followed by [name] [name] w/ hx IBS  and stable...     Hx Angiodysplasia> AVM in cecum 3/09 colon; she denies any recent bleeding and Hg has been stable...     Hx LBP/ FM> on Flexeril prn and she copes very well...     Osteopenia> on Calcium, MVI, & Fosamax drug holiday since 3/11 per [name] [name] & repeat BMD 3/12 showed TScore -1.8 in Spine and 3/14 showed TScore -2.0; she declines restart Bisphos & prefers to continue drug holiday...     Anxiety> on Xanax Prn & Desyrel 100mg /d; she is under a lot of stress still...   Patient's Medications  New Prescriptions   No medications on file  Previous Medications   ALPRAZOLAM (XANAX) 0.5 MG TABLET    Take 1/2 to 1 tablet by mouth three times daily as directed   ASPIRIN 81 MG TABLET    Take 81 mg by mouth daily.     CALCIUM CARBONATE (CALCIUM 600) 600 MG TABS    Take 4 tablets by mouth daily    CETIRIZINE (ZYRTEC) 10 MG TABLET    Take 10 mg by mouth daily.     CYCLOBENZAPRINE (FLEXERIL) 10 MG TABLET    Take 1 tablet (10 mg total) by mouth 3 (three) times daily as needed.   DIPHENHYD-HYDROCORT-NYSTATIN (FIRST-DUKES MOUTHWASH) SUSP    Gargle and swallow 1 teaspoon four times a day   EPINEPHRINE (EPI-PEN) 0.3 MG/0.3 ML DEVI    Inject 0.3 mg into the muscle as needed.   FISH OIL-OMEGA-3 FATTY ACIDS 1000 MG CAPSULE    Take 1 g by mouth daily.     MULTIPLE VITAMINS-MINERALS (PROTEGRA PO)    Take by mouth daily.     MULTIVITAMIN (THERAGRAN) PER TABLET    Take 1 tablet by mouth daily.     NONFORMULARY OR COMPOUNDED ITEM    Estradiol .02% 1 ML Prefilled Applicator Sig: apply vaginally twice a week #90 Day Supply with 4 refills   UNABLE TO FIND    Allergy shots weekly  Modified Medications   Modified Medication Previous Medication   AMLODIPINE (NORVASC) 2.5 MG TABLET amLODipine (NORVASC) 2.5 MG tablet      TAKE ONE TABLET BY MOUTH ONE TIME DAILY    TAKE ONE TABLET BY MOUTH ONE TIME DAILY    CLIDINIUM-CHLORDIAZEPOXIDE (LIBRAX) 5-2.5 MG PER CAPSULE clidinium-chlordiazePOXIDE (LIBRAX) 2.5-5  MG per capsule      Take 1 capsule by mouth 3 (three) times daily as needed. For abd cramping    Take 1 capsule by mouth 3 (three) times daily as needed. For abd cramping   NITROGLYCERIN (NITROSTAT) 0.4 MG SL TABLET nitroGLYCERIN (NITROSTAT) 0.4 MG SL tablet      Place 1 tablet (0.4 mg total) under the tongue every 5 (five) minutes as needed. May repeat 3 times    Place 1 tablet (0.4 mg total) under the tongue every 5 (five) minutes as needed. May repeat 3 times   TRAZODONE (DESYREL) 100  MG TABLET traZODone (DESYREL) 100 MG tablet      Take one tablet by mouth   nightly at bedtime    Take one tablet by mouth   nightly at bedtime  Discontinued Medications   AZITHROMYCIN (ZITHROMAX) 250 MG TABLET    Take as directed   CHLORPHENIRAMINE-HYDROCODONE (TUSSIONEX PENNKINETIC ER) 10-8 MG/5ML LQCR    Take 5 mLs by mouth every 12 (twelve) hours as needed for cough.   METHYLPREDNISOLONE (MEDROL) 4 MG TABLET    As directed, 6 day pak

## 2013-07-31 DIAGNOSIS — J309 Allergic rhinitis, unspecified: Secondary | ICD-10-CM | POA: Diagnosis not present

## 2013-08-07 DIAGNOSIS — J309 Allergic rhinitis, unspecified: Secondary | ICD-10-CM | POA: Diagnosis not present

## 2013-08-14 DIAGNOSIS — J309 Allergic rhinitis, unspecified: Secondary | ICD-10-CM | POA: Diagnosis not present

## 2013-08-21 DIAGNOSIS — J309 Allergic rhinitis, unspecified: Secondary | ICD-10-CM | POA: Diagnosis not present

## 2013-08-24 ENCOUNTER — Other Ambulatory Visit: Payer: Self-pay | Admitting: Pulmonary Disease

## 2013-08-27 DIAGNOSIS — H40059 Ocular hypertension, unspecified eye: Secondary | ICD-10-CM | POA: Diagnosis not present

## 2013-08-28 DIAGNOSIS — J309 Allergic rhinitis, unspecified: Secondary | ICD-10-CM | POA: Diagnosis not present

## 2013-09-04 DIAGNOSIS — J309 Allergic rhinitis, unspecified: Secondary | ICD-10-CM | POA: Diagnosis not present

## 2013-09-11 DIAGNOSIS — J309 Allergic rhinitis, unspecified: Secondary | ICD-10-CM | POA: Diagnosis not present

## 2013-09-18 DIAGNOSIS — J309 Allergic rhinitis, unspecified: Secondary | ICD-10-CM | POA: Diagnosis not present

## 2013-09-25 DIAGNOSIS — J309 Allergic rhinitis, unspecified: Secondary | ICD-10-CM | POA: Diagnosis not present

## 2013-10-01 DIAGNOSIS — J309 Allergic rhinitis, unspecified: Secondary | ICD-10-CM | POA: Diagnosis not present

## 2013-10-06 DIAGNOSIS — L219 Seborrheic dermatitis, unspecified: Secondary | ICD-10-CM | POA: Diagnosis not present

## 2013-10-06 DIAGNOSIS — Q828 Other specified congenital malformations of skin: Secondary | ICD-10-CM | POA: Diagnosis not present

## 2013-10-06 DIAGNOSIS — I8390 Asymptomatic varicose veins of unspecified lower extremity: Secondary | ICD-10-CM | POA: Diagnosis not present

## 2013-10-06 DIAGNOSIS — L821 Other seborrheic keratosis: Secondary | ICD-10-CM | POA: Diagnosis not present

## 2013-10-06 DIAGNOSIS — L570 Actinic keratosis: Secondary | ICD-10-CM | POA: Diagnosis not present

## 2013-10-09 DIAGNOSIS — J309 Allergic rhinitis, unspecified: Secondary | ICD-10-CM | POA: Diagnosis not present

## 2013-10-16 DIAGNOSIS — J309 Allergic rhinitis, unspecified: Secondary | ICD-10-CM | POA: Diagnosis not present

## 2013-10-23 DIAGNOSIS — J309 Allergic rhinitis, unspecified: Secondary | ICD-10-CM | POA: Diagnosis not present

## 2013-11-06 DIAGNOSIS — J309 Allergic rhinitis, unspecified: Secondary | ICD-10-CM | POA: Diagnosis not present

## 2013-11-12 DIAGNOSIS — H109 Unspecified conjunctivitis: Secondary | ICD-10-CM | POA: Diagnosis not present

## 2013-11-13 DIAGNOSIS — J309 Allergic rhinitis, unspecified: Secondary | ICD-10-CM | POA: Diagnosis not present

## 2013-11-14 ENCOUNTER — Ambulatory Visit: Payer: Medicare Other | Admitting: Cardiology

## 2013-11-19 DIAGNOSIS — H1044 Vernal conjunctivitis: Secondary | ICD-10-CM | POA: Diagnosis not present

## 2013-11-20 DIAGNOSIS — J309 Allergic rhinitis, unspecified: Secondary | ICD-10-CM | POA: Diagnosis not present

## 2013-11-27 DIAGNOSIS — J309 Allergic rhinitis, unspecified: Secondary | ICD-10-CM | POA: Diagnosis not present

## 2013-12-04 DIAGNOSIS — J309 Allergic rhinitis, unspecified: Secondary | ICD-10-CM | POA: Diagnosis not present

## 2013-12-10 DIAGNOSIS — J309 Allergic rhinitis, unspecified: Secondary | ICD-10-CM | POA: Diagnosis not present

## 2013-12-11 DIAGNOSIS — J309 Allergic rhinitis, unspecified: Secondary | ICD-10-CM | POA: Diagnosis not present

## 2013-12-15 ENCOUNTER — Encounter: Payer: Self-pay | Admitting: Cardiology

## 2013-12-15 ENCOUNTER — Ambulatory Visit (INDEPENDENT_AMBULATORY_CARE_PROVIDER_SITE_OTHER): Payer: Medicare Other | Admitting: Cardiology

## 2013-12-15 VITALS — BP 132/80 | HR 82 | Ht 63.0 in | Wt 103.0 lb

## 2013-12-15 DIAGNOSIS — K5520 Angiodysplasia of colon without hemorrhage: Secondary | ICD-10-CM | POA: Diagnosis not present

## 2013-12-15 DIAGNOSIS — I10 Essential (primary) hypertension: Secondary | ICD-10-CM | POA: Diagnosis not present

## 2013-12-15 DIAGNOSIS — I2510 Atherosclerotic heart disease of native coronary artery without angina pectoris: Secondary | ICD-10-CM

## 2013-12-15 DIAGNOSIS — I059 Rheumatic mitral valve disease, unspecified: Secondary | ICD-10-CM | POA: Diagnosis not present

## 2013-12-15 DIAGNOSIS — I341 Nonrheumatic mitral (valve) prolapse: Secondary | ICD-10-CM

## 2013-12-15 NOTE — Progress Notes (Signed)
HPI The patient presents for followup of hypertension and palpitations. 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   She denies any chest pressure, neck or arm discomfort. She is not having any new palpitations and rarely feels these. She's had no presyncope or syncope. She does her yardwork and goes to the gym about once per week . She has no new shortness of breath, PND or orthopnea. She's had no weight gain or edema.    Allergies  Allergen Reactions  . Codeine     REACTION: NAUSEA  . Erythromycin     REACTION: NAUSEA  . Other     Environmental allergies    Current Outpatient Prescriptions  Medication Sig Dispense Refill  . ALPRAZolam (XANAX) 0.5 MG tablet take 1/2 to 1 tablet by mouth 3 times a day as directed  90 tablet  2  . amLODipine (NORVASC) 2.5 MG tablet TAKE ONE TABLET BY MOUTH ONE TIME DAILY  30 tablet  6  . aspirin 81 MG tablet Take 81 mg by mouth daily.        . calcium carbonate (CALCIUM 600) 600 MG TABS Take 4 tablets by mouth daily       . cetirizine (ZYRTEC) 10 MG tablet Take 10 mg by mouth daily.        . clidinium-chlordiazePOXIDE (LIBRAX) 5-2.5 MG per capsule Take 1 capsule by mouth 3 (three) times daily as needed. For abd cramping  90 capsule  3  . cyclobenzaprine (FLEXERIL) 10 MG tablet Take 1 tablet (10 mg total) by mouth 3 (three) times daily as needed.  10 tablet  5  . EPINEPHrine (EPI-PEN) 0.3 mg/0.3 mL DEVI Inject 0.3 mg into the muscle as needed.      . fish oil-omega-3 fatty acids 1000 MG capsule Take 1 g by mouth daily.        . Multiple Vitamins-Minerals (PROTEGRA PO) Take by mouth daily.        . multivitamin (THERAGRAN) per tablet Take 1 tablet by mouth daily.        . nitroGLYCERIN (NITROSTAT) 0.4 MG SL tablet Place 1 tablet (0.4 mg total) under the tongue every 5 (five) minutes as needed. May repeat 3 times  25 tablet  6  . NONFORMULARY OR  COMPOUNDED ITEM Estradiol .02% 1 ML Prefilled Applicator Sig: apply vaginally twice a week #90 Day Supply with 4 refills  1 each  3  . traZODone (DESYREL) 100 MG tablet Take one tablet by mouth   nightly at bedtime  90 tablet  3  . UNABLE TO FIND Allergy shots weekly       No current facility-administered medications for this visit.    Past Medical History  Diagnosis Date  . Palpitations   . History of sinusitis   . History of pneumonia   . Diverticulosis of colon   . Hx of colonic polyps   . Lumbar back pain   . Fibromyalgia   . Anxiety   . MVP (mitral valve prolapse)     No antibiotics required for procedures  . Osteoporosis   . Coronary artery spasm     Non obstructive CAD    Past Surgical History  Procedure Laterality Date  . Cholecystectomy  03/2003  . Tonsillectomy      as a child  . Lumbar laminectomy      ROS:  As stated in the  HPI and negative for all other systems.  PHYSICAL EXAM BP 132/80  Pulse 82  Ht 5\' 3"  (1.6 m)  Wt 103 lb (46.72 kg)  BMI 18.25 kg/m2 GENERAL:  Well appearing NECK:  No jugular venous distention, waveform within normal limits, carotid upstroke brisk and symmetric, no bruits, no thyromegaly LUNGS:  Clear to auscultation bilaterally CHEST:  Unremarkable HEART:  PMI not displaced or sustained,S1 and S2 within normal limits, no S3, no S4, no clicks, no rubs, no murmurs ABD:  Flat, positive bowel sounds normal in frequency in pitch, no bruits, no rebound, no guarding, no midline pulsatile mass, no hepatomegaly, no splenomegaly EXT:  2 plus pulses throughout, no edema, no cyanosis no clubbing  EKG: Normal sinus rhythm, rate 82, left axis, LAFB, poor anterior R-wave progression, no acute ST-T wave changes. No change from previous.  12/15/2013  ASSESSMENT AND PLAN  PALPITATIONS:  The patient has stable pattern of palpitations. She will continue on the meds as listed.  MVP:  I would not suspect any mitral regurgitation. No further imaging  is indicated.

## 2013-12-15 NOTE — Patient Instructions (Signed)
Your physician recommends that you schedule a follow-up appointment in:  One year with Dr. Hochrein  

## 2013-12-18 DIAGNOSIS — J309 Allergic rhinitis, unspecified: Secondary | ICD-10-CM | POA: Diagnosis not present

## 2013-12-25 DIAGNOSIS — J309 Allergic rhinitis, unspecified: Secondary | ICD-10-CM | POA: Diagnosis not present

## 2014-01-01 DIAGNOSIS — J309 Allergic rhinitis, unspecified: Secondary | ICD-10-CM | POA: Diagnosis not present

## 2014-01-08 DIAGNOSIS — J309 Allergic rhinitis, unspecified: Secondary | ICD-10-CM | POA: Diagnosis not present

## 2014-01-15 DIAGNOSIS — J309 Allergic rhinitis, unspecified: Secondary | ICD-10-CM | POA: Diagnosis not present

## 2014-01-21 DIAGNOSIS — H1045 Other chronic allergic conjunctivitis: Secondary | ICD-10-CM | POA: Diagnosis not present

## 2014-01-21 DIAGNOSIS — J3081 Allergic rhinitis due to animal (cat) (dog) hair and dander: Secondary | ICD-10-CM | POA: Diagnosis not present

## 2014-01-21 DIAGNOSIS — J309 Allergic rhinitis, unspecified: Secondary | ICD-10-CM | POA: Diagnosis not present

## 2014-01-21 DIAGNOSIS — J3089 Other allergic rhinitis: Secondary | ICD-10-CM | POA: Diagnosis not present

## 2014-01-21 DIAGNOSIS — J301 Allergic rhinitis due to pollen: Secondary | ICD-10-CM | POA: Diagnosis not present

## 2014-01-22 DIAGNOSIS — H268 Other specified cataract: Secondary | ICD-10-CM | POA: Diagnosis not present

## 2014-02-05 DIAGNOSIS — J301 Allergic rhinitis due to pollen: Secondary | ICD-10-CM | POA: Diagnosis not present

## 2014-02-05 DIAGNOSIS — J3089 Other allergic rhinitis: Secondary | ICD-10-CM | POA: Diagnosis not present

## 2014-02-13 DIAGNOSIS — J301 Allergic rhinitis due to pollen: Secondary | ICD-10-CM | POA: Diagnosis not present

## 2014-02-13 DIAGNOSIS — J3089 Other allergic rhinitis: Secondary | ICD-10-CM | POA: Diagnosis not present

## 2014-02-19 DIAGNOSIS — J3089 Other allergic rhinitis: Secondary | ICD-10-CM | POA: Diagnosis not present

## 2014-02-19 DIAGNOSIS — J301 Allergic rhinitis due to pollen: Secondary | ICD-10-CM | POA: Diagnosis not present

## 2014-02-26 DIAGNOSIS — J301 Allergic rhinitis due to pollen: Secondary | ICD-10-CM | POA: Diagnosis not present

## 2014-02-26 DIAGNOSIS — J3089 Other allergic rhinitis: Secondary | ICD-10-CM | POA: Diagnosis not present

## 2014-03-02 ENCOUNTER — Ambulatory Visit: Payer: Medicare Other | Admitting: Internal Medicine

## 2014-03-02 ENCOUNTER — Encounter: Payer: Self-pay | Admitting: Cardiology

## 2014-03-04 ENCOUNTER — Telehealth: Payer: Self-pay | Admitting: Pulmonary Disease

## 2014-03-04 DIAGNOSIS — J301 Allergic rhinitis due to pollen: Secondary | ICD-10-CM | POA: Diagnosis not present

## 2014-03-04 DIAGNOSIS — J3089 Other allergic rhinitis: Secondary | ICD-10-CM | POA: Diagnosis not present

## 2014-03-04 MED ORDER — ALPRAZOLAM 0.5 MG PO TABS: ORAL_TABLET | ORAL | Status: DC

## 2014-03-04 MED ORDER — AMLODIPINE BESYLATE 2.5 MG PO TABS: ORAL_TABLET | ORAL | Status: DC

## 2014-03-04 NOTE — Telephone Encounter (Signed)
Called pt. She is not due to see new PCP until end of month and is out of norvasc and xanax. Needs this refilled. I have [facility] this [facility]. Nothing further needed

## 2014-03-04 NOTE — Telephone Encounter (Signed)
Spoke with [facility]. They did receive RX but received message this will not be covered by insurance until tomorrow.   Called pt and made aware. Nothing further needed

## 2014-03-12 DIAGNOSIS — J3089 Other allergic rhinitis: Secondary | ICD-10-CM | POA: Diagnosis not present

## 2014-03-12 DIAGNOSIS — J301 Allergic rhinitis due to pollen: Secondary | ICD-10-CM | POA: Diagnosis not present

## 2014-03-19 DIAGNOSIS — J3089 Other allergic rhinitis: Secondary | ICD-10-CM | POA: Diagnosis not present

## 2014-03-19 DIAGNOSIS — J301 Allergic rhinitis due to pollen: Secondary | ICD-10-CM | POA: Diagnosis not present

## 2014-03-24 DIAGNOSIS — J301 Allergic rhinitis due to pollen: Secondary | ICD-10-CM | POA: Diagnosis not present

## 2014-03-30 ENCOUNTER — Ambulatory Visit: Payer: Medicare Other | Admitting: Internal Medicine

## 2014-04-02 DIAGNOSIS — J301 Allergic rhinitis due to pollen: Secondary | ICD-10-CM | POA: Diagnosis not present

## 2014-04-02 DIAGNOSIS — J3089 Other allergic rhinitis: Secondary | ICD-10-CM | POA: Diagnosis not present

## 2014-04-08 ENCOUNTER — Other Ambulatory Visit: Payer: Self-pay | Admitting: Pulmonary Disease

## 2014-04-09 DIAGNOSIS — J3089 Other allergic rhinitis: Secondary | ICD-10-CM | POA: Diagnosis not present

## 2014-04-09 DIAGNOSIS — J301 Allergic rhinitis due to pollen: Secondary | ICD-10-CM | POA: Diagnosis not present

## 2014-04-13 DIAGNOSIS — L821 Other seborrheic keratosis: Secondary | ICD-10-CM | POA: Diagnosis not present

## 2014-04-13 DIAGNOSIS — L218 Other seborrheic dermatitis: Secondary | ICD-10-CM | POA: Diagnosis not present

## 2014-04-13 DIAGNOSIS — L814 Other melanin hyperpigmentation: Secondary | ICD-10-CM | POA: Diagnosis not present

## 2014-04-13 DIAGNOSIS — D225 Melanocytic nevi of trunk: Secondary | ICD-10-CM | POA: Diagnosis not present

## 2014-04-15 DIAGNOSIS — J301 Allergic rhinitis due to pollen: Secondary | ICD-10-CM | POA: Diagnosis not present

## 2014-04-15 DIAGNOSIS — J3089 Other allergic rhinitis: Secondary | ICD-10-CM | POA: Diagnosis not present

## 2014-04-17 ENCOUNTER — Encounter: Payer: Self-pay | Admitting: Internal Medicine

## 2014-04-17 ENCOUNTER — Ambulatory Visit (INDEPENDENT_AMBULATORY_CARE_PROVIDER_SITE_OTHER): Payer: Medicare Other | Admitting: Internal Medicine

## 2014-04-17 VITALS — BP 142/82 | HR 90 | Temp 97.5°F | Resp 20 | Ht 64.0 in | Wt 107.6 lb

## 2014-04-17 DIAGNOSIS — I2510 Atherosclerotic heart disease of native coronary artery without angina pectoris: Secondary | ICD-10-CM | POA: Diagnosis not present

## 2014-04-17 DIAGNOSIS — J302 Other seasonal allergic rhinitis: Secondary | ICD-10-CM | POA: Diagnosis not present

## 2014-04-17 DIAGNOSIS — F411 Generalized anxiety disorder: Secondary | ICD-10-CM | POA: Diagnosis not present

## 2014-04-17 DIAGNOSIS — Z23 Encounter for immunization: Secondary | ICD-10-CM | POA: Diagnosis not present

## 2014-04-17 DIAGNOSIS — M797 Fibromyalgia: Secondary | ICD-10-CM

## 2014-04-17 DIAGNOSIS — Z299 Encounter for prophylactic measures, unspecified: Secondary | ICD-10-CM

## 2014-04-17 DIAGNOSIS — Z418 Encounter for other procedures for purposes other than remedying health state: Secondary | ICD-10-CM | POA: Diagnosis not present

## 2014-04-17 DIAGNOSIS — K589 Irritable bowel syndrome without diarrhea: Secondary | ICD-10-CM

## 2014-04-17 MED ORDER — TRAZODONE HCL 100 MG PO TABS: ORAL_TABLET | ORAL | Status: DC

## 2014-04-17 MED ORDER — AMLODIPINE BESYLATE 2.5 MG PO TABS: 2.5000 mg | ORAL_TABLET | Freq: Every day | ORAL | Status: DC

## 2014-04-17 NOTE — Assessment & Plan Note (Signed)
Gets allergy shots and just started taking flonase yesterday for some congestion. No indication for antibiotics.

## 2014-04-17 NOTE — Assessment & Plan Note (Signed)
Has librax for cramps and states she does not need it except 1-2 times per month.

## 2014-04-17 NOTE — Assessment & Plan Note (Signed)
Takes xanax and no more than once daily.

## 2014-04-17 NOTE — Progress Notes (Signed)
   Subjective:    Patient ID: [name] [name], female    DOB: 17-Jun-1935, 78 y.o.   MRN: [id number]  HPI The patient is a 78 YO female who comes in to establish care with PMH of allergies (gets shots), back pain, fibromyalgia, IBS, osteoporosis (on fosamax holiday). She is having nasal congestion the last day and started taking her flonase and using neti pot. She does do allergy shots weekly for many years. She does fairly well overall and has several medications on her list which she uses only as needed (once per month or less). Her fibromyalgia is good overall and flares up about 2-3 times yearly and she uses flexeril. She denies chest pains, SOB. She does well with her IBS mostly and no pains. Her joints are doing well at this time and she does not need a daily medicine although she does have some stiffness and pain rarely in her left thumb.  Review of Systems  Constitutional: Negative for fever, activity change, appetite change, fatigue and unexpected weight change.  HENT: Positive for congestion and rhinorrhea. Negative for ear discharge, ear pain, postnasal drip, sinus pressure, sore throat and voice change.   Respiratory: Negative for cough, chest tightness, shortness of breath and wheezing.   Cardiovascular: Negative for chest pain, palpitations and leg swelling.  Gastrointestinal: Negative for abdominal pain, diarrhea, constipation, blood in stool and abdominal distention.  Musculoskeletal: Positive for myalgias and arthralgias. Negative for gait problem.       Rare  Skin: Negative.   Neurological: Negative for dizziness, weakness, light-headedness and headaches.  Psychiatric/Behavioral: Negative.       Objective:   Physical Exam  Constitutional: She is oriented to person, place, and time. She appears well-developed and well-nourished.  HENT:  Head: Normocephalic and atraumatic.  Nose with some erythema, throat clear. Ears with normal TMs.  Eyes: EOM are normal.  Neck: Normal  range of motion.  Cardiovascular: Normal rate and regular rhythm.   Pulmonary/Chest: Effort normal and breath sounds normal. No respiratory distress. She has no wheezes. She has no rales.  Abdominal: Soft. Bowel sounds are normal. She exhibits no distension. There is no tenderness. There is no rebound.  Neurological: She is alert and oriented to person, place, and time. Coordination normal.  Skin: Skin is warm and dry.   Filed Vitals:   04/17/14 1520  BP: 142/82  Pulse: 90  Temp: 97.5 F (36.4 C)  TempSrc: Oral  Resp: 20  Height: 5\' 4"  (1.626 m)  Weight: 107 lb 9.6 oz (48.807 kg)  SpO2: 99%      Assessment & Plan:

## 2014-04-17 NOTE — Patient Instructions (Signed)
We will send in your refills and have you come back in March or April for your physical and we will do your blood work then.   Keep taking the flonase and if the allergies get worse please feel free to call our office back.

## 2014-04-17 NOTE — Assessment & Plan Note (Signed)
Takes flexeril for pain control and needs it only 1-2 times monthly. Stable at this time.

## 2014-04-17 NOTE — Progress Notes (Signed)
Pre visit review using our clinic review tool, if applicable. No additional management support is needed unless otherwise documented below in the visit note. 

## 2014-04-30 DIAGNOSIS — J3089 Other allergic rhinitis: Secondary | ICD-10-CM | POA: Diagnosis not present

## 2014-04-30 DIAGNOSIS — J301 Allergic rhinitis due to pollen: Secondary | ICD-10-CM | POA: Diagnosis not present

## 2014-05-03 ENCOUNTER — Other Ambulatory Visit: Payer: Self-pay | Admitting: Pulmonary Disease

## 2014-05-04 NOTE — Telephone Encounter (Signed)
This will need to be refilled by her PCP Dr. Doug Sou.  thanks

## 2014-05-04 NOTE — Telephone Encounter (Signed)
Refill request for Xanax.  Last refill: 03/04/14; Last OV: 07/28/13; no OV scheduled.  Ok to refill?

## 2014-05-05 ENCOUNTER — Other Ambulatory Visit: Payer: Self-pay | Admitting: Geriatric Medicine

## 2014-05-05 ENCOUNTER — Telehealth: Payer: Self-pay | Admitting: Internal Medicine

## 2014-05-05 MED ORDER — ALPRAZOLAM 0.5 MG PO TABS: ORAL_TABLET | ORAL | Status: DC

## 2014-05-05 NOTE — Addendum Note (Signed)
Addended by: Elie Confer on: 05/05/2014 03:47 PM   Modules accepted: Orders

## 2014-05-05 NOTE — Telephone Encounter (Signed)
Spoke to [facility]. Rx has been filled.

## 2014-05-05 NOTE — Telephone Encounter (Signed)
Pt called in,needs refill on her Xanax.  Pharmacy called it into [facility] on the 3rd in error.

## 2014-05-05 NOTE — Telephone Encounter (Signed)
Spoke with pt and she is aware that i will call the rx in today.  Further refills will come from Dr. Doug Sou since the pt has established care with her.  Pt stated that she did have a call into them to have this medication refilled. Nothing further is needed.

## 2014-05-06 ENCOUNTER — Other Ambulatory Visit: Payer: Self-pay | Admitting: Geriatric Medicine

## 2014-05-25 ENCOUNTER — Other Ambulatory Visit: Payer: Self-pay | Admitting: Pulmonary Disease

## 2014-05-26 DIAGNOSIS — Z1231 Encounter for screening mammogram for malignant neoplasm of breast: Secondary | ICD-10-CM | POA: Diagnosis not present

## 2014-05-27 ENCOUNTER — Other Ambulatory Visit: Payer: Self-pay | Admitting: Internal Medicine

## 2014-05-28 ENCOUNTER — Encounter: Payer: Self-pay | Admitting: Gynecology

## 2014-05-28 DIAGNOSIS — J3089 Other allergic rhinitis: Secondary | ICD-10-CM | POA: Diagnosis not present

## 2014-05-28 DIAGNOSIS — J301 Allergic rhinitis due to pollen: Secondary | ICD-10-CM | POA: Diagnosis not present

## 2014-06-03 ENCOUNTER — Telehealth: Payer: Self-pay | Admitting: Internal Medicine

## 2014-06-03 ENCOUNTER — Telehealth: Payer: Self-pay | Admitting: Geriatric Medicine

## 2014-06-03 NOTE — Telephone Encounter (Signed)
Pt called in about her xanex refill today.  She was very upset because it has not been refilled yet.  I explain to her that it was filled on teh 5th of last month and was not due yet.  She did not care and said that it should be at the pharmacy when she is ready for it.  She said " it better be sent tomorrow and this better not keep happening"

## 2014-06-04 ENCOUNTER — Other Ambulatory Visit: Payer: Self-pay | Admitting: Geriatric Medicine

## 2014-06-04 DIAGNOSIS — J3089 Other allergic rhinitis: Secondary | ICD-10-CM | POA: Diagnosis not present

## 2014-06-04 DIAGNOSIS — J301 Allergic rhinitis due to pollen: Secondary | ICD-10-CM | POA: Diagnosis not present

## 2014-06-04 MED ORDER — ALPRAZOLAM 0.5 MG PO TABS: ORAL_TABLET | ORAL | Status: DC

## 2014-06-04 NOTE — Telephone Encounter (Signed)
Patient aware that xanax has been sent in

## 2014-06-04 NOTE — Telephone Encounter (Signed)
Refill once for 90 tabs until patient comes in March. Dr. Doug Sou will re-evaluate dosage at that time.

## 2014-06-11 DIAGNOSIS — J301 Allergic rhinitis due to pollen: Secondary | ICD-10-CM | POA: Diagnosis not present

## 2014-06-11 DIAGNOSIS — J3089 Other allergic rhinitis: Secondary | ICD-10-CM | POA: Diagnosis not present

## 2014-06-16 ENCOUNTER — Encounter: Payer: Self-pay | Admitting: Internal Medicine

## 2014-06-18 DIAGNOSIS — J3089 Other allergic rhinitis: Secondary | ICD-10-CM | POA: Diagnosis not present

## 2014-06-18 DIAGNOSIS — J301 Allergic rhinitis due to pollen: Secondary | ICD-10-CM | POA: Diagnosis not present

## 2014-07-03 ENCOUNTER — Ambulatory Visit (INDEPENDENT_AMBULATORY_CARE_PROVIDER_SITE_OTHER)
Admission: RE | Admit: 2014-07-03 | Discharge: 2014-07-03 | Disposition: A | Discharge status: Home / Self Care | Payer: Medicare Other | Source: Ambulatory Visit | Attending: Internal Medicine | Admitting: Internal Medicine

## 2014-07-03 ENCOUNTER — Ambulatory Visit (INDEPENDENT_AMBULATORY_CARE_PROVIDER_SITE_OTHER): Payer: Medicare Other | Admitting: Internal Medicine

## 2014-07-03 ENCOUNTER — Encounter: Payer: Self-pay | Admitting: Internal Medicine

## 2014-07-03 VITALS — BP 120/80 | HR 101 | Temp 98.2°F | Resp 16 | Wt 108.0 lb

## 2014-07-03 DIAGNOSIS — R059 Cough, unspecified: Secondary | ICD-10-CM

## 2014-07-03 DIAGNOSIS — R05 Cough: Secondary | ICD-10-CM

## 2014-07-03 DIAGNOSIS — R079 Chest pain, unspecified: Secondary | ICD-10-CM | POA: Diagnosis not present

## 2014-07-03 DIAGNOSIS — J209 Acute bronchitis, unspecified: Secondary | ICD-10-CM | POA: Insufficient documentation

## 2014-07-03 DIAGNOSIS — R0602 Shortness of breath: Secondary | ICD-10-CM | POA: Diagnosis not present

## 2014-07-03 MED ORDER — HYDROCOD POLST-CHLORPHEN POLST 10-8 MG/5ML PO LQCR
5.0000 mL | Freq: Two times a day (BID) | ORAL | Status: DC | PRN
Start: 2014-07-03 — End: 2014-08-11

## 2014-07-03 MED ORDER — AZITHROMYCIN 250 MG PO TABS: ORAL_TABLET | ORAL | Status: DC

## 2014-07-03 MED ORDER — PREDNISONE 10 MG PO TABS: 10.0000 mg | ORAL_TABLET | Freq: Every day | ORAL | Status: DC

## 2014-07-03 MED ORDER — MAGIC MOUTHWASH: 5.0000 mL | Freq: Three times a day (TID) | ORAL | Status: DC | PRN

## 2014-07-03 NOTE — Progress Notes (Signed)
Pre visit review using our clinic review tool, if applicable. No additional management support is needed unless otherwise documented below in the visit note. 

## 2014-07-03 NOTE — Progress Notes (Signed)
   Subjective:    Patient ID: [name] [name], female    DOB: November 27, 1935, 79 y.o.   MRN: [id number]  HPI The patient is a 79 YO female who is coming in today for cough and cold symptoms. They started 2 weeks ago. Denies fevers or chills. She has congestion, drainage, cough. Denies SOB. Denies ear pain or drainage. Has tried mucinex without much relief. Has also been drinking hot tea with honey and lemon. Her ribs hurt from coughing so much and she has been awakened at night time with coughing fits.    Review of Systems  Constitutional: Positive for activity change. Negative for fever, appetite change, fatigue and unexpected weight change.  HENT: Positive for congestion, postnasal drip, rhinorrhea, sinus pressure and sore throat. Negative for ear discharge, ear pain and trouble swallowing.   Respiratory: Positive for cough and chest tightness. Negative for shortness of breath.   Gastrointestinal: Negative.   Musculoskeletal: Positive for myalgias.      Objective:   Physical Exam  Constitutional: She appears well-developed and well-nourished.  HENT:  Head: Normocephalic and atraumatic.  Nose with erythema and mild crusting.   Eyes: EOM are normal.  Neck: Normal range of motion.  Cardiovascular: Normal rate and regular rhythm.   Pulmonary/Chest: Effort normal.  Mild wheeze throughout  Abdominal: Soft.    Filed Vitals:   07/03/14 1618  BP: 120/80  Pulse: 101  Temp: 98.2 F (36.8 C)  TempSrc: Oral  Resp: 16  Weight: 108 lb (48.988 kg)  SpO2: 97%      Assessment & Plan:

## 2014-07-03 NOTE — Assessment & Plan Note (Signed)
Depo-medrol 40 mg given at visit. Z-pack and magic mouthwash (per her she always has problems after depo-medrol). Prednisone low dose for 5 days. Tussionex for cough. Call back in 1-2 weeks if not better.

## 2014-07-03 NOTE — Patient Instructions (Signed)
We will check the chest x-ray today. We have given you the depo-medrol today. We have also sent in prednisone and azithromycin.  With the prednisone take 1 pill a day for 5 days.   With the azithromycin take 2 pills today, then 1 pill a day until it is gone.   If you are not feeling better in 1-2 weeks please call us back. We have also given you the tussionex for the cough.  Reschedule the physical for the beginning of April so you have time to feel better.

## 2014-07-09 ENCOUNTER — Other Ambulatory Visit: Payer: Self-pay | Admitting: Geriatric Medicine

## 2014-07-09 MED ORDER — AMLODIPINE BESYLATE 2.5 MG PO TABS: 2.5000 mg | ORAL_TABLET | Freq: Every day | ORAL | Status: DC

## 2014-07-13 ENCOUNTER — Telehealth: Payer: Self-pay | Admitting: Internal Medicine

## 2014-07-13 ENCOUNTER — Ambulatory Visit: Payer: Medicare Other | Admitting: Internal Medicine

## 2014-07-13 NOTE — Telephone Encounter (Signed)
Spoke with patient and she is going to schedule an office visit.

## 2014-07-13 NOTE — Telephone Encounter (Signed)
Pt called in and said that she is still wheezing and is not feeling any better.  She wanted to know what she should do?    Best number [phone]

## 2014-07-13 NOTE — Telephone Encounter (Signed)
Should we have her come back in for an office visit?

## 2014-07-13 NOTE — Telephone Encounter (Signed)
Called and spoke with [name] [name] and clarified.

## 2014-07-13 NOTE — Telephone Encounter (Signed)
She can certainly come back for a visit if she wants.

## 2014-07-13 NOTE — Telephone Encounter (Signed)
[name] [name] is asking for the specific dosage of magic mouth wash since it is a compound. Please clarify

## 2014-07-14 ENCOUNTER — Encounter: Payer: Self-pay | Admitting: Internal Medicine

## 2014-07-14 ENCOUNTER — Ambulatory Visit (INDEPENDENT_AMBULATORY_CARE_PROVIDER_SITE_OTHER): Payer: Medicare Other | Admitting: Internal Medicine

## 2014-07-14 VITALS — BP 140/80 | HR 73 | Temp 97.4°F | Resp 18 | Wt 106.0 lb

## 2014-07-14 DIAGNOSIS — J302 Other seasonal allergic rhinitis: Secondary | ICD-10-CM | POA: Diagnosis not present

## 2014-07-14 DIAGNOSIS — J209 Acute bronchitis, unspecified: Secondary | ICD-10-CM | POA: Diagnosis not present

## 2014-07-14 DIAGNOSIS — H2513 Age-related nuclear cataract, bilateral: Secondary | ICD-10-CM | POA: Diagnosis not present

## 2014-07-14 NOTE — Assessment & Plan Note (Signed)
Advised to go back to allergy shots, likely from all the blooming outside. Continue zyrtec and add flonase (which she has at home) for the allergies. Some coughing okay.

## 2014-07-14 NOTE — Assessment & Plan Note (Signed)
At this point resolved and she is dealing more with allergic rhinitis. Advised she can go back on allergy shots, continue zyrtec, can use flonase she has at home. Reassured that cough can often linger for several weeks and is not a sign of lung infection.

## 2014-07-14 NOTE — Progress Notes (Signed)
Pre visit review using our clinic review tool, if applicable. No additional management support is needed unless otherwise documented below in the visit note. 

## 2014-07-14 NOTE — Progress Notes (Signed)
   Subjective:    Patient ID: [name] [name], female    DOB: 19-Aug-1935, 79 y.o.   MRN: [id number]  HPI The patient is a 79 YO female who is coming in today to follow up on her bronchitis. She finished antibiotics and steroids and is feeling better but still having some cough. She denies fevers or chills. She is still off her allergy injections and is having some drainage and sinus blockage. Her ears are doing better but still some cough and post-nasal drip. Not needing the cough syrup at night time anymore and able to sleep okay.   Review of Systems  Constitutional: Negative for fever, appetite change, fatigue and unexpected weight change.  HENT: Positive for congestion, postnasal drip and rhinorrhea. Negative for ear discharge, ear pain and trouble swallowing.   Respiratory: Positive for cough. Negative for shortness of breath.   Cardiovascular: Negative.   Gastrointestinal: Negative.       Objective:   Physical Exam  Constitutional: She appears well-developed and well-nourished.  HENT:  Head: Normocephalic and atraumatic.  Right Ear: External ear normal.  Left Ear: External ear normal.  Nose with erythema  Eyes: EOM are normal.  Neck: Normal range of motion.  Cardiovascular: Normal rate and regular rhythm.   Pulmonary/Chest: Effort normal.  No wheezing, normal exam  Abdominal: Soft.   Filed Vitals:   07/14/14 0837  BP: 140/80  Pulse: 73  Temp: 97.4 F (36.3 C)  TempSrc: Oral  Resp: 18  Weight: 106 lb (48.081 kg)  SpO2: 98%      Assessment & Plan:

## 2014-07-14 NOTE — Patient Instructions (Addendum)
You are okay to start taking the allergy shots again.   I would start using the flonase you have at home as this will help to clear up the sinuses a little better than the zyrtec.   The cough may linger for several more weeks as you are still having drainage and dripping into the lungs. It should gradually get better over time.   Your lungs sound good and clear today, no wheezing or anything that sounds like asthma.

## 2014-07-16 DIAGNOSIS — J301 Allergic rhinitis due to pollen: Secondary | ICD-10-CM | POA: Diagnosis not present

## 2014-07-16 DIAGNOSIS — J3089 Other allergic rhinitis: Secondary | ICD-10-CM | POA: Diagnosis not present

## 2014-07-23 DIAGNOSIS — J301 Allergic rhinitis due to pollen: Secondary | ICD-10-CM | POA: Diagnosis not present

## 2014-07-23 DIAGNOSIS — J3089 Other allergic rhinitis: Secondary | ICD-10-CM | POA: Diagnosis not present

## 2014-07-28 ENCOUNTER — Ambulatory Visit (INDEPENDENT_AMBULATORY_CARE_PROVIDER_SITE_OTHER): Payer: Medicare Other | Admitting: Gynecology

## 2014-07-28 ENCOUNTER — Encounter: Payer: Self-pay | Admitting: Gynecology

## 2014-07-28 ENCOUNTER — Other Ambulatory Visit: Payer: Self-pay | Admitting: Internal Medicine

## 2014-07-28 VITALS — BP 136/74 | Ht 63.0 in | Wt 105.0 lb

## 2014-07-28 DIAGNOSIS — Z860100 Personal history of colon polyps, unspecified: Secondary | ICD-10-CM | POA: Insufficient documentation

## 2014-07-28 DIAGNOSIS — N952 Postmenopausal atrophic vaginitis: Secondary | ICD-10-CM | POA: Diagnosis not present

## 2014-07-28 DIAGNOSIS — M8580 Other specified disorders of bone density and structure, unspecified site: Secondary | ICD-10-CM

## 2014-07-28 DIAGNOSIS — Z79890 Hormone replacement therapy (postmenopausal): Secondary | ICD-10-CM

## 2014-07-28 DIAGNOSIS — Z01419 Encounter for gynecological examination (general) (routine) without abnormal findings: Secondary | ICD-10-CM | POA: Diagnosis not present

## 2014-07-28 MED ORDER — NONFORMULARY OR COMPOUNDED ITEM: Status: DC

## 2014-07-28 NOTE — Patient Instructions (Signed)
Colonoscopy A colonoscopy is an exam to look at the entire large intestine (colon). This exam can help find problems such as tumors, polyps, inflammation, and areas of bleeding. The exam takes about 1 hour.  LET [facility] CARE PROVIDER KNOW ABOUT:   Any allergies you have.  All medicines you are taking, including vitamins, herbs, eye drops, creams, and over-the-counter medicines.  Previous problems you or members of your family have had with the use of anesthetics.  Any blood disorders you have.  Previous surgeries you have had.  Medical conditions you have. RISKS AND COMPLICATIONS  Generally, this is a safe procedure. However, as with any procedure, complications can occur. Possible complications include:  Bleeding.  Tearing or rupture of the colon wall.  Reaction to medicines given during the exam.  Infection (rare). BEFORE THE PROCEDURE   Ask your health care provider about changing or stopping your regular medicines.  You may be prescribed an oral bowel prep. This involves drinking a large amount of medicated liquid, starting the day before your procedure. The liquid will cause you to have multiple loose stools until your stool is almost clear or light green. This cleans out your colon in preparation for the procedure.  Do not eat or drink anything else once you have started the bowel prep, unless your health care provider tells you it is safe to do so.  Arrange for someone to drive you home after the procedure. PROCEDURE   You will be given medicine to help you relax (sedative).  You will lie on your side with your knees bent.  A long, flexible tube with a light and camera on the end (colonoscope) will be inserted through the rectum and into the colon. The camera sends video back to a computer screen as it moves through the colon. The colonoscope also releases carbon dioxide gas to inflate the colon. This helps your health care provider see the area better.  During  the exam, your health care provider may take a small tissue sample (biopsy) to be examined under a microscope if any abnormalities are found.  The exam is finished when the entire colon has been viewed. AFTER THE PROCEDURE   Do not drive for 24 hours after the exam.  You may have a small amount of blood in your stool.  You may pass moderate amounts of gas and have mild abdominal cramping or bloating. This is caused by the gas used to inflate your colon during the exam.  Ask when your test results will be ready and how you will get your results. Make sure you get your test results. Document Released: 04/14/2000 Document Revised: 02/05/2013 Document Reviewed: 12/23/2012 [facility] Patient Information 2015 [place], [place]. This information is not intended to replace advice given to you by your health care provider. Make sure you discuss any questions you have with your health care provider. Bone Densitometry Bone densitometry is a special X-ray that measures your bone density and can be used to help predict your risk of bone fractures. This test is used to determine bone mineral content and density to diagnose osteoporosis. Osteoporosis is the loss of bone that may cause the bone to become weak. Osteoporosis commonly occurs in women entering menopause. However, it may be found in men and in people with other diseases. PREPARATION FOR TEST No preparation necessary. WHO SHOULD BE TESTED?  All women older than 68.  Postmenopausal women (50 to 75) with risk factors for osteoporosis.  People with a previous fracture caused  by normal activities.  People with a small body frame (less than 127 poundsor a body mass index [BMI] of less than 21).  People who have a parent with a hip fracture or history of osteoporosis.  People who smoke.  People who have rheumatoid arthritis.  Anyone who engages in excessive alcohol use (more than 3 drinks most days).  Women who experience early menopause. WHEN  SHOULD YOU BE RETESTED? Current guidelines suggest that you should wait at least 2 years before doing a bone density test again if your first test was normal.Recent studies indicated that women with normal bone density may be able to wait a few years before needing to repeat a bone density test. You should discuss this with your caregiver.  NORMAL FINDINGS   Normal: less than standard deviation below normal (greater than -1).  Osteopenia: 1 to 2.5 standard deviations below normal (-1 to -2.5).  Osteoporosis: greater than 2.5 standard deviations below normal (less than -2.5). Test results are reported as a "T score" and a "Z score."The T score is a number that compares your bone density with the bone density of healthy, young women.The Z score is a number that compares your bone density with the scores of women who are the same age, gender, and race.  Ranges for normal findings may vary among different laboratories and hospitals. You should always check with your doctor after having lab work or other tests done to discuss the meaning of your test results and whether your values are considered within normal limits. MEANING OF TEST  Your caregiver will go over the test results with you and discuss the importance and meaning of your results, as well as treatment options and the need for additional tests if necessary. OBTAINING THE TEST RESULTS It is your responsibility to obtain your test results. Ask the lab or department performing the test when and how you will get your results. Document Released: 05/09/2004 Document Revised: 07/10/2011 Document Reviewed: 06/01/2010 [facility] Patient Information 2015 [place], [place]. This information is not intended to replace advice given to you by your health care provider. Make sure you discuss any questions you have with your health care provider.

## 2014-07-28 NOTE — Telephone Encounter (Signed)
[facility]ed [facility] spoke with [place] gave md approval.../lmb

## 2014-07-28 NOTE — Progress Notes (Signed)
[name] [name] 01-Jan-1936 [id number]   History:    79 y.o.  for annual gyn exam who had questions in reference to recent mammogram which showed that her breast were dense but otherwise normal. Patient with past history of vaginal atrophy and has done well with twice a week for vaginal estradiol cream.Review of patient's records indicated that in the past and she had had history of osteoporosis and had been on Fosamax for 5 years and discontinued in 2012 is currently on a drug holiday. Her last bone density study was in 2014 which demonstrated that the lowest T score was -2.2 at the AP spine. She is currently taking calcium and vitamin D and exercises on a regular basis. Her new primary care physician is Dr. Lucie Leather who will be doing her blood work. Patient's last colonoscopy was in 2009 and patient reports past history of benign colon polyps. All her vaccines are up-to-date. She did have shingles as well as pneumonia early part of this year but has recovered well. She's currently taking care of a sick spells. Patient with no past history of any abnormal Pap smears.  Past medical history,surgical history, family history and social history were all reviewed and documented in the EPIC chart.  Gynecologic History No LMP recorded. Patient is postmenopausal. Contraception: post menopausal status Last Pap: 2013. Results were: normal Last mammogram: 2016. Results were: Normal but dense had three-dimensional mammogram  Obstetric History OB History  Gravida Para Term Preterm AB SAB TAB Ectopic Multiple Living  1 1 1   0     1    # Outcome Date GA Lbr Len/2nd Weight Sex Delivery Anes PTL Lv  1 Term                ROS: A ROS was performed and pertinent positives and negatives are included in the history.  GENERAL: No fevers or chills. HEENT: No change in vision, no earache, sore throat or sinus congestion. NECK: No pain or stiffness. CARDIOVASCULAR: No chest pain or pressure. No  palpitations. PULMONARY: No shortness of breath, cough or wheeze. GASTROINTESTINAL: No abdominal pain, nausea, vomiting or diarrhea, melena or bright red blood per rectum. GENITOURINARY: No urinary frequency, urgency, hesitancy or dysuria. MUSCULOSKELETAL: No joint or muscle pain, no back pain, no recent trauma. DERMATOLOGIC: No rash, no itching, no lesions. ENDOCRINE: No polyuria, polydipsia, no heat or cold intolerance. No recent change in weight. HEMATOLOGICAL: No anemia or easy bruising or bleeding. NEUROLOGIC: No headache, seizures, numbness, tingling or weakness. PSYCHIATRIC: No depression, no loss of interest in normal activity or change in sleep pattern.     Exam: chaperone present  BP 136/74 mmHg  Ht 5\' 3"  (1.6 m)  Wt 105 lb (47.628 kg)  BMI 18.60 kg/m2  Body mass index is 18.6 kg/(m^2).  General appearance : Well developed well nourished female. No acute distress HEENT: Eyes: no retinal hemorrhage or exudates,  Neck supple, trachea midline, no carotid bruits, no thyroidmegaly Lungs: Clear to auscultation, no rhonchi or wheezes, or rib retractions  Heart: Regular rate and rhythm, no murmurs or gallops Breast:Examined in sitting and supine position were symmetrical in appearance, no palpable masses or tenderness,  no skin retraction, no nipple inversion, no nipple discharge, no skin discoloration, no axillary or supraclavicular lymphadenopathy Abdomen: no palpable masses or tenderness, no rebound or guarding Extremities: no edema or skin discoloration or tenderness  Pelvic:  Bartholin, Urethra, Skene Glands: Within normal limits  Vagina: No gross lesions or discharge, atrophic changes  Cervix: No gross lesions or discharge  Uterus  axial, normal size, shape and consistency, non-tender and mobile  Adnexa  Without masses or tenderness  Anus and perineum  normal   Rectovaginal  normal sphincter tone without palpated masses or tenderness             Hemoccult PCP provides       Assessment/Plan:  79 y.o. female for annual exam with history of osteopenia on drug holiday. Patient due for bone density study as well as colonoscopy. Pap smear not done in accordance to the new guidelines. She was encouraged to continue her regular exercise regimen as well as her calcium and vitamin D intake. Her PCP we'll be doing her blood work. Prescription refill for vaginal estradiol was provided.   Terrance Mass MD, 2:30 PM 07/28/2014

## 2014-07-30 DIAGNOSIS — J3089 Other allergic rhinitis: Secondary | ICD-10-CM | POA: Diagnosis not present

## 2014-07-30 DIAGNOSIS — J301 Allergic rhinitis due to pollen: Secondary | ICD-10-CM | POA: Diagnosis not present

## 2014-08-06 DIAGNOSIS — J301 Allergic rhinitis due to pollen: Secondary | ICD-10-CM | POA: Diagnosis not present

## 2014-08-06 DIAGNOSIS — J3089 Other allergic rhinitis: Secondary | ICD-10-CM | POA: Diagnosis not present

## 2014-08-10 DIAGNOSIS — J301 Allergic rhinitis due to pollen: Secondary | ICD-10-CM | POA: Diagnosis not present

## 2014-08-10 DIAGNOSIS — J3089 Other allergic rhinitis: Secondary | ICD-10-CM | POA: Diagnosis not present

## 2014-08-11 ENCOUNTER — Encounter: Payer: Self-pay | Admitting: Internal Medicine

## 2014-08-11 ENCOUNTER — Other Ambulatory Visit (INDEPENDENT_AMBULATORY_CARE_PROVIDER_SITE_OTHER): Payer: Medicare Other

## 2014-08-11 ENCOUNTER — Ambulatory Visit (INDEPENDENT_AMBULATORY_CARE_PROVIDER_SITE_OTHER): Payer: Medicare Other | Admitting: Internal Medicine

## 2014-08-11 VITALS — BP 130/84 | HR 107 | Temp 97.8°F | Resp 18 | Wt 102.0 lb

## 2014-08-11 DIAGNOSIS — R5383 Other fatigue: Secondary | ICD-10-CM

## 2014-08-11 DIAGNOSIS — Z0000 Encounter for general adult medical examination without abnormal findings: Secondary | ICD-10-CM | POA: Diagnosis not present

## 2014-08-11 DIAGNOSIS — M8580 Other specified disorders of bone density and structure, unspecified site: Secondary | ICD-10-CM

## 2014-08-11 DIAGNOSIS — M810 Age-related osteoporosis without current pathological fracture: Secondary | ICD-10-CM | POA: Diagnosis not present

## 2014-08-11 DIAGNOSIS — I10 Essential (primary) hypertension: Secondary | ICD-10-CM

## 2014-08-11 DIAGNOSIS — I2510 Atherosclerotic heart disease of native coronary artery without angina pectoris: Secondary | ICD-10-CM

## 2014-08-11 DIAGNOSIS — E785 Hyperlipidemia, unspecified: Secondary | ICD-10-CM

## 2014-08-11 DIAGNOSIS — Z1211 Encounter for screening for malignant neoplasm of colon: Secondary | ICD-10-CM

## 2014-08-11 DIAGNOSIS — M797 Fibromyalgia: Secondary | ICD-10-CM

## 2014-08-11 LAB — CBC
HCT: 44.5 % (ref 36.0–46.0)
HEMOGLOBIN: 15.3 g/dL — AB (ref 12.0–15.0)
MCHC: 34.3 g/dL (ref 30.0–36.0)
MCV: 101 fl — ABNORMAL HIGH (ref 78.0–100.0)
Platelets: 264 10*3/uL (ref 150.0–400.0)
RBC: 4.41 Mil/uL (ref 3.87–5.11)
RDW: 13.1 % (ref 11.5–15.5)
WBC: 6.7 10*3/uL (ref 4.0–10.5)

## 2014-08-11 LAB — COMPREHENSIVE METABOLIC PANEL
ALK PHOS: 72 U/L (ref 39–117)
ALT: 17 U/L (ref 0–35)
AST: 22 U/L (ref 0–37)
Albumin: 4.5 g/dL (ref 3.5–5.2)
BILIRUBIN TOTAL: 0.5 mg/dL (ref 0.2–1.2)
BUN: 12 mg/dL (ref 6–23)
CO2: 29 mEq/L (ref 19–32)
Calcium: 10.2 mg/dL (ref 8.4–10.5)
Chloride: 100 mEq/L (ref 96–112)
Creatinine, Ser: 0.79 mg/dL (ref 0.40–1.20)
GFR: 74.96 mL/min (ref >60.00)
GLUCOSE: 98 mg/dL (ref 70–99)
Potassium: 4.1 mEq/L (ref 3.5–5.1)
Sodium: 136 mEq/L (ref 135–145)
Total Protein: 7.8 g/dL (ref 6.0–8.3)

## 2014-08-11 LAB — LIPID PANEL
CHOLESTEROL: 220 mg/dL — AB (ref 0–200)
HDL: 119 mg/dL (ref >39.00)
LDL CALC: 85 mg/dL (ref 0–99)
NonHDL: 101
Total CHOL/HDL Ratio: 2
Triglycerides: 78 mg/dL (ref 0.0–149.0)
VLDL: 15.6 mg/dL (ref 0.0–40.0)

## 2014-08-11 LAB — TSH: TSH: 1.19 u[IU]/mL (ref 0.35–4.50)

## 2014-08-11 MED ORDER — CYCLOBENZAPRINE HCL 10 MG PO TABS: 10.0000 mg | ORAL_TABLET | Freq: Three times a day (TID) | ORAL | Status: DC | PRN

## 2014-08-11 NOTE — Assessment & Plan Note (Signed)
Refer to GI as she thinks she is overdue for colon cancer screening. Bone density ordered today. Up to date on shingles, pneumonia, tetanus, flu shots. 10 year screening guide provided to her today. She is fairly active and will work on regular exercise. Non-smoker. Talked with her about home safety. She has already thought about advanced care planning.

## 2014-08-11 NOTE — Assessment & Plan Note (Signed)
BP well controlled on her amlodipine. Check labs today.

## 2014-08-11 NOTE — Progress Notes (Signed)
Pre visit review using our clinic review tool, if applicable. No additional management support is needed unless otherwise documented below in the visit note. 

## 2014-08-11 NOTE — Patient Instructions (Addendum)
We will check the blood work today and call you back as well as mail you the results.   We will send you back for colonoscopy but with a different doctor.   We have sent in the flexeril so if you need it, it is waiting at the pharmacy.   Keep up the good work with your health. We have ordered the bone density as well so you should hear back about getting that done.   Health Maintenance Adopting a healthy lifestyle and getting preventive care can go a long way to promote health and wellness. Talk with your health care provider about what schedule of regular examinations is right for you. This is a good chance for you to check in with your provider about disease prevention and staying healthy. In between checkups, there are plenty of things you can do on your own. Experts have done a lot of research about which lifestyle changes and preventive measures are most likely to keep you healthy. Ask your health care provider for more information. WEIGHT AND DIET  Eat a healthy diet  Be sure to include plenty of vegetables, fruits, low-fat dairy products, and lean protein.  Do not eat a lot of foods high in solid fats, added sugars, or salt.  Get regular exercise. This is one of the most important things you can do for your health.  Most adults should exercise for at least 150 minutes each week. The exercise should increase your heart rate and make you sweat (moderate-intensity exercise).  Most adults should also do strengthening exercises at least twice a week. This is in addition to the moderate-intensity exercise.  Maintain a healthy weight  Body mass index (BMI) is a measurement that can be used to identify possible weight problems. It estimates body fat based on height and weight. Your health care provider can help determine your BMI and help you achieve or maintain a healthy weight.  For females 27 years of age and older:   A BMI below 18.5 is considered underweight.  A BMI of 18.5 to  24.9 is normal.  A BMI of 25 to 29.9 is considered overweight.  A BMI of 30 and above is considered obese.  Watch levels of cholesterol and blood lipids  You should start having your blood tested for lipids and cholesterol at 77 years of age, then have this test every 5 years.  You may need to have your cholesterol levels checked more often if:  Your lipid or cholesterol levels are high.  You are older than 77 years of age.  You are at high risk for heart disease.  CANCER SCREENING   Lung Cancer  Lung cancer screening is recommended for adults 57-41 years old who are at high risk for lung cancer because of a history of smoking.  A yearly low-dose CT scan of the lungs is recommended for people who:  Currently smoke.  Have quit within the past 15 years.  Have at least a 30-pack-year history of smoking. A pack year is smoking an average of one pack of cigarettes a day for 1 year.  Yearly screening should continue until it has been 15 years since you quit.  Yearly screening should stop if you develop a health problem that would prevent you from having lung cancer treatment.  Breast Cancer  Practice breast self-awareness. This means understanding how your breasts normally appear and feel.  It also means doing regular breast self-exams. Let your health care provider know about any changes,  no matter how small.  If you are in your 20s or 30s, you should have a clinical breast exam (CBE) by a health care provider every 1-3 years as part of a regular health exam.  If you are 53 or older, have a CBE every year. Also consider having a breast X-ray (mammogram) every year.  If you have a family history of breast cancer, talk to your health care provider about genetic screening.  If you are at high risk for breast cancer, talk to your health care provider about having an MRI and a mammogram every year.  Breast cancer gene (BRCA) assessment is recommended for women who have family  members with BRCA-related cancers. BRCA-related cancers include:  Breast.  Ovarian.  Tubal.  Peritoneal cancers.  Results of the assessment will determine the need for genetic counseling and BRCA1 and BRCA2 testing. Cervical Cancer Routine pelvic examinations to screen for cervical cancer are no longer recommended for nonpregnant women who are considered low risk for cancer of the pelvic organs (ovaries, uterus, and vagina) and who do not have symptoms. A pelvic examination may be necessary if you have symptoms including those associated with pelvic infections. Ask your health care provider if a screening pelvic exam is right for you.   The Pap test is the screening test for cervical cancer for women who are considered at risk.  If you had a hysterectomy for a problem that was not cancer or a condition that could lead to cancer, then you no longer need Pap tests.  If you are older than 65 years, and you have had normal Pap tests for the past 10 years, you no longer need to have Pap tests.  If you have had past treatment for cervical cancer or a condition that could lead to cancer, you need Pap tests and screening for cancer for at least 20 years after your treatment.  If you no longer get a Pap test, assess your risk factors if they change (such as having a new sexual partner). This can affect whether you should start being screened again.  Some women have medical problems that increase their chance of getting cervical cancer. If this is the case for you, your health care provider may recommend more frequent screening and Pap tests.  The human papillomavirus (HPV) test is another test that may be used for cervical cancer screening. The HPV test looks for the virus that can cause cell changes in the cervix. The cells collected during the Pap test can be tested for HPV.  The HPV test can be used to screen women 55 years of age and older. Getting tested for HPV can extend the interval  between normal Pap tests from three to five years.  An HPV test also should be used to screen women of any age who have unclear Pap test results.  After 77 years of age, women should have HPV testing as often as Pap tests.  Colorectal Cancer  This type of cancer can be detected and often prevented.  Routine colorectal cancer screening usually begins at 77 years of age and continues through 77 years of age.  Your health care provider may recommend screening at an earlier age if you have risk factors for colon cancer.  Your health care provider may also recommend using home test kits to check for hidden blood in the stool.  A small camera at the end of a tube can be used to examine your colon directly (sigmoidoscopy or colonoscopy). This  is done to check for the earliest forms of colorectal cancer.  Routine screening usually begins at age 50.  Direct examination of the colon should be repeated every 5-10 years through 77 years of age. However, you may need to be screened more often if early forms of precancerous polyps or small growths are found. Skin Cancer  Check your skin from head to toe regularly.  Tell your health care provider about any new moles or changes in moles, especially if there is a change in a mole's shape or color.  Also tell your health care provider if you have a mole that is larger than the size of a pencil eraser.  Always use sunscreen. Apply sunscreen liberally and repeatedly throughout the day.  Protect yourself by wearing long sleeves, pants, a wide-brimmed hat, and sunglasses whenever you are outside. HEART DISEASE, DIABETES, AND HIGH BLOOD PRESSURE   Have your blood pressure checked at least every 1-2 years. High blood pressure causes heart disease and increases the risk of stroke.  If you are between 23 years and 20 years old, ask your health care provider if you should take aspirin to prevent strokes.  Have regular diabetes screenings. This involves  taking a blood sample to check your fasting blood sugar level.  If you are at a normal weight and have a low risk for diabetes, have this test once every three years after 77 years of age.  If you are overweight and have a high risk for diabetes, consider being tested at a younger age or more often. PREVENTING INFECTION  Hepatitis B  If you have a higher risk for hepatitis B, you should be screened for this virus. You are considered at high risk for hepatitis B if:  You were born in a country where hepatitis B is common. Ask your health care provider which countries are considered high risk.  Your parents were born in a high-risk country, and you have not been immunized against hepatitis B (hepatitis B vaccine).  You have HIV or AIDS.  You use needles to inject street drugs.  You live with someone who has hepatitis B.  You have had sex with someone who has hepatitis B.  You get hemodialysis treatment.  You take certain medicines for conditions, including cancer, organ transplantation, and autoimmune conditions. Hepatitis C  Blood testing is recommended for:  Everyone born from [birth year] through 1963.  Anyone with known risk factors for hepatitis C. Sexually transmitted infections (STIs)  You should be screened for sexually transmitted infections (STIs) including gonorrhea and chlamydia if:  You are sexually active and are younger than 77 years of age.  You are older than 77 years of age and your health care provider tells you that you are at risk for this type of infection.  Your sexual activity has changed since you were last screened and you are at an increased risk for chlamydia or gonorrhea. Ask your health care provider if you are at risk.  If you do not have HIV, but are at risk, it may be recommended that you take a prescription medicine daily to prevent HIV infection. This is called pre-exposure prophylaxis (PrEP). You are considered at risk if:  You are sexually active  and do not regularly use condoms or know the HIV status of your partner(s).  You take drugs by injection.  You are sexually active with a partner who has HIV. Talk with your health care provider about whether you are at high risk of being  infected with HIV. If you choose to begin PrEP, you should first be tested for HIV. You should then be tested every 3 months for as long as you are taking PrEP.  PREGNANCY   If you are premenopausal and you may become pregnant, ask your health care provider about preconception counseling.  If you may become pregnant, take 400 to 800 micrograms (mcg) of folic acid every day.  If you want to prevent pregnancy, talk to your health care provider about birth control (contraception). OSTEOPOROSIS AND MENOPAUSE   Osteoporosis is a disease in which the bones lose minerals and strength with aging. This can result in serious bone fractures. Your risk for osteoporosis can be identified using a bone density scan.  If you are 42 years of age or older, or if you are at risk for osteoporosis and fractures, ask your health care provider if you should be screened.  Ask your health care provider whether you should take a calcium or vitamin D supplement to lower your risk for osteoporosis.  Menopause may have certain physical symptoms and risks.  Hormone replacement therapy may reduce some of these symptoms and risks. Talk to your health care provider about whether hormone replacement therapy is right for you.  HOME CARE INSTRUCTIONS   Schedule regular health, dental, and eye exams.  Stay current with your immunizations.   Do not use any tobacco products including cigarettes, chewing tobacco, or electronic cigarettes.  If you are pregnant, do not drink alcohol.  If you are breastfeeding, limit how much and how often you drink alcohol.  Limit alcohol intake to no more than 1 drink per day for nonpregnant women. One drink equals 12 ounces of beer, 5 ounces of wine,  or 1 ounces of hard liquor.  Do not use street drugs.  Do not share needles.  Ask your health care provider for help if you need support or information about quitting drugs.  Tell your health care provider if you often feel depressed.  Tell your health care provider if you have ever been abused or do not feel safe at home. Document Released: 10/31/2010 Document Revised: 09/01/2013 Document Reviewed: 03/19/2013 [facility] Patient Information 2015 [place], [place]. This information is not intended to replace advice given to you by your health care provider. Make sure you discuss any questions you have with your health care provider.

## 2014-08-11 NOTE — Assessment & Plan Note (Signed)
Bone density ordered today as due. Now doing weight bearing activity and vitamin d and calcium supplementation.

## 2014-08-11 NOTE — Progress Notes (Signed)
   Subjective:    Patient ID: [name] [name], female    DOB: [birth year], 79 y.o.   MRN: [id number]  HPI Here for medicare wellness, no new complaints. Please see A/P for status and treatment of chronic medical problems. Cough is gone since last visit. No new problems. No new problems with medicines.   Diet: heart healthy Physical activity: mildly active Depression/mood screen: negative Hearing: intact to whispered voice Visual acuity: grossly normal, performs annual eye exam  ADLs: capable Fall risk: low Home safety: good Cognitive evaluation: intact to orientation, naming, recall and repetition EOL planning: adv directives, full code/ I agree  I have personally reviewed and have noted 1. The patient's medical and social history - reviewed no updated today 2. Their use of alcohol, tobacco or illicit drugs 3. Their current medications and supplements 4. The patient's functional ability including ADL's, fall risks, home safety risks and hearing or visual impairment. 5. Diet and physical activities 6. Evidence for depression or mood disorders 7. Care team reviewed and updated (available in snapshot)  Review of Systems  Constitutional: Negative for fever, activity change, appetite change, fatigue and unexpected weight change.  HENT: Positive for congestion. Negative for ear discharge, ear pain, postnasal drip, sinus pressure, sore throat and voice change.   Respiratory: Negative for cough, chest tightness, shortness of breath and wheezing.   Cardiovascular: Negative for chest pain, palpitations and leg swelling.  Gastrointestinal: Negative for abdominal pain, diarrhea, constipation, blood in stool and abdominal distention.  Musculoskeletal: Positive for arthralgias. Negative for gait problem.       Rare  Skin: Negative.   Neurological: Negative for dizziness, weakness, light-headedness and headaches.  Psychiatric/Behavioral: Negative.       Objective:   Physical Exam    Constitutional: She is oriented to person, place, and time. She appears well-developed and well-nourished.  HENT:  Head: Normocephalic and atraumatic.  Nose improved, throat clear. Ears with normal TMs.  Eyes: EOM are normal.  Neck: Normal range of motion.  Cardiovascular: Normal rate and regular rhythm.   Pulmonary/Chest: Effort normal and breath sounds normal. No respiratory distress. She has no wheezes. She has no rales.  Abdominal: Soft. Bowel sounds are normal. She exhibits no distension. There is no tenderness. There is no rebound.  Neurological: She is alert and oriented to person, place, and time. Coordination normal.  Skin: Skin is warm and dry.   Filed Vitals:   08/11/14 0930  BP: 130/84  Pulse: 107  Temp: 97.8 F (36.6 C)  TempSrc: Oral  Resp: 18  Weight: 102 lb (46.267 kg)  SpO2: 97%      Assessment & Plan:

## 2014-08-11 NOTE — Assessment & Plan Note (Signed)
Takes ASA daily, no anginal symptoms. Check lipid panel today.

## 2014-08-11 NOTE — Assessment & Plan Note (Signed)
No flare today, refilled flexeril which she takes in flares.

## 2014-08-13 DIAGNOSIS — J301 Allergic rhinitis due to pollen: Secondary | ICD-10-CM | POA: Diagnosis not present

## 2014-08-13 DIAGNOSIS — J3089 Other allergic rhinitis: Secondary | ICD-10-CM | POA: Diagnosis not present

## 2014-08-13 NOTE — Progress Notes (Signed)
Called and advised pt of lab results. 

## 2014-08-20 ENCOUNTER — Ambulatory Visit (INDEPENDENT_AMBULATORY_CARE_PROVIDER_SITE_OTHER)
Admission: RE | Admit: 2014-08-20 | Discharge: 2014-08-20 | Disposition: A | Discharge status: Home / Self Care | Payer: Medicare Other | Source: Ambulatory Visit | Attending: Internal Medicine | Admitting: Internal Medicine

## 2014-08-20 DIAGNOSIS — M810 Age-related osteoporosis without current pathological fracture: Secondary | ICD-10-CM

## 2014-08-20 DIAGNOSIS — J3089 Other allergic rhinitis: Secondary | ICD-10-CM | POA: Diagnosis not present

## 2014-08-20 DIAGNOSIS — J301 Allergic rhinitis due to pollen: Secondary | ICD-10-CM | POA: Diagnosis not present

## 2014-08-27 DIAGNOSIS — J301 Allergic rhinitis due to pollen: Secondary | ICD-10-CM | POA: Diagnosis not present

## 2014-08-27 DIAGNOSIS — J3089 Other allergic rhinitis: Secondary | ICD-10-CM | POA: Diagnosis not present

## 2014-09-03 DIAGNOSIS — J301 Allergic rhinitis due to pollen: Secondary | ICD-10-CM | POA: Diagnosis not present

## 2014-09-03 DIAGNOSIS — J3089 Other allergic rhinitis: Secondary | ICD-10-CM | POA: Diagnosis not present

## 2014-09-08 DIAGNOSIS — L821 Other seborrheic keratosis: Secondary | ICD-10-CM | POA: Diagnosis not present

## 2014-09-08 DIAGNOSIS — L820 Inflamed seborrheic keratosis: Secondary | ICD-10-CM | POA: Diagnosis not present

## 2014-09-09 DIAGNOSIS — J301 Allergic rhinitis due to pollen: Secondary | ICD-10-CM | POA: Diagnosis not present

## 2014-09-09 DIAGNOSIS — J3089 Other allergic rhinitis: Secondary | ICD-10-CM | POA: Diagnosis not present

## 2014-09-17 DIAGNOSIS — J3089 Other allergic rhinitis: Secondary | ICD-10-CM | POA: Diagnosis not present

## 2014-09-17 DIAGNOSIS — J301 Allergic rhinitis due to pollen: Secondary | ICD-10-CM | POA: Diagnosis not present

## 2014-09-24 DIAGNOSIS — J301 Allergic rhinitis due to pollen: Secondary | ICD-10-CM | POA: Diagnosis not present

## 2014-09-24 DIAGNOSIS — J3089 Other allergic rhinitis: Secondary | ICD-10-CM | POA: Diagnosis not present

## 2014-10-01 DIAGNOSIS — J301 Allergic rhinitis due to pollen: Secondary | ICD-10-CM | POA: Diagnosis not present

## 2014-10-01 DIAGNOSIS — J3089 Other allergic rhinitis: Secondary | ICD-10-CM | POA: Diagnosis not present

## 2014-10-07 ENCOUNTER — Other Ambulatory Visit: Payer: Self-pay | Admitting: Gynecology

## 2014-10-08 DIAGNOSIS — J301 Allergic rhinitis due to pollen: Secondary | ICD-10-CM | POA: Diagnosis not present

## 2014-10-08 DIAGNOSIS — J3089 Other allergic rhinitis: Secondary | ICD-10-CM | POA: Diagnosis not present

## 2014-10-15 DIAGNOSIS — J3089 Other allergic rhinitis: Secondary | ICD-10-CM | POA: Diagnosis not present

## 2014-10-15 DIAGNOSIS — J3081 Allergic rhinitis due to animal (cat) (dog) hair and dander: Secondary | ICD-10-CM | POA: Diagnosis not present

## 2014-10-15 DIAGNOSIS — J301 Allergic rhinitis due to pollen: Secondary | ICD-10-CM | POA: Diagnosis not present

## 2014-10-19 ENCOUNTER — Other Ambulatory Visit: Payer: Self-pay | Admitting: Internal Medicine

## 2014-10-19 NOTE — Telephone Encounter (Signed)
Will refill for #70 refills 1 as this appears to be her 1 month supply.

## 2014-10-22 DIAGNOSIS — J3089 Other allergic rhinitis: Secondary | ICD-10-CM | POA: Diagnosis not present

## 2014-10-22 DIAGNOSIS — J301 Allergic rhinitis due to pollen: Secondary | ICD-10-CM | POA: Diagnosis not present

## 2014-10-26 DIAGNOSIS — L814 Other melanin hyperpigmentation: Secondary | ICD-10-CM | POA: Diagnosis not present

## 2014-10-26 DIAGNOSIS — D225 Melanocytic nevi of trunk: Secondary | ICD-10-CM | POA: Diagnosis not present

## 2014-10-26 DIAGNOSIS — L821 Other seborrheic keratosis: Secondary | ICD-10-CM | POA: Diagnosis not present

## 2014-10-29 DIAGNOSIS — J3089 Other allergic rhinitis: Secondary | ICD-10-CM | POA: Diagnosis not present

## 2014-10-29 DIAGNOSIS — J301 Allergic rhinitis due to pollen: Secondary | ICD-10-CM | POA: Diagnosis not present

## 2014-11-04 ENCOUNTER — Ambulatory Visit (INDEPENDENT_AMBULATORY_CARE_PROVIDER_SITE_OTHER): Payer: Medicare Other | Admitting: Women's Health

## 2014-11-04 ENCOUNTER — Encounter: Payer: Self-pay | Admitting: Women's Health

## 2014-11-04 VITALS — BP 150/80 | Ht 63.0 in | Wt 106.0 lb

## 2014-11-04 DIAGNOSIS — R350 Frequency of micturition: Secondary | ICD-10-CM | POA: Diagnosis not present

## 2014-11-04 DIAGNOSIS — B373 Candidiasis of vulva and vagina: Secondary | ICD-10-CM

## 2014-11-04 DIAGNOSIS — N898 Other specified noninflammatory disorders of vagina: Secondary | ICD-10-CM

## 2014-11-04 DIAGNOSIS — I2510 Atherosclerotic heart disease of native coronary artery without angina pectoris: Secondary | ICD-10-CM | POA: Diagnosis not present

## 2014-11-04 DIAGNOSIS — B3731 Acute candidiasis of vulva and vagina: Secondary | ICD-10-CM

## 2014-11-04 LAB — WET PREP FOR TRICH, YEAST, CLUE
CLUE CELLS WET PREP: NONE SEEN
TRICH WET PREP: NONE SEEN
Yeast Wet Prep HPF POC: NONE SEEN

## 2014-11-04 LAB — URINALYSIS W MICROSCOPIC + REFLEX CULTURE
Bilirubin Urine: NEGATIVE
CASTS: NONE SEEN
CRYSTALS: NONE SEEN
Glucose, UA: NEGATIVE mg/dL
Ketones, ur: NEGATIVE mg/dL
NITRITE: NEGATIVE
PH: 7 (ref 5.0–8.0)
Protein, ur: NEGATIVE mg/dL
SPECIFIC GRAVITY, URINE: 1.01 (ref 1.005–1.030)
Urobilinogen, UA: 0.2 mg/dL (ref 0.0–1.0)

## 2014-11-04 MED ORDER — TERCONAZOLE 0.4 % VA CREA: 1.0000 | TOPICAL_CREAM | Freq: Every day | VAGINAL | Status: DC

## 2014-11-04 NOTE — Progress Notes (Signed)
Patient ID: [name] [name], female   DOB: June 14, 1935, 79 y.o.   MRN: [id number] HPI: Presents with external vaginal itching with discharge for 3 days. Denies fever, chills, bleeding, abdominal pain, back pain, urinary symptoms. No recent sexual activity. Tried estradiol last night, no relief. History of yeast infections.  Exam: Appears well External genitalia - erythematous  Wet prep done Q-tip only - moderate white blood cells, moderate bacteria UA-  trace blood, trace leukocytes, 11-20 white blood cells, few bacteria  Clinical yeast  vaginitis -  Plan:  Terazol 7, 45 g, apply externally at bedtime. Yeast prevention and hygiene discussed. Recommend OTC A & D ointment. Follow-up in 1 week if symptoms do not improve/worsen. UA culture pending.

## 2014-11-04 NOTE — Patient Instructions (Signed)

## 2014-11-05 DIAGNOSIS — J3089 Other allergic rhinitis: Secondary | ICD-10-CM | POA: Diagnosis not present

## 2014-11-05 DIAGNOSIS — J301 Allergic rhinitis due to pollen: Secondary | ICD-10-CM | POA: Diagnosis not present

## 2014-11-05 LAB — URINE CULTURE

## 2014-11-09 ENCOUNTER — Telehealth: Payer: Self-pay | Admitting: *Deleted

## 2014-11-09 MED ORDER — FLUCONAZOLE 150 MG PO TABS: ORAL_TABLET | ORAL | Status: DC

## 2014-11-09 NOTE — Telephone Encounter (Signed)
Left message for pt to call to find out what pharmacy she would like Rx sent to.

## 2014-11-09 NOTE — Telephone Encounter (Signed)
(  You are back up MD) pt saw nancy on 11/04/14 treated for vaginal itching given Rx for Terazol 7. States over the weekend vaginal burning worse, using Terazol internal and OTC A & D ointment externally. Negative urine culture, pt will be leaving out of town on Sunday. Pt asked if you have any recommendations? Please advise

## 2014-11-09 NOTE — Telephone Encounter (Signed)
Pt said send Rx to [facility], Rx sent

## 2014-11-09 NOTE — Telephone Encounter (Signed)
Call in Diflucan 150 mg. Call in 3 tablets. Have her take 1 every other day for 2 days and keep the third one in case she may need it on her trip

## 2014-11-12 DIAGNOSIS — J3089 Other allergic rhinitis: Secondary | ICD-10-CM | POA: Diagnosis not present

## 2014-11-12 DIAGNOSIS — J301 Allergic rhinitis due to pollen: Secondary | ICD-10-CM | POA: Diagnosis not present

## 2014-11-19 DIAGNOSIS — J301 Allergic rhinitis due to pollen: Secondary | ICD-10-CM | POA: Diagnosis not present

## 2014-11-19 DIAGNOSIS — J3089 Other allergic rhinitis: Secondary | ICD-10-CM | POA: Diagnosis not present

## 2014-11-24 ENCOUNTER — Telehealth: Payer: Self-pay | Admitting: *Deleted

## 2014-11-24 ENCOUNTER — Ambulatory Visit (INDEPENDENT_AMBULATORY_CARE_PROVIDER_SITE_OTHER): Payer: Medicare Other | Admitting: Women's Health

## 2014-11-24 ENCOUNTER — Encounter: Payer: Self-pay | Admitting: Women's Health

## 2014-11-24 VITALS — BP 128/80 | Ht 63.0 in | Wt 106.0 lb

## 2014-11-24 DIAGNOSIS — I2510 Atherosclerotic heart disease of native coronary artery without angina pectoris: Secondary | ICD-10-CM | POA: Diagnosis not present

## 2014-11-24 DIAGNOSIS — B373 Candidiasis of vulva and vagina: Secondary | ICD-10-CM

## 2014-11-24 DIAGNOSIS — B3731 Acute candidiasis of vulva and vagina: Secondary | ICD-10-CM

## 2014-11-24 LAB — WET PREP FOR TRICH, YEAST, CLUE
Clue Cells Wet Prep HPF POC: NONE SEEN
Trich, Wet Prep: NONE SEEN

## 2014-11-24 MED ORDER — FLUCONAZOLE 150 MG PO TABS: 150.0000 mg | ORAL_TABLET | Freq: Once | ORAL | Status: DC

## 2014-11-24 MED ORDER — NYSTATIN 100000 UNIT/GM EX CREA: 1.0000 "application " | TOPICAL_CREAM | Freq: Two times a day (BID) | CUTANEOUS | Status: DC

## 2014-11-24 MED ORDER — NYSTATIN 100000 UNIT/GM EX CREA: TOPICAL_CREAM | Freq: Two times a day (BID) | CUTANEOUS | Status: DC

## 2014-11-24 NOTE — Progress Notes (Signed)
Patient ID: [name] [name], female   DOB: 1935-09-01, 79 y.o.   MRN: [id number] Presents with recurrent yeast type symptoms, vaginal irritation with itching and burning. Had moderate relief after Diflucan. Urinary frequency same, denies urinary pain, abdominal pain or fever. Postmenopausal on no HRT. Treated for yeast 11/04/2014 .  Exam: Appears well. External genitalia mild erythema at introitus, urinary meatus diverticulum, speculum exam no visible erythema or discharge, wet prep positive for yeast.  Yeast vaginitis Atrophic vaginitis  Plan: Diflucan 150 by mouth today repeat in 3 days if needed. Nystatin externally twice daily, call if no relief of symptoms.

## 2014-11-24 NOTE — Telephone Encounter (Signed)
Per nancy young Rx for nystatin cream from today visit can be sent to pharmacy. Pt will be informed as well.

## 2014-11-26 DIAGNOSIS — J301 Allergic rhinitis due to pollen: Secondary | ICD-10-CM | POA: Diagnosis not present

## 2014-11-26 DIAGNOSIS — J3089 Other allergic rhinitis: Secondary | ICD-10-CM | POA: Diagnosis not present

## 2014-12-10 DIAGNOSIS — J3089 Other allergic rhinitis: Secondary | ICD-10-CM | POA: Diagnosis not present

## 2014-12-10 DIAGNOSIS — J301 Allergic rhinitis due to pollen: Secondary | ICD-10-CM | POA: Diagnosis not present

## 2014-12-17 DIAGNOSIS — J3089 Other allergic rhinitis: Secondary | ICD-10-CM | POA: Diagnosis not present

## 2014-12-17 DIAGNOSIS — J301 Allergic rhinitis due to pollen: Secondary | ICD-10-CM | POA: Diagnosis not present

## 2014-12-22 ENCOUNTER — Encounter: Payer: Self-pay | Admitting: Cardiology

## 2014-12-22 ENCOUNTER — Ambulatory Visit (INDEPENDENT_AMBULATORY_CARE_PROVIDER_SITE_OTHER): Payer: Medicare Other | Admitting: Cardiology

## 2014-12-22 VITALS — BP 154/78 | HR 79 | Ht 63.75 in | Wt 107.0 lb

## 2014-12-22 DIAGNOSIS — I10 Essential (primary) hypertension: Secondary | ICD-10-CM | POA: Diagnosis not present

## 2014-12-22 DIAGNOSIS — R0602 Shortness of breath: Secondary | ICD-10-CM

## 2014-12-22 DIAGNOSIS — I2510 Atherosclerotic heart disease of native coronary artery without angina pectoris: Secondary | ICD-10-CM

## 2014-12-22 DIAGNOSIS — I341 Nonrheumatic mitral (valve) prolapse: Secondary | ICD-10-CM | POA: Diagnosis not present

## 2014-12-22 MED ORDER — NITROGLYCERIN 0.4 MG SL SUBL: 0.4000 mg | SUBLINGUAL_TABLET | SUBLINGUAL | Status: DC | PRN

## 2014-12-22 NOTE — Progress Notes (Signed)
HPI The patient presents for followup of hypertension and palpitations. 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  She has been having some shortness of breath occasionally when she is emotionally stressed or rushing to do something. She recovers after several minutes. She denies any chest pressure, neck or arm discomfort. She's not having any PND or orthopnea. She still feels the palpitations that she was having but they're not increased frequency or intensity.   Allergies  Allergen Reactions  . Codeine     REACTION: NAUSEA  . Erythromycin     REACTION: NAUSEA  . Other     Environmental allergies    Current Outpatient Prescriptions  Medication Sig Dispense Refill  . ALPRAZolam (XANAX) 0.5 MG tablet TAKE 1/2 TO 1 TABLET THREE TIMES DAILY AS NEEDED. 70 tablet 1  . amLODipine (NORVASC) 2.5 MG tablet Take 1 tablet (2.5 mg total) by mouth daily. 90 tablet 3  . aspirin 81 MG tablet Take 81 mg by mouth daily.      Marland Kitchen BIOTIN 5000 PO Take by mouth.    . calcium carbonate (CALCIUM 600) 600 MG TABS Take 4 tablets by mouth daily     . cetirizine (ZYRTEC) 10 MG tablet Take 10 mg by mouth daily.      . clidinium-chlordiazePOXIDE (LIBRAX) 5-2.5 MG per capsule Take 1 capsule by mouth 3 (three) times daily as needed. For abd cramping 90 capsule 3  . cyclobenzaprine (FLEXERIL) 10 MG tablet Take 1 tablet (10 mg total) by mouth 3 (three) times daily as needed. 30 tablet 1  . EPINEPHrine (EPI-PEN) 0.3 mg/0.3 mL DEVI Inject 0.3 mg into the muscle as needed.    . Estradiol POWD APPLY 1ML VAGINALLY TWICE WEEKLY. 24 g 3  . fish oil-omega-3 fatty acids 1000 MG capsule Take 1 g by mouth daily.      . Multiple Vitamins-Minerals (PROTEGRA PO) Take by mouth daily.      . multivitamin (THERAGRAN) per tablet Take 1 tablet by mouth daily.      . nitroGLYCERIN (NITROSTAT) 0.4 MG SL tablet Place 1 tablet  (0.4 mg total) under the tongue every 5 (five) minutes as needed. May repeat 3 times 25 tablet 6  . NONFORMULARY OR COMPOUNDED ITEM Estradiol .02% 1 ML Prefilled Applicator Sig: apply vaginally twice a week #90 Day Supply with 4 refills 1 each 3  . nystatin cream (MYCOSTATIN) Apply 1 application topically 2 (two) times daily. 30 g 1  . traZODone (DESYREL) 100 MG tablet Take one tablet by mouth   nightly at bedtime 90 tablet 3  . UNABLE TO FIND Allergy shots weekly     Current Facility-Administered Medications  Medication Dose Route Frequency Provider Last Rate Last Dose  . nystatin cream (MYCOSTATIN)   Topical BID Huel Cote, NP        Past Medical History  Diagnosis Date  . Palpitations   . History of sinusitis   . History of pneumonia   . Diverticulosis of colon   . Hx of colonic polyps   . Lumbar back pain   . Fibromyalgia   . Anxiety   . MVP (mitral valve prolapse)     No antibiotics required for procedures  . Osteoporosis   . Coronary artery spasm     Non obstructive CAD  . Shingles   . Pneumonia     Past Surgical History  Procedure Laterality Date  . Cholecystectomy  03/2003  . Tonsillectomy      as a child  . Lumbar laminectomy      ROS:  As stated in the HPI and negative for all other systems.  PHYSICAL EXAM BP 154/78 mmHg  Pulse 79  Ht 5' 3.75" (1.619 m)  Wt 107 lb (48.535 kg)  BMI 18.52 kg/m2 GENERAL:  Well appearing NECK:  No jugular venous distention, waveform within normal limits, carotid upstroke brisk and symmetric, no bruits, no thyromegaly LUNGS:  Clear to auscultation bilaterally CHEST:  Unremarkable HEART:  PMI not displaced or sustained,S1 and S2 within normal limits, no S3, no S4, no clicks, no rubs, no murmurs ABD:  Flat, positive bowel sounds normal in frequency in pitch, no bruits, no rebound, no guarding, no midline pulsatile mass, no hepatomegaly, no splenomegaly EXT:  2 plus pulses throughout, no edema, no cyanosis no  clubbing  EKG: Normal sinus rhythm, rate 79, left axis, LAFB, poor anterior R-wave progression, no acute ST-T wave changes. No change from previous.  12/22/2014  ASSESSMENT AND PLAN  PALPITATIONS:  The patient has stable pattern of palpitations. She will continue on the meds as listed.  MVP:  I would not suspect any mitral regurgitation. No further imaging is indicated.  SOB:  She does have shortness of breath and has a brother who has had a diagnosis of coronary disease with some treatment recently. I will bring the patient back for a POET (Plain Old Exercise Test). This will allow me to screen for obstructive coronary disease, risk stratify and very importantly provide a prescription for exercise.

## 2014-12-22 NOTE — Patient Instructions (Signed)
Your physician wants you to follow-up in: 1 Year. You will receive a reminder letter in the mail two months in advance. If you don't receive a letter, please call our office to schedule the follow-up appointment.  Your physician has requested that you have an exercise tolerance test. For further information please visit [email or web address]. Please also follow instruction sheet, as given.    

## 2014-12-23 ENCOUNTER — Encounter: Payer: Self-pay | Admitting: Gynecology

## 2014-12-23 ENCOUNTER — Ambulatory Visit (INDEPENDENT_AMBULATORY_CARE_PROVIDER_SITE_OTHER): Payer: Medicare Other | Admitting: Gynecology

## 2014-12-23 VITALS — BP 136/80

## 2014-12-23 DIAGNOSIS — N952 Postmenopausal atrophic vaginitis: Secondary | ICD-10-CM | POA: Diagnosis not present

## 2014-12-23 DIAGNOSIS — L293 Anogenital pruritus, unspecified: Secondary | ICD-10-CM | POA: Diagnosis not present

## 2014-12-23 DIAGNOSIS — I2510 Atherosclerotic heart disease of native coronary artery without angina pectoris: Secondary | ICD-10-CM | POA: Diagnosis not present

## 2014-12-23 DIAGNOSIS — L292 Pruritus vulvae: Secondary | ICD-10-CM

## 2014-12-23 LAB — WET PREP FOR TRICH, YEAST, CLUE
CLUE CELLS WET PREP: NONE SEEN
TRICH WET PREP: NONE SEEN
WBC WET PREP: NONE SEEN
Yeast Wet Prep HPF POC: NONE SEEN

## 2014-12-23 MED ORDER — CLOBETASOL PROPIONATE 0.05 % EX CREA: TOPICAL_CREAM | CUTANEOUS | Status: DC

## 2014-12-23 NOTE — Progress Notes (Signed)
    Patient is a 79 year old who states since July 4 she's been treated for yeast infections proximally 3 times. She had been on a nystatin at one time then Diflucan and then Terazol 7 and continues to experience vulvar pruritus. She does suffer from vaginal atrophy and is currently on vaginal estrogen twice a week. Patient is not sexually active. She denies any GU or GI complaints.  Exam: Blood pressure 136/80 Gen. appearance well-developed nourished female with the above mentioned complaint Abdomen: Soft nontender no rebound or guarding Pelvic: Atrophic changes noted dryness and irritated areas Vagina: Atrophic changes Cervix: No lesions or discharge Bimanual exam not done Rectal exam: Not done  Wet prep negative  Assessment/plan patient: Patient with chronic vulvar this probably attributed to dryness and irritation. I'm going to ask her to apply the vaginal estrogen not only intravaginally twice a week but also externally. Once a week ago would like her to apply the clobetasol 0.05% externally. For this first week she'll apply the clobetasol twice a day for one week and then begin applying it once a week in combination with the estrogen as described.

## 2014-12-23 NOTE — Patient Instructions (Signed)
Clobetasol Propionate skin cream What is this medicine? CLOBETASOL (kloe BAY ta sol) is a corticosteroid. It is used on the skin to treat itching, redness, and swelling caused by some skin conditions. This medicine may be used for other purposes; ask your health care provider or pharmacist if you have questions. COMMON BRAND NAME(S): Cormax, Embeline, Embeline E, Temovate, Temovate E What should I tell my health care provider before I take this medicine? They need to know if you have any of these conditions: -any type of active infection including measles, tuberculosis, herpes, or chickenpox -circulation problems or vascular disease -large areas of burned or damaged skin -rosacea -skin wasting or thinning -an unusual or allergic reaction to clobetasol, corticosteroids, other medicines, foods, dyes, or preservatives -pregnant or trying to get pregnant -breast-feeding How should I use this medicine? This medicine is for external use only. Do not take by mouth. Follow the directions on the prescription label. Wash your hands before and after use. Apply a thin film of medicine to the affected area. Do not cover with a bandage or dressing unless your doctor or health care professional tells you to. Do not get this medicine in your eyes. If you do, rinse out with plenty of cool tap water. It is important not to use more medicine than prescribed. Do not use your medicine more often than directed. To do so may increase the chance of side effects. Talk to your pediatrician regarding the use of this medicine in children. Special care may be needed. Elderly patients are more likely to have damaged skin through aging, and this may increase side effects. This medicine should only be used for brief periods and infrequently in older patients. Overdosage: If you think you have taken too much of this medicine contact a poison control center or emergency room at once. NOTE: This medicine is only for you. Do not  share this medicine with others. What if I miss a dose? If you miss a dose, use it as soon as you can. If it is almost time for your next dose, use only that dose. Do not use double or extra doses. What may interact with this medicine? Interactions are not expected. Do not use cosmetics or other skin care products on the treated area. This list may not describe all possible interactions. Give your health care provider a list of all the medicines, herbs, non-prescription drugs, or dietary supplements you use. Also tell them if you smoke, drink alcohol, or use illegal drugs. Some items may interact with your medicine. What should I watch for while using this medicine? Tell your doctor or health care professional if your symptoms do not get better within 2 weeks, or if you develop skin irritation from the medicine. Tell your doctor or health care professional if you are exposed to anyone with measles or chickenpox, or if you develop sores or blisters that do not heal properly. What side effects may I notice from receiving this medicine? Side effects that you should report to your doctor or health care professional as soon as possible: -allergic reactions like skin rash, itching or hives, swelling of the face, lips, or tongue -changes in vision -lack of healing of the skin condition -painful, red, pus filled blisters on the skin or in hair follicles -thinning of the skin with easy bruising Side effects that usually do not require medical attention (report to your doctor or health care professional if they continue or are bothersome): -burning, irritation of the skin -redness or  scaling of the skin This list may not describe all possible side effects. Call your doctor for medical advice about side effects. You may report side effects to FDA at 1-800-FDA-1088. Where should I keep my medicine? Keep out of the reach of children. Store at room temperature between 15 and 30 degrees C (59 and 86 degrees  F). Keep away from heat and direct light. Do not freeze. Throw away any unused medicine after the expiration date. NOTE: This sheet is a summary. It may not cover all possible information. If you have questions about this medicine, talk to your doctor, pharmacist, or health care provider.  2015, [employer]. (2007-07-24 16:56:45)

## 2014-12-24 ENCOUNTER — Other Ambulatory Visit: Payer: Self-pay | Admitting: Internal Medicine

## 2014-12-24 DIAGNOSIS — J301 Allergic rhinitis due to pollen: Secondary | ICD-10-CM | POA: Diagnosis not present

## 2014-12-24 DIAGNOSIS — J3089 Other allergic rhinitis: Secondary | ICD-10-CM | POA: Diagnosis not present

## 2014-12-25 NOTE — Telephone Encounter (Signed)
Sent to pharmacy 

## 2014-12-31 DIAGNOSIS — J3089 Other allergic rhinitis: Secondary | ICD-10-CM | POA: Diagnosis not present

## 2014-12-31 DIAGNOSIS — J301 Allergic rhinitis due to pollen: Secondary | ICD-10-CM | POA: Diagnosis not present

## 2015-01-07 DIAGNOSIS — J3081 Allergic rhinitis due to animal (cat) (dog) hair and dander: Secondary | ICD-10-CM | POA: Diagnosis not present

## 2015-01-07 DIAGNOSIS — J3089 Other allergic rhinitis: Secondary | ICD-10-CM | POA: Diagnosis not present

## 2015-01-07 DIAGNOSIS — J301 Allergic rhinitis due to pollen: Secondary | ICD-10-CM | POA: Diagnosis not present

## 2015-01-12 ENCOUNTER — Telehealth (HOSPITAL_COMMUNITY): Payer: Self-pay

## 2015-01-12 NOTE — Telephone Encounter (Signed)
Encounter complete. 

## 2015-01-13 ENCOUNTER — Telehealth (HOSPITAL_COMMUNITY): Payer: Self-pay

## 2015-01-13 DIAGNOSIS — H2513 Age-related nuclear cataract, bilateral: Secondary | ICD-10-CM | POA: Diagnosis not present

## 2015-01-13 DIAGNOSIS — H40013 Open angle with borderline findings, low risk, bilateral: Secondary | ICD-10-CM | POA: Diagnosis not present

## 2015-01-13 NOTE — Telephone Encounter (Signed)
Encounter complete. 

## 2015-01-14 ENCOUNTER — Ambulatory Visit (HOSPITAL_COMMUNITY)
Admission: RE | Admit: 2015-01-14 | Discharge: 2015-01-14 | Disposition: A | Discharge status: Home / Self Care | Payer: Medicare Other | Source: Ambulatory Visit | Attending: Cardiology | Admitting: Cardiology

## 2015-01-14 DIAGNOSIS — J301 Allergic rhinitis due to pollen: Secondary | ICD-10-CM | POA: Diagnosis not present

## 2015-01-14 DIAGNOSIS — R0602 Shortness of breath: Secondary | ICD-10-CM | POA: Insufficient documentation

## 2015-01-14 DIAGNOSIS — J3089 Other allergic rhinitis: Secondary | ICD-10-CM | POA: Diagnosis not present

## 2015-01-15 LAB — EXERCISE TOLERANCE TEST
CHL CUP MPHR: 143 {beats}/min
CHL CUP RESTING HR STRESS: 88 {beats}/min
CSEPED: 7 min
Estimated workload: 8.8 METS
Exercise duration (sec): 11 s
Peak HR: 164 {beats}/min
Percent HR: 114 %
RPE: 16

## 2015-01-21 DIAGNOSIS — J301 Allergic rhinitis due to pollen: Secondary | ICD-10-CM | POA: Diagnosis not present

## 2015-01-21 DIAGNOSIS — J3089 Other allergic rhinitis: Secondary | ICD-10-CM | POA: Diagnosis not present

## 2015-01-28 ENCOUNTER — Other Ambulatory Visit: Payer: Self-pay | Admitting: Internal Medicine

## 2015-01-28 DIAGNOSIS — J301 Allergic rhinitis due to pollen: Secondary | ICD-10-CM | POA: Diagnosis not present

## 2015-01-28 NOTE — Telephone Encounter (Signed)
Faxed to pharmacy

## 2015-01-29 DIAGNOSIS — J301 Allergic rhinitis due to pollen: Secondary | ICD-10-CM | POA: Diagnosis not present

## 2015-01-29 DIAGNOSIS — J3089 Other allergic rhinitis: Secondary | ICD-10-CM | POA: Diagnosis not present

## 2015-02-04 DIAGNOSIS — J3089 Other allergic rhinitis: Secondary | ICD-10-CM | POA: Diagnosis not present

## 2015-02-04 DIAGNOSIS — J301 Allergic rhinitis due to pollen: Secondary | ICD-10-CM | POA: Diagnosis not present

## 2015-02-08 ENCOUNTER — Ambulatory Visit (INDEPENDENT_AMBULATORY_CARE_PROVIDER_SITE_OTHER): Payer: Medicare Other | Admitting: Internal Medicine

## 2015-02-08 ENCOUNTER — Encounter: Payer: Self-pay | Admitting: Internal Medicine

## 2015-02-08 VITALS — BP 120/60 | HR 86 | Temp 98.1°F | Resp 12 | Ht 63.0 in | Wt 106.4 lb

## 2015-02-08 DIAGNOSIS — I10 Essential (primary) hypertension: Secondary | ICD-10-CM

## 2015-02-08 DIAGNOSIS — I2510 Atherosclerotic heart disease of native coronary artery without angina pectoris: Secondary | ICD-10-CM

## 2015-02-08 DIAGNOSIS — F411 Generalized anxiety disorder: Secondary | ICD-10-CM

## 2015-02-08 DIAGNOSIS — K582 Mixed irritable bowel syndrome: Secondary | ICD-10-CM

## 2015-02-08 NOTE — Patient Instructions (Signed)
We will check into the colonoscopy and will get back with you.   Come back in about 6 months for the physical.   Keep up the good work with your health and try to take some time for yourself when you can.

## 2015-02-08 NOTE — Progress Notes (Signed)
Pre visit review using our clinic review tool, if applicable. No additional management support is needed unless otherwise documented below in the visit note. 

## 2015-02-09 NOTE — Progress Notes (Signed)
   Subjective:    Patient ID: [name] [name], female    DOB: Jun 08, 1935, 79 y.o.   MRN: [id number]  HPI The patient is a 79 YO female here for follow up of her medical problems including her hypertension (controlled on amlodipine 2.5 mg daily, not complicated), her anxiety (doing okay with xanax prn and trazodone for sleep as needed, working on relaxation and taking time for herself which has helped some), and her IBS (still having troubles with intermittent diarrhea and constipation, taking OTC medications which have helped, has modified her diet which has helped minimally, not complicated). No new concerns.   Review of Systems  Constitutional: Negative for fever, activity change, appetite change, fatigue and unexpected weight change.  HENT: Negative for congestion, ear discharge, ear pain, postnasal drip, sinus pressure, sore throat and voice change.   Respiratory: Negative for cough, chest tightness, shortness of breath and wheezing.   Cardiovascular: Negative for chest pain, palpitations and leg swelling.  Gastrointestinal: Negative for abdominal pain, diarrhea, constipation, blood in stool and abdominal distention.  Musculoskeletal: Positive for arthralgias. Negative for gait problem.       Rare  Skin: Negative.   Neurological: Negative for dizziness, weakness, light-headedness and headaches.  Psychiatric/Behavioral: Negative.       Objective:   Physical Exam  Constitutional: She is oriented to person, place, and time. She appears well-developed and well-nourished.  HENT:  Head: Normocephalic and atraumatic.  Eyes: EOM are normal.  Neck: Normal range of motion.  Cardiovascular: Normal rate and regular rhythm.   Pulmonary/Chest: Effort normal and breath sounds normal. No respiratory distress. She has no wheezes. She has no rales.  Abdominal: Soft. Bowel sounds are normal. She exhibits no distension. There is no tenderness. There is no rebound.  Neurological: She is alert and  oriented to person, place, and time. Coordination normal.  Skin: Skin is warm and dry.   Filed Vitals:   02/08/15 0930  BP: 120/60  Pulse: 86  Temp: 98.1 F (36.7 C)  TempSrc: Oral  Resp: 12  Height: 5\' 3"  (1.6 m)  Weight: 106 lb 6.4 oz (48.263 kg)  SpO2: 96%      Assessment & Plan:

## 2015-02-09 NOTE — Assessment & Plan Note (Signed)
Takes librax prn (rare) and using some OTC medications for this. Has tried to modify her diet with foods that tend to bother her stomach with only limited success. Has had prior GI workup for other causes.

## 2015-02-09 NOTE — Assessment & Plan Note (Signed)
Controlled on her amlodipine 2.5 mg daily, reviewed recent BMP and no indication for change today.

## 2015-02-09 NOTE — Assessment & Plan Note (Signed)
Doing well and rare xanax usage (she uses sometimes for sleep). Also uses trazodone most of the time for sleep. Taking more time for herself which has helped.

## 2015-02-11 DIAGNOSIS — J301 Allergic rhinitis due to pollen: Secondary | ICD-10-CM | POA: Diagnosis not present

## 2015-02-11 DIAGNOSIS — J3089 Other allergic rhinitis: Secondary | ICD-10-CM | POA: Diagnosis not present

## 2015-02-18 DIAGNOSIS — J3089 Other allergic rhinitis: Secondary | ICD-10-CM | POA: Diagnosis not present

## 2015-02-18 DIAGNOSIS — J301 Allergic rhinitis due to pollen: Secondary | ICD-10-CM | POA: Diagnosis not present

## 2015-02-25 ENCOUNTER — Telehealth: Payer: Self-pay | Admitting: Internal Medicine

## 2015-02-25 ENCOUNTER — Other Ambulatory Visit: Payer: Self-pay | Admitting: Geriatric Medicine

## 2015-02-25 DIAGNOSIS — J3089 Other allergic rhinitis: Secondary | ICD-10-CM | POA: Diagnosis not present

## 2015-02-25 DIAGNOSIS — J301 Allergic rhinitis due to pollen: Secondary | ICD-10-CM | POA: Diagnosis not present

## 2015-02-25 NOTE — Telephone Encounter (Signed)
Needs generic for librax sent to [facility]

## 2015-02-26 MED ORDER — CILIDINIUM-CHLORDIAZEPOXIDE 2.5-5 MG PO CAPS: 1.0000 | ORAL_CAPSULE | Freq: Three times a day (TID) | ORAL | Status: DC | PRN

## 2015-02-26 NOTE — Telephone Encounter (Signed)
Didn't see this on her med list. Please advise, thanks.

## 2015-02-26 NOTE — Telephone Encounter (Signed)
Printed and signed.  

## 2015-02-26 NOTE — Telephone Encounter (Signed)
Faxed to pharmacy

## 2015-03-04 DIAGNOSIS — J3089 Other allergic rhinitis: Secondary | ICD-10-CM | POA: Diagnosis not present

## 2015-03-04 DIAGNOSIS — J301 Allergic rhinitis due to pollen: Secondary | ICD-10-CM | POA: Diagnosis not present

## 2015-03-11 DIAGNOSIS — J301 Allergic rhinitis due to pollen: Secondary | ICD-10-CM | POA: Diagnosis not present

## 2015-03-11 DIAGNOSIS — J3089 Other allergic rhinitis: Secondary | ICD-10-CM | POA: Diagnosis not present

## 2015-03-18 DIAGNOSIS — J3089 Other allergic rhinitis: Secondary | ICD-10-CM | POA: Diagnosis not present

## 2015-03-18 DIAGNOSIS — J301 Allergic rhinitis due to pollen: Secondary | ICD-10-CM | POA: Diagnosis not present

## 2015-03-24 DIAGNOSIS — J301 Allergic rhinitis due to pollen: Secondary | ICD-10-CM | POA: Diagnosis not present

## 2015-03-24 DIAGNOSIS — J3089 Other allergic rhinitis: Secondary | ICD-10-CM | POA: Diagnosis not present

## 2015-04-01 DIAGNOSIS — J301 Allergic rhinitis due to pollen: Secondary | ICD-10-CM | POA: Diagnosis not present

## 2015-04-01 DIAGNOSIS — J3089 Other allergic rhinitis: Secondary | ICD-10-CM | POA: Diagnosis not present

## 2015-04-08 DIAGNOSIS — J3089 Other allergic rhinitis: Secondary | ICD-10-CM | POA: Diagnosis not present

## 2015-04-08 DIAGNOSIS — J301 Allergic rhinitis due to pollen: Secondary | ICD-10-CM | POA: Diagnosis not present

## 2015-04-14 DIAGNOSIS — J301 Allergic rhinitis due to pollen: Secondary | ICD-10-CM | POA: Diagnosis not present

## 2015-04-14 DIAGNOSIS — J3089 Other allergic rhinitis: Secondary | ICD-10-CM | POA: Diagnosis not present

## 2015-04-22 DIAGNOSIS — J301 Allergic rhinitis due to pollen: Secondary | ICD-10-CM | POA: Diagnosis not present

## 2015-04-22 DIAGNOSIS — J3089 Other allergic rhinitis: Secondary | ICD-10-CM | POA: Diagnosis not present

## 2015-04-29 DIAGNOSIS — J3089 Other allergic rhinitis: Secondary | ICD-10-CM | POA: Diagnosis not present

## 2015-04-29 DIAGNOSIS — J301 Allergic rhinitis due to pollen: Secondary | ICD-10-CM | POA: Diagnosis not present

## 2015-05-06 DIAGNOSIS — J3089 Other allergic rhinitis: Secondary | ICD-10-CM | POA: Diagnosis not present

## 2015-05-06 DIAGNOSIS — J301 Allergic rhinitis due to pollen: Secondary | ICD-10-CM | POA: Diagnosis not present

## 2015-05-13 DIAGNOSIS — J301 Allergic rhinitis due to pollen: Secondary | ICD-10-CM | POA: Diagnosis not present

## 2015-05-13 DIAGNOSIS — J3089 Other allergic rhinitis: Secondary | ICD-10-CM | POA: Diagnosis not present

## 2015-05-17 DIAGNOSIS — J3081 Allergic rhinitis due to animal (cat) (dog) hair and dander: Secondary | ICD-10-CM | POA: Diagnosis not present

## 2015-05-17 DIAGNOSIS — H1045 Other chronic allergic conjunctivitis: Secondary | ICD-10-CM | POA: Diagnosis not present

## 2015-05-17 DIAGNOSIS — J3089 Other allergic rhinitis: Secondary | ICD-10-CM | POA: Diagnosis not present

## 2015-05-17 DIAGNOSIS — J301 Allergic rhinitis due to pollen: Secondary | ICD-10-CM | POA: Diagnosis not present

## 2015-05-20 DIAGNOSIS — J3089 Other allergic rhinitis: Secondary | ICD-10-CM | POA: Diagnosis not present

## 2015-05-20 DIAGNOSIS — J301 Allergic rhinitis due to pollen: Secondary | ICD-10-CM | POA: Diagnosis not present

## 2015-05-27 DIAGNOSIS — J301 Allergic rhinitis due to pollen: Secondary | ICD-10-CM | POA: Diagnosis not present

## 2015-05-27 DIAGNOSIS — J3089 Other allergic rhinitis: Secondary | ICD-10-CM | POA: Diagnosis not present

## 2015-05-31 ENCOUNTER — Other Ambulatory Visit: Payer: Self-pay | Admitting: Internal Medicine

## 2015-05-31 DIAGNOSIS — Z1231 Encounter for screening mammogram for malignant neoplasm of breast: Secondary | ICD-10-CM | POA: Diagnosis not present

## 2015-05-31 NOTE — Telephone Encounter (Signed)
rx faxed

## 2015-06-01 ENCOUNTER — Encounter: Payer: Self-pay | Admitting: Gynecology

## 2015-06-01 DIAGNOSIS — L570 Actinic keratosis: Secondary | ICD-10-CM | POA: Diagnosis not present

## 2015-06-01 DIAGNOSIS — L821 Other seborrheic keratosis: Secondary | ICD-10-CM | POA: Diagnosis not present

## 2015-06-01 DIAGNOSIS — L814 Other melanin hyperpigmentation: Secondary | ICD-10-CM | POA: Diagnosis not present

## 2015-06-01 DIAGNOSIS — L565 Disseminated superficial actinic porokeratosis (DSAP): Secondary | ICD-10-CM | POA: Diagnosis not present

## 2015-06-03 DIAGNOSIS — J3089 Other allergic rhinitis: Secondary | ICD-10-CM | POA: Diagnosis not present

## 2015-06-03 DIAGNOSIS — J301 Allergic rhinitis due to pollen: Secondary | ICD-10-CM | POA: Diagnosis not present

## 2015-06-10 DIAGNOSIS — J301 Allergic rhinitis due to pollen: Secondary | ICD-10-CM | POA: Diagnosis not present

## 2015-06-10 DIAGNOSIS — J3089 Other allergic rhinitis: Secondary | ICD-10-CM | POA: Diagnosis not present

## 2015-06-15 ENCOUNTER — Other Ambulatory Visit: Payer: Self-pay | Admitting: Internal Medicine

## 2015-06-17 DIAGNOSIS — J3089 Other allergic rhinitis: Secondary | ICD-10-CM | POA: Diagnosis not present

## 2015-06-17 DIAGNOSIS — J301 Allergic rhinitis due to pollen: Secondary | ICD-10-CM | POA: Diagnosis not present

## 2015-06-22 DIAGNOSIS — J3081 Allergic rhinitis due to animal (cat) (dog) hair and dander: Secondary | ICD-10-CM | POA: Diagnosis not present

## 2015-06-22 DIAGNOSIS — J3089 Other allergic rhinitis: Secondary | ICD-10-CM | POA: Diagnosis not present

## 2015-06-22 DIAGNOSIS — J019 Acute sinusitis, unspecified: Secondary | ICD-10-CM | POA: Diagnosis not present

## 2015-06-22 DIAGNOSIS — J301 Allergic rhinitis due to pollen: Secondary | ICD-10-CM | POA: Diagnosis not present

## 2015-06-28 ENCOUNTER — Other Ambulatory Visit: Payer: Self-pay | Admitting: Internal Medicine

## 2015-06-29 DIAGNOSIS — J3089 Other allergic rhinitis: Secondary | ICD-10-CM | POA: Diagnosis not present

## 2015-06-29 DIAGNOSIS — J301 Allergic rhinitis due to pollen: Secondary | ICD-10-CM | POA: Diagnosis not present

## 2015-07-08 DIAGNOSIS — J301 Allergic rhinitis due to pollen: Secondary | ICD-10-CM | POA: Diagnosis not present

## 2015-07-08 DIAGNOSIS — J3089 Other allergic rhinitis: Secondary | ICD-10-CM | POA: Diagnosis not present

## 2015-07-14 DIAGNOSIS — H2513 Age-related nuclear cataract, bilateral: Secondary | ICD-10-CM | POA: Diagnosis not present

## 2015-07-15 DIAGNOSIS — J3089 Other allergic rhinitis: Secondary | ICD-10-CM | POA: Diagnosis not present

## 2015-07-15 DIAGNOSIS — J301 Allergic rhinitis due to pollen: Secondary | ICD-10-CM | POA: Diagnosis not present

## 2015-07-22 DIAGNOSIS — J301 Allergic rhinitis due to pollen: Secondary | ICD-10-CM | POA: Diagnosis not present

## 2015-07-22 DIAGNOSIS — J3089 Other allergic rhinitis: Secondary | ICD-10-CM | POA: Diagnosis not present

## 2015-07-29 DIAGNOSIS — J3089 Other allergic rhinitis: Secondary | ICD-10-CM | POA: Diagnosis not present

## 2015-07-29 DIAGNOSIS — J301 Allergic rhinitis due to pollen: Secondary | ICD-10-CM | POA: Diagnosis not present

## 2015-08-02 ENCOUNTER — Encounter: Payer: Medicare Other | Admitting: Gynecology

## 2015-08-05 DIAGNOSIS — J301 Allergic rhinitis due to pollen: Secondary | ICD-10-CM | POA: Diagnosis not present

## 2015-08-05 DIAGNOSIS — J3089 Other allergic rhinitis: Secondary | ICD-10-CM | POA: Diagnosis not present

## 2015-08-09 ENCOUNTER — Encounter: Payer: Medicare Other | Admitting: Gynecology

## 2015-08-11 ENCOUNTER — Other Ambulatory Visit (INDEPENDENT_AMBULATORY_CARE_PROVIDER_SITE_OTHER): Payer: Medicare Other

## 2015-08-11 ENCOUNTER — Encounter: Payer: Self-pay | Admitting: Internal Medicine

## 2015-08-11 ENCOUNTER — Ambulatory Visit (INDEPENDENT_AMBULATORY_CARE_PROVIDER_SITE_OTHER): Payer: Medicare Other | Admitting: Internal Medicine

## 2015-08-11 VITALS — BP 118/80 | HR 102 | Temp 97.9°F | Resp 14 | Ht 60.5 in | Wt 102.0 lb

## 2015-08-11 DIAGNOSIS — E789 Disorder of lipoprotein metabolism, unspecified: Secondary | ICD-10-CM

## 2015-08-11 DIAGNOSIS — I10 Essential (primary) hypertension: Secondary | ICD-10-CM | POA: Diagnosis not present

## 2015-08-11 DIAGNOSIS — Z0000 Encounter for general adult medical examination without abnormal findings: Secondary | ICD-10-CM | POA: Diagnosis not present

## 2015-08-11 LAB — LIPID PANEL
CHOLESTEROL: 218 mg/dL — AB (ref 0–200)
HDL: 112.8 mg/dL (ref >39.00)
LDL Cholesterol: 80 mg/dL (ref 0–99)
NONHDL: 104.91
Total CHOL/HDL Ratio: 2
Triglycerides: 126 mg/dL (ref 0.0–149.0)
VLDL: 25.2 mg/dL (ref 0.0–40.0)

## 2015-08-11 LAB — CBC
HEMATOCRIT: 45.9 % (ref 36.0–46.0)
HEMOGLOBIN: 15.7 g/dL — AB (ref 12.0–15.0)
MCHC: 34.2 g/dL (ref 30.0–36.0)
MCV: 101.8 fl — ABNORMAL HIGH (ref 78.0–100.0)
Platelets: 241 10*3/uL (ref 150.0–400.0)
RBC: 4.5 Mil/uL (ref 3.87–5.11)
RDW: 12.9 % (ref 11.5–15.5)
WBC: 6.3 10*3/uL (ref 4.0–10.5)

## 2015-08-11 LAB — COMPREHENSIVE METABOLIC PANEL
ALBUMIN: 4.6 g/dL (ref 3.5–5.2)
ALK PHOS: 66 U/L (ref 39–117)
ALT: 20 U/L (ref 0–35)
AST: 28 U/L (ref 0–37)
BUN: 12 mg/dL (ref 6–23)
CO2: 30 mEq/L (ref 19–32)
CREATININE: 0.77 mg/dL (ref 0.40–1.20)
Calcium: 10.2 mg/dL (ref 8.4–10.5)
Chloride: 100 mEq/L (ref 96–112)
GFR: 77.01 mL/min (ref >60.00)
GLUCOSE: 86 mg/dL (ref 70–99)
Potassium: 4.2 mEq/L (ref 3.5–5.1)
SODIUM: 139 meq/L (ref 135–145)
TOTAL PROTEIN: 7.7 g/dL (ref 6.0–8.3)
Total Bilirubin: 0.5 mg/dL (ref 0.2–1.2)

## 2015-08-11 MED ORDER — TRAZODONE HCL 100 MG PO TABS: ORAL_TABLET | ORAL | Status: DC

## 2015-08-11 MED ORDER — AMLODIPINE BESYLATE 2.5 MG PO TABS: 2.5000 mg | ORAL_TABLET | Freq: Every day | ORAL | Status: DC

## 2015-08-11 MED ORDER — ALPRAZOLAM 0.5 MG PO TABS: ORAL_TABLET | ORAL | Status: DC

## 2015-08-11 NOTE — Progress Notes (Signed)
Pre visit review using our clinic review tool, if applicable. No additional management support is needed unless otherwise documented below in the visit note. 

## 2015-08-11 NOTE — Progress Notes (Signed)
   Subjective:    Patient ID: [name] [name], female    DOB: 1935/06/21, 80 y.o.   MRN: [id number]  HPI Here for medicare wellness, no new complaints. Please see A/P for status and treatment of chronic medical problems.   Diet: heart healthy  Physical activity: active Depression/mood screen: negative Hearing: intact to whispered voice Visual acuity: grossly normal with lens, performs annual eye exam  ADLs: capable Fall risk: none Home safety: good Cognitive evaluation: intact to orientation, naming, recall and repetition EOL planning: adv directives discussed  I have personally reviewed and have noted 1. The patient's medical and social history - reviewed today no changes 2. Their use of alcohol, tobacco or illicit drugs 3. Their current medications and supplements 4. The patient's functional ability including ADL's, fall risks, home safety risks and hearing or visual impairment. 5. Diet and physical activities 6. Evidence for depression or mood disorders 7. Care team reviewed and updated (available in snapshot)  Review of Systems  Constitutional: Negative for fever, activity change, appetite change, fatigue and unexpected weight change.  HENT: Negative for congestion, ear discharge, ear pain, postnasal drip, sinus pressure, sore throat and voice change.   Respiratory: Negative for cough, chest tightness, shortness of breath and wheezing.   Cardiovascular: Negative for chest pain, palpitations and leg swelling.  Gastrointestinal: Negative for abdominal pain, diarrhea, constipation, blood in stool and abdominal distention.  Musculoskeletal: Positive for arthralgias. Negative for gait problem.       Rare  Skin: Negative.   Neurological: Negative for dizziness, weakness, light-headedness and headaches.  Psychiatric/Behavioral: Negative.       Objective:   Physical Exam  Constitutional: She is oriented to person, place, and time. She appears well-developed and well-nourished.    HENT:  Head: Normocephalic and atraumatic.  Eyes: EOM are normal.  Neck: Normal range of motion.  Cardiovascular: Normal rate and regular rhythm.   Pulmonary/Chest: Effort normal and breath sounds normal. No respiratory distress. She has no wheezes. She has no rales.  Abdominal: Soft. Bowel sounds are normal. She exhibits no distension. There is no tenderness. There is no rebound.  Musculoskeletal: She exhibits no edema.  Neurological: She is alert and oriented to person, place, and time. Coordination normal.  Skin: Skin is warm and dry.   Filed Vitals:   08/11/15 0805  BP: 118/80  Pulse: 102  Temp: 97.9 F (36.6 C)  TempSrc: Oral  Resp: 14  Height: 5' 0.5" (1.537 m)  Weight: 102 lb (46.267 kg)  SpO2: 97%      Assessment & Plan:

## 2015-08-11 NOTE — Patient Instructions (Signed)
We will check the lab work today and send you a copy of the results.   We have sent in the refills and you do not need any shots today.   I will keep you and [name] [name] in my thoughts.   Health Maintenance, Female Adopting a healthy lifestyle and getting preventive care can go a long way to promote health and wellness. Talk with your health care provider about what schedule of regular examinations is right for you. This is a good chance for you to check in with your provider about disease prevention and staying healthy. In between checkups, there are plenty of things you can do on your own. Experts have done a lot of research about which lifestyle changes and preventive measures are most likely to keep you healthy. Ask your health care provider for more information. WEIGHT AND DIET  Eat a healthy diet  Be sure to include plenty of vegetables, fruits, low-fat dairy products, and lean protein.  Do not eat a lot of foods high in solid fats, added sugars, or salt.  Get regular exercise. This is one of the most important things you can do for your health.  Most adults should exercise for at least 150 minutes each week. The exercise should increase your heart rate and make you sweat (moderate-intensity exercise).  Most adults should also do strengthening exercises at least twice a week. This is in addition to the moderate-intensity exercise.  Maintain a healthy weight  Body mass index (BMI) is a measurement that can be used to identify possible weight problems. It estimates body fat based on height and weight. Your health care provider can help determine your BMI and help you achieve or maintain a healthy weight.  For females 3 years of age and older:   A BMI below 18.5 is considered underweight.  A BMI of 18.5 to 24.9 is normal.  A BMI of 25 to 29.9 is considered overweight.  A BMI of 30 and above is considered obese.  Watch levels of cholesterol and blood lipids  You should start having  your blood tested for lipids and cholesterol at 78 years of age, then have this test every 5 years.  You may need to have your cholesterol levels checked more often if:  Your lipid or cholesterol levels are high.  You are older than 78 years of age.  You are at high risk for heart disease.  CANCER SCREENING   Lung Cancer  Lung cancer screening is recommended for adults 42-61 years old who are at high risk for lung cancer because of a history of smoking.  A yearly low-dose CT scan of the lungs is recommended for people who:  Currently smoke.  Have quit within the past 15 years.  Have at least a 30-pack-year history of smoking. A pack year is smoking an average of one pack of cigarettes a day for 1 year.  Yearly screening should continue until it has been 15 years since you quit.  Yearly screening should stop if you develop a health problem that would prevent you from having lung cancer treatment.  Breast Cancer  Practice breast self-awareness. This means understanding how your breasts normally appear and feel.  It also means doing regular breast self-exams. Let your health care provider know about any changes, no matter how small.  If you are in your 20s or 30s, you should have a clinical breast exam (CBE) by a health care provider every 1-3 years as part of a regular health  exam.  If you are 40 or older, have a CBE every year. Also consider having a breast X-ray (mammogram) every year.  If you have a family history of breast cancer, talk to your health care provider about genetic screening.  If you are at high risk for breast cancer, talk to your health care provider about having an MRI and a mammogram every year.  Breast cancer gene (BRCA) assessment is recommended for women who have family members with BRCA-related cancers. BRCA-related cancers include:  Breast.  Ovarian.  Tubal.  Peritoneal cancers.  Results of the assessment will determine the need for genetic  counseling and BRCA1 and BRCA2 testing. Cervical Cancer Your health care provider may recommend that you be screened regularly for cancer of the pelvic organs (ovaries, uterus, and vagina). This screening involves a pelvic examination, including checking for microscopic changes to the surface of your cervix (Pap test). You may be encouraged to have this screening done every 3 years, beginning at age 48.  For women ages 25-65, health care providers may recommend pelvic exams and Pap testing every 3 years, or they may recommend the Pap and pelvic exam, combined with testing for human papilloma virus (HPV), every 5 years. Some types of HPV increase your risk of cervical cancer. Testing for HPV may also be done on women of any age with unclear Pap test results.  Other health care providers may not recommend any screening for nonpregnant women who are considered low risk for pelvic cancer and who do not have symptoms. Ask your health care provider if a screening pelvic exam is right for you.  If you have had past treatment for cervical cancer or a condition that could lead to cancer, you need Pap tests and screening for cancer for at least 20 years after your treatment. If Pap tests have been discontinued, your risk factors (such as having a new sexual partner) need to be reassessed to determine if screening should resume. Some women have medical problems that increase the chance of getting cervical cancer. In these cases, your health care provider may recommend more frequent screening and Pap tests. Colorectal Cancer  This type of cancer can be detected and often prevented.  Routine colorectal cancer screening usually begins at 78 years of age and continues through 78 years of age.  Your health care provider may recommend screening at an earlier age if you have risk factors for colon cancer.  Your health care provider may also recommend using home test kits to check for hidden blood in the stool.  A  small camera at the end of a tube can be used to examine your colon directly (sigmoidoscopy or colonoscopy). This is done to check for the earliest forms of colorectal cancer.  Routine screening usually begins at age 33.  Direct examination of the colon should be repeated every 5-10 years through 78 years of age. However, you may need to be screened more often if early forms of precancerous polyps or small growths are found. Skin Cancer  Check your skin from head to toe regularly.  Tell your health care provider about any new moles or changes in moles, especially if there is a change in a mole's shape or color.  Also tell your health care provider if you have a mole that is larger than the size of a pencil eraser.  Always use sunscreen. Apply sunscreen liberally and repeatedly throughout the day.  Protect yourself by wearing long sleeves, pants, a wide-brimmed hat, and sunglasses  whenever you are outside. HEART DISEASE, DIABETES, AND HIGH BLOOD PRESSURE   High blood pressure causes heart disease and increases the risk of stroke. High blood pressure is more likely to develop in:  People who have blood pressure in the high end of the normal range (130-139/85-89 mm Hg).  People who are overweight or obese.  People who are African American.  If you are 94-23 years of age, have your blood pressure checked every 3-5 years. If you are 60 years of age or older, have your blood pressure checked every year. You should have your blood pressure measured twice--once when you are at a hospital or clinic, and once when you are not at a hospital or clinic. Record the average of the two measurements. To check your blood pressure when you are not at a hospital or clinic, you can use:  An automated blood pressure machine at a pharmacy.  A home blood pressure monitor.  If you are between 94 years and 109 years old, ask your health care provider if you should take aspirin to prevent strokes.  Have  regular diabetes screenings. This involves taking a blood sample to check your fasting blood sugar level.  If you are at a normal weight and have a low risk for diabetes, have this test once every three years after 78 years of age.  If you are overweight and have a high risk for diabetes, consider being tested at a younger age or more often. PREVENTING INFECTION  Hepatitis B  If you have a higher risk for hepatitis B, you should be screened for this virus. You are considered at high risk for hepatitis B if:  You were born in a country where hepatitis B is common. Ask your health care provider which countries are considered high risk.  Your parents were born in a high-risk country, and you have not been immunized against hepatitis B (hepatitis B vaccine).  You have HIV or AIDS.  You use needles to inject street drugs.  You live with someone who has hepatitis B.  You have had sex with someone who has hepatitis B.  You get hemodialysis treatment.  You take certain medicines for conditions, including cancer, organ transplantation, and autoimmune conditions. Hepatitis C  Blood testing is recommended for:  Everyone born from [birth year] through 1963.  Anyone with known risk factors for hepatitis C. Sexually transmitted infections (STIs)  You should be screened for sexually transmitted infections (STIs) including gonorrhea and chlamydia if:  You are sexually active and are younger than 78 years of age.  You are older than 78 years of age and your health care provider tells you that you are at risk for this type of infection.  Your sexual activity has changed since you were last screened and you are at an increased risk for chlamydia or gonorrhea. Ask your health care provider if you are at risk.  If you do not have HIV, but are at risk, it may be recommended that you take a prescription medicine daily to prevent HIV infection. This is called pre-exposure prophylaxis (PrEP). You are  considered at risk if:  You are sexually active and do not regularly use condoms or know the HIV status of your partner(s).  You take drugs by injection.  You are sexually active with a partner who has HIV. Talk with your health care provider about whether you are at high risk of being infected with HIV. If you choose to begin PrEP, you should first be  tested for HIV. You should then be tested every 3 months for as long as you are taking PrEP.  PREGNANCY   If you are premenopausal and you may become pregnant, ask your health care provider about preconception counseling.  If you may become pregnant, take 400 to 800 micrograms (mcg) of folic acid every day.  If you want to prevent pregnancy, talk to your health care provider about birth control (contraception). OSTEOPOROSIS AND MENOPAUSE   Osteoporosis is a disease in which the bones lose minerals and strength with aging. This can result in serious bone fractures. Your risk for osteoporosis can be identified using a bone density scan.  If you are 56 years of age or older, or if you are at risk for osteoporosis and fractures, ask your health care provider if you should be screened.  Ask your health care provider whether you should take a calcium or vitamin D supplement to lower your risk for osteoporosis.  Menopause may have certain physical symptoms and risks.  Hormone replacement therapy may reduce some of these symptoms and risks. Talk to your health care provider about whether hormone replacement therapy is right for you.  HOME CARE INSTRUCTIONS   Schedule regular health, dental, and eye exams.  Stay current with your immunizations.   Do not use any tobacco products including cigarettes, chewing tobacco, or electronic cigarettes.  If you are pregnant, do not drink alcohol.  If you are breastfeeding, limit how much and how often you drink alcohol.  Limit alcohol intake to no more than 1 drink per day for nonpregnant women. One  drink equals 12 ounces of beer, 5 ounces of wine, or 1 ounces of hard liquor.  Do not use street drugs.  Do not share needles.  Ask your health care provider for help if you need support or information about quitting drugs.  Tell your health care provider if you often feel depressed.  Tell your health care provider if you have ever been abused or do not feel safe at home.   This information is not intended to replace advice given to you by your health care provider. Make sure you discuss any questions you have with your health care provider.   Document Released: 10/31/2010 Document Revised: 05/08/2014 Document Reviewed: 03/19/2013 Elsevier Interactive Patient Education [employer].

## 2015-08-11 NOTE — Assessment & Plan Note (Addendum)
Refills done, labs checked today. Given 10 year screening recommendations. Colonoscopy and mammogram up to date. Immunizations up to date and gets flu shot yearly. 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 Exercises daily and non-smoker.

## 2015-08-12 DIAGNOSIS — J3089 Other allergic rhinitis: Secondary | ICD-10-CM | POA: Diagnosis not present

## 2015-08-12 DIAGNOSIS — J301 Allergic rhinitis due to pollen: Secondary | ICD-10-CM | POA: Diagnosis not present

## 2015-08-19 DIAGNOSIS — J301 Allergic rhinitis due to pollen: Secondary | ICD-10-CM | POA: Diagnosis not present

## 2015-08-19 DIAGNOSIS — J3089 Other allergic rhinitis: Secondary | ICD-10-CM | POA: Diagnosis not present

## 2015-08-25 ENCOUNTER — Encounter: Payer: Self-pay | Admitting: Gynecology

## 2015-08-25 ENCOUNTER — Ambulatory Visit (INDEPENDENT_AMBULATORY_CARE_PROVIDER_SITE_OTHER): Payer: Medicare Other | Admitting: Gynecology

## 2015-08-25 VITALS — BP 134/78 | Ht 62.75 in | Wt 105.0 lb

## 2015-08-25 DIAGNOSIS — Z780 Asymptomatic menopausal state: Secondary | ICD-10-CM

## 2015-08-25 DIAGNOSIS — M8580 Other specified disorders of bone density and structure, unspecified site: Secondary | ICD-10-CM

## 2015-08-25 DIAGNOSIS — Z01419 Encounter for gynecological examination (general) (routine) without abnormal findings: Secondary | ICD-10-CM | POA: Diagnosis not present

## 2015-08-25 NOTE — Progress Notes (Signed)
[name] [name] 1936/03/16 [id number]   History:    80 y.o.  for annual gyn exam with no complaints today. Patient last year had some nonspecific vulvar irritation and was prescribed clobetasol which she took it cleared up her pruritus currently not taking. Patient is using vaginal estrogen twice a week for vaginal atrophy and has done well. Patient reports no vaginal bleeding.Review of patient's records indicated that in the past and she had had history of osteoporosis and had been on Fosamax for 5 years and discontinued in 2012 is currently on a drug holiday. Her last bone density study was in 2017 demonstrated her lowest T score was -1.8 at the [facility]. They had done a Frax analysis based on her hip score and her values were subthreshold. Patient is taking her calcium and vitamin D and does weightbearing exercises.Her new primary care physician is Dr. Lucie Leather who will be doing her blood work. Patient's last colonoscopy was in 2009 and patient reports past history of benign colon polyps. All her vaccines are up-to-date. She did have shingles as well as pneumonia early part of this year but has recovered well. Patient with no past history of any abnormal Pap smear.  Past medical history,surgical history, family history and social history were all reviewed and documented in the EPIC chart.  Gynecologic History No LMP recorded. Patient is postmenopausal. Contraception: post menopausal status Last Pap: Several years ago. Results were: normal Last mammogram: 2017. Results were: Normal but dense had three-dimensional mammogram  Obstetric History OB History  Gravida Para Term Preterm AB SAB TAB Ectopic Multiple Living  1 1 1   0     1    # Outcome Date GA Lbr Len/2nd Weight Sex Delivery Anes PTL Lv  1 Term                ROS: A ROS was performed and pertinent positives and negatives are included in the history.  GENERAL: No fevers or chills. HEENT: No change in vision, no earache,  sore throat or sinus congestion. NECK: No pain or stiffness. CARDIOVASCULAR: No chest pain or pressure. No palpitations. PULMONARY: No shortness of breath, cough or wheeze. GASTROINTESTINAL: No abdominal pain, nausea, vomiting or diarrhea, melena or bright red blood per rectum. GENITOURINARY: No urinary frequency, urgency, hesitancy or dysuria. MUSCULOSKELETAL: No joint or muscle pain, no back pain, no recent trauma. DERMATOLOGIC: No rash, no itching, no lesions. ENDOCRINE: No polyuria, polydipsia, no heat or cold intolerance. No recent change in weight. HEMATOLOGICAL: No anemia or easy bruising or bleeding. NEUROLOGIC: No headache, seizures, numbness, tingling or weakness. PSYCHIATRIC: No depression, no loss of interest in normal activity or change in sleep pattern.     Exam: chaperone present  BP 134/78 mmHg  Ht 5' 2.75" (1.594 m)  Wt 105 lb (47.628 kg)  BMI 18.75 kg/m2  Body mass index is 18.75 kg/(m^2).  General appearance : Well developed well nourished female. No acute distress HEENT: Eyes: no retinal hemorrhage or exudates,  Neck supple, trachea midline, no carotid bruits, no thyroidmegaly Lungs: Clear to auscultation, no rhonchi or wheezes, or rib retractions  Heart: Regular rate and rhythm, no murmurs or gallops Breast:Examined in sitting and supine position were symmetrical in appearance, no palpable masses or tenderness,  no skin retraction, no nipple inversion, no nipple discharge, no skin discoloration, no axillary or supraclavicular lymphadenopathy Abdomen: no palpable masses or tenderness, no rebound or guarding Extremities: no edema or skin discoloration or tenderness  Pelvic:  Bartholin, Urethra, Skene Glands: Within normal limits             Vagina: No gross lesions or discharge, atrophic changes  Cervix: No gross lesions or discharge  Uterus  anteverted, normal size, shape and consistency, non-tender and mobile  Adnexa  Without masses or tenderness  Anus and perineum   normal   Rectovaginal  normal sphincter tone without palpated masses or tenderness             Hemoccult PCP provides     Assessment/Plan:  80 y.o. female for annual exam with osteopenia has been on a drug holiday since she had been treated in the past for osteoporosis doing well. She was instructed to cut down the amount of calcium that she was taking I believe was too much. I asked her to take only one Caltrate plus tablet daily and vitamin D 2000 units daily. Pap smear no longer indicated. PCP we'll be doing her blood work.   Terrance Mass MD, 3:10 PM 08/25/2015

## 2015-08-26 DIAGNOSIS — J3089 Other allergic rhinitis: Secondary | ICD-10-CM | POA: Diagnosis not present

## 2015-08-26 DIAGNOSIS — J301 Allergic rhinitis due to pollen: Secondary | ICD-10-CM | POA: Diagnosis not present

## 2015-09-02 DIAGNOSIS — J301 Allergic rhinitis due to pollen: Secondary | ICD-10-CM | POA: Diagnosis not present

## 2015-09-02 DIAGNOSIS — J3089 Other allergic rhinitis: Secondary | ICD-10-CM | POA: Diagnosis not present

## 2015-09-06 ENCOUNTER — Other Ambulatory Visit: Payer: Self-pay | Admitting: Anesthesiology

## 2015-09-06 DIAGNOSIS — Z1211 Encounter for screening for malignant neoplasm of colon: Secondary | ICD-10-CM

## 2015-09-07 DIAGNOSIS — L309 Dermatitis, unspecified: Secondary | ICD-10-CM | POA: Diagnosis not present

## 2015-09-09 DIAGNOSIS — L309 Dermatitis, unspecified: Secondary | ICD-10-CM | POA: Diagnosis not present

## 2015-09-09 DIAGNOSIS — J301 Allergic rhinitis due to pollen: Secondary | ICD-10-CM | POA: Diagnosis not present

## 2015-09-09 DIAGNOSIS — J3089 Other allergic rhinitis: Secondary | ICD-10-CM | POA: Diagnosis not present

## 2015-09-16 DIAGNOSIS — J3089 Other allergic rhinitis: Secondary | ICD-10-CM | POA: Diagnosis not present

## 2015-09-16 DIAGNOSIS — J301 Allergic rhinitis due to pollen: Secondary | ICD-10-CM | POA: Diagnosis not present

## 2015-09-23 DIAGNOSIS — J301 Allergic rhinitis due to pollen: Secondary | ICD-10-CM | POA: Diagnosis not present

## 2015-09-23 DIAGNOSIS — J3089 Other allergic rhinitis: Secondary | ICD-10-CM | POA: Diagnosis not present

## 2015-09-30 DIAGNOSIS — J3089 Other allergic rhinitis: Secondary | ICD-10-CM | POA: Diagnosis not present

## 2015-09-30 DIAGNOSIS — J301 Allergic rhinitis due to pollen: Secondary | ICD-10-CM | POA: Diagnosis not present

## 2015-10-07 DIAGNOSIS — J3089 Other allergic rhinitis: Secondary | ICD-10-CM | POA: Diagnosis not present

## 2015-10-07 DIAGNOSIS — J301 Allergic rhinitis due to pollen: Secondary | ICD-10-CM | POA: Diagnosis not present

## 2015-10-12 ENCOUNTER — Other Ambulatory Visit: Payer: Self-pay | Admitting: Internal Medicine

## 2015-10-13 NOTE — Telephone Encounter (Signed)
Faxed to pharmacy

## 2015-10-14 DIAGNOSIS — J301 Allergic rhinitis due to pollen: Secondary | ICD-10-CM | POA: Diagnosis not present

## 2015-10-14 DIAGNOSIS — J3089 Other allergic rhinitis: Secondary | ICD-10-CM | POA: Diagnosis not present

## 2015-10-21 DIAGNOSIS — J3089 Other allergic rhinitis: Secondary | ICD-10-CM | POA: Diagnosis not present

## 2015-10-21 DIAGNOSIS — J301 Allergic rhinitis due to pollen: Secondary | ICD-10-CM | POA: Diagnosis not present

## 2015-10-28 DIAGNOSIS — J301 Allergic rhinitis due to pollen: Secondary | ICD-10-CM | POA: Diagnosis not present

## 2015-10-28 DIAGNOSIS — J3089 Other allergic rhinitis: Secondary | ICD-10-CM | POA: Diagnosis not present

## 2015-11-11 DIAGNOSIS — J3089 Other allergic rhinitis: Secondary | ICD-10-CM | POA: Diagnosis not present

## 2015-11-11 DIAGNOSIS — J301 Allergic rhinitis due to pollen: Secondary | ICD-10-CM | POA: Diagnosis not present

## 2015-11-18 DIAGNOSIS — J301 Allergic rhinitis due to pollen: Secondary | ICD-10-CM | POA: Diagnosis not present

## 2015-11-18 DIAGNOSIS — J3089 Other allergic rhinitis: Secondary | ICD-10-CM | POA: Diagnosis not present

## 2015-11-25 DIAGNOSIS — J301 Allergic rhinitis due to pollen: Secondary | ICD-10-CM | POA: Diagnosis not present

## 2015-11-25 DIAGNOSIS — J3089 Other allergic rhinitis: Secondary | ICD-10-CM | POA: Diagnosis not present

## 2015-11-29 DIAGNOSIS — L821 Other seborrheic keratosis: Secondary | ICD-10-CM | POA: Diagnosis not present

## 2015-11-29 DIAGNOSIS — L814 Other melanin hyperpigmentation: Secondary | ICD-10-CM | POA: Diagnosis not present

## 2015-11-29 DIAGNOSIS — L565 Disseminated superficial actinic porokeratosis (DSAP): Secondary | ICD-10-CM | POA: Diagnosis not present

## 2015-11-29 DIAGNOSIS — D225 Melanocytic nevi of trunk: Secondary | ICD-10-CM | POA: Diagnosis not present

## 2015-11-29 DIAGNOSIS — L570 Actinic keratosis: Secondary | ICD-10-CM | POA: Diagnosis not present

## 2015-11-29 DIAGNOSIS — D1801 Hemangioma of skin and subcutaneous tissue: Secondary | ICD-10-CM | POA: Diagnosis not present

## 2015-12-02 DIAGNOSIS — J3089 Other allergic rhinitis: Secondary | ICD-10-CM | POA: Diagnosis not present

## 2015-12-02 DIAGNOSIS — J301 Allergic rhinitis due to pollen: Secondary | ICD-10-CM | POA: Diagnosis not present

## 2015-12-09 DIAGNOSIS — J301 Allergic rhinitis due to pollen: Secondary | ICD-10-CM | POA: Diagnosis not present

## 2015-12-09 DIAGNOSIS — J3089 Other allergic rhinitis: Secondary | ICD-10-CM | POA: Diagnosis not present

## 2015-12-13 ENCOUNTER — Other Ambulatory Visit: Payer: Self-pay

## 2015-12-13 MED ORDER — NONFORMULARY OR COMPOUNDED ITEM: 11 refills | Status: DC

## 2015-12-13 NOTE — Telephone Encounter (Signed)
Called into pharmacy

## 2015-12-16 DIAGNOSIS — J301 Allergic rhinitis due to pollen: Secondary | ICD-10-CM | POA: Diagnosis not present

## 2015-12-16 DIAGNOSIS — J3089 Other allergic rhinitis: Secondary | ICD-10-CM | POA: Diagnosis not present

## 2015-12-19 NOTE — Progress Notes (Signed)
HPI The patient presents for followup of hypertension and palpitations.  At the last visit she was having dyspnea.  However, POET (Plain Old Exercise Treadmill) was negative for evidence of ischemia.  She returns for follow up.  She is now caring for her husband in who has home hospice.  She has quite a bit of stress.  She does have some mild SOB.  However, this is at baseline and not severe. The patient denies any new symptoms such as chest discomfort, neck or arm discomfort. There has been no new PND or orthopnea. There have been no reported palpitations, presyncope or syncope.  Allergies  Allergen Reactions  . Codeine     REACTION: NAUSEA  . Erythromycin     REACTION: NAUSEA  . Other     Environmental allergies    Current Outpatient Prescriptions  Medication Sig Dispense Refill  . ALPRAZolam (XANAX) 0.5 MG tablet TAKE 1/2 TO 1 TABLET THREE TIMES DAILY AS NEEDED. 70 tablet 2  . amLODipine (NORVASC) 2.5 MG tablet Take 1 tablet (2.5 mg total) by mouth daily. 90 tablet 3  . aspirin 81 MG tablet Take 81 mg by mouth daily.      Marland Kitchen BIOTIN 5000 PO Take by mouth.    . calcium carbonate (CALCIUM 600) 600 MG TABS Take 4 tablets by mouth daily     . cetirizine (ZYRTEC) 10 MG tablet Take 10 mg by mouth daily.      . clidinium-chlordiazePOXIDE (LIBRAX) 5-2.5 MG capsule Take 1 capsule by mouth 3 (three) times daily as needed. For abd cramping 30 capsule 0  . cyclobenzaprine (FLEXERIL) 10 MG tablet Take 1 tablet (10 mg total) by mouth 3 (three) times daily as needed. 30 tablet 1  . EPINEPHrine (EPI-PEN) 0.3 mg/0.3 mL DEVI Inject 0.3 mg into the muscle as needed.    . Estradiol POWD APPLY 1ML VAGINALLY TWICE WEEKLY. 24 g 3  . fish oil-omega-3 fatty acids 1000 MG capsule Take 1 g by mouth daily.      . Multiple Vitamins-Minerals (PROTEGRA PO) Take by mouth daily.      . multivitamin (THERAGRAN) per tablet Take 1 tablet by mouth daily.      . nitroGLYCERIN (NITROSTAT) 0.4 MG SL tablet Place 1 tablet  (0.4 mg total) under the tongue every 5 (five) minutes as needed. May repeat 3 times 25 tablet 2  . NONFORMULARY OR COMPOUNDED ITEM Estradiol .02% 1 ML Prefilled Applicator Sig: apply vaginally twice a week #90 Day Supply with 4 refills 1 each 11  . traZODone (DESYREL) 100 MG tablet TAKE ONE TABLET AT BEDTIME. 90 tablet 3  . UNABLE TO FIND Allergy shots weekly     Current Facility-Administered Medications  Medication Dose Route Frequency Provider Last Rate Last Dose  . nystatin cream (MYCOSTATIN)   Topical BID Huel Cote, NP        Past Medical History:  Diagnosis Date  . Anxiety   . Coronary artery spasm (HCC)    Non obstructive CAD  . Diverticulosis of colon   . Fibromyalgia   . Hx of colonic polyps   . Lumbar back pain   . MVP (mitral valve prolapse)    No antibiotics required for procedures  . Osteoporosis   . Palpitations   . Pneumonia   . Shingles     Past Surgical History:  Procedure Laterality Date  . CHOLECYSTECTOMY  03/2003  . LUMBAR LAMINECTOMY    . TONSILLECTOMY     as a  child    ROS:  As stated in the HPI and negative for all other systems.  PHYSICAL EXAM BP 126/74 (BP Location: Left Arm, Patient Position: Sitting, Cuff Size: Normal)   Pulse 86   Ht 5' 3.75" (1.619 m)   Wt 103 lb 9.6 oz (47 kg)   BMI 17.92 kg/m  GENERAL:  Well appearing NECK:  No jugular venous distention, waveform within normal limits, carotid upstroke brisk and symmetric, no bruits, no thyromegaly LUNGS:  Clear to auscultation bilaterally CHEST:  Unremarkable HEART:  PMI not displaced or sustained,S1 and S2 within normal limits, no S3, no S4, no clicks, no rubs, no murmurs ABD:  Flat, positive bowel sounds normal in frequency in pitch, no bruits, no rebound, no guarding, no midline pulsatile mass, no hepatomegaly, no splenomegaly EXT:  2 plus pulses throughout, no edema, no cyanosis no clubbing  EKG: Normal sinus rhythm, rate 86, left axis, LAFB, poor anterior R-wave  progression, no acute ST-T wave changes. No change from previous.  12/20/2015  ASSESSMENT AND PLAN  PALPITATIONS:  The patient has stable pattern of palpitations. She will continue on the meds as listed.  MVP:  I would not suspect any mitral regurgitation. No further imaging is indicated.  SOB:  This is at baseline and there have been no new symptoms since the POET (Plain Old Exercise Treadmill).  No further evaluation is planned.

## 2015-12-20 ENCOUNTER — Ambulatory Visit (INDEPENDENT_AMBULATORY_CARE_PROVIDER_SITE_OTHER): Payer: Medicare Other | Admitting: Cardiology

## 2015-12-20 ENCOUNTER — Encounter (INDEPENDENT_AMBULATORY_CARE_PROVIDER_SITE_OTHER): Payer: Self-pay

## 2015-12-20 ENCOUNTER — Encounter: Payer: Self-pay | Admitting: Cardiology

## 2015-12-20 VITALS — BP 126/74 | HR 86 | Ht 63.75 in | Wt 103.6 lb

## 2015-12-20 DIAGNOSIS — I10 Essential (primary) hypertension: Secondary | ICD-10-CM

## 2015-12-20 DIAGNOSIS — R002 Palpitations: Secondary | ICD-10-CM

## 2015-12-20 NOTE — Patient Instructions (Signed)
Medication Instructions:  Continue current medications  Labwork: NIONE  Testing/Procedures: NONE  Follow-Up: Your physician wants you to follow-up in: 1 Year. You will receive a reminder letter in the mail two months in advance. If you don't receive a letter, please call our office to schedule the follow-up appointment.   Any Other Special Instructions Will Be Listed Below (If Applicable).   If you need a refill on your cardiac medications before your next appointment, please call your pharmacy.

## 2015-12-23 DIAGNOSIS — J301 Allergic rhinitis due to pollen: Secondary | ICD-10-CM | POA: Diagnosis not present

## 2015-12-23 DIAGNOSIS — J3089 Other allergic rhinitis: Secondary | ICD-10-CM | POA: Diagnosis not present

## 2015-12-30 DIAGNOSIS — J301 Allergic rhinitis due to pollen: Secondary | ICD-10-CM | POA: Diagnosis not present

## 2015-12-30 DIAGNOSIS — J3089 Other allergic rhinitis: Secondary | ICD-10-CM | POA: Diagnosis not present

## 2016-01-04 DIAGNOSIS — H401331 Pigmentary glaucoma, bilateral, mild stage: Secondary | ICD-10-CM | POA: Diagnosis not present

## 2016-01-06 DIAGNOSIS — J3089 Other allergic rhinitis: Secondary | ICD-10-CM | POA: Diagnosis not present

## 2016-01-06 DIAGNOSIS — J301 Allergic rhinitis due to pollen: Secondary | ICD-10-CM | POA: Diagnosis not present

## 2016-01-07 ENCOUNTER — Telehealth: Payer: Self-pay | Admitting: Pulmonary Disease

## 2016-01-07 MED ORDER — FUROSEMIDE 20 MG PO TABS: 20.0000 mg | ORAL_TABLET | Freq: Every day | ORAL | 3 refills | Status: DC | PRN

## 2016-01-07 NOTE — Telephone Encounter (Signed)
Patient states that her legs are swollen to the point they are very uncomfortable.  Patient is not currently taking any water pills.  She said the left foot is worse than the right foot.   Dr. Lenna Gilford, please advise.  Current Outpatient Prescriptions on File Prior to Visit  Medication Sig Dispense Refill  . ALPRAZolam (XANAX) 0.5 MG tablet TAKE 1/2 TO 1 TABLET THREE TIMES DAILY AS NEEDED. 70 tablet 2  . amLODipine (NORVASC) 2.5 MG tablet Take 1 tablet (2.5 mg total) by mouth daily. 90 tablet 3  . aspirin 81 MG tablet Take 81 mg by mouth daily.      Marland Kitchen BIOTIN 5000 PO Take by mouth.    . calcium carbonate (CALCIUM 600) 600 MG TABS Take 4 tablets by mouth daily     . cetirizine (ZYRTEC) 10 MG tablet Take 10 mg by mouth daily.      . clidinium-chlordiazePOXIDE (LIBRAX) 5-2.5 MG capsule Take 1 capsule by mouth 3 (three) times daily as needed. For abd cramping 30 capsule 0  . cyclobenzaprine (FLEXERIL) 10 MG tablet Take 1 tablet (10 mg total) by mouth 3 (three) times daily as needed. 30 tablet 1  . EPINEPHrine (EPI-PEN) 0.3 mg/0.3 mL DEVI Inject 0.3 mg into the muscle as needed.    . Estradiol POWD APPLY 1ML VAGINALLY TWICE WEEKLY. 24 g 3  . fish oil-omega-3 fatty acids 1000 MG capsule Take 1 g by mouth daily.      . Multiple Vitamins-Minerals (PROTEGRA PO) Take by mouth daily.      . multivitamin (THERAGRAN) per tablet Take 1 tablet by mouth daily.      . nitroGLYCERIN (NITROSTAT) 0.4 MG SL tablet Place 1 tablet (0.4 mg total) under the tongue every 5 (five) minutes as needed. May repeat 3 times 25 tablet 2  . NONFORMULARY OR COMPOUNDED ITEM Estradiol .02% 1 ML Prefilled Applicator Sig: apply vaginally twice a week #90 Day Supply with 4 refills 1 each 11  . traZODone (DESYREL) 100 MG tablet TAKE ONE TABLET AT BEDTIME. 90 tablet 3  . UNABLE TO FIND Allergy shots weekly     Current Facility-Administered Medications on File Prior to Visit  Medication Dose Route Frequency Provider Last Rate Last Dose   . nystatin cream (MYCOSTATIN)   Topical BID Huel Cote, NP       Allergies  Allergen Reactions  . Codeine     REACTION: NAUSEA  . Erythromycin     REACTION: NAUSEA  . Other     Environmental allergies

## 2016-01-07 NOTE — Telephone Encounter (Signed)
Per SN: okay for Lasix 20mg  #30, 1 po QD prn for swelling and would recommend low salt diet.  Called spoke with patient and advised of SN's recommendations as stated above.  Pt voiced her understanding and requested the Rx be sent to [facility].  She is aware to contact the office if her symptoms do not improve or they worsen.  Rx sent Nothing further needed at this time; will sign off.

## 2016-01-07 NOTE — Telephone Encounter (Signed)
Pt c/o b/l feet swelling since yesterday.  Denies any breathing complaints, trauma to foot.  Denies new medication changes.  Has not taken anything to help with s/s.   Pt has not been seen since 06/2013-I asked pt if she has contacted PCP regarding this.  Pt states she has not, was told by SN that he is happy to help out d/t pt's spouse's current health status.    Pt uses [facility]

## 2016-01-10 DIAGNOSIS — J3089 Other allergic rhinitis: Secondary | ICD-10-CM | POA: Diagnosis not present

## 2016-01-10 DIAGNOSIS — J301 Allergic rhinitis due to pollen: Secondary | ICD-10-CM | POA: Diagnosis not present

## 2016-01-17 ENCOUNTER — Telehealth: Payer: Self-pay | Admitting: Internal Medicine

## 2016-01-17 NOTE — Telephone Encounter (Signed)
Fine with me

## 2016-01-17 NOTE — Telephone Encounter (Signed)
I am not accepting any transfer patients at this time.

## 2016-01-17 NOTE — Telephone Encounter (Signed)
Dr burns,  Patient is requesting to be taken under your care. Is this ok with you?   Dr Sharlet Salina,  Is this ok with you?

## 2016-01-20 DIAGNOSIS — J3089 Other allergic rhinitis: Secondary | ICD-10-CM | POA: Diagnosis not present

## 2016-01-20 DIAGNOSIS — J301 Allergic rhinitis due to pollen: Secondary | ICD-10-CM | POA: Diagnosis not present

## 2016-01-26 DIAGNOSIS — J3089 Other allergic rhinitis: Secondary | ICD-10-CM | POA: Diagnosis not present

## 2016-01-26 DIAGNOSIS — J301 Allergic rhinitis due to pollen: Secondary | ICD-10-CM | POA: Diagnosis not present

## 2016-02-03 DIAGNOSIS — J301 Allergic rhinitis due to pollen: Secondary | ICD-10-CM | POA: Diagnosis not present

## 2016-02-03 DIAGNOSIS — J3089 Other allergic rhinitis: Secondary | ICD-10-CM | POA: Diagnosis not present

## 2016-02-07 ENCOUNTER — Ambulatory Visit: Payer: Medicare Other | Admitting: Internal Medicine

## 2016-02-08 ENCOUNTER — Ambulatory Visit (INDEPENDENT_AMBULATORY_CARE_PROVIDER_SITE_OTHER): Payer: Medicare Other | Admitting: Internal Medicine

## 2016-02-08 ENCOUNTER — Ambulatory Visit: Payer: Medicare Other | Admitting: Internal Medicine

## 2016-02-08 ENCOUNTER — Encounter: Payer: Self-pay | Admitting: Internal Medicine

## 2016-02-08 VITALS — BP 158/90 | HR 50 | Temp 98.4°F | Resp 16 | Ht 63.5 in | Wt 103.0 lb

## 2016-02-08 DIAGNOSIS — F411 Generalized anxiety disorder: Secondary | ICD-10-CM

## 2016-02-08 DIAGNOSIS — I872 Venous insufficiency (chronic) (peripheral): Secondary | ICD-10-CM

## 2016-02-08 MED ORDER — ALPRAZOLAM 0.5 MG PO TABS: ORAL_TABLET | ORAL | 5 refills | Status: DC

## 2016-02-08 NOTE — Patient Instructions (Signed)
We have sent in the xanax renewal and have given you the prescription for the compression stocking.   Use the stockings during the day to help prevent the swelling in the legs. If they do not work please call us back and we may end up changing the medicines some.

## 2016-02-08 NOTE — Progress Notes (Signed)
   Subjective:    Patient ID: [name] [name], female    DOB: 1935/09/26, 80 y.o.   MRN: [id number]  HPI The patient is a 80 YO female coming in for some problems. She is having a hard time dealing with the loss of her husband (in the last several weeks). She is feeling kind of numb and having some crying as well. She has used xanax and it is helping some. She is using slightly more lately and feels like that would calm her down some. Denies SI/HI. She is having some problems sleeping.   Review of Systems  Constitutional: Positive for activity change and appetite change. Negative for chills, fatigue, fever and unexpected weight change.  Respiratory: Negative.   Cardiovascular: Negative.   Gastrointestinal: Negative.   Musculoskeletal: Negative.   Skin: Negative.   Neurological: Negative.   Psychiatric/Behavioral: Positive for decreased concentration, dysphoric mood and sleep disturbance. Negative for agitation, behavioral problems, confusion, self-injury and suicidal ideas. The patient is nervous/anxious.       Objective:   Physical Exam  Constitutional: She is oriented to person, place, and time. She appears well-developed and well-nourished.  HENT:  Head: Normocephalic and atraumatic.  Eyes: EOM are normal.  Neck: Normal range of motion.  Cardiovascular: Normal rate and regular rhythm.   Pulmonary/Chest: Effort normal. No respiratory distress. She has no wheezes. She has no rales.  Abdominal: Soft. She exhibits no distension. There is no tenderness.  Musculoskeletal: She exhibits no edema.  Neurological: She is alert and oriented to person, place, and time.  Skin: Skin is warm and dry.  Psychiatric:  Some crying during the visit.    Vitals:   02/08/16 1103  BP: (!) 158/90  Pulse: (!) 50  Resp: 16  Temp: 98.4 F (36.9 C)  SpO2: 97%  Weight: 103 lb (46.7 kg)  Height: 5' 3.5" (1.613 m)      Assessment & Plan:

## 2016-02-08 NOTE — Progress Notes (Signed)
Pre visit review using our clinic review tool, if applicable. No additional management support is needed unless otherwise documented below in the visit note. 

## 2016-02-10 DIAGNOSIS — J301 Allergic rhinitis due to pollen: Secondary | ICD-10-CM | POA: Diagnosis not present

## 2016-02-10 DIAGNOSIS — J3089 Other allergic rhinitis: Secondary | ICD-10-CM | POA: Diagnosis not present

## 2016-02-10 NOTE — Assessment & Plan Note (Signed)
Some increase in anxiety due to loss of spouse recently. Refill the xanax today and offered counseling or support but she declines. She has support from her family and church.

## 2016-02-17 DIAGNOSIS — J3089 Other allergic rhinitis: Secondary | ICD-10-CM | POA: Diagnosis not present

## 2016-02-17 DIAGNOSIS — J301 Allergic rhinitis due to pollen: Secondary | ICD-10-CM | POA: Diagnosis not present

## 2016-02-24 DIAGNOSIS — J301 Allergic rhinitis due to pollen: Secondary | ICD-10-CM | POA: Diagnosis not present

## 2016-02-24 DIAGNOSIS — J3089 Other allergic rhinitis: Secondary | ICD-10-CM | POA: Diagnosis not present

## 2016-03-02 DIAGNOSIS — J3089 Other allergic rhinitis: Secondary | ICD-10-CM | POA: Diagnosis not present

## 2016-03-02 DIAGNOSIS — J301 Allergic rhinitis due to pollen: Secondary | ICD-10-CM | POA: Diagnosis not present

## 2016-03-09 DIAGNOSIS — J301 Allergic rhinitis due to pollen: Secondary | ICD-10-CM | POA: Diagnosis not present

## 2016-03-09 DIAGNOSIS — J3089 Other allergic rhinitis: Secondary | ICD-10-CM | POA: Diagnosis not present

## 2016-03-09 DIAGNOSIS — J019 Acute sinusitis, unspecified: Secondary | ICD-10-CM | POA: Diagnosis not present

## 2016-03-09 DIAGNOSIS — R05 Cough: Secondary | ICD-10-CM | POA: Diagnosis not present

## 2016-03-16 ENCOUNTER — Other Ambulatory Visit: Payer: Self-pay | Admitting: Allergy and Immunology

## 2016-03-16 ENCOUNTER — Ambulatory Visit
Admission: RE | Admit: 2016-03-16 | Discharge: 2016-03-16 | Disposition: A | Discharge status: Home / Self Care | Payer: Medicare Other | Source: Ambulatory Visit | Attending: Allergy and Immunology | Admitting: Allergy and Immunology

## 2016-03-16 DIAGNOSIS — R05 Cough: Secondary | ICD-10-CM | POA: Diagnosis not present

## 2016-03-16 DIAGNOSIS — R059 Cough, unspecified: Secondary | ICD-10-CM

## 2016-03-16 DIAGNOSIS — J3089 Other allergic rhinitis: Secondary | ICD-10-CM | POA: Diagnosis not present

## 2016-03-16 DIAGNOSIS — J301 Allergic rhinitis due to pollen: Secondary | ICD-10-CM | POA: Diagnosis not present

## 2016-03-16 DIAGNOSIS — J019 Acute sinusitis, unspecified: Secondary | ICD-10-CM | POA: Diagnosis not present

## 2016-03-27 ENCOUNTER — Ambulatory Visit (INDEPENDENT_AMBULATORY_CARE_PROVIDER_SITE_OTHER): Payer: Medicare Other | Admitting: Pulmonary Disease

## 2016-03-27 ENCOUNTER — Encounter: Payer: Self-pay | Admitting: Pulmonary Disease

## 2016-03-27 VITALS — BP 124/80 | HR 77 | Temp 97.0°F | Wt 102.5 lb

## 2016-03-27 DIAGNOSIS — R938 Abnormal findings on diagnostic imaging of other specified body structures: Secondary | ICD-10-CM

## 2016-03-27 DIAGNOSIS — I2510 Atherosclerotic heart disease of native coronary artery without angina pectoris: Secondary | ICD-10-CM

## 2016-03-27 DIAGNOSIS — J301 Allergic rhinitis due to pollen: Secondary | ICD-10-CM

## 2016-03-27 DIAGNOSIS — I10 Essential (primary) hypertension: Secondary | ICD-10-CM | POA: Diagnosis not present

## 2016-03-27 DIAGNOSIS — R9389 Abnormal findings on diagnostic imaging of other specified body structures: Secondary | ICD-10-CM

## 2016-03-27 NOTE — Progress Notes (Signed)
Subjective:    Patient ID: [name] [name], female    DOB: 1935-10-03, 80 y.o.   MRN: [id number]  HPI 80 y/o WF here for a 6 month follow up visit... she has multiple medical problems including HBP;  CAD/ hx palpit;  Divertics/ IBS/ Polyps/ Angiodysplasia;  LBP & FM;  Osteoporosis;  Anxiety...  ~  January 23, 2012:  77mo ROV & [name] [name] was Dx w/ TMJ several weeks ago & has new mouth guard- we discussed assoc w/ anxiety and she has Xanax & Desyrel...     She saw DrVanWinkle for an allergy f/u 6/13 & she was doing satis on allergy shots, Zyrtek & saline, no changes made...    She saw DrHochrein 6/13 for Cards f/u doing satis & no changes made to her med regimen...    We reviewed prob list, meds, xrays and labs> see below >> she already had her 2013 Flu vaccine...  ~  July 23, 2012:  86mo ROV & [name] [name] relates a good bit of stress since she is the caregiver of her family... We reviewed the following medical problems during today's office visit >>     AR> on Zyrtek & allergy shots per DrVanWinkle- seen 6/13 & stable...    HBP> on Amlodipine2.5mg  & BP= 128/68; denies HA, CP, palpit, SOB, edema, etc...    CAD & Palpit> on ASA81 & FishOil; notes some incr palpit w/ stress but [name] helps; knows to avoid caffeine etc; she saw DrHochrein 6/12> EKG showed NSR, rate84, septal infarct (poor R prog V1-2, otherw neg, NAD; doing satis w/o new symptoms, no changes made...    Divertics & Colon Polyps> on Librax; followed by DrDBrodie w/ hx IBS and f/u colon due in 2013=> we will refer...    Hx angiodysplasia> as noted, she denies any recent bleeding and Hg has been stable...    Gyn- DrFernandez> on Estradiol [id number] one applic 2x/wk for dryness etc...    Hx LBP/ FM> on Flexeril prn and she copes very well...    Osteopenia> on Calcium, MVI, & still on Fosamax drug holiday (since 3/11 per DrGottsegen) & repeat BMD 3/12 showed TScore -1.8 in Spine (see below); we decided to leave her off the Fosamax until f/u BMD  3/14=> pending...    Anxiety> on Xanax Prn & Desyrel 100mg /d; she is under a lot of stress still w/ husb illness & daugh divorce... We reviewed prob list, meds, xrays and labs> see below for updates >>   LABS 3/14:  FLP- at goals on diet x TG=170;  Chems- wnl;  CBC- wnl;  TSH=1.74;  UA- essent neg...  BMD 3/14 showed TScore -2.0 in Lumbar spine (was -1.8 in 2012); Rec to pt to consider re-start of Bisphos therapy but she declines & prefers to stay on Drug Holiday + supplements/ exercise...  ~  January 27, 2013:  72mo ROV & [name] [name] is stable, she has mult minor somatic complaints (not sleeping well, superfic varicosities, etc) but overall stable...     BP controlled on Amlod2.5 & BP= 114/68 w/o CP, palpit, SOB, edema, etc; followed for Cards by DrHochrein on ASA81 & prn NTG (hasn't needed)...     GI followed by DrDBrodie w/ divertics, colon polyps and hx angiodysplasia; no bleeding episodes, uses Librax prn...    Hx LBP & FM- on Flex10 prn & doing satis w/ minor somatic complaints as noted...    Anxiety treated w/ Alpraz0.5 as needed & the Desyrel100 Qhs helps her rest...  We reviewed prob  list, meds, xrays and labs> see below for updates >> she had the 2014 Flu vaccine in Sept... Refills today as requested...   ~  July 28, 2013:  31mo ROV & [name] [name] notes that her allergies are acting up & she has some arthritis in left thumb- rec to take Zyrtek, Saline, and try hot soaks & Aleve prn; she is under a lot of stress at home w/ husb illness, not resting well, etc... We reviewed the following medical problems during today's office visit >>     AR> on Zyrtek & allergy shots per DrVanWinkle- seen 6/13 & stable...    HBP> on Amlodipine2.5mg  & BP= 120/82; denies HA, CP, palpit, SOB, edema, etc...    CAD & Palpit> on ASA81 & FishOil; notes some palpit w/ stress but [name] helps; knows to avoid caffeine etc; she saw DrHochrein 6/12> EKG showed NSR, rate84, septal infarct (poor R prog V1-2, otherw neg, NAD);  doing satis w/o new symptoms...     Divertics & Colon Polyps> on Librax; followed by DrDBrodie w/ hx IBS and last colon 2009 showed AVM in cecum (not bleeding) & 65mm hyperplastic polyp in sigmoid...    Hx angiodysplasia> as noted, she denies any recent bleeding and Hg has been stable...    Gyn- DrFernandez> on Estradiol 1.70% one applic 2x/wk for dryness etc; she was seen 3/15 & note reviewed; mammogram 1/15 was neg...    Hx LBP/ FM> on Flexeril prn and she copes very well...    Osteopenia> on Calcium, MVI, & still on Fosamax drug holiday (since 3/11 per DrGottsegen/Fernandez) & BMD 3/12 showed TScore -1.8 in Spine (see below); we decided to leave her off the Fosamax until f/u BMD 6/14 w/ Tscore -2.0 in Spine; Rec to restart Bisphos therapy but she decided to continue her drug holiday + calcium, MVI, Vit D, and wt bearing exercise.    Anxiety> on Xanax Prn & Desyrel 100mg /d; she is under a lot of stress still w/ husb illness & daugh divorce... We reviewed prob list, meds, xrays and labs> see below for updates >> refills written per request...  CXR 3/15 showed norm heart size, clear lungs, NAD...  LABS 3/15:  FLP- at goals on diet w/ great HDL;  Chems- wnl;  CBC- wnl;  TSH=1.27  ~  March 27, 2016:  2.5 year ROV & pulmonary follow up visit>  [name] [name] has established PCP- DrCrawford, Cards- DrHochrein, [place], Allergy- DrVanWinkle... [name] tells me she went to her daughter's house in [place] over Thanksgiving and c/o a bad allergy attack, sinus infection, HA/ dizzy, cough w/ clear sput & some chest discomfort; she was seen by PCP & allergist=> given Depo, an antibiotic (Cefdinir), and cough syrup; CXR clear... Later changed to Augmentin + Astelin/Flonase, & Pred=> finally improved;  They were concerned over the CXR report indicating emphysema & aortic atherosclerosis and called for this follow up appt...     AR> on Zyrtek & allergy shots per DrVanWinkle- seen 2/17 & his note is reviewed...     HBP> on Amlodipine2.5mg  & Lasix20 prn; BP= 124/80; denies HA, CP, palpit, SOB, edema, etc...    CAD & Palpit> on ASA81 & FishOil; notes some palpit w/ stress but [name] helps; knows to avoid caffeine etc; she saw DrHochrein 8/17> EKG showed NSR, rate86, septal infarct (poor R prog V1-2, otherw neg, NAD); doing satis w/o new symptoms...     Divertics & Colon Polyps> on Librax prn; followed by DrDBrodie w/ hx IBS and last colon 2009  showed AVM in cecum (not bleeding) & 39mm hyperplastic polyp in sigmoid...    Hx angiodysplasia> as noted, she denies any recent bleeding and Hg has been stable...    Gyn- DrFernandez> on Estradiol [id number] one applic 2x/wk for dryness etc; she was seen 4/17 & note reviewed- he follows mammograms and pelvic exams...    Hx LBP/ FM> on Flexeril & Tylenol prn and she copes very well...    Osteopenia> on Calcium, MVI, & still on Fosamax drug holiday (since 3/11 per DrGottsegen/Fernandez) & BMD 4/16 showed TScore -1.8 in Spine (see below); which is stable off Fosamax; Rec to continue her drug holiday + calcium, MVI, Vit D, and wt bearing exercise.    Anxiety> on Xanax Prn & Desyrel 100mg /d; she is under a lot of stress still w/ husb illness & daugh divorce... EXAM shows Afeb, VSS, O2sat=95% on RA;  HEENT- meg, mallampati1;  Chest- clear w/o w/r/r;  Heart- RR w/o m/r/g;  Abd- thin, soft, nontender;  Ext- neg w/o c/c/e;  Neuro- intact...  FullPFTs 03/28/16>  FVC=2.28 (85%), FEV1=1.61 (81%), %1sec=71, mid-flows reduced at 65% predicted; after bronchodil FEV1=1.73 (7% incr);  TLC=4.31 (85%), RV=1.82 (76%), RV/TLC=42%;  DLCO=60% predicted & DL/VA=82%... This indicated mild airflow obstruction (small airways) and a mod decrease in DLCO...  IMP/PLAN>>  [name] [name] is reassured about her breathing & I do not believe that she would benefit from inhalers at this time- we will continue to monitor her breathing... She is prone to AB exac as evidenced at how long it takes to get over a URI;  She is up  to date on all indicated vaccinations;  It is poss that GERD & LPR are playing a roll here & she is advised to take an OTC H2 blocker Bid & antireflux regimen w. elev HOB 6" and NPO after dinner in the eve...           Problem List:  Hx of SINUSITIS (ICD-473.9) - uses ZYRTEK OTC, Saline nasal wash, & FLONASE as needed... she notes that her allergies are bothering her and she saw DrESL for allergy re-eval 7/12> still on shots... ~  6/13:  Now followed by DrVan Winkle> allergic rhinitis on shots since 1983, plus Zyrtek Prn & Saline Prn...  Hx of PNEUMONIA (ICD-486) >> resolved ~  CXR 3/12 showed clear lungs, NAD...  HYPERTENSION (ICD-401.9) - controlled on NORVASC 2.5mg /d... ~  9/12:  BP=136/82 & denies HA, fatigue, visual changes, CP, palipit, syncope, dyspnea, edema, etc... ~  3/13:  BP= 130/72 & she remains largely asymptomatic... ~  9/13:  BP= 124/62 & she denies visual symptoms, HA, CP, palpit, dizziness, SOB, edema, etc... ~  3/14:  on Amlodipine2.5mg  & BP= 128/68; denies HA, CP, palpit, SOB, edema, etc... ~  9/14: BP controlled on Amlod2.5 & BP= 114/68 w/o CP, palpit, SOB, edema, etc. ~  3/15: on Amlodipine2.5mg  & BP= 120/82; denies HA, CP, palpit, SOB, edema, etc...  CORONARY ARTERY DISEASE (ICD-414.00) & Hx of PALPITATIONS (ICD-785.1) - on ASA 81mg /d, and FISH OIL daily... hx coronary spasm w/ non-obstructive CAD on cath 1999 (20-30% diagonal branch LAD only)... sl anterobasal HK & EF=59%... she reports some incr palpit w/ stress- Alpraz 0.5mg  helps... ~  Baseline EKG showed NSR, septal infarct w/ poor R prog V1-2, otherw wnl/ NAD==> no change on 6/13 tracing. ~  She is followed by DrHochrein & seen yearly> OV 6/13 reviewed==> stable, no new symptoms, no changes made... ~  EKG 6/13 showed NSR, rate75, LAD, qs in V1-2,  otherw neg... ~  She saw DrHochrein 6/14> HBP & palpit; noted mod anxiety, no CP or new palpit, syncope, etc; ?MVP, no murmur heard- stable & no changes made. ~  EKG  6/14 showed NSR, rate73, LAD, old septal infarct, NAD...   DIVERTICULOSIS OF COLON (ICD-562.10), IBS (ICD-564.1), & COLONIC POLYPS (ICD-211.3) >> ~  colonoscopy 12/03 by DrDBrodie showing divertics only...  ~  eval 3/09 for hematochezia w/ Hg 13.5, Fe 103, repeat colon w/ 48mm polyp= hyperplastic + angiodysplasia... f/u planned 35yrs... ~  2/10: notes some increased abd cramping/ IBS- we will refill her LIBRAX for Prn use...  Hx of COLONIC ANGIODYSPLASIA (ICD-569.84) - as above; no recent bleeding noted & Hg stable... ~  Colonoscopy 3/09 w/ AVM in cecum, not bleeding...   BACK PAIN, LUMBAR (ICD-724.2) - S/P LLam L4-5 yrs ago... ortho eval 9/06 by DrApplington w/ MRI showing DDD... conservative rx...  FIBROMYALGIA (ICD-729.1) - resting OK w/ TRAZADONE 100mg Qhs & FLEXERIL 10mg  Tid Prn...  OSTEOPOROSIS (ICD-733.00) - prev on FOSAMAX w/ D (she insisted on this Rx), +Calcium, +MVI...  ~  BMD 1/08 was improved w/ TScores -1.5 to -2.1 on FOSAMAX therapy...  ~  7/09:  she wants to discuss stopping Fosamax in favor of Vit K which she read in "[employer]: Personal" newsletter- mult questions answered... we will check Vit D level (normal= 63) and I rec that she continue the Fosamax, calcium supplements, multivit therapy... ~  2/10:  f/u BMD showed TScores -1.3 in Spine, & -1.8 in left FemNeck = improved... ~  3/11:  DrGottsegen placed her on a Bisphos drug holiday... ~  3/12:  f/u BMD showed TScores -1.8 in Spine, & -1.5 in the left [facility] ~  3/13:  We reviewed prev results & decided to continue Calcium, MVI, Vit D, & hold Bisphos therapy until f/u BMD due 3/14... ~  3/14:  F/u BMD shows TScore -2.0 in Spine and -1.6 in left FemNeck; Rec to restart Bisphos therapy but she decided to continue her drug holiday + calcium, MVI, Vit D, and wt bearing exercise...  ANXIETY (ICD-300.00) - uses ALPRAZOLAM 0.5mg  Tid Prn...  HEALTH MAINTENANCE:  she notes brother had shingles and we discussed the shingles  vaccine- I rec that she get this shot at the health dept (NOTE: she reported mild case of Shingles on her left arm treated by [facility] in 2011)... she had PNEUMOVAX in 2007... gets yearly FLU SHOTs... GYN = DrGottsegen/ Toney Rakes and he has her on Premarin Rx + vag cream; Neg Mammogram 12/13 at [facility]...   Past Surgical History:  Procedure Laterality Date  . CHOLECYSTECTOMY  03/2003  . LUMBAR LAMINECTOMY    . TONSILLECTOMY     as a child    Outpatient Encounter Prescriptions as of 03/27/2016  Medication Sig Dispense Refill  . ALPRAZolam (XANAX) 0.5 MG tablet TAKE 1/2 TO 1 TABLET THREE TIMES DAILY AS NEEDED. 70 tablet 5  . amLODipine (NORVASC) 2.5 MG tablet Take 1 tablet (2.5 mg total) by mouth daily. 90 tablet 3  . aspirin 81 MG tablet Take 81 mg by mouth daily.      Marland Kitchen BIOTIN 5000 PO Take by mouth.    . calcium carbonate (CALCIUM 600) 600 MG TABS Take 600 mg by mouth daily.     . cetirizine (ZYRTEC) 10 MG tablet Take 10 mg by mouth daily.      . Cholecalciferol (VITAMIN D) 2000 units tablet Take 2,000 Units by mouth daily.    . clidinium-chlordiazePOXIDE (LIBRAX)  5-2.5 MG capsule Take 1 capsule by mouth 3 (three) times daily as needed. For abd cramping 30 capsule 0  . cyclobenzaprine (FLEXERIL) 10 MG tablet Take 1 tablet (10 mg total) by mouth 3 (three) times daily as needed. 30 tablet 1  . EPINEPHrine (EPI-PEN) 0.3 mg/0.3 mL DEVI Inject 0.3 mg into the muscle as needed.    . Estradiol POWD APPLY 1ML VAGINALLY TWICE WEEKLY. 24 g 3  . fish oil-omega-3 fatty acids 1000 MG capsule Take 1 g by mouth daily.      . furosemide (LASIX) 20 MG tablet Take 1 tablet (20 mg total) by mouth daily as needed. 30 tablet 3  . Multiple Vitamins-Minerals (PROTEGRA PO) Take by mouth daily.      . multivitamin (THERAGRAN) per tablet Take 1 tablet by mouth daily.      . nitroGLYCERIN (NITROSTAT) 0.4 MG SL tablet Place 1 tablet (0.4 mg total) under the tongue every 5 (five) minutes as needed. May repeat 3 times  25 tablet 2  . NONFORMULARY OR COMPOUNDED ITEM Estradiol .02% 1 ML Prefilled Applicator Sig: apply vaginally twice a week #90 Day Supply with 4 refills 1 each 11  . traZODone (DESYREL) 100 MG tablet TAKE ONE TABLET AT BEDTIME. 90 tablet 3  . UNABLE TO FIND Allergy shots weekly     No facility-administered encounter medications on file as of 03/27/2016.     Allergies  Allergen Reactions  . Codeine     REACTION: NAUSEA  . Erythromycin     REACTION: NAUSEA  . Other     Environmental allergies    Current Medications, Allergies, Past Medical History, Past Surgical History, Family History, and Social History were reviewed in [employer] record.    Review of Systems  Constitutional:  Denies F/C/S, anorexia, unexpected weight change. HEENT:  No HA, visual changes, earache, nasal symptoms, sore throat, hoarseness. Resp:  No cough, sputum, hemoptysis; no SOB, tightness, wheezing. Cardio:  No CP, DOE, orthopnea, edema. Notes occas palpit... GI:  Denies N/V/D/C or blood in stool; no reflux, abd pain, distention, or gas. GU:  No dysuria, freq, urgency, hematuria, or flank pain. MS:  Denies joint pain, swelling, tenderness, or decr ROM; no neck pain, back pain, etc. Neuro:  No tremors, seizures, dizziness, syncope, weakness, numbness, gait abn. Skin:  No suspicious lesions; she notes skin rash eval DrHouston. Heme:  No adenopathy, bruising, bleeding. Psyche: Denies confusion, sleep disturbance, hallucinations, anxiety, depression.     Objective:   Physical Exam   WD, WN, 80 y/o WF in NAD... Vital Signs:  Reviewed...   General:  Alert & oriented; pleasant & cooperative... HEENT:  [place]/AT, EOM-wnl, PERRLA, Fundi-benign, EACs-clear, TMs-wnl, NOSE-clear, THROAT-clear & wnl.  Neck:  Supple w/ fair ROM; no JVD; normal carotid impulses w/o bruits; no thyromegaly or nodules palpated; no lymphadenopathy.  Chest:  Clear to P & A; without wheezes/ rales/ or rhonchi  heard... Heart:  Regular Rhythm; norm S1 & S2 without murmurs/ rubs/ or gallops detected... Abdomen:  Soft & nontender; normal bowel sounds; no organomegaly or masses palpated... Ext:  Normal ROM; without deformities or arthritic changes; no varicose veins, venous insuffic, or edema;  Pulses intact w/o bruits... Neuro:  CNs II-XII intact; motor testing normal; sensory testing normal; gait normal & balance OK... Derm:  No lesions noted; no rash etc... Lymph:  No cervical, supraclavicular, axillary, or inguinal adenopathy palpated...  RADIOLOGY DATA:  Reviewed in the EPIC EMR & discussed w/ the patient...  LABORATORY  DATA:  Reviewed in the EPIC EMR & discussed w/ the patient...   Assessment & Plan:    Asthmatic Bronchitis>  She's had a hard time getting over this URI, persistent airway inflamm, congestion, cough, etc => finally better after 2 rounds of antibiotics, Pred, cough syrup, etc... ~  She also has some reflux  LPR>  rx w/ H2 blocker OTC Bid + antireflux regimen w/ NPO after dinner, & elev HOB etc...   HBP> on Amlodipine2.5mg  & BP well controlled & stable; continue same...     CAD & Palpit> on ASA & FishOil; followed by DrHochrein> doing satis w/o new symptoms, no changes made...     Divertics & Colon Polyps> on Librax; followed by DrDBrodie w/ hx IBS and stable...     Hx Angiodysplasia> AVM in cecum 3/09 colon; she denies any recent bleeding and Hg has been stable...     Hx LBP/ FM> on Flexeril prn and she copes very well...     Osteopenia> on Calcium, MVI, & Fosamax drug holiday since 3/11 per DrGottsegen & repeat BMD 3/12 showed TScore -1.8 in Spine and 3/14 showed TScore -2.0; she declines restart Bisphos & prefers to continue drug holiday...     Anxiety> on Xanax Prn & Desyrel 100mg /d; she is under a lot of stress still...   Patient's Medications  New Prescriptions   No medications on file  Previous Medications   ALPRAZOLAM (XANAX) 0.5 MG TABLET    TAKE 1/2 TO 1  TABLET THREE TIMES DAILY AS NEEDED.   AMLODIPINE (NORVASC) 2.5 MG TABLET    Take 1 tablet (2.5 mg total) by mouth daily.   ASPIRIN 81 MG TABLET    Take 81 mg by mouth daily.     BIOTIN 5000 PO    Take by mouth.   CALCIUM CARBONATE (CALCIUM 600) 600 MG TABS    Take 600 mg by mouth daily.    CETIRIZINE (ZYRTEC) 10 MG TABLET    Take 10 mg by mouth daily.     CHOLECALCIFEROL (VITAMIN D) 2000 UNITS TABLET    Take 2,000 Units by mouth daily.   CLIDINIUM-CHLORDIAZEPOXIDE (LIBRAX) 5-2.5 MG CAPSULE    Take 1 capsule by mouth 3 (three) times daily as needed. For abd cramping   CYCLOBENZAPRINE (FLEXERIL) 10 MG TABLET    Take 1 tablet (10 mg total) by mouth 3 (three) times daily as needed.   EPINEPHRINE (EPI-PEN) 0.3 MG/0.3 ML DEVI    Inject 0.3 mg into the muscle as needed.   ESTRADIOL POWD    APPLY 1ML VAGINALLY TWICE WEEKLY.   FISH OIL-OMEGA-3 FATTY ACIDS 1000 MG CAPSULE    Take 1 g by mouth daily.     FUROSEMIDE (LASIX) 20 MG TABLET    Take 1 tablet (20 mg total) by mouth daily as needed.   MULTIPLE VITAMINS-MINERALS (PROTEGRA PO)    Take by mouth daily.     MULTIVITAMIN (THERAGRAN) PER TABLET    Take 1 tablet by mouth daily.     NITROGLYCERIN (NITROSTAT) 0.4 MG SL TABLET    Place 1 tablet (0.4 mg total) under the tongue every 5 (five) minutes as needed. May repeat 3 times   NONFORMULARY OR COMPOUNDED ITEM    Estradiol .02% 1 ML Prefilled Applicator Sig: apply vaginally twice a week #90 Day Supply with 4 refills   TRAZODONE (DESYREL) 100 MG TABLET    TAKE ONE TABLET AT BEDTIME.   UNABLE TO FIND    Allergy shots weekly  Modified  Medications   No medications on file  Discontinued Medications   No medications on file

## 2016-03-27 NOTE — Patient Instructions (Signed)
Today we updated your med list in our EPIC system...    Continue your current medications the same...  We reviewed your recent CXR and your previous CT Chest from 2011...  We decided to check Full PFTs to evaluate your lung volumes and diffusion test (the best test to assess for emphysema)...    We will contact you w/ the results when available...   Call for any questions or if we can be of service in any way.Marland KitchenMarland Kitchen

## 2016-03-28 ENCOUNTER — Other Ambulatory Visit: Payer: Self-pay | Admitting: Pulmonary Disease

## 2016-03-28 ENCOUNTER — Encounter (INDEPENDENT_AMBULATORY_CARE_PROVIDER_SITE_OTHER): Payer: Medicare Other | Admitting: Pulmonary Disease

## 2016-03-28 DIAGNOSIS — R0600 Dyspnea, unspecified: Secondary | ICD-10-CM

## 2016-03-28 LAB — PULMONARY FUNCTION TEST
DL/VA % PRED: 82 %
DL/VA: 3.97 ml/min/mmHg/L
DLCO cor % pred: 57 %
DLCO cor: 14.03 ml/min/mmHg
DLCO unc % pred: 60 %
DLCO unc: 14.78 ml/min/mmHg
FEF 25-75 PRE: 0.96 L/s
FEF 25-75 Post: 1.38 L/sec
FEF2575-%CHANGE-POST: 43 %
FEF2575-%Pred-Post: 93 %
FEF2575-%Pred-Pre: 65 %
FEV1-%Change-Post: 7 %
FEV1-%PRED-PRE: 81 %
FEV1-%Pred-Post: 87 %
FEV1-PRE: 1.61 L
FEV1-Post: 1.73 L
FEV1FVC-%CHANGE-POST: 7 %
FEV1FVC-%Pred-Pre: 95 %
FEV6-%CHANGE-POST: 4 %
FEV6-%PRED-PRE: 86 %
FEV6-%Pred-Post: 90 %
FEV6-PRE: 2.18 L
FEV6-Post: 2.28 L
FEV6FVC-%Change-Post: 4 %
FEV6FVC-%PRED-PRE: 100 %
FEV6FVC-%Pred-Post: 105 %
FVC-%CHANGE-POST: 0 %
FVC-%PRED-POST: 85 %
FVC-%PRED-PRE: 85 %
FVC-POST: 2.28 L
FVC-Pre: 2.28 L
POST FEV6/FVC RATIO: 100 %
Post FEV1/FVC ratio: 76 %
Pre FEV1/FVC ratio: 71 %
Pre FEV6/FVC Ratio: 96 %
RV % PRED: 76 %
RV: 1.82 L
TLC % pred: 85 %
TLC: 4.31 L

## 2016-03-30 DIAGNOSIS — J301 Allergic rhinitis due to pollen: Secondary | ICD-10-CM | POA: Diagnosis not present

## 2016-03-30 DIAGNOSIS — J3089 Other allergic rhinitis: Secondary | ICD-10-CM | POA: Diagnosis not present

## 2016-04-03 ENCOUNTER — Telehealth: Payer: Self-pay | Admitting: Pulmonary Disease

## 2016-04-03 NOTE — Telephone Encounter (Signed)
Spoke with pt and informed of PFT results per Dr Lenna Gilford

## 2016-04-03 NOTE — Telephone Encounter (Signed)
Pt calling back again stating that leigh had called her about some results.Hillery Hunter

## 2016-04-06 DIAGNOSIS — J301 Allergic rhinitis due to pollen: Secondary | ICD-10-CM | POA: Diagnosis not present

## 2016-04-06 DIAGNOSIS — J3089 Other allergic rhinitis: Secondary | ICD-10-CM | POA: Diagnosis not present

## 2016-04-13 DIAGNOSIS — J301 Allergic rhinitis due to pollen: Secondary | ICD-10-CM | POA: Diagnosis not present

## 2016-04-13 DIAGNOSIS — J3089 Other allergic rhinitis: Secondary | ICD-10-CM | POA: Diagnosis not present

## 2016-04-17 ENCOUNTER — Other Ambulatory Visit: Payer: Self-pay | Admitting: Allergy and Immunology

## 2016-04-17 ENCOUNTER — Other Ambulatory Visit: Payer: Self-pay | Admitting: Internal Medicine

## 2016-04-17 DIAGNOSIS — R05 Cough: Secondary | ICD-10-CM

## 2016-04-17 DIAGNOSIS — R059 Cough, unspecified: Secondary | ICD-10-CM

## 2016-04-18 NOTE — Telephone Encounter (Signed)
Sent to pharmacy 

## 2016-04-20 DIAGNOSIS — J3089 Other allergic rhinitis: Secondary | ICD-10-CM | POA: Diagnosis not present

## 2016-04-20 DIAGNOSIS — J301 Allergic rhinitis due to pollen: Secondary | ICD-10-CM | POA: Diagnosis not present

## 2016-04-27 DIAGNOSIS — J301 Allergic rhinitis due to pollen: Secondary | ICD-10-CM | POA: Diagnosis not present

## 2016-04-27 DIAGNOSIS — J3089 Other allergic rhinitis: Secondary | ICD-10-CM | POA: Diagnosis not present

## 2016-05-04 DIAGNOSIS — J301 Allergic rhinitis due to pollen: Secondary | ICD-10-CM | POA: Diagnosis not present

## 2016-05-04 DIAGNOSIS — J3089 Other allergic rhinitis: Secondary | ICD-10-CM | POA: Diagnosis not present

## 2016-05-11 DIAGNOSIS — J301 Allergic rhinitis due to pollen: Secondary | ICD-10-CM | POA: Diagnosis not present

## 2016-05-11 DIAGNOSIS — J3089 Other allergic rhinitis: Secondary | ICD-10-CM | POA: Diagnosis not present

## 2016-05-24 DIAGNOSIS — J3089 Other allergic rhinitis: Secondary | ICD-10-CM | POA: Diagnosis not present

## 2016-05-24 DIAGNOSIS — J301 Allergic rhinitis due to pollen: Secondary | ICD-10-CM | POA: Diagnosis not present

## 2016-05-31 ENCOUNTER — Encounter: Payer: Self-pay | Admitting: Gynecology

## 2016-05-31 DIAGNOSIS — Z1231 Encounter for screening mammogram for malignant neoplasm of breast: Secondary | ICD-10-CM | POA: Diagnosis not present

## 2016-06-01 DIAGNOSIS — J3089 Other allergic rhinitis: Secondary | ICD-10-CM | POA: Diagnosis not present

## 2016-06-01 DIAGNOSIS — J301 Allergic rhinitis due to pollen: Secondary | ICD-10-CM | POA: Diagnosis not present

## 2016-06-15 DIAGNOSIS — J3089 Other allergic rhinitis: Secondary | ICD-10-CM | POA: Diagnosis not present

## 2016-06-15 DIAGNOSIS — J301 Allergic rhinitis due to pollen: Secondary | ICD-10-CM | POA: Diagnosis not present

## 2016-06-19 DIAGNOSIS — J3081 Allergic rhinitis due to animal (cat) (dog) hair and dander: Secondary | ICD-10-CM | POA: Diagnosis not present

## 2016-06-19 DIAGNOSIS — H1045 Other chronic allergic conjunctivitis: Secondary | ICD-10-CM | POA: Diagnosis not present

## 2016-06-19 DIAGNOSIS — J301 Allergic rhinitis due to pollen: Secondary | ICD-10-CM | POA: Diagnosis not present

## 2016-06-19 DIAGNOSIS — J3089 Other allergic rhinitis: Secondary | ICD-10-CM | POA: Diagnosis not present

## 2016-06-22 DIAGNOSIS — J3089 Other allergic rhinitis: Secondary | ICD-10-CM | POA: Diagnosis not present

## 2016-06-22 DIAGNOSIS — J301 Allergic rhinitis due to pollen: Secondary | ICD-10-CM | POA: Diagnosis not present

## 2016-06-22 DIAGNOSIS — K219 Gastro-esophageal reflux disease without esophagitis: Secondary | ICD-10-CM | POA: Diagnosis not present

## 2016-06-29 DIAGNOSIS — J301 Allergic rhinitis due to pollen: Secondary | ICD-10-CM | POA: Diagnosis not present

## 2016-06-29 DIAGNOSIS — J3089 Other allergic rhinitis: Secondary | ICD-10-CM | POA: Diagnosis not present

## 2016-07-05 DIAGNOSIS — H401331 Pigmentary glaucoma, bilateral, mild stage: Secondary | ICD-10-CM | POA: Diagnosis not present

## 2016-07-06 DIAGNOSIS — J3089 Other allergic rhinitis: Secondary | ICD-10-CM | POA: Diagnosis not present

## 2016-07-06 DIAGNOSIS — J301 Allergic rhinitis due to pollen: Secondary | ICD-10-CM | POA: Diagnosis not present

## 2016-07-13 DIAGNOSIS — J3089 Other allergic rhinitis: Secondary | ICD-10-CM | POA: Diagnosis not present

## 2016-07-13 DIAGNOSIS — J301 Allergic rhinitis due to pollen: Secondary | ICD-10-CM | POA: Diagnosis not present

## 2016-07-17 ENCOUNTER — Telehealth: Payer: Self-pay | Admitting: Pulmonary Disease

## 2016-07-17 MED ORDER — METHYLPREDNISOLONE 4 MG PO TBPK: ORAL_TABLET | ORAL | 0 refills | Status: DC

## 2016-07-17 MED ORDER — LEVOFLOXACIN 500 MG PO TABS: 500.0000 mg | ORAL_TABLET | Freq: Every day | ORAL | 0 refills | Status: DC

## 2016-07-17 NOTE — Telephone Encounter (Signed)
Spoke with pt. States that she thinks she has the flu. Reports body aches, headache, chills, cough, chest pain when coughing and sinus congestion. Sinuses and cough are producing discolored mucus. Denies chest tightness, wheezing, SOB or fever. Has been taking Mucinex with minimal relief. Symptoms started 3 days ago. Refused appointment due to not wanting to pick up any other germs from the office.  SN - please advise. Thanks.  Allergies  Allergen Reactions  . Codeine     REACTION: NAUSEA  . Erythromycin     REACTION: NAUSEA  . Other     Environmental allergies

## 2016-07-17 NOTE — Telephone Encounter (Signed)
Per SN--  Too late for tamiflu.    levaquin 500 mg  1 daily until gone.   Align once daily with the antibiotic.   Medrol dosepak #1  Take as directed per package. Rest Fluids Tylenol as needed.   Called and spoke with pt and she is aware of SN recs and that these meds have been sent to the pharmacy. Nothing further is needed.

## 2016-07-19 ENCOUNTER — Telehealth: Payer: Self-pay | Admitting: Pulmonary Disease

## 2016-07-19 MED ORDER — HYDROCODONE-HOMATROPINE 5-1.5 MG/5ML PO SYRP: 5.0000 mL | ORAL_SOLUTION | Freq: Four times a day (QID) | ORAL | 0 refills | Status: DC | PRN

## 2016-07-19 NOTE — Telephone Encounter (Signed)
Spoke with the pt  She called on 07/17/16 c/o cough and flu like symptoms SN called in medrol dosepack and levaquin  She states she can tell already that she is improving, but the cough is still pretty bothersome  She states occ has spells of violent cough that causes her to gag  Cough is keeping her awake at night and she is requesting rx for cough syrup  She declined ov due to weather  SN not available today  Please advise thanks

## 2016-07-19 NOTE — Telephone Encounter (Signed)
Called and spoke with pt and she is aware of rx that has been signed by VS and is up front and ready to be picked up. She will have someone come by and pick this up for her.

## 2016-07-19 NOTE — Telephone Encounter (Signed)
Can send script for hycodan 5 ml q6h prn.  Dispense 240 ml with no refills.

## 2016-07-24 ENCOUNTER — Ambulatory Visit (INDEPENDENT_AMBULATORY_CARE_PROVIDER_SITE_OTHER): Payer: Medicare Other | Admitting: Pulmonary Disease

## 2016-07-24 ENCOUNTER — Ambulatory Visit (INDEPENDENT_AMBULATORY_CARE_PROVIDER_SITE_OTHER)
Admission: RE | Admit: 2016-07-24 | Discharge: 2016-07-24 | Disposition: A | Discharge status: Home / Self Care | Payer: Medicare Other | Source: Ambulatory Visit | Attending: Pulmonary Disease | Admitting: Pulmonary Disease

## 2016-07-24 ENCOUNTER — Other Ambulatory Visit (INDEPENDENT_AMBULATORY_CARE_PROVIDER_SITE_OTHER): Payer: Medicare Other

## 2016-07-24 ENCOUNTER — Encounter: Payer: Self-pay | Admitting: Pulmonary Disease

## 2016-07-24 VITALS — BP 108/64 | HR 76 | Temp 97.4°F | Wt 102.5 lb

## 2016-07-24 DIAGNOSIS — I10 Essential (primary) hypertension: Secondary | ICD-10-CM

## 2016-07-24 DIAGNOSIS — R059 Cough, unspecified: Secondary | ICD-10-CM

## 2016-07-24 DIAGNOSIS — K219 Gastro-esophageal reflux disease without esophagitis: Secondary | ICD-10-CM | POA: Diagnosis not present

## 2016-07-24 DIAGNOSIS — F411 Generalized anxiety disorder: Secondary | ICD-10-CM

## 2016-07-24 DIAGNOSIS — R05 Cough: Secondary | ICD-10-CM

## 2016-07-24 DIAGNOSIS — R938 Abnormal findings on diagnostic imaging of other specified body structures: Secondary | ICD-10-CM | POA: Diagnosis not present

## 2016-07-24 DIAGNOSIS — E0590 Thyrotoxicosis, unspecified without thyrotoxic crisis or storm: Secondary | ICD-10-CM

## 2016-07-24 DIAGNOSIS — J45909 Unspecified asthma, uncomplicated: Secondary | ICD-10-CM | POA: Insufficient documentation

## 2016-07-24 DIAGNOSIS — R9389 Abnormal findings on diagnostic imaging of other specified body structures: Secondary | ICD-10-CM

## 2016-07-24 DIAGNOSIS — J301 Allergic rhinitis due to pollen: Secondary | ICD-10-CM

## 2016-07-24 DIAGNOSIS — J4530 Mild persistent asthma, uncomplicated: Secondary | ICD-10-CM

## 2016-07-24 LAB — COMPREHENSIVE METABOLIC PANEL
ALBUMIN: 4.1 g/dL (ref 3.5–5.2)
ALK PHOS: 78 U/L (ref 39–117)
ALT: 16 U/L (ref 0–35)
AST: 20 U/L (ref 0–37)
BILIRUBIN TOTAL: 0.3 mg/dL (ref 0.2–1.2)
BUN: 11 mg/dL (ref 6–23)
CALCIUM: 9.6 mg/dL (ref 8.4–10.5)
CHLORIDE: 92 meq/L — AB (ref 96–112)
CO2: 29 mEq/L (ref 19–32)
CREATININE: 0.82 mg/dL (ref 0.40–1.20)
GFR: 71.44 mL/min (ref >60.00)
Glucose, Bld: 94 mg/dL (ref 70–99)
Potassium: 4.2 mEq/L (ref 3.5–5.1)
Sodium: 129 mEq/L — ABNORMAL LOW (ref 135–145)
TOTAL PROTEIN: 7.1 g/dL (ref 6.0–8.3)

## 2016-07-24 LAB — CBC WITH DIFFERENTIAL/PLATELET
Basophils Absolute: 0 10*3/uL (ref 0.0–0.1)
Basophils Relative: 0.4 % (ref 0.0–3.0)
Eosinophils Absolute: 0 10*3/uL (ref 0.0–0.7)
Eosinophils Relative: 0.3 % (ref 0.0–5.0)
HCT: 45.2 % (ref 36.0–46.0)
Hemoglobin: 15.7 g/dL — ABNORMAL HIGH (ref 12.0–15.0)
Lymphocytes Relative: 24.8 % (ref 12.0–46.0)
Lymphs Abs: 1.6 10*3/uL (ref 0.7–4.0)
MCHC: 34.7 g/dL (ref 30.0–36.0)
MCV: 98.3 fl (ref 78.0–100.0)
Monocytes Absolute: 1.1 10*3/uL — ABNORMAL HIGH (ref 0.1–1.0)
Monocytes Relative: 16.4 % — ABNORMAL HIGH (ref 3.0–12.0)
Neutro Abs: 3.8 10*3/uL (ref 1.4–7.7)
Neutrophils Relative %: 58.1 % (ref 43.0–77.0)
Platelets: 278 10*3/uL (ref 150.0–400.0)
RBC: 4.6 Mil/uL (ref 3.87–5.11)
RDW: 12.1 % (ref 11.5–15.5)
WBC: 6.5 10*3/uL (ref 4.0–10.5)

## 2016-07-24 LAB — TSH: TSH: 1.06 u[IU]/mL (ref 0.35–4.50)

## 2016-07-24 MED ORDER — METHYLPREDNISOLONE 8 MG PO TABS: ORAL_TABLET | ORAL | 0 refills | Status: DC

## 2016-07-24 MED ORDER — METHYLPREDNISOLONE ACETATE 80 MG/ML IJ SUSP
80.0000 mg | Freq: Once | INTRAMUSCULAR | Status: AC
  Administered 2016-07-24: 80 mg via INTRAMUSCULAR

## 2016-07-24 MED ORDER — AZITHROMYCIN 250 MG PO TABS: ORAL_TABLET | ORAL | 0 refills | Status: DC

## 2016-07-24 NOTE — Patient Instructions (Signed)
Today we updated your med list in our EPIC system...    Continue your current medications the same...  Today we checked a follow up CXR & your yearly blood work...    We will contact you w/ the results when available...   We wrote for a Zithromax "ZPak" -- take this antibiotic as directed on the pack...  We gave you a Depo shot today... Starting tomorrow 07/25/16> take the MEDROL 8mg  tabs as follows>    Start w/ one tab twice daily for 4 days...    Then decrease to one tab each AM for 4 days...    Then decrease to 1/2 tab daily each AM for 4 days...    Then decrease to 1/2 tab every other day til gone (1/2, 0, 1/2, 0, etc).  Remember to take the OTC MUCINEX 1200mg  twice daily w/ lots of fluids...    Include some hot tea w/ lemon & honey, AND some CHICKEN SOUP (Momma said it's the best!)  Use the HYCODAN cough syrup as we discussed plus TYLENOL every 6H for the aching/ soreness...  Call for any questions or if we can be of service in any way.Marland KitchenMarland Kitchen

## 2016-07-24 NOTE — Progress Notes (Signed)
Subjective:    Patient ID: [name] [name], female    DOB: 1936-01-23, 81 y.o.   MRN: [id number]  HPI 81 y/o WF here for a 6 month follow up visit... she has multiple medical problems including HBP;  CAD/ hx palpit;  Divertics/ IBS/ Polyps/ Angiodysplasia;  LBP & FM;  Osteoporosis;  Anxiety...  ~  January 23, 2012:  31mo ROV & [name] [name] was Dx w/ TMJ several weeks ago & has new mouth guard- we discussed assoc w/ anxiety and she has Xanax & Desyrel...     She saw [name] [name] for an allergy f/u 6/13 & she was doing satis on allergy shots, Zyrtek & saline, no changes made...    She saw [name] [name] 6/13 for Cards f/u doing satis & no changes made to her med regimen...    We reviewed prob list, meds, xrays and labs> see below >> she already had her 2013 Flu vaccine...  ~  July 23, 2012:  80mo ROV & [name] [name] relates a good bit of stress since she is the caregiver of her family... We reviewed the following medical problems during today's office visit >>     AR> on Zyrtek & allergy shots per [name] [name]- seen 6/13 & stable...    HBP> on Amlodipine2.5mg  & BP= 128/68; denies HA, CP, palpit, SOB, edema, etc...    CAD & Palpit> on ASA81 & FishOil; notes some incr palpit w/ stress but [name] [name] helps; knows to avoid caffeine etc; she saw [name] [name] 6/12> EKG showed NSR, rate84, septal infarct (poor R prog V1-2, otherw neg, NAD; doing satis w/o new symptoms, no changes made...    Divertics & Colon Polyps> on Librax; followed by [name] [name] w/ hx IBS and f/u colon due in 2013=> we will refer...    Hx angiodysplasia> as noted, she denies any recent bleeding and Hg has been stable...    Gyn- [name] [name]> on Estradiol 0.08% one applic 2x/wk for dryness etc...    Hx LBP/ FM> on Flexeril prn and she copes very well...    Osteopenia> on Calcium, MVI, & still on Fosamax drug holiday (since 3/11 per [name] [name]) & repeat BMD 3/12 showed TScore -1.8 in Spine (see below); we decided to leave her off the Fosamax until f/u BMD  3/14=> pending...    Anxiety> on Xanax Prn & Desyrel 100mg /d; she is under a lot of stress still w/ husb illness & daugh divorce... We reviewed prob list, meds, xrays and labs> see below for updates >>   LABS 3/14:  FLP- at goals on diet x TG=170;  Chems- wnl;  CBC- wnl;  TSH=1.74;  UA- essent neg...  BMD 3/14 showed TScore -2.0 in Lumbar spine (was -1.8 in 2012); Rec to pt to consider re-start of Bisphos therapy but she declines & prefers to stay on Drug Holiday + supplements/ exercise...  ~  January 27, 2013:  88mo ROV & [name] [name] is stable, she has mult minor somatic complaints (not sleeping well, superfic varicosities, etc) but overall stable...     BP controlled on Amlod2.5 & BP= 114/68 w/o CP, palpit, SOB, edema, etc; followed for Cards by [name] [name] on ASA81 & prn NTG (hasn't needed)...     GI followed by [name] [name] w/ divertics, colon polyps and hx angiodysplasia; no bleeding episodes, uses Librax prn...    Hx LBP & FM- on Flex10 prn & doing satis w/ minor somatic complaints as noted...    Anxiety treated w/ Alpraz0.5 as needed & the Desyrel100 Qhs helps her rest...  We reviewed prob  list, meds, xrays and labs> see below for updates >> she had the 2014 Flu vaccine in Sept... Refills today as requested...   ~  July 28, 2013:  27mo ROV & [name] [name] notes that her allergies are acting up & she has some arthritis in left thumb- rec to take Zyrtek, Saline, and try hot soaks & Aleve prn; she is under a lot of stress at home w/ husb illness, not resting well, etc... We reviewed the following medical problems during today's office visit >>     AR> on Zyrtek & allergy shots per [name] [name]- seen 6/13 & stable...    HBP> on Amlodipine2.5mg  & BP= 120/82; denies HA, CP, palpit, SOB, edema, etc...    CAD & Palpit> on ASA81 & FishOil; notes some palpit w/ stress but [name] [name] helps; knows to avoid caffeine etc; she saw [name] [name] 6/12> EKG showed NSR, rate84, septal infarct (poor R prog V1-2, otherw neg, NAD);  doing satis w/o new symptoms...     Divertics & Colon Polyps> on Librax; followed by [name] [name] w/ hx IBS and last colon 2009 showed AVM in cecum (not bleeding) & 86mm hyperplastic polyp in sigmoid...    Hx angiodysplasia> as noted, she denies any recent bleeding and Hg has been stable...    Gyn- [name] [name]> on Estradiol 9.48% one applic 2x/wk for dryness etc; she was seen 3/15 & note reviewed; mammogram 1/15 was neg...    Hx LBP/ FM> on Flexeril prn and she copes very well...    Osteopenia> on Calcium, MVI, & still on Fosamax drug holiday (since 3/11 per [name] [name]/Fernandez) & BMD 3/12 showed TScore -1.8 in Spine (see below); we decided to leave her off the Fosamax until f/u BMD 6/14 w/ Tscore -2.0 in Spine; Rec to restart Bisphos therapy but she decided to continue her drug holiday + calcium, MVI, Vit D, and wt bearing exercise.    Anxiety> on Xanax Prn & Desyrel 100mg /d; she is under a lot of stress still w/ husb illness & daugh divorce... We reviewed prob list, meds, xrays and labs> see below for updates >> refills written per request...  CXR 3/15 showed norm heart size, clear lungs, NAD...  LABS 3/15:  FLP- at goals on diet w/ great HDL;  Chems- wnl;  CBC- wnl;  TSH=1.27  ~  March 27, 2016:  2.5 year ROV & pulmonary follow up visit>  [name] has established PCP- [name] [name], Cards- [name] [name], [place], Allergy- [name] [name]... [name] [name] tells me she went to her daughter's house in [place] over Thanksgiving and c/o a bad allergy attack, sinus infection, HA/ dizzy, cough w/ clear sput & some chest discomfort; she was seen by PCP & allergist=> given Depo, an antibiotic (Cefdinir), and cough syrup; CXR clear... Later changed to Augmentin + Astelin/Flonase, & Pred=> finally improved;  They were concerned over the CXR report indicating emphysema & aortic atherosclerosis and called for this follow up appt...     AR> on Zyrtek & allergy shots per [name] [name]- seen 2/17 & his note is reviewed...     HBP> on Amlodipine2.5mg  & Lasix20 prn; BP= 124/80; denies HA, CP, palpit, SOB, edema, etc...    CAD & Palpit> on ASA81 & FishOil; notes some palpit w/ stress but [name] [name] helps; knows to avoid caffeine etc; she saw [name] [name] 8/17> EKG showed NSR, rate86, septal infarct (poor R prog V1-2, otherw neg, NAD); doing satis w/o new symptoms...     Divertics & Colon Polyps> on Librax prn; followed by [name] [name] w/ hx IBS and last colon 2009  showed AVM in cecum (not bleeding) & 70mm hyperplastic polyp in sigmoid...    Hx angiodysplasia> as noted, she denies any recent bleeding and Hg has been stable...    Gyn- [name] [name]> on Estradiol 6.30% one applic 2x/wk for dryness etc; she was seen 4/17 & note reviewed- he follows mammograms and pelvic exams...    Hx LBP/ FM> on Flexeril & Tylenol prn and she copes very well...    Osteopenia> on Calcium, MVI, & still on Fosamax drug holiday (since 3/11 per [name] [name]/Fernandez) & BMD 4/16 showed TScore -1.8 in Spine (see below); which is stable off Fosamax; Rec to continue her drug holiday + calcium, MVI, Vit D, and wt bearing exercise.    Anxiety> on Xanax Prn & Desyrel 100mg /d; she is under a lot of stress still w/ husb illness & daugh divorce... EXAM shows Afeb, VSS, O2sat=95% on RA;  HEENT- meg, mallampati1;  Chest- clear w/o w/r/r;  Heart- RR w/o m/r/g;  Abd- thin, soft, nontender;  Ext- neg w/o c/c/e;  Neuro- intact...  FullPFTs 03/28/16>  FVC=2.28 (85%), FEV1=1.61 (81%), %1sec=71, mid-flows reduced at 65% predicted; after bronchodil FEV1=1.73 (7% incr);  TLC=4.31 (85%), RV=1.82 (76%), RV/TLC=42%;  DLCO=60% predicted & DL/VA=82%... This indicated mild airflow obstruction (small airways) and a mod decrease in DLCO...  IMP/PLAN>>  [name] [name] is reassured about her breathing & I do not believe that she would benefit from inhalers at this time- we will continue to monitor her breathing... She is prone to AB exac as evidenced at how long it takes to get over a URI;  She is up  to date on all indicated vaccinations;  It is poss that GERD & LPR are playing a roll here & she is advised to take an OTC H2 blocker Bid & antireflux regimen w. elev HOB 6" and NPO after dinner in the eve...   ~  July 24, 2016:  30mo ROV & add-on appt requested for persistent URI symptoms and cough>  [name] [name] notes that she started w/ severe cough about a wk ago, assoc w/ f/c/s, sl sore throat, chest congestion & the cough "just won't turn loose", small amt thick white sput, no dark color or blood; she denies SOB but has has some chest discomfort due to the cough;  We called in [place], & Hycodan w/ only min improvement so far;  We decided to check CXR, Labs, and adjust Rx using ZPak, Depo80, Medrol8mg  tabs in a slow taper, Mucinex 1200mg  bid + fluids, & adjust the Hycodan to her liking & tolerability (ie- 1-74ml prn cough)... I explained to [name] [name] how long some folks have been suffering w/ these exact symptoms recently...  EXAM shows Afeb, VSS, O2sat=95% on RA;  HEENT- meg, mallampati1;  Chest- clear w/o w/r/r;  Heart- RR w/o m/r/g;  Abd- thin, soft, nontender;  Ext- neg w/o c/c/e;  Neuro- intact...  CXR 07/24/16>  Norm heart size, clear lungs- NAD, sl hyperinflation noted...  LABS 07/24/16>  Chems- ok x Na=129, otherw wnl;  CBC- ok w/ Hg=15.7, WBC=6.5 w/ norm diff x 16% monos;  TSH= 1.06... IMP/PLAN>>  I believe that [name] [name] had an ILI w/ AB exac=> we discussed rx w/ ZPak, Depo80, Medtol8mg  tapering sched (see AVS), + Mucinex 1200mg  Bid & fluids=> ok to use soups, gatorade etc for the time being until she's feeling better; she has the Hycodan for cough & rest, now she just needs to give it some time. She knows to call for any questions... Note: >50% of this 25  min visit was spent w/ counseling 7 coordination of care.           Problem List:  Hx of SINUSITIS (ICD-473.9) - uses ZYRTEK OTC, Saline nasal wash, & FLONASE as needed... she notes that her allergies are bothering her and she saw  DrESL for allergy re-eval 7/12> still on shots... ~  6/13:  Now followed by DrVan Winkle> allergic rhinitis on shots since 1983, plus Zyrtek Prn & Saline Prn...  Hx of PNEUMONIA (ICD-486) >> resolved ~  CXR 3/12 showed clear lungs, NAD...  HYPERTENSION (ICD-401.9) - controlled on NORVASC 2.5mg /d... ~  9/12:  BP=136/82 & denies HA, fatigue, visual changes, CP, palipit, syncope, dyspnea, edema, etc... ~  3/13:  BP= 130/72 & she remains largely asymptomatic... ~  9/13:  BP= 124/62 & she denies visual symptoms, HA, CP, palpit, dizziness, SOB, edema, etc... ~  3/14:  on Amlodipine2.5mg  & BP= 128/68; denies HA, CP, palpit, SOB, edema, etc... ~  9/14: BP controlled on Amlod2.5 & BP= 114/68 w/o CP, palpit, SOB, edema, etc. ~  3/15: on Amlodipine2.5mg  & BP= 120/82; denies HA, CP, palpit, SOB, edema, etc...  CORONARY ARTERY DISEASE (ICD-414.00) & Hx of PALPITATIONS (ICD-785.1) - on ASA 81mg /d, and FISH OIL daily... hx coronary spasm w/ non-obstructive CAD on cath 1999 (20-30% diagonal branch LAD only)... sl anterobasal HK & EF=59%... she reports some incr palpit w/ stress- Alpraz 0.5mg  helps... ~  Baseline EKG showed NSR, septal infarct w/ poor R prog V1-2, otherw wnl/ NAD==> no change on 6/13 tracing. ~  She is followed by [name] [name] & seen yearly> OV 6/13 reviewed==> stable, no new symptoms, no changes made... ~  EKG 6/13 showed NSR, rate75, LAD, qs in V1-2, otherw neg... ~  She saw [name] [name] 6/14> HBP & palpit; noted mod anxiety, no CP or new palpit, syncope, etc; ?MVP, no murmur heard- stable & no changes made. ~  EKG 6/14 showed NSR, rate73, LAD, old septal infarct, NAD...   DIVERTICULOSIS OF COLON (ICD-562.10), IBS (ICD-564.1), & COLONIC POLYPS (ICD-211.3) >> ~  colonoscopy 12/03 by [name] [name] showing divertics only...  ~  eval 3/09 for hematochezia w/ Hg 13.5, Fe 103, repeat colon w/ 32mm polyp= hyperplastic + angiodysplasia... f/u planned 44yrs... ~  2/10: notes some increased abd cramping/ IBS-  we will refill her LIBRAX for Prn use...  Hx of COLONIC ANGIODYSPLASIA (ICD-569.84) - as above; no recent bleeding noted & Hg stable... ~  Colonoscopy 3/09 w/ AVM in cecum, not bleeding...   BACK PAIN, LUMBAR (ICD-724.2) - S/P LLam L4-5 yrs ago... ortho eval 9/06 by DrApplington w/ MRI showing DDD... conservative rx...  FIBROMYALGIA (ICD-729.1) - resting OK w/ TRAZADONE 100mg Qhs & FLEXERIL 10mg  Tid Prn...  OSTEOPOROSIS (ICD-733.00) - prev on FOSAMAX w/ D (she insisted on this Rx), +Calcium, +MVI...  ~  BMD 1/08 was improved w/ TScores -1.5 to -2.1 on FOSAMAX therapy...  ~  7/09:  she wants to discuss stopping Fosamax in favor of Vit K which she read in "[employer]: Personal" newsletter- mult questions answered... we will check Vit D level (normal= 63) and I rec that she continue the Fosamax, calcium supplements, multivit therapy... ~  2/10:  f/u BMD showed TScores -1.3 in Spine, & -1.8 in left FemNeck = improved... ~  3/11:  [name] [name] placed her on a Bisphos drug holiday... ~  3/12:  f/u BMD showed TScores -1.8 in Spine, & -1.5 in the left [facility] ~  3/13:  We reviewed prev results & decided to continue Calcium,  MVI, Vit D, & hold Bisphos therapy until f/u BMD due 3/14... ~  3/14:  F/u BMD shows TScore -2.0 in Spine and -1.6 in left FemNeck; Rec to restart Bisphos therapy but she decided to continue her drug holiday + calcium, MVI, Vit D, and wt bearing exercise...  ANXIETY (ICD-300.00) - uses ALPRAZOLAM 0.5mg  Tid Prn...  HEALTH MAINTENANCE:  she notes brother had shingles and we discussed the shingles vaccine- I rec that she get this shot at the health dept (NOTE: she reported mild case of Shingles on her left arm treated by [facility] in 2011)... she had PNEUMOVAX in 2007... gets yearly FLU SHOTs... GYN = [name] [name]/ Toney Rakes and he has her on Premarin Rx + vag cream; Neg Mamm[facility] 12/13 [facility]...   Past Surgical History:  Procedure Laterality Date  . CHOLECYSTECTOMY  03/2003  .  LUMBAR LAMINECTOMY    . TONSILLECTOMY     as a child    Outpatient Encounter Prescriptions as of 07/24/2016  Medication Sig  . ALPRAZolam (XANAX) 0.5 MG tablet TAKE 1/2 TO 1 TABLET THREE TIMES DAILY AS NEEDED.  Marland Kitchen amLODipine (NORVASC) 2.5 MG tablet Take 1 tablet (2.5 mg total) by mouth daily.  Marland Kitchen aspirin 81 MG tablet Take 81 mg by mouth daily.    Marland Kitchen BIOTIN 5000 PO Take by mouth.  . calcium carbonate (CALCIUM 600) 600 MG TABS Take 600 mg by mouth daily.   . cetirizine (ZYRTEC) 10 MG tablet Take 10 mg by mouth daily.    . Cholecalciferol (VITAMIN D) 2000 units tablet Take 2,000 Units by mouth daily.  . clidinium-chlordiazePOXIDE (LIBRAX) 5-2.5 MG capsule Take 1 capsule by mouth 3 (three) times daily as needed. For abd cramping  . cyclobenzaprine (FLEXERIL) 10 MG tablet Take 1 tablet (10 mg total) by mouth 3 (three) times daily as needed.  Marland Kitchen EPINEPHrine (EPI-PEN) 0.3 mg/0.3 mL DEVI Inject 0.3 mg into the muscle as needed.  . Estradiol POWD APPLY 1ML VAGINALLY TWICE WEEKLY.  . fish oil-omega-3 fatty acids 1000 MG capsule Take 1 g by mouth daily.    . furosemide (LASIX) 20 MG tablet Take 1 tablet (20 mg total) by mouth daily as needed.  Marland Kitchen HYDROcodone-homatropine (HYCODAN) 5-1.5 MG/5ML syrup Take 5 mLs by mouth every 6 (six) hours as needed for cough.  . Multiple Vitamins-Minerals (PROTEGRA PO) Take by mouth daily.    . multivitamin (THERAGRAN) per tablet Take 1 tablet by mouth daily.    . nitroGLYCERIN (NITROSTAT) 0.4 MG SL tablet Place 1 tablet (0.4 mg total) under the tongue every 5 (five) minutes as needed. May repeat 3 times  . NONFORMULARY OR COMPOUNDED ITEM Estradiol .02% 1 ML Prefilled Applicator Sig: apply vaginally twice a week #90 Day Supply with 4 refills  . traZODone (DESYREL) 100 MG tablet TAKE ONE TABLET AT BEDTIME.  Marland Kitchen UNABLE TO FIND Allergy shots weekly  . azithromycin (ZITHROMAX) 250 MG tablet Take as directed per package  . methylPREDNISolone (MEDROL) 8 MG tablet Take as  directed  . [DISCONTINUED] levofloxacin (LEVAQUIN) 500 MG tablet Take 1 tablet (500 mg total) by mouth daily. (Patient not taking: Reported on 07/24/2016)  . [DISCONTINUED] methylPREDNISolone (MEDROL) 4 MG TBPK tablet Take as directed (Patient not taking: Reported on 07/24/2016)  . [EXPIRED] methylPREDNISolone acetate (DEPO-MEDROL) injection 80 mg    No facility-administered encounter medications on file as of 07/24/2016.     Allergies  Allergen Reactions  . Codeine     REACTION: NAUSEA  . Erythromycin  REACTION: NAUSEA  . Other     Environmental allergies    Current Medications, Allergies, Past Medical History, Past Surgical History, Family History, and Social History were reviewed in [employer] record.    Review of Systems  Constitutional:  Denies F/C/S, anorexia, unexpected weight change. HEENT:  No HA, visual changes, earache, nasal symptoms, sore throat, hoarseness. Resp:  cough, sputum, no hemoptysis; no SOB, +tightness, +wheezing. Cardio:  No CP, DOE, orthopnea, edema. Notes occas palpit... GI:  Denies N/V/D/C or blood in stool; no reflux, abd pain, distention, or gas. GU:  No dysuria, freq, urgency, hematuria, or flank pain. MS:  Denies joint pain, swelling, tenderness, or decr ROM; no neck pain, back pain, etc. Neuro:  No tremors, seizures, dizziness, syncope, weakness, numbness, gait abn. Skin:  No suspicious lesions; she notes skin rash eval DrHouston. Heme:  No adenopathy, bruising, bleeding. Psyche: Denies confusion, sleep disturbance, hallucinations, anxiety, depression.     Objective:   Physical Exam   WD, WN, 82 y/o WF in NAD... Vital Signs:  Reviewed...   General:  Alert & oriented; pleasant & cooperative... HEENT:  [place]/AT, EOM-wnl, PERRLA, Fundi-benign, EACs-clear, TMs-wnl, NOSE-clear, THROAT-clear & wnl.  Neck:  Supple w/ fair ROM; no JVD; normal carotid impulses w/o bruits; no thyromegaly or nodules palpated; no lymphadenopathy.   Chest:  Congested cough, without wheezes/ rales/ or rhonchi heard... Heart:  Regular Rhythm; norm S1 & S2 without murmurs/ rubs/ or gallops detected... Abdomen:  Soft & nontender; normal bowel sounds; no organomegaly or masses palpated... Ext:  Normal ROM; without deformities or arthritic changes; no varicose veins, venous insuffic, or edema;  Pulses intact w/o bruits... Neuro:  CNs II-XII intact; motor testing normal; sensory testing normal; gait normal & balance OK... Derm:  No lesions noted; no rash etc... Lymph:  No cervical, supraclavicular, axillary, or inguinal adenopathy palpated...  RADIOLOGY DATA:  Reviewed in the EPIC EMR & discussed w/ the patient...  LABORATORY DATA:  Reviewed in the EPIC EMR & discussed w/ the patient...   Assessment & Plan:    Asthmatic Bronchitis>  She's had a hard time getting over this URI, persistent airway inflamm, congestion, cough, etc => finally better after 2 rounds of antibiotics, Pred, cough syrup, etc... ~  She also has some reflux  LPR>  rx w/ H2 blocker OTC Bid + antireflux regimen w/ NPO after dinner, & elev HOB etc... ~  3/18:  She's had another URI-ILI w/ refractory cough, chest congestion, etc=> we decided to Rx w/ ZPak, Depo, Medrol taper (see AVS), Mucinex, fluids, Hycodan, etc...   HBP> on Amlodipine2.5mg  & BP well controlled & stable; continue same...     CAD & Palpit> on ASA & FishOil; followed by [name] [name]> doing satis w/o new symptoms, no changes made...     Divertics & Colon Polyps> on Librax; followed by [name] [name] w/ hx IBS and stable...     Hx Angiodysplasia> AVM in cecum 3/09 colon; she denies any recent bleeding and Hg has been stable...     Hx LBP/ FM> on Flexeril prn and she copes very well...     Osteopenia> on Calcium, MVI, & Fosamax drug holiday since 3/11 per [name] [name] & repeat BMD 3/12 showed TScore -1.8 in Spine and 3/14 showed TScore -2.0; she declines restart Bisphos & prefers to continue drug holiday...      Anxiety> on Xanax Prn & Desyrel 100mg /d; she is under a lot of stress still...   Patient's Medications  New Prescriptions   AZITHROMYCIN (  ZITHROMAX) 250 MG TABLET    Take as directed per package   METHYLPREDNISOLONE (MEDROL) 8 MG TABLET    Take as directed  Previous Medications   ALPRAZOLAM (XANAX) 0.5 MG TABLET    TAKE 1/2 TO 1 TABLET THREE TIMES DAILY AS NEEDED.   AMLODIPINE (NORVASC) 2.5 MG TABLET    Take 1 tablet (2.5 mg total) by mouth daily.   ASPIRIN 81 MG TABLET    Take 81 mg by mouth daily.     BIOTIN 5000 PO    Take by mouth.   CALCIUM CARBONATE (CALCIUM 600) 600 MG TABS    Take 600 mg by mouth daily.    CETIRIZINE (ZYRTEC) 10 MG TABLET    Take 10 mg by mouth daily.     CHOLECALCIFEROL (VITAMIN D) 2000 UNITS TABLET    Take 2,000 Units by mouth daily.   CLIDINIUM-CHLORDIAZEPOXIDE (LIBRAX) 5-2.5 MG CAPSULE    Take 1 capsule by mouth 3 (three) times daily as needed. For abd cramping   CYCLOBENZAPRINE (FLEXERIL) 10 MG TABLET    Take 1 tablet (10 mg total) by mouth 3 (three) times daily as needed.   EPINEPHRINE (EPI-PEN) 0.3 MG/0.3 ML DEVI    Inject 0.3 mg into the muscle as needed.   ESTRADIOL POWD    APPLY 1ML VAGINALLY TWICE WEEKLY.   FISH OIL-OMEGA-3 FATTY ACIDS 1000 MG CAPSULE    Take 1 g by mouth daily.     FUROSEMIDE (LASIX) 20 MG TABLET    Take 1 tablet (20 mg total) by mouth daily as needed.   HYDROCODONE-HOMATROPINE (HYCODAN) 5-1.5 MG/5ML SYRUP    Take 5 mLs by mouth every 6 (six) hours as needed for cough.   MULTIPLE VITAMINS-MINERALS (PROTEGRA PO)    Take by mouth daily.     MULTIVITAMIN (THERAGRAN) PER TABLET    Take 1 tablet by mouth daily.     NITROGLYCERIN (NITROSTAT) 0.4 MG SL TABLET    Place 1 tablet (0.4 mg total) under the tongue every 5 (five) minutes as needed. May repeat 3 times   NONFORMULARY OR COMPOUNDED ITEM    Estradiol .02% 1 ML Prefilled Applicator Sig: apply vaginally twice a week #90 Day Supply with 4 refills   TRAZODONE (DESYREL) 100 MG TABLET     TAKE ONE TABLET AT BEDTIME.   UNABLE TO FIND    Allergy shots weekly  Modified Medications   No medications on file  Discontinued Medications   LEVOFLOXACIN (LEVAQUIN) 500 MG TABLET    Take 1 tablet (500 mg total) by mouth daily.   METHYLPREDNISOLONE (MEDROL) 4 MG TBPK TABLET    Take as directed

## 2016-08-14 ENCOUNTER — Other Ambulatory Visit (INDEPENDENT_AMBULATORY_CARE_PROVIDER_SITE_OTHER): Payer: Medicare Other

## 2016-08-14 ENCOUNTER — Encounter: Payer: Self-pay | Admitting: Internal Medicine

## 2016-08-14 ENCOUNTER — Ambulatory Visit (INDEPENDENT_AMBULATORY_CARE_PROVIDER_SITE_OTHER): Payer: Medicare Other | Admitting: Internal Medicine

## 2016-08-14 VITALS — BP 136/80 | HR 83 | Temp 97.7°F | Resp 14 | Ht 63.5 in | Wt 103.0 lb

## 2016-08-14 DIAGNOSIS — M8580 Other specified disorders of bone density and structure, unspecified site: Secondary | ICD-10-CM

## 2016-08-14 DIAGNOSIS — E2839 Other primary ovarian failure: Secondary | ICD-10-CM

## 2016-08-14 DIAGNOSIS — Z0000 Encounter for general adult medical examination without abnormal findings: Secondary | ICD-10-CM

## 2016-08-14 DIAGNOSIS — I10 Essential (primary) hypertension: Secondary | ICD-10-CM

## 2016-08-14 DIAGNOSIS — G4700 Insomnia, unspecified: Secondary | ICD-10-CM

## 2016-08-14 DIAGNOSIS — F411 Generalized anxiety disorder: Secondary | ICD-10-CM

## 2016-08-14 DIAGNOSIS — E785 Hyperlipidemia, unspecified: Secondary | ICD-10-CM

## 2016-08-14 LAB — LIPID PANEL
CHOL/HDL RATIO: 2
Cholesterol: 213 mg/dL — ABNORMAL HIGH (ref 0–200)
HDL: 107.1 mg/dL (ref >39.00)
LDL CALC: 91 mg/dL (ref 0–99)
NONHDL: 106.13
TRIGLYCERIDES: 75 mg/dL (ref 0.0–149.0)
VLDL: 15 mg/dL (ref 0.0–40.0)

## 2016-08-14 MED ORDER — AMLODIPINE BESYLATE 2.5 MG PO TABS: 2.5000 mg | ORAL_TABLET | Freq: Every day | ORAL | 3 refills | Status: DC

## 2016-08-14 MED ORDER — TRAZODONE HCL 100 MG PO TABS: ORAL_TABLET | ORAL | 3 refills | Status: DC

## 2016-08-14 MED ORDER — ZOSTER VAC RECOMB ADJUVANTED 50 MCG/0.5ML IM SUSR: 0.5000 mL | Freq: Once | INTRAMUSCULAR | 1 refills | Status: AC

## 2016-08-14 MED ORDER — ALPRAZOLAM 0.5 MG PO TABS: ORAL_TABLET | ORAL | 0 refills | Status: DC

## 2016-08-14 NOTE — Assessment & Plan Note (Signed)
Does yearly derm check and counseled on sun safety and mole surveillance. Given rx for shingrix. Missed flu shot this year. Pneumonia and tetanus up to date. Colonoscopy up to date and will age out before next screening. Mammogram up to date. Given 10 year screening recommendations.

## 2016-08-14 NOTE — Patient Instructions (Addendum)
We have given you the prescription for the new shingles shot called shingrix. You can take this to the pharmacy to get it.   Health Maintenance, Female Adopting a healthy lifestyle and getting preventive care can go a long way to promote health and wellness. Talk with your health care provider about what schedule of regular examinations is right for you. This is a good chance for you to check in with your provider about disease prevention and staying healthy. In between checkups, there are plenty of things you can do on your own. Experts have done a lot of research about which lifestyle changes and preventive measures are most likely to keep you healthy. Ask your health care provider for more information. Weight and diet Eat a healthy diet  Be sure to include plenty of vegetables, fruits, low-fat dairy products, and lean protein.  Do not eat a lot of foods high in solid fats, added sugars, or salt.  Get regular exercise. This is one of the most important things you can do for your health.  Most adults should exercise for at least 150 minutes each week. The exercise should increase your heart rate and make you sweat (moderate-intensity exercise).  Most adults should also do strengthening exercises at least twice a week. This is in addition to the moderate-intensity exercise. Maintain a healthy weight  Body mass index (BMI) is a measurement that can be used to identify possible weight problems. It estimates body fat based on height and weight. Your health care provider can help determine your BMI and help you achieve or maintain a healthy weight.  For females 61 years of age and older:  A BMI below 18.5 is considered underweight.  A BMI of 18.5 to 24.9 is normal.  A BMI of 25 to 29.9 is considered overweight.  A BMI of 30 and above is considered obese. Watch levels of cholesterol and blood lipids  You should start having your blood tested for lipids and cholesterol at 79 years of age,  then have this test every 5 years.  You may need to have your cholesterol levels checked more often if:  Your lipid or cholesterol levels are high.  You are older than 79 years of age.  You are at high risk for heart disease. Cancer screening Lung Cancer  Lung cancer screening is recommended for adults 15-56 years old who are at high risk for lung cancer because of a history of smoking.  A yearly low-dose CT scan of the lungs is recommended for people who:  Currently smoke.  Have quit within the past 15 years.  Have at least a 30-pack-year history of smoking. A pack year is smoking an average of one pack of cigarettes a day for 1 year.  Yearly screening should continue until it has been 15 years since you quit.  Yearly screening should stop if you develop a health problem that would prevent you from having lung cancer treatment. Breast Cancer  Practice breast self-awareness. This means understanding how your breasts normally appear and feel.  It also means doing regular breast self-exams. Let your health care provider know about any changes, no matter how small.  If you are in your 20s or 30s, you should have a clinical breast exam (CBE) by a health care provider every 1-3 years as part of a regular health exam.  If you are 31 or older, have a CBE every year. Also consider having a breast X-ray (mammogram) every year.  If you have a  family history of breast cancer, talk to your health care provider about genetic screening.  If you are at high risk for breast cancer, talk to your health care provider about having an MRI and a mammogram every year.  Breast cancer gene (BRCA) assessment is recommended for women who have family members with BRCA-related cancers. BRCA-related cancers include:  Breast.  Ovarian.  Tubal.  Peritoneal cancers.  Results of the assessment will determine the need for genetic counseling and BRCA1 and BRCA2 testing. Cervical Cancer  Your health  care provider may recommend that you be screened regularly for cancer of the pelvic organs (ovaries, uterus, and vagina). This screening involves a pelvic examination, including checking for microscopic changes to the surface of your cervix (Pap test). You may be encouraged to have this screening done every 3 years, beginning at age 53.  For women ages 41-65, health care providers may recommend pelvic exams and Pap testing every 3 years, or they may recommend the Pap and pelvic exam, combined with testing for human papilloma virus (HPV), every 5 years. Some types of HPV increase your risk of cervical cancer. Testing for HPV may also be done on women of any age with unclear Pap test results.  Other health care providers may not recommend any screening for nonpregnant women who are considered low risk for pelvic cancer and who do not have symptoms. Ask your health care provider if a screening pelvic exam is right for you.  If you have had past treatment for cervical cancer or a condition that could lead to cancer, you need Pap tests and screening for cancer for at least 20 years after your treatment. If Pap tests have been discontinued, your risk factors (such as having a new sexual partner) need to be reassessed to determine if screening should resume. Some women have medical problems that increase the chance of getting cervical cancer. In these cases, your health care provider may recommend more frequent screening and Pap tests. Colorectal Cancer  This type of cancer can be detected and often prevented.  Routine colorectal cancer screening usually begins at 79 years of age and continues through 79 years of age.  Your health care provider may recommend screening at an earlier age if you have risk factors for colon cancer.  Your health care provider may also recommend using home test kits to check for hidden blood in the stool.  A small camera at the end of a tube can be used to examine your colon  directly (sigmoidoscopy or colonoscopy). This is done to check for the earliest forms of colorectal cancer.  Routine screening usually begins at age 31.  Direct examination of the colon should be repeated every 5-10 years through 79 years of age. However, you may need to be screened more often if early forms of precancerous polyps or small growths are found. Skin Cancer  Check your skin from head to toe regularly.  Tell your health care provider about any new moles or changes in moles, especially if there is a change in a mole's shape or color.  Also tell your health care provider if you have a mole that is larger than the size of a pencil eraser.  Always use sunscreen. Apply sunscreen liberally and repeatedly throughout the day.  Protect yourself by wearing long sleeves, pants, a wide-brimmed hat, and sunglasses whenever you are outside. Heart disease, diabetes, and high blood pressure  High blood pressure causes heart disease and increases the risk of stroke. High blood  pressure is more likely to develop in:  People who have blood pressure in the high end of the normal range (130-139/85-89 mm Hg).  People who are overweight or obese.  People who are African American.  If you are 33-37 years of age, have your blood pressure checked every 3-5 years. If you are 63 years of age or older, have your blood pressure checked every year. You should have your blood pressure measured twice-once when you are at a hospital or clinic, and once when you are not at a hospital or clinic. Record the average of the two measurements. To check your blood pressure when you are not at a hospital or clinic, you can use:  An automated blood pressure machine at a pharmacy.  A home blood pressure monitor.  If you are between 54 years and 24 years old, ask your health care provider if you should take aspirin to prevent strokes.  Have regular diabetes screenings. This involves taking a blood sample to check  your fasting blood sugar level.  If you are at a normal weight and have a low risk for diabetes, have this test once every three years after 79 years of age.  If you are overweight and have a high risk for diabetes, consider being tested at a younger age or more often. Preventing infection Hepatitis B  If you have a higher risk for hepatitis B, you should be screened for this virus. You are considered at high risk for hepatitis B if:  You were born in a country where hepatitis B is common. Ask your health care provider which countries are considered high risk.  Your parents were born in a high-risk country, and you have not been immunized against hepatitis B (hepatitis B vaccine).  You have HIV or AIDS.  You use needles to inject street drugs.  You live with someone who has hepatitis B.  You have had sex with someone who has hepatitis B.  You get hemodialysis treatment.  You take certain medicines for conditions, including cancer, organ transplantation, and autoimmune conditions. Hepatitis C  Blood testing is recommended for:  Everyone born from [birth year] through 1963.  Anyone with known risk factors for hepatitis C. Sexually transmitted infections (STIs)  You should be screened for sexually transmitted infections (STIs) including gonorrhea and chlamydia if:  You are sexually active and are younger than 79 years of age.  You are older than 79 years of age and your health care provider tells you that you are at risk for this type of infection.  Your sexual activity has changed since you were last screened and you are at an increased risk for chlamydia or gonorrhea. Ask your health care provider if you are at risk.  If you do not have HIV, but are at risk, it may be recommended that you take a prescription medicine daily to prevent HIV infection. This is called pre-exposure prophylaxis (PrEP). You are considered at risk if:  You are sexually active and do not regularly use  condoms or know the HIV status of your partner(s).  You take drugs by injection.  You are sexually active with a partner who has HIV. Talk with your health care provider about whether you are at high risk of being infected with HIV. If you choose to begin PrEP, you should first be tested for HIV. You should then be tested every 3 months for as long as you are taking PrEP. Pregnancy  If you are premenopausal and you may  become pregnant, ask your health care provider about preconception counseling.  If you may become pregnant, take 400 to 800 micrograms (mcg) of folic acid every day.  If you want to prevent pregnancy, talk to your health care provider about birth control (contraception). Osteoporosis and menopause  Osteoporosis is a disease in which the bones lose minerals and strength with aging. This can result in serious bone fractures. Your risk for osteoporosis can be identified using a bone density scan.  If you are 65 years of age or older, or if you are at risk for osteoporosis and fractures, ask your health care provider if you should be screened.  Ask your health care provider whether you should take a calcium or vitamin D supplement to lower your risk for osteoporosis.  Menopause may have certain physical symptoms and risks.  Hormone replacement therapy may reduce some of these symptoms and risks. Talk to your health care provider about whether hormone replacement therapy is right for you. Follow these instructions at home:  Schedule regular health, dental, and eye exams.  Stay current with your immunizations.  Do not use any tobacco products including cigarettes, chewing tobacco, or electronic cigarettes.  If you are pregnant, do not drink alcohol.  If you are breastfeeding, limit how much and how often you drink alcohol.  Limit alcohol intake to no more than 1 drink per day for nonpregnant women. One drink equals 12 ounces of beer, 5 ounces of wine, or 1 ounces of  hard liquor.  Do not use street drugs.  Do not share needles.  Ask your health care provider for help if you need support or information about quitting drugs.  Tell your health care provider if you often feel depressed.  Tell your health care provider if you have ever been abused or do not feel safe at home. This information is not intended to replace advice given to you by your health care provider. Make sure you discuss any questions you have with your health care provider. Document Released: 10/31/2010 Document Revised: 09/23/2015 Document Reviewed: 01/19/2015 Elsevier Interactive Patient Education  2017 [employer].

## 2016-08-14 NOTE — Assessment & Plan Note (Signed)
BP at goal on amlodipine and rare lasix for fluid. Recent CMP at goal and no need for recheck at this time. No side effects.

## 2016-08-14 NOTE — Assessment & Plan Note (Signed)
Refill xanax which she is using rarely. She is doing better overall with coping and has good support network.

## 2016-08-14 NOTE — Progress Notes (Signed)
   Subjective:    Patient ID: [name] [name], female    DOB: 1936/02/21, 81 y.o.   MRN: [id number]  HPI Here for medicare wellness, no new complaints. Please see A/P for status and treatment of chronic medical problems.   HPI #2: Here for follow up of her anxiety (from loss of spouse, taking xanax rarely and 70 lasted since December, no SI/HI, no depression and she has good support system), and blood pressure (taking amlodipine and lasix prn fluid, BP at goal, no side effects, denies chest pains or SOB or abdominal pain or headaches) and her sleeping (taking trazodone without problems, is sleeping well on trazodone, no hangover symptoms in the morning, wears off as it is supposed to). Getting over cough/cold this winter and just finished medication yesterday.   Diet: heart healthy Physical activity: sedentary Depression/mood screen: negative Hearing: intact to whispered voice Visual acuity: grossly normal with lens, performs annual eye exam  ADLs: capable Fall risk: none Home safety: good Cognitive evaluation: intact to orientation, naming, recall and repetition EOL planning: adv directives discussed  I have personally reviewed and have noted 1. The patient's medical and social history - reviewed today no changes 2. Their use of alcohol, tobacco or illicit drugs 3. Their current medications and supplements 4. The patient's functional ability including ADL's, fall risks, home safety risks and hearing or visual impairment. 5. Diet and physical activities 6. Evidence for depression or mood disorders 7. Care team reviewed and updated (available in snapshot)  Review of Systems  Constitutional: Positive for activity change. Negative for appetite change, fatigue, fever and unexpected weight change.  HENT: Negative.   Eyes: Negative.   Respiratory: Negative for cough, chest tightness and shortness of breath.   Cardiovascular: Negative for chest pain, palpitations and leg swelling.    Gastrointestinal: Negative for abdominal distention, abdominal pain, constipation, diarrhea, nausea and vomiting.  Musculoskeletal: Negative.   Skin: Negative.   Neurological: Negative.   Psychiatric/Behavioral: Negative.      Objective:   Physical Exam  Constitutional: She is oriented to person, place, and time. She appears well-developed and well-nourished.  thin  HENT:  Head: Normocephalic and atraumatic.  Eyes: EOM are normal.  Neck: Normal range of motion.  Cardiovascular: Normal rate and regular rhythm.   Pulmonary/Chest: Effort normal and breath sounds normal. No respiratory distress. She has no wheezes. She has no rales.  Abdominal: Soft. Bowel sounds are normal. She exhibits no distension. There is no tenderness. There is no rebound.  Musculoskeletal: She exhibits no edema.  Neurological: She is alert and oriented to person, place, and time. Coordination normal.  Skin: Skin is warm and dry.  Psychiatric: She has a normal mood and affect.   Vitals:   08/14/16 0823  BP: 136/80  Pulse: 83  Resp: 14  Temp: 97.7 F (36.5 C)  TempSrc: Oral  SpO2: 98%  Weight: 103 lb (46.7 kg)  Height: 5' 3.5" (1.613 m)      Assessment & Plan:

## 2016-08-14 NOTE — Assessment & Plan Note (Signed)
Refill trazodone and no side effects and still effective.

## 2016-08-14 NOTE — Assessment & Plan Note (Signed)
Ordered bone density for follow up as due this year.

## 2016-08-14 NOTE — Progress Notes (Signed)
Pre visit review using our clinic review tool, if applicable. No additional management support is needed unless otherwise documented below in the visit note. 

## 2016-08-17 DIAGNOSIS — J3089 Other allergic rhinitis: Secondary | ICD-10-CM | POA: Diagnosis not present

## 2016-08-17 DIAGNOSIS — J301 Allergic rhinitis due to pollen: Secondary | ICD-10-CM | POA: Diagnosis not present

## 2016-08-24 DIAGNOSIS — J3089 Other allergic rhinitis: Secondary | ICD-10-CM | POA: Diagnosis not present

## 2016-08-24 DIAGNOSIS — J301 Allergic rhinitis due to pollen: Secondary | ICD-10-CM | POA: Diagnosis not present

## 2016-08-28 ENCOUNTER — Ambulatory Visit (INDEPENDENT_AMBULATORY_CARE_PROVIDER_SITE_OTHER)
Admission: RE | Admit: 2016-08-28 | Discharge: 2016-08-28 | Disposition: A | Discharge status: Home / Self Care | Payer: Medicare Other | Source: Ambulatory Visit | Attending: Internal Medicine | Admitting: Internal Medicine

## 2016-08-28 ENCOUNTER — Encounter: Payer: Self-pay | Admitting: Gynecology

## 2016-08-28 ENCOUNTER — Ambulatory Visit (INDEPENDENT_AMBULATORY_CARE_PROVIDER_SITE_OTHER): Payer: Medicare Other | Admitting: Gynecology

## 2016-08-28 VITALS — BP 92/64 | Ht 64.25 in | Wt 106.4 lb

## 2016-08-28 DIAGNOSIS — Z01419 Encounter for gynecological examination (general) (routine) without abnormal findings: Secondary | ICD-10-CM

## 2016-08-28 DIAGNOSIS — E2839 Other primary ovarian failure: Secondary | ICD-10-CM | POA: Diagnosis not present

## 2016-08-28 DIAGNOSIS — M8580 Other specified disorders of bone density and structure, unspecified site: Secondary | ICD-10-CM

## 2016-08-28 NOTE — Patient Instructions (Signed)
Bone Densitometry Bone densitometry is an imaging test that uses a special X-ray to measure the amount of calcium and other minerals in your bones (bone density). This test is also known as a bone mineral density test or dual-energy X-ray absorptiometry (DXA). The test can measure bone density at your hip and your spine. It is similar to having a regular X-ray. You may have this test to:  Diagnose a condition that causes weak or thin bones (osteoporosis).  Predict your risk of a broken bone (fracture).  Determine how well osteoporosis treatment is working. Tell a health care provider about:  Any allergies you have.  All medicines you are taking, including vitamins, herbs, eye drops, creams, and over-the-counter medicines.  Any problems you or family members have had with anesthetic medicines.  Any blood disorders you have.  Any surgeries you have had.  Any medical conditions you have.  Possibility of pregnancy.  Any other medical test you had within the previous 14 days that used contrast material. What are the risks? Generally, this is a safe procedure. However, problems can occur and may include the following:  This test exposes you to a very small amount of radiation.  The risks of radiation exposure may be greater to unborn children. What happens before the procedure?  Do not take any calcium supplements for 24 hours before having the test. You can otherwise eat and drink what you usually do.  Take off all metal jewelry, eyeglasses, dental appliances, and any other metal objects. What happens during the procedure?  You may lie on an exam table. There will be an X-ray generator below you and an imaging device above you.  Other devices, such as boxes or braces, may be used to position your body properly for the scan.  You will need to lie still while the machine slowly scans your body.  The images will show up on a computer monitor. What happens after the  procedure? You may need more testing at a later time. This information is not intended to replace advice given to you by your health care provider. Make sure you discuss any questions you have with your health care provider. Document Released: 05/09/2004 Document Revised: 09/23/2015 Document Reviewed: 09/25/2013 Elsevier Interactive Patient Education  2017 [employer]  

## 2016-08-28 NOTE — Progress Notes (Signed)
[name] [name] 1936-03-04 [id number]   History:    81 y.o.  for annual gyn exam with no complaints today. Patient's husband passed away recently and she's coping well has good support and takes her Xanax when necessary and her antidepressant when necessary provided by her PCP. Patient's last colonoscopy was in 2009 many years ago she had benign colon polyps. Her last bone density study was in 2016 and she scheduled for bone density study later today.Patient reports no vaginal bleeding.Review of patient's records indicated that in the past and she had had history of osteoporosis and had been on Fosamax for 5 years and discontinued in 2012 is currently on a drug holiday. Her last bone density study was in 2017 demonstrated her lowest T score was -1.8 at the [facility]. They had done a Frax analysis based on her hip score and her values were subthreshold. Patient is taking her calcium and vitamin D and does weightbearing exercises. Her PCP has been doing her blood work and her vaccines are up-to-date.Patient with no past history of any abnormal Pap smear.  Past medical history,surgical history, family history and social history were all reviewed and documented in the EPIC chart.  Gynecologic History No LMP recorded. Patient is postmenopausal. Contraception: post menopausal status Last Pap: 2012. Results were: normal Last mammogram: 2018. Results were: normal  Obstetric History OB History  Gravida Para Term Preterm AB Living  1 1 1    0 1  SAB TAB Ectopic Multiple Live Births               # Outcome Date GA Lbr Len/2nd Weight Sex Delivery Anes PTL Lv  1 Term                ROS: A ROS was performed and pertinent positives and negatives are included in the history.  GENERAL: No fevers or chills. HEENT: No change in vision, no earache, sore throat or sinus congestion. NECK: No pain or stiffness. CARDIOVASCULAR: No chest pain or pressure. No palpitations. PULMONARY: No shortness of breath,  cough or wheeze. GASTROINTESTINAL: No abdominal pain, nausea, vomiting or diarrhea, melena or bright red blood per rectum. GENITOURINARY: No urinary frequency, urgency, hesitancy or dysuria. MUSCULOSKELETAL: No joint or muscle pain, no back pain, no recent trauma. DERMATOLOGIC: No rash, no itching, no lesions. ENDOCRINE: No polyuria, polydipsia, no heat or cold intolerance. No recent change in weight. HEMATOLOGICAL: No anemia or easy bruising or bleeding. NEUROLOGIC: No headache, seizures, numbness, tingling or weakness. PSYCHIATRIC: No depression, no loss of interest in normal activity or change in sleep pattern.     Exam: chaperone present  BP 92/64   Ht 5' 4.25" (1.632 m)   Wt 106 lb 6.4 oz (48.3 kg)   BMI 18.12 kg/m   Body mass index is 18.12 kg/m.  General appearance : Well developed well nourished female. No acute distress HEENT: Eyes: no retinal hemorrhage or exudates,  Neck supple, trachea midline, no carotid bruits, no thyroidmegaly Lungs: Clear to auscultation, no rhonchi or wheezes, or rib retractions  Heart: Regular rate and rhythm, no murmurs or gallops Breast:Examined in sitting and supine position were symmetrical in appearance, no palpable masses or tenderness,  no skin retraction, no nipple inversion, no nipple discharge, no skin discoloration, no axillary or supraclavicular lymphadenopathy Abdomen: no palpable masses or tenderness, no rebound or guarding Extremities: no edema or skin discoloration or tenderness  Pelvic:  Bartholin, Urethra, Skene Glands: Within normal limits  Vagina: No gross lesions or discharge, atrophic changes  Cervix: No gross lesions or discharge  Uterus  anteverted, normal size, shape and consistency, non-tender and mobile  Adnexa  Without masses or tenderness  Anus and perineum  normal   Rectovaginal  normal sphincter tone without palpated masses or tenderness             Hemoccult PCP provides     Assessment/Plan:  81 y.o.  female for annual exam postmenopausal osteopenia on drug holiday has schedule bone density study later today. She is taking her calcium and vitamin D supplement daily. Pap smear no longer indicated.   Terrance Mass MD, 11:35 AM 08/28/2016

## 2016-08-31 DIAGNOSIS — J3089 Other allergic rhinitis: Secondary | ICD-10-CM | POA: Diagnosis not present

## 2016-08-31 DIAGNOSIS — J301 Allergic rhinitis due to pollen: Secondary | ICD-10-CM | POA: Diagnosis not present

## 2016-09-04 DIAGNOSIS — J3089 Other allergic rhinitis: Secondary | ICD-10-CM | POA: Diagnosis not present

## 2016-09-04 DIAGNOSIS — J301 Allergic rhinitis due to pollen: Secondary | ICD-10-CM | POA: Diagnosis not present

## 2016-09-05 DIAGNOSIS — J3089 Other allergic rhinitis: Secondary | ICD-10-CM | POA: Diagnosis not present

## 2016-09-05 DIAGNOSIS — J301 Allergic rhinitis due to pollen: Secondary | ICD-10-CM | POA: Diagnosis not present

## 2016-09-07 DIAGNOSIS — J301 Allergic rhinitis due to pollen: Secondary | ICD-10-CM | POA: Diagnosis not present

## 2016-09-07 DIAGNOSIS — J3089 Other allergic rhinitis: Secondary | ICD-10-CM | POA: Diagnosis not present

## 2016-09-11 ENCOUNTER — Ambulatory Visit (INDEPENDENT_AMBULATORY_CARE_PROVIDER_SITE_OTHER): Payer: Medicare Other | Admitting: Pulmonary Disease

## 2016-09-11 ENCOUNTER — Other Ambulatory Visit (INDEPENDENT_AMBULATORY_CARE_PROVIDER_SITE_OTHER): Payer: Medicare Other

## 2016-09-11 ENCOUNTER — Encounter: Payer: Self-pay | Admitting: Pulmonary Disease

## 2016-09-11 VITALS — BP 118/72 | HR 73 | Temp 97.8°F | Ht 64.25 in | Wt 103.4 lb

## 2016-09-11 DIAGNOSIS — J4520 Mild intermittent asthma, uncomplicated: Secondary | ICD-10-CM | POA: Diagnosis not present

## 2016-09-11 DIAGNOSIS — F411 Generalized anxiety disorder: Secondary | ICD-10-CM | POA: Diagnosis not present

## 2016-09-11 DIAGNOSIS — I10 Essential (primary) hypertension: Secondary | ICD-10-CM

## 2016-09-11 DIAGNOSIS — K219 Gastro-esophageal reflux disease without esophagitis: Secondary | ICD-10-CM | POA: Diagnosis not present

## 2016-09-11 DIAGNOSIS — J301 Allergic rhinitis due to pollen: Secondary | ICD-10-CM

## 2016-09-11 LAB — BASIC METABOLIC PANEL
BUN: 7 mg/dL (ref 6–23)
CALCIUM: 9.8 mg/dL (ref 8.4–10.5)
CO2: 29 mEq/L (ref 19–32)
Chloride: 97 mEq/L (ref 96–112)
Creatinine, Ser: 0.69 mg/dL (ref 0.40–1.20)
GFR: 87.16 mL/min (ref >60.00)
GLUCOSE: 90 mg/dL (ref 70–99)
Potassium: 4 mEq/L (ref 3.5–5.1)
SODIUM: 132 meq/L — AB (ref 135–145)

## 2016-09-11 NOTE — Patient Instructions (Signed)
Today we updated your med list in our EPIC system...    Continue your current medications the same...  Today we rechecked your Metabolic panel to look at your sodium level...    We will contact you w/ the results when available...   Call for any questions or if we can be of service in any way.Marland KitchenMarland Kitchen

## 2016-09-11 NOTE — Progress Notes (Signed)
Subjective:    Patient ID: [name] [name], female    DOB: 1936-01-08, 81 y.o.   MRN: [id number]  HPI 81 y/o WF here for a 6 month follow up visit... she has multiple medical problems including HBP;  CAD/ hx palpit;  Divertics/ IBS/ Polyps/ Angiodysplasia;  LBP & FM;  Osteoporosis;  Anxiety...  ~  January 23, 2012:  54mo ROV & [name] was Dx w/ TMJ several weeks ago & has new mouth guard- we discussed assoc w/ anxiety and she has Xanax & Desyrel...     She saw DrVanWinkle for an allergy f/u 6/13 & she was doing satis on allergy shots, Zyrtek & saline, no changes made...    She saw DrHochrein 6/13 for Cards f/u doing satis & no changes made to her med regimen...    We reviewed prob list, meds, xrays and labs> see below >> she already had her 2013 Flu vaccine...  ~  July 23, 2012:  23mo ROV & [name] [name] relates a good bit of stress since she is the caregiver of her family... We reviewed the following medical problems during today's office visit >>     AR> on Zyrtek & allergy shots per DrVanWinkle- seen 6/13 & stable...    HBP> on Amlodipine2.5mg  & BP= 128/68; denies HA, CP, palpit, SOB, edema, etc...    CAD & Palpit> on ASA81 & FishOil; notes some incr palpit w/ stress but [name] helps; knows to avoid caffeine etc; she saw DrHochrein 6/12> EKG showed NSR, rate84, septal infarct (poor R prog V1-2, otherw neg, NAD; doing satis w/o new symptoms, no changes made...    Divertics & Colon Polyps> on Librax; followed by DrDBrodie w/ hx IBS and f/u colon due in 2013=> we will refer...    Hx angiodysplasia> as noted, she denies any recent bleeding and Hg has been stable...    Gyn- DrFernandez> on Estradiol 3.54% one applic 2x/wk for dryness etc...    Hx LBP/ FM> on Flexeril prn and she copes very well...    Osteopenia> on Calcium, MVI, & still on Fosamax drug holiday (since 3/11 per DrGottsegen) & repeat BMD 3/12 showed TScore -1.8 in Spine (see below); we decided to leave her off the Fosamax until f/u BMD  3/14=> pending...    Anxiety> on Xanax Prn & Desyrel 100mg /d; she is under a lot of stress still w/ husb illness & daugh divorce... We reviewed prob list, meds, xrays and labs> see below for updates >>   LABS 3/14:  FLP- at goals on diet x TG=170;  Chems- wnl;  CBC- wnl;  TSH=1.74;  UA- essent neg...  BMD 3/14 showed TScore -2.0 in Lumbar spine (was -1.8 in 2012); Rec to pt to consider re-start of Bisphos therapy but she declines & prefers to stay on Drug Holiday + supplements/ exercise...  ~  January 27, 2013:  55mo ROV & [name] [name] is stable, she has mult minor somatic complaints (not sleeping well, superfic varicosities, etc) but overall stable...     BP controlled on Amlod2.5 & BP= 114/68 w/o CP, palpit, SOB, edema, etc; followed for Cards by DrHochrein on ASA81 & prn NTG (hasn't needed)...     GI followed by DrDBrodie w/ divertics, colon polyps and hx angiodysplasia; no bleeding episodes, uses Librax prn...    Hx LBP & FM- on Flex10 prn & doing satis w/ minor somatic complaints as noted...    Anxiety treated w/ Alpraz0.5 as needed & the Desyrel100 Qhs helps her rest...  We reviewed prob  list, meds, xrays and labs> see below for updates >> she had the 2014 Flu vaccine in Sept... Refills today as requested...   ~  July 28, 2013:  31mo ROV & [name] [name] notes that her allergies are acting up & she has some arthritis in left thumb- rec to take Zyrtek, Saline, and try hot soaks & Aleve prn; she is under a lot of stress at home w/ husb illness, not resting well, etc... We reviewed the following medical problems during today's office visit >>     AR> on Zyrtek & allergy shots per DrVanWinkle- seen 6/13 & stable...    HBP> on Amlodipine2.5mg  & BP= 120/82; denies HA, CP, palpit, SOB, edema, etc...    CAD & Palpit> on ASA81 & FishOil; notes some palpit w/ stress but [name] helps; knows to avoid caffeine etc; she saw DrHochrein 6/12> EKG showed NSR, rate84, septal infarct (poor R prog V1-2, otherw neg, NAD);  doing satis w/o new symptoms...     Divertics & Colon Polyps> on Librax; followed by DrDBrodie w/ hx IBS and last colon 2009 showed AVM in cecum (not bleeding) & 65mm hyperplastic polyp in sigmoid...    Hx angiodysplasia> as noted, she denies any recent bleeding and Hg has been stable...    Gyn- DrFernandez> on Estradiol 1.70% one applic 2x/wk for dryness etc; she was seen 3/15 & note reviewed; mammogram 1/15 was neg...    Hx LBP/ FM> on Flexeril prn and she copes very well...    Osteopenia> on Calcium, MVI, & still on Fosamax drug holiday (since 3/11 per DrGottsegen/Fernandez) & BMD 3/12 showed TScore -1.8 in Spine (see below); we decided to leave her off the Fosamax until f/u BMD 6/14 w/ Tscore -2.0 in Spine; Rec to restart Bisphos therapy but she decided to continue her drug holiday + calcium, MVI, Vit D, and wt bearing exercise.    Anxiety> on Xanax Prn & Desyrel 100mg /d; she is under a lot of stress still w/ husb illness & daugh divorce... We reviewed prob list, meds, xrays and labs> see below for updates >> refills written per request...  CXR 3/15 showed norm heart size, clear lungs, NAD...  LABS 3/15:  FLP- at goals on diet w/ great HDL;  Chems- wnl;  CBC- wnl;  TSH=1.27  ~  March 27, 2016:  2.5 year ROV & pulmonary follow up visit>  [name] [name] has established PCP- DrCrawford, Cards- DrHochrein, [place], Allergy- DrVanWinkle... [name] tells me she went to her daughter's house in [place] over Thanksgiving and c/o a bad allergy attack, sinus infection, HA/ dizzy, cough w/ clear sput & some chest discomfort; she was seen by PCP & allergist=> given Depo, an antibiotic (Cefdinir), and cough syrup; CXR clear... Later changed to Augmentin + Astelin/Flonase, & Pred=> finally improved;  They were concerned over the CXR report indicating emphysema & aortic atherosclerosis and called for this follow up appt...     AR> on Zyrtek & allergy shots per DrVanWinkle- seen 2/17 & his note is reviewed...     HBP> on Amlodipine2.5mg  & Lasix20 prn; BP= 124/80; denies HA, CP, palpit, SOB, edema, etc...    CAD & Palpit> on ASA81 & FishOil; notes some palpit w/ stress but [name] helps; knows to avoid caffeine etc; she saw DrHochrein 8/17> EKG showed NSR, rate86, septal infarct (poor R prog V1-2, otherw neg, NAD); doing satis w/o new symptoms...     Divertics & Colon Polyps> on Librax prn; followed by DrDBrodie w/ hx IBS and last colon 2009  showed AVM in cecum (not bleeding) & 73mm hyperplastic polyp in sigmoid...    Hx angiodysplasia> as noted, she denies any recent bleeding and Hg has been stable...    Gyn- DrFernandez> on Estradiol 2.44% one applic 2x/wk for dryness etc; she was seen 4/17 & note reviewed- he follows mammograms and pelvic exams...    Hx LBP/ FM> on Flexeril & Tylenol prn and she copes very well...    Osteopenia> on Calcium, MVI, & still on Fosamax drug holiday (since 3/11 per DrGottsegen/Fernandez) & BMD 4/16 showed TScore -1.8 in Spine (see below); which is stable off Fosamax; Rec to continue her drug holiday + calcium, MVI, Vit D, and wt bearing exercise.    Anxiety> on Xanax Prn & Desyrel 100mg /d; she is under a lot of stress still w/ husb illness & daugh divorce... EXAM shows Afeb, VSS, O2sat=95% on RA;  HEENT- meg, mallampati1;  Chest- clear w/o w/r/r;  Heart- RR w/o m/r/g;  Abd- thin, soft, nontender;  Ext- neg w/o c/c/e;  Neuro- intact...  FullPFTs 03/28/16>  FVC=2.28 (85%), FEV1=1.61 (81%), %1sec=71, mid-flows reduced at 65% predicted; after bronchodil FEV1=1.73 (7% incr);  TLC=4.31 (85%), RV=1.82 (76%), RV/TLC=42%;  DLCO=60% predicted & DL/VA=82%... This indicated mild airflow obstruction (small airways) and a mod decrease in DLCO...  IMP/PLAN>>  Phelicia is reassured about her breathing & I do not believe that she would benefit from inhalers at this time- we will continue to monitor her breathing... She is prone to AB exac as evidenced at how long it takes to get over a URI;  She is up  to date on all indicated vaccinations;  It is poss that GERD & LPR are playing a roll here & she is advised to take an OTC H2 blocker Bid & antireflux regimen w. elev HOB 6" and NPO after dinner in the eve...  ~  July 24, 2016:  73mo ROV & add-on appt requested for persistent URI symptoms and cough>  Eulamae notes that she started w/ severe cough about a wk ago, assoc w/ f/c/s, sl sore throat, chest congestion & the cough "just won't turn loose", small amt thick white sput, no dark color or blood; she denies SOB but has has some chest discomfort due to the cough;  We called in [place], & Hycodan w/ only min improvement so far;  We decided to check CXR, Labs, and adjust Rx using ZPak, Depo80, Medrol8mg  tabs in a slow taper, Mucinex 1200mg  bid + fluids, & adjust the Hycodan to her liking & tolerability (ie- 1-52ml prn cough)... I explained to Malyia how long some folks have been suffering w/ these exact symptoms recently...  EXAM shows Afeb, VSS, O2sat=95% on RA;  HEENT- meg, mallampati1;  Chest- clear w/o w/r/r;  Heart- RR w/o m/r/g;  Abd- thin, soft, nontender;  Ext- neg w/o c/c/e;  Neuro- intact...  CXR 07/24/16>  Norm heart size, clear lungs- NAD, sl hyperinflation noted...  LABS 07/24/16>  Chems- ok x Na=129, otherw wnl;  CBC- ok w/ Hg=15.7, WBC=6.5 w/ norm diff x 16% monos;  TSH= 1.06... IMP/PLAN>>  I believe that Keyoni had an ILI w/ AB exac=> we discussed rx w/ ZPak, Depo80, Medtol8mg  tapering sched (see AVS), + Mucinex 1200mg  Bid & fluids=> ok to use soups, gatorade etc for the time being until she's feeling better; she has the Hycodan for cough & rest, now she just needs to give it some time. She knows to call for any questions... Note: >50% of this 25 min  visit was spent w/ counseling & coordination of care.   ~  Sep 11, 2016:  6wk ROV & Chelcie is finally over her prev ILI w/ AB exac after rx w/ Levaquin=> ZPak, Pred Dosepak=> Medrol, and Hycodan + Mucinex... In the interval she has  seen her PCP- DrCrawford w/ BMD done (lowest Tscore = -1.9 in lumbar spine & she takes calcium, MVI, VitD & wt bearing exercise);  She has also had her 1st Shingrix vaccination (+local reaction);  She is requesting a BMet check today "to check my sodium" she says...  We reviewed the following medical problems during today's office visit >>     AR> on Zyrtek & allergy shots per DrVanWinkle- seen 2/18 & his note is reviewed...    HBP> on Amlodipine2.5mg  & Lasix20 prn; BP= 118/72; denies HA, CP, palpit, SOB, edema, etc...    CAD & Palpit> on ASA81 & FishOil; notes some palpit w/ stress but [name] helps; knows to avoid caffeine etc; she saw DrHochrein 8/17> EKG showed NSR, rate86, septal infarct (poor R prog V1-2, otherw neg, NAD); doing satis w/o new symptoms...     Divertics & Colon Polyps> on Librax prn; followed by DrDBrodie w/ hx IBS and last colon 2009 showed AVM in cecum (not bleeding) & 101mm hyperplastic polyp in sigmoid...    Hx angiodysplasia> as noted, she denies any recent bleeding and Hg has been stable...    Gyn- DrFernandez> on Estradiol 7.82% one applic 2x/wk for dryness etc; she was seen 4/18 & note reviewed- he follows mammograms and pelvic exams...    Hx LBP/ FM> on Flexeril & Tylenol prn and she copes very well...    Osteopenia> on Calcium, MVI, & still on Fosamax drug holiday (since 3/11 per DrGottsegen/Fernandez) & BMD 4/16 showed TScore -1.8 in Spine (see below); which is stable off Fosamax; Rec to continue her drug holiday + calcium, MVI, Vit D, and wt bearing exercise.    Anxiety> on Xanax Prn & Desyrel 100mg /d; she is under a lot of stress still w/ husb illness & daugh divorce... EXAM shows Afeb, VSS, O2sat=95% on RA;  HEENT- meg, mallampati1;  Chest- clear w/o w/r/r;  Heart- RR w/o m/r/g;  Abd- thin, soft, nontender;  Ext- neg w/o c/c/e;  Neuro- intact...  LABS 09/11/16>  BMet- ok x Na=132 (improved from 129)... IMP/PLAN>>  Ameliyah is improved from her AB episode;  She continues on  allergy shots from DrVanWinkle; PCP- DrCrawford, Cards- DrHochrein, GYN- DrFernandez...             Problem List:  Hx of SINUSITIS (ICD-473.9) - uses ZYRTEK OTC, Saline nasal wash, & FLONASE as needed... she notes that her allergies are bothering her and she saw DrESL for allergy re-eval 7/12> still on shots... ~  6/13:  Now followed by DrVan Winkle> allergic rhinitis on shots since 1983, plus Zyrtek Prn & Saline Prn...  Hx of PNEUMONIA (ICD-486) >> resolved ~  CXR 3/12 showed clear lungs, NAD...  HYPERTENSION (ICD-401.9) - controlled on NORVASC 2.5mg /d... ~  9/12:  BP=136/82 & denies HA, fatigue, visual changes, CP, palipit, syncope, dyspnea, edema, etc... ~  3/13:  BP= 130/72 & she remains largely asymptomatic... ~  9/13:  BP= 124/62 & she denies visual symptoms, HA, CP, palpit, dizziness, SOB, edema, etc... ~  3/14:  on Amlodipine2.5mg  & BP= 128/68; denies HA, CP, palpit, SOB, edema, etc... ~  9/14: BP controlled on Amlod2.5 & BP= 114/68 w/o CP, palpit, SOB, edema, etc. ~  3/15: on Amlodipine2.5mg  &  BP= 120/82; denies HA, CP, palpit, SOB, edema, etc...  CORONARY ARTERY DISEASE (ICD-414.00) & Hx of PALPITATIONS (ICD-785.1) - on ASA 81mg /d, and FISH OIL daily... hx coronary spasm w/ non-obstructive CAD on cath 1999 (20-30% diagonal branch LAD only)... sl anterobasal HK & EF=59%... she reports some incr palpit w/ stress- Alpraz 0.5mg  helps... ~  Baseline EKG showed NSR, septal infarct w/ poor R prog V1-2, otherw wnl/ NAD==> no change on 6/13 tracing. ~  She is followed by DrHochrein & seen yearly> OV 6/13 reviewed==> stable, no new symptoms, no changes made... ~  EKG 6/13 showed NSR, rate75, LAD, qs in V1-2, otherw neg... ~  She saw DrHochrein 6/14> HBP & palpit; noted mod anxiety, no CP or new palpit, syncope, etc; ?MVP, no murmur heard- stable & no changes made. ~  EKG 6/14 showed NSR, rate73, LAD, old septal infarct, NAD...   DIVERTICULOSIS OF COLON (ICD-562.10), IBS (ICD-564.1), &  COLONIC POLYPS (ICD-211.3) >> ~  colonoscopy 12/03 by DrDBrodie showing divertics only...  ~  eval 3/09 for hematochezia w/ Hg 13.5, Fe 103, repeat colon w/ 66mm polyp= hyperplastic + angiodysplasia... f/u planned 45yrs... ~  2/10: notes some increased abd cramping/ IBS- we will refill her LIBRAX for Prn use...  Hx of COLONIC ANGIODYSPLASIA (ICD-569.84) - as above; no recent bleeding noted & Hg stable... ~  Colonoscopy 3/09 w/ AVM in cecum, not bleeding...   BACK PAIN, LUMBAR (ICD-724.2) - S/P LLam L4-5 yrs ago... ortho eval 9/06 by DrApplington w/ MRI showing DDD... conservative rx...  FIBROMYALGIA (ICD-729.1) - resting OK w/ TRAZADONE 100mg Qhs & FLEXERIL 10mg  Tid Prn...  OSTEOPOROSIS (ICD-733.00) - prev on FOSAMAX w/ D (she insisted on this Rx), +Calcium, +MVI...  ~  BMD 1/08 was improved w/ TScores -1.5 to -2.1 on FOSAMAX therapy...  ~  7/09:  she wants to discuss stopping Fosamax in favor of Vit K which she read in "[employer]: Personal" newsletter- mult questions answered... we will check Vit D level (normal= 63) and I rec that she continue the Fosamax, calcium supplements, multivit therapy... ~  2/10:  f/u BMD showed TScores -1.3 in Spine, & -1.8 in left FemNeck = improved... ~  3/11:  DrGottsegen placed her on a Bisphos drug holiday... ~  3/12:  f/u BMD showed TScores -1.8 in Spine, & -1.5 in the left [facility] ~  3/13:  We reviewed prev results & decided to continue Calcium, MVI, Vit D, & hold Bisphos therapy until f/u BMD due 3/14... ~  3/14:  F/u BMD shows TScore -2.0 in Spine and -1.6 in left FemNeck; Rec to restart Bisphos therapy but she decided to continue her drug holiday + calcium, MVI, Vit D, and wt bearing exercise...  ANXIETY (ICD-300.00) - uses ALPRAZOLAM 0.5mg  Tid Prn...  HEALTH MAINTENANCE:  she notes brother had shingles and we discussed the shingles vaccine- I rec that she get this shot at the health dept (NOTE: she reported mild case of Shingles on her left arm treated by  [facility] in 2011)... she had PNEUMOVAX in 2007... gets yearly FLU SHOTs... GYN = DrGottsegen/ Toney Rakes and he has her on Premarin Rx + vag cream; Neg Mamm[facility] 12/13 at [facility]...   Past Surgical History:  Procedure Laterality Date  . CHOLECYSTECTOMY  03/2003  . LUMBAR LAMINECTOMY    . TONSILLECTOMY     as a child    Outpatient Encounter Prescriptions as of 09/11/2016  Medication Sig  . ALPRAZolam (XANAX) 0.5 MG tablet TAKE 1/2 TO 1 TABLET THREE TIMES  DAILY AS NEEDED.  Marland Kitchen amLODipine (NORVASC) 2.5 MG tablet Take 1 tablet (2.5 mg total) by mouth daily.  Marland Kitchen aspirin 81 MG tablet Take 81 mg by mouth daily.    Marland Kitchen BIOTIN 5000 PO Take by mouth.  . calcium carbonate (CALCIUM 600) 600 MG TABS Take 600 mg by mouth daily.   . cetirizine (ZYRTEC) 10 MG tablet Take 10 mg by mouth daily.    . Cholecalciferol (VITAMIN D) 2000 units tablet Take 2,000 Units by mouth daily.  . cyclobenzaprine (FLEXERIL) 10 MG tablet Take 1 tablet (10 mg total) by mouth 3 (three) times daily as needed.  Marland Kitchen EPINEPHrine (EPI-PEN) 0.3 mg/0.3 mL DEVI Inject 0.3 mg into the muscle as needed.  . Estradiol POWD APPLY 1ML VAGINALLY TWICE WEEKLY.  . fish oil-omega-3 fatty acids 1000 MG capsule Take 1 g by mouth daily.    . furosemide (LASIX) 20 MG tablet Take 1 tablet (20 mg total) by mouth daily as needed.  . Multiple Vitamins-Minerals (PROTEGRA PO) Take by mouth daily.    . multivitamin (THERAGRAN) per tablet Take 1 tablet by mouth daily.    . nitroGLYCERIN (NITROSTAT) 0.4 MG SL tablet Place 1 tablet (0.4 mg total) under the tongue every 5 (five) minutes as needed. May repeat 3 times  . NONFORMULARY OR COMPOUNDED ITEM Estradiol .02% 1 ML Prefilled Applicator Sig: apply vaginally twice a week #90 Day Supply with 4 refills  . traZODone (DESYREL) 100 MG tablet TAKE ONE TABLET AT BEDTIME.  Marland Kitchen UNABLE TO FIND Allergy shots weekly   No facility-administered encounter medications on file as of 09/11/2016.     Allergies  Allergen  Reactions  . Codeine     REACTION: NAUSEA  . Erythromycin     REACTION: NAUSEA  . Other     Environmental allergies    Immunization History  Administered Date(s) Administered  . Influenza Split 01/30/2011  . Influenza Whole 01/29/2009, 01/29/2010, 01/10/2012, 02/09/2014  . Influenza,inj,Quad PF,36+ Mos 01/24/2013, 01/31/2016  . Influenza-Unspecified 01/26/2015  . Pneumococcal Conjugate-13 04/17/2014  . Pneumococcal Polysaccharide-23 05/02/2003  . Td 07/19/2009  . Zoster 07/31/2012  . Zoster Recombinat (Shingrix) 08/16/2016    Current Medications, Allergies, Past Medical History, Past Surgical History, Family History, and Social History were reviewed in [employer] record.    Review of Systems  Constitutional:  Denies F/C/S, anorexia, unexpected weight change. HEENT:  No HA, visual changes, earache, nasal symptoms, sore throat, hoarseness. Resp:  cough, sputum, no hemoptysis; no SOB, +tightness, +wheezing. Cardio:  No CP, DOE, orthopnea, edema. Notes occas palpit... GI:  Denies N/V/D/C or blood in stool; no reflux, abd pain, distention, or gas. GU:  No dysuria, freq, urgency, hematuria, or flank pain. MS:  Denies joint pain, swelling, tenderness, or decr ROM; no neck pain, back pain, etc. Neuro:  No tremors, seizures, dizziness, syncope, weakness, numbness, gait abn. Skin:  No suspicious lesions; she notes skin rash eval DrHouston. Heme:  No adenopathy, bruising, bleeding. Psyche: Denies confusion, sleep disturbance, hallucinations, anxiety, depression.     Objective:   Physical Exam   WD, WN, 81 y/o WF in NAD... Vital Signs:  Reviewed...   General:  Alert & oriented; pleasant & cooperative... HEENT:  [place]/AT, EOM-wnl, PERRLA, Fundi-benign, EACs-clear, TMs-wnl, NOSE-clear, THROAT-clear & wnl.  Neck:  Supple w/ fair ROM; no JVD; normal carotid impulses w/o bruits; no thyromegaly or nodules palpated; no lymphadenopathy.  Chest:  Congested cough,  without wheezes/ rales/ or rhonchi heard... Heart:  Regular Rhythm; norm S1 &  S2 without murmurs/ rubs/ or gallops detected... Abdomen:  Soft & nontender; normal bowel sounds; no organomegaly or masses palpated... Ext:  Normal ROM; without deformities or arthritic changes; no varicose veins, venous insuffic, or edema;  Pulses intact w/o bruits... Neuro:  CNs II-XII intact; motor testing normal; sensory testing normal; gait normal & balance OK... Derm:  No lesions noted; no rash etc... Lymph:  No cervical, supraclavicular, axillary, or inguinal adenopathy palpated...  RADIOLOGY DATA:  Reviewed in the EPIC EMR & discussed w/ the patient...  LABORATORY DATA:  Reviewed in the EPIC EMR & discussed w/ the patient...   Assessment & Plan:    Asthmatic Bronchitis>  She's had a hard time getting over this URI, persistent airway inflamm, congestion, cough, etc => finally better after 2 rounds of antibiotics, Pred, cough syrup, etc... ~  She also has some reflux  LPR>  rx w/ H2 blocker OTC Bid + antireflux regimen w/ NPO after dinner, & elev HOB etc... ~  3/18:  She's had another URI-ILI w/ refractory cough, chest congestion, etc=> we decided to Rx w/ ZPak, Depo, Medrol taper (see AVS), Mucinex, fluids, Hycodan, etc...   HBP> on Amlodipine2.5mg  & BP well controlled & stable; continue same...     CAD & Palpit> on ASA & FishOil; followed by DrHochrein> doing satis w/o new symptoms, no changes made...     Divertics & Colon Polyps> on Librax; followed by DrDBrodie w/ hx IBS and stable...     Hx Angiodysplasia> AVM in cecum 3/09 colon; she denies any recent bleeding and Hg has been stable...     Hx LBP/ FM> on Flexeril prn and she copes very well...     Osteopenia> on Calcium, MVI, & Fosamax drug holiday since 3/11 per DrGottsegen & repeat BMD 3/12 showed TScore -1.8 in Spine and 3/14 showed TScore -2.0; she declines restart Bisphos & prefers to continue drug holiday...     Anxiety> on Xanax Prn &  Desyrel 100mg /d; she is under a lot of stress still...   Patient's Medications  New Prescriptions   No medications on file  Previous Medications   ALPRAZOLAM (XANAX) 0.5 MG TABLET    TAKE 1/2 TO 1 TABLET THREE TIMES DAILY AS NEEDED.   AMLODIPINE (NORVASC) 2.5 MG TABLET    Take 1 tablet (2.5 mg total) by mouth daily.   ASPIRIN 81 MG TABLET    Take 81 mg by mouth daily.     BIOTIN 5000 PO    Take by mouth.   CALCIUM CARBONATE (CALCIUM 600) 600 MG TABS    Take 600 mg by mouth daily.    CETIRIZINE (ZYRTEC) 10 MG TABLET    Take 10 mg by mouth daily.     CHOLECALCIFEROL (VITAMIN D) 2000 UNITS TABLET    Take 2,000 Units by mouth daily.   CYCLOBENZAPRINE (FLEXERIL) 10 MG TABLET    Take 1 tablet (10 mg total) by mouth 3 (three) times daily as needed.   EPINEPHRINE (EPI-PEN) 0.3 MG/0.3 ML DEVI    Inject 0.3 mg into the muscle as needed.   ESTRADIOL POWD    APPLY 1ML VAGINALLY TWICE WEEKLY.   FISH OIL-OMEGA-3 FATTY ACIDS 1000 MG CAPSULE    Take 1 g by mouth daily.     FUROSEMIDE (LASIX) 20 MG TABLET    Take 1 tablet (20 mg total) by mouth daily as needed.   MULTIPLE VITAMINS-MINERALS (PROTEGRA PO)    Take by mouth daily.     MULTIVITAMIN (THERAGRAN) PER TABLET  Take 1 tablet by mouth daily.     NITROGLYCERIN (NITROSTAT) 0.4 MG SL TABLET    Place 1 tablet (0.4 mg total) under the tongue every 5 (five) minutes as needed. May repeat 3 times   NONFORMULARY OR COMPOUNDED ITEM    Estradiol .02% 1 ML Prefilled Applicator Sig: apply vaginally twice a week #90 Day Supply with 4 refills   TRAZODONE (DESYREL) 100 MG TABLET    TAKE ONE TABLET AT BEDTIME.   UNABLE TO FIND    Allergy shots weekly  Modified Medications   No medications on file  Discontinued Medications   No medications on file

## 2016-09-13 ENCOUNTER — Encounter: Payer: Self-pay | Admitting: Gynecology

## 2016-09-14 ENCOUNTER — Telehealth: Payer: Self-pay | Admitting: Pulmonary Disease

## 2016-09-14 DIAGNOSIS — J3089 Other allergic rhinitis: Secondary | ICD-10-CM | POA: Diagnosis not present

## 2016-09-14 DIAGNOSIS — J301 Allergic rhinitis due to pollen: Secondary | ICD-10-CM | POA: Diagnosis not present

## 2016-09-14 IMAGING — CR DG CHEST 2V
2 series · 2 of 2 positions shown · non-contrast
Comparison: 07/28/2013

CLINICAL DATA: Initial evaluation for chest pain and cough with
shortness of breath for 2 weeks

EXAM:
CHEST  2 VIEW

[view not recorded (1 of 2)]
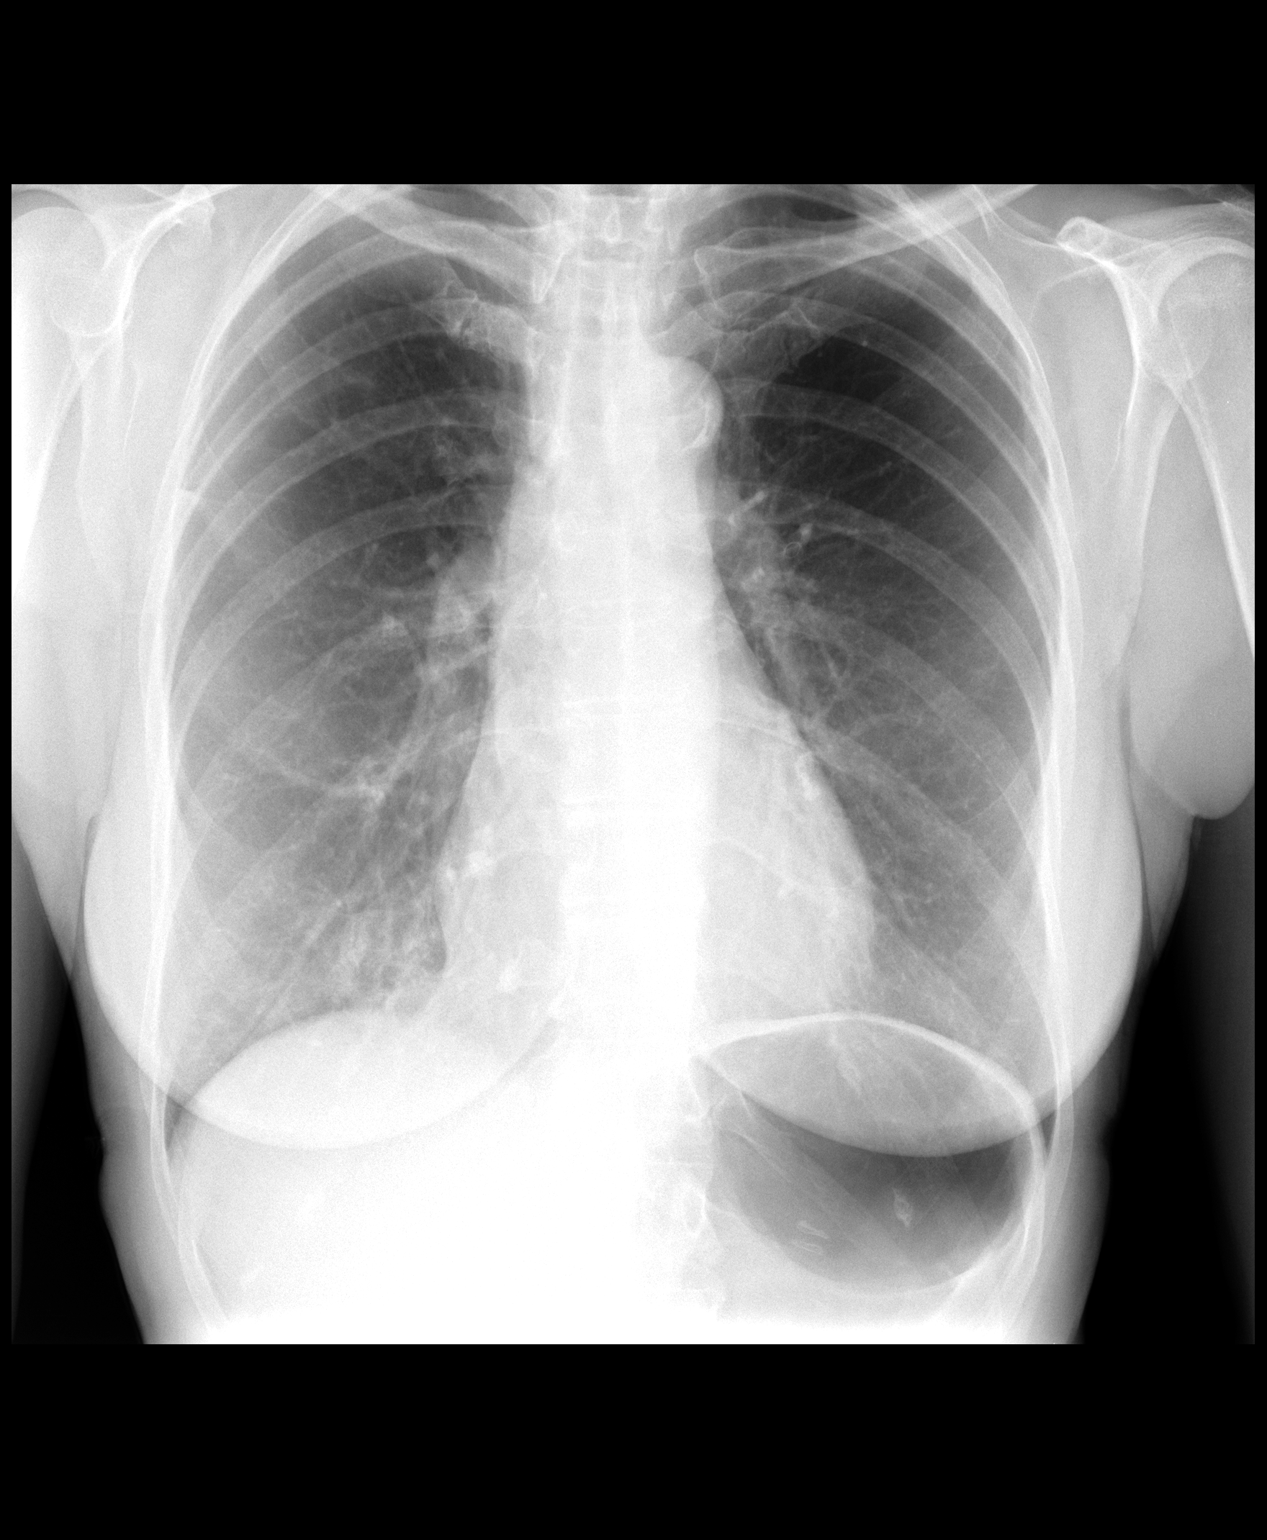

[view not recorded (2 of 2)]
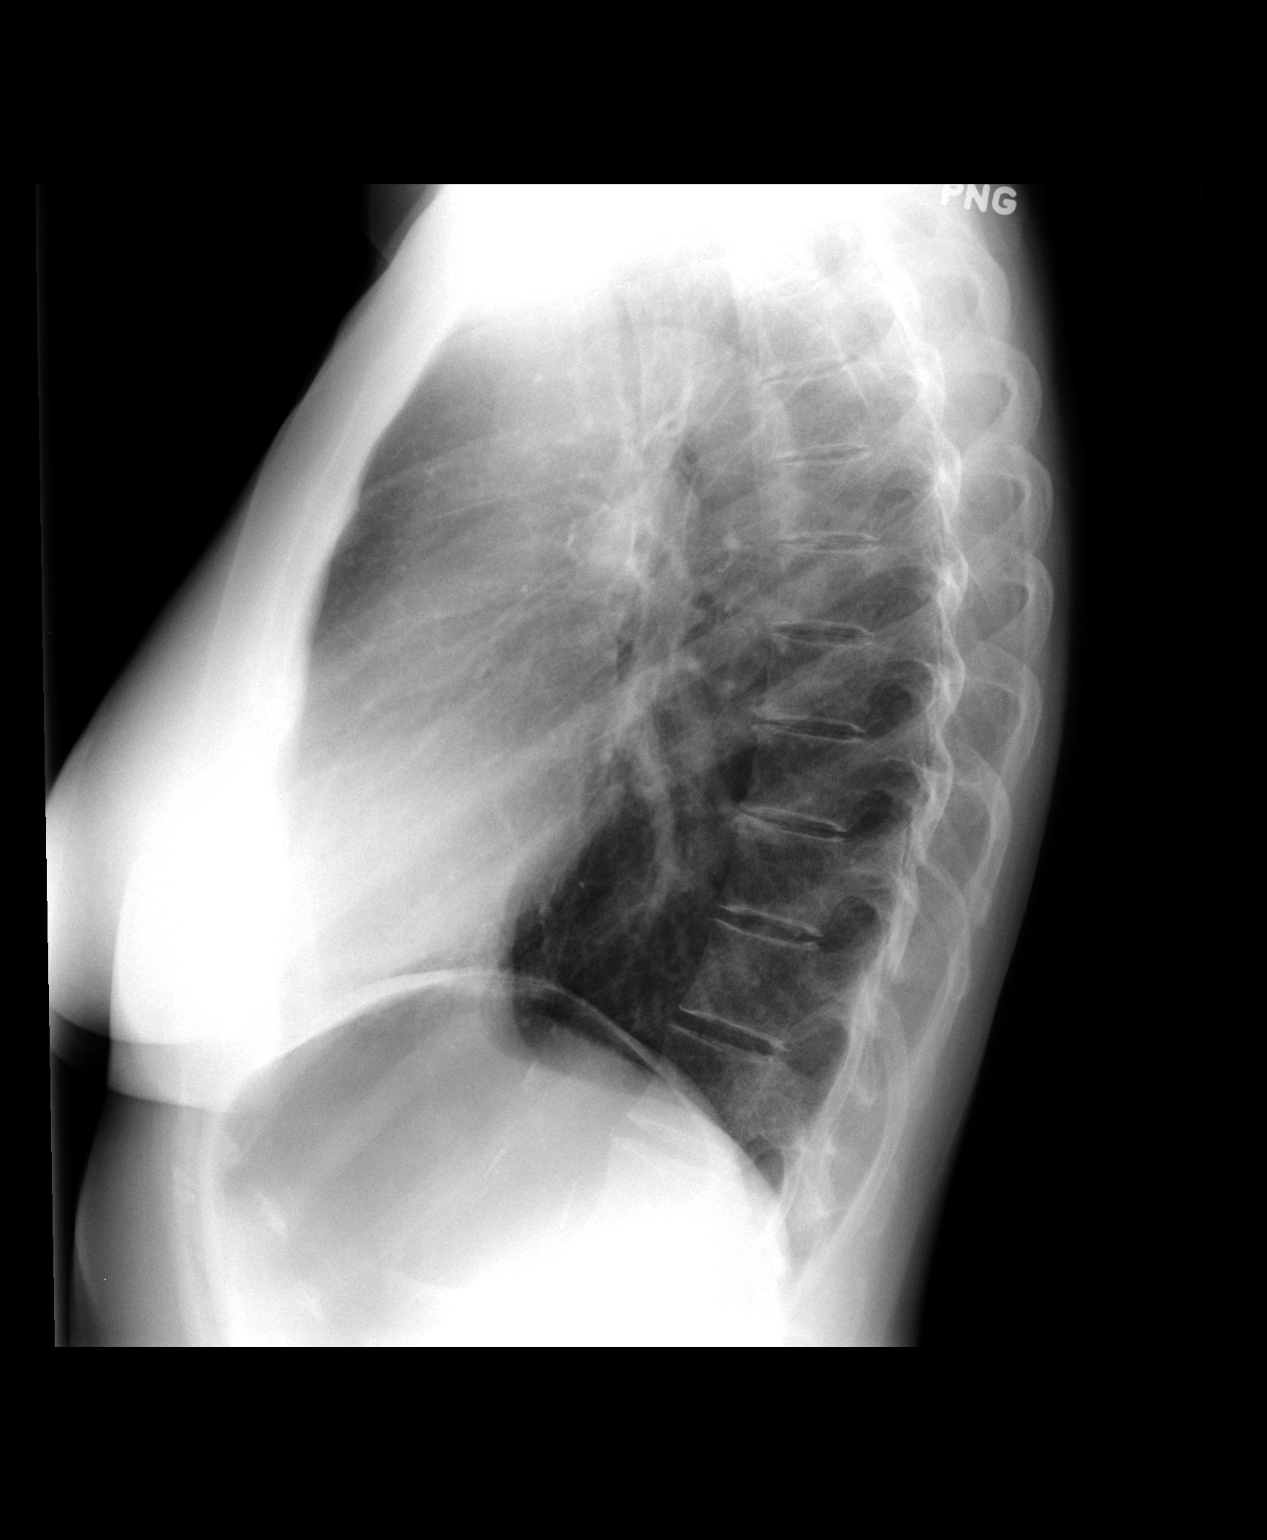

[2 of 2 positions shown; findings below may reference images not displayed]

FINDINGS: Heart size and vascular pattern are normal.  Left lung is clear.

There is new opacity medially in the right lung base. No pleural
effusion.
IMPRESSION: Infiltrate medial right lower lobe consistent with pneumonia.
Suggest radiographic follow-up to ensure resolution.

## 2016-09-14 NOTE — Telephone Encounter (Signed)
Called and spoke with pt and she is aware of results per SN. Nothing further is needed.  

## 2016-09-20 DIAGNOSIS — J3089 Other allergic rhinitis: Secondary | ICD-10-CM | POA: Diagnosis not present

## 2016-09-20 DIAGNOSIS — J301 Allergic rhinitis due to pollen: Secondary | ICD-10-CM | POA: Diagnosis not present

## 2016-09-28 DIAGNOSIS — J301 Allergic rhinitis due to pollen: Secondary | ICD-10-CM | POA: Diagnosis not present

## 2016-09-28 DIAGNOSIS — J3089 Other allergic rhinitis: Secondary | ICD-10-CM | POA: Diagnosis not present

## 2016-10-05 DIAGNOSIS — J3089 Other allergic rhinitis: Secondary | ICD-10-CM | POA: Diagnosis not present

## 2016-10-05 DIAGNOSIS — J301 Allergic rhinitis due to pollen: Secondary | ICD-10-CM | POA: Diagnosis not present

## 2016-10-12 DIAGNOSIS — J3089 Other allergic rhinitis: Secondary | ICD-10-CM | POA: Diagnosis not present

## 2016-10-12 DIAGNOSIS — J301 Allergic rhinitis due to pollen: Secondary | ICD-10-CM | POA: Diagnosis not present

## 2016-10-18 DIAGNOSIS — D225 Melanocytic nevi of trunk: Secondary | ICD-10-CM | POA: Diagnosis not present

## 2016-10-18 DIAGNOSIS — L820 Inflamed seborrheic keratosis: Secondary | ICD-10-CM | POA: Diagnosis not present

## 2016-10-18 DIAGNOSIS — L570 Actinic keratosis: Secondary | ICD-10-CM | POA: Diagnosis not present

## 2016-10-18 DIAGNOSIS — L814 Other melanin hyperpigmentation: Secondary | ICD-10-CM | POA: Diagnosis not present

## 2016-10-18 DIAGNOSIS — L821 Other seborrheic keratosis: Secondary | ICD-10-CM | POA: Diagnosis not present

## 2016-10-18 DIAGNOSIS — D485 Neoplasm of uncertain behavior of skin: Secondary | ICD-10-CM | POA: Diagnosis not present

## 2016-10-19 DIAGNOSIS — J3089 Other allergic rhinitis: Secondary | ICD-10-CM | POA: Diagnosis not present

## 2016-10-19 DIAGNOSIS — J301 Allergic rhinitis due to pollen: Secondary | ICD-10-CM | POA: Diagnosis not present

## 2016-10-25 ENCOUNTER — Other Ambulatory Visit: Payer: Self-pay | Admitting: Internal Medicine

## 2016-10-25 NOTE — Telephone Encounter (Signed)
Pt called in ands said that she is leaving town Friday and she needs her refill for the xanex asap!

## 2016-10-25 NOTE — Telephone Encounter (Signed)
Please advise, Last Dispensed 09/26/2016 with 70 tabs

## 2016-10-26 DIAGNOSIS — J3089 Other allergic rhinitis: Secondary | ICD-10-CM | POA: Diagnosis not present

## 2016-10-26 DIAGNOSIS — J301 Allergic rhinitis due to pollen: Secondary | ICD-10-CM | POA: Diagnosis not present

## 2016-11-09 DIAGNOSIS — J3089 Other allergic rhinitis: Secondary | ICD-10-CM | POA: Diagnosis not present

## 2016-11-09 DIAGNOSIS — J301 Allergic rhinitis due to pollen: Secondary | ICD-10-CM | POA: Diagnosis not present

## 2016-11-16 DIAGNOSIS — J3089 Other allergic rhinitis: Secondary | ICD-10-CM | POA: Diagnosis not present

## 2016-11-16 DIAGNOSIS — J301 Allergic rhinitis due to pollen: Secondary | ICD-10-CM | POA: Diagnosis not present

## 2016-11-23 DIAGNOSIS — J301 Allergic rhinitis due to pollen: Secondary | ICD-10-CM | POA: Diagnosis not present

## 2016-11-23 DIAGNOSIS — J3089 Other allergic rhinitis: Secondary | ICD-10-CM | POA: Diagnosis not present

## 2016-11-26 ENCOUNTER — Other Ambulatory Visit: Payer: Self-pay | Admitting: Internal Medicine

## 2016-11-27 NOTE — Telephone Encounter (Signed)
Last dispensed 10/25/2016 with 70 tabs for 24 days

## 2016-11-28 ENCOUNTER — Other Ambulatory Visit: Payer: Self-pay | Admitting: Internal Medicine

## 2016-11-29 DIAGNOSIS — D692 Other nonthrombocytopenic purpura: Secondary | ICD-10-CM | POA: Diagnosis not present

## 2016-11-29 DIAGNOSIS — L821 Other seborrheic keratosis: Secondary | ICD-10-CM | POA: Diagnosis not present

## 2016-11-29 DIAGNOSIS — L298 Other pruritus: Secondary | ICD-10-CM | POA: Diagnosis not present

## 2016-11-29 DIAGNOSIS — L438 Other lichen planus: Secondary | ICD-10-CM | POA: Diagnosis not present

## 2016-11-29 DIAGNOSIS — L814 Other melanin hyperpigmentation: Secondary | ICD-10-CM | POA: Diagnosis not present

## 2016-12-07 DIAGNOSIS — J301 Allergic rhinitis due to pollen: Secondary | ICD-10-CM | POA: Diagnosis not present

## 2016-12-07 DIAGNOSIS — J3089 Other allergic rhinitis: Secondary | ICD-10-CM | POA: Diagnosis not present

## 2016-12-13 DIAGNOSIS — J301 Allergic rhinitis due to pollen: Secondary | ICD-10-CM | POA: Diagnosis not present

## 2016-12-13 DIAGNOSIS — J3089 Other allergic rhinitis: Secondary | ICD-10-CM | POA: Diagnosis not present

## 2016-12-20 DIAGNOSIS — J301 Allergic rhinitis due to pollen: Secondary | ICD-10-CM | POA: Diagnosis not present

## 2016-12-20 DIAGNOSIS — J3089 Other allergic rhinitis: Secondary | ICD-10-CM | POA: Diagnosis not present

## 2016-12-20 NOTE — Progress Notes (Signed)
HPI The patient presents for followup of hypertension and palpitations.  At a recent visit she had dyspnea in 2016.  However, POET (Plain Old Exercise Treadmill) was negative for evidence of ischemia.  She returns for follow up.  Since I last saw her she has done well.   Her husband died in Feb 06, 2023.  She is appropriately grieving.  She is doing some activities and gets to the gym some weeks but not as much as I would suggest.    Allergies  Allergen Reactions  . Codeine     REACTION: NAUSEA  . Erythromycin     REACTION: NAUSEA  . Other     Environmental allergies    Current Outpatient Prescriptions  Medication Sig Dispense Refill  . ALPRAZolam (XANAX) 0.5 MG tablet TAKE 1/2 TO 1 TABLET THREE TIMES DAILY AS NEEDED. 70 tablet 0  . amLODipine (NORVASC) 2.5 MG tablet Take 1 tablet (2.5 mg total) by mouth daily. 90 tablet 3  . aspirin 81 MG tablet Take 81 mg by mouth daily.      Marland Kitchen BIOTIN 5000 PO Take by mouth.    . calcium carbonate (CALCIUM 600) 600 MG TABS Take 600 mg by mouth daily.     . cetirizine (ZYRTEC) 10 MG tablet Take 10 mg by mouth daily.      . Cholecalciferol (VITAMIN D) 2000 units tablet Take 2,000 Units by mouth daily.    . cyclobenzaprine (FLEXERIL) 10 MG tablet Take 1 tablet (10 mg total) by mouth 3 (three) times daily as needed. 30 tablet 1  . EPINEPHrine (EPI-PEN) 0.3 mg/0.3 mL DEVI Inject 0.3 mg into the muscle as needed.    . Estradiol POWD APPLY 1ML VAGINALLY TWICE WEEKLY. 24 g 3  . fish oil-omega-3 fatty acids 1000 MG capsule Take 1 g by mouth daily.      . folic acid (FOLVITE) 810 MCG tablet Take 800 mcg by mouth daily.    . furosemide (LASIX) 20 MG tablet Take 1 tablet (20 mg total) by mouth daily as needed. 30 tablet 3  . Multiple Vitamins-Minerals (PROTEGRA PO) Take by mouth daily.      . multivitamin (THERAGRAN) per tablet Take 1 tablet by mouth daily.      . nitroGLYCERIN (NITROSTAT) 0.4 MG SL tablet Place 1 tablet (0.4 mg total) under the tongue every 5  (five) minutes as needed. May repeat 3 times 25 tablet 2  . NONFORMULARY OR COMPOUNDED ITEM Estradiol 0.02% cream 1/4 ml twice weekly 24 each 2  . traZODone (DESYREL) 100 MG tablet TAKE ONE TABLET AT BEDTIME. 90 tablet 3  . TURMERIC PO Take 1,500 mg by mouth daily.    Marland Kitchen UNABLE TO FIND Allergy shots weekly     No current facility-administered medications for this visit.     Past Medical History:  Diagnosis Date  . Anxiety   . Coronary artery spasm (HCC)    Non obstructive CAD  . Diverticulosis of colon   . Fibromyalgia   . Hx of colonic polyps   . Lumbar back pain   . MVP (mitral valve prolapse)    No antibiotics required for procedures  . Osteoporosis   . Palpitations   . Pneumonia   . Shingles     Past Surgical History:  Procedure Laterality Date  . CHOLECYSTECTOMY  03/2003  . LUMBAR LAMINECTOMY    . TONSILLECTOMY     as a child    ROS:  As stated in the HPI and  negative for all other systems.  PHYSICAL EXAM BP 122/76 (BP Location: Left Arm, Patient Position: Sitting, Cuff Size: Normal)   Pulse 84   Ht 5' 4.25" (1.632 m)   Wt 107 lb (48.5 kg)   BMI 18.22 kg/m   GENERAL:  Well appearing NECK:  No jugular venous distention, waveform within normal limits, carotid upstroke brisk and symmetric, no bruits, no thyromegaly LUNGS:  Clear to auscultation bilaterally BACK:  No CVA tenderness CHEST:  Unremarkable HEART:  PMI not displaced or sustained,S1 and S2 within normal limits, no S3, no S4, no clicks, no rubs, no murmurs ABD:  Flat, positive bowel sounds normal in frequency in pitch, no bruits, no rebound, no guarding, no midline pulsatile mass, no hepatomegaly, no splenomegaly EXT:  2 plus pulses throughout, no edema, no cyanosis no clubbing   EKG: Normal sinus rhythm, rate 84, left axis, LAFB, poor anterior R-wave progression, no acute ST-T wave changes. No change from previous.  12/22/2016  ASSESSMENT AND PLAN  PALPITATIONS:  These are rare and not particularly  symptomatic.  No change in therapy.    The patient has stable pattern of palpitations. She will continue on the meds as listed.  MVP:  No change in exam or symptoms.  No further imaging.   SOB:  This is actually improved.  No change in therapy is indicated.   She is going to start exercising more.

## 2016-12-21 ENCOUNTER — Other Ambulatory Visit: Payer: Self-pay | Admitting: *Deleted

## 2016-12-21 ENCOUNTER — Encounter: Payer: Self-pay | Admitting: Cardiology

## 2016-12-21 ENCOUNTER — Ambulatory Visit (INDEPENDENT_AMBULATORY_CARE_PROVIDER_SITE_OTHER): Payer: Medicare Other | Admitting: Cardiology

## 2016-12-21 VITALS — BP 122/76 | HR 84 | Ht 64.25 in | Wt 107.0 lb

## 2016-12-21 DIAGNOSIS — I341 Nonrheumatic mitral (valve) prolapse: Secondary | ICD-10-CM

## 2016-12-21 DIAGNOSIS — R002 Palpitations: Secondary | ICD-10-CM

## 2016-12-21 MED ORDER — NONFORMULARY OR COMPOUNDED ITEM: 2 refills | Status: DC

## 2016-12-21 MED ORDER — NITROGLYCERIN 0.4 MG SL SUBL
0.4000 mg | SUBLINGUAL_TABLET | SUBLINGUAL | 2 refills | Status: DC | PRN
Start: 2016-12-21 — End: 2019-01-28

## 2016-12-21 NOTE — Telephone Encounter (Signed)
Patient will see you next annual exam, Dr.Fernandez didn't mention Rx in annual note on 08/28/16. However in 2018 note he did note "vaginal estrogen twice a week for vaginal atrophy "  Okay to refill x 1 year?

## 2016-12-21 NOTE — Telephone Encounter (Signed)
Pt aware, Rx called in

## 2016-12-21 NOTE — Telephone Encounter (Signed)
Yes, but recommend 1/4 of an applicator twice a week.

## 2016-12-21 NOTE — Patient Instructions (Signed)
Medication Instructions:  Continue current medications  If you need a refill on your cardiac medications before your next appointment, please call your pharmacy.  Labwork: None Ordered   Testing/Procedures: None Ordered  Follow-Up: Your physician wants you to follow-up in: 1 Year. You should receive a reminder letter in the mail two months in advance. If you do not receive a letter, please call our office [phone].    Thank you for choosing [facility]!!      

## 2016-12-22 ENCOUNTER — Encounter: Payer: Self-pay | Admitting: Cardiology

## 2016-12-28 DIAGNOSIS — J301 Allergic rhinitis due to pollen: Secondary | ICD-10-CM | POA: Diagnosis not present

## 2016-12-28 DIAGNOSIS — J3089 Other allergic rhinitis: Secondary | ICD-10-CM | POA: Diagnosis not present

## 2017-01-02 ENCOUNTER — Other Ambulatory Visit: Payer: Self-pay | Admitting: Family

## 2017-01-02 NOTE — Telephone Encounter (Signed)
Last refill was 11/29/16 per [facility]

## 2017-01-02 NOTE — Telephone Encounter (Signed)
Rx faxed

## 2017-01-03 DIAGNOSIS — H401331 Pigmentary glaucoma, bilateral, mild stage: Secondary | ICD-10-CM | POA: Diagnosis not present

## 2017-01-04 DIAGNOSIS — J301 Allergic rhinitis due to pollen: Secondary | ICD-10-CM | POA: Diagnosis not present

## 2017-01-04 DIAGNOSIS — J3089 Other allergic rhinitis: Secondary | ICD-10-CM | POA: Diagnosis not present

## 2017-01-11 DIAGNOSIS — J3089 Other allergic rhinitis: Secondary | ICD-10-CM | POA: Diagnosis not present

## 2017-01-11 DIAGNOSIS — J301 Allergic rhinitis due to pollen: Secondary | ICD-10-CM | POA: Diagnosis not present

## 2017-01-18 DIAGNOSIS — J301 Allergic rhinitis due to pollen: Secondary | ICD-10-CM | POA: Diagnosis not present

## 2017-01-18 DIAGNOSIS — J3089 Other allergic rhinitis: Secondary | ICD-10-CM | POA: Diagnosis not present

## 2017-01-25 DIAGNOSIS — J301 Allergic rhinitis due to pollen: Secondary | ICD-10-CM | POA: Diagnosis not present

## 2017-01-25 DIAGNOSIS — J3089 Other allergic rhinitis: Secondary | ICD-10-CM | POA: Diagnosis not present

## 2017-02-01 DIAGNOSIS — J3089 Other allergic rhinitis: Secondary | ICD-10-CM | POA: Diagnosis not present

## 2017-02-01 DIAGNOSIS — J301 Allergic rhinitis due to pollen: Secondary | ICD-10-CM | POA: Diagnosis not present

## 2017-02-02 ENCOUNTER — Other Ambulatory Visit: Payer: Self-pay

## 2017-02-02 MED ORDER — ALPRAZOLAM 0.5 MG PO TABS: ORAL_TABLET | ORAL | 3 refills | Status: DC

## 2017-02-02 NOTE — Progress Notes (Signed)
Faxed to pharmacy

## 2017-02-15 DIAGNOSIS — J301 Allergic rhinitis due to pollen: Secondary | ICD-10-CM | POA: Diagnosis not present

## 2017-02-15 DIAGNOSIS — J3089 Other allergic rhinitis: Secondary | ICD-10-CM | POA: Diagnosis not present

## 2017-02-22 DIAGNOSIS — J301 Allergic rhinitis due to pollen: Secondary | ICD-10-CM | POA: Diagnosis not present

## 2017-02-22 DIAGNOSIS — J3089 Other allergic rhinitis: Secondary | ICD-10-CM | POA: Diagnosis not present

## 2017-03-08 DIAGNOSIS — J301 Allergic rhinitis due to pollen: Secondary | ICD-10-CM | POA: Diagnosis not present

## 2017-03-08 DIAGNOSIS — J3089 Other allergic rhinitis: Secondary | ICD-10-CM | POA: Diagnosis not present

## 2017-03-15 DIAGNOSIS — J301 Allergic rhinitis due to pollen: Secondary | ICD-10-CM | POA: Diagnosis not present

## 2017-03-15 DIAGNOSIS — J3089 Other allergic rhinitis: Secondary | ICD-10-CM | POA: Diagnosis not present

## 2017-03-21 DIAGNOSIS — J301 Allergic rhinitis due to pollen: Secondary | ICD-10-CM | POA: Diagnosis not present

## 2017-03-21 DIAGNOSIS — J3089 Other allergic rhinitis: Secondary | ICD-10-CM | POA: Diagnosis not present

## 2017-03-29 DIAGNOSIS — J3089 Other allergic rhinitis: Secondary | ICD-10-CM | POA: Diagnosis not present

## 2017-03-29 DIAGNOSIS — J301 Allergic rhinitis due to pollen: Secondary | ICD-10-CM | POA: Diagnosis not present

## 2017-04-05 DIAGNOSIS — J301 Allergic rhinitis due to pollen: Secondary | ICD-10-CM | POA: Diagnosis not present

## 2017-04-05 DIAGNOSIS — J3089 Other allergic rhinitis: Secondary | ICD-10-CM | POA: Diagnosis not present

## 2017-04-12 DIAGNOSIS — J301 Allergic rhinitis due to pollen: Secondary | ICD-10-CM | POA: Diagnosis not present

## 2017-04-12 DIAGNOSIS — J3089 Other allergic rhinitis: Secondary | ICD-10-CM | POA: Diagnosis not present

## 2017-04-19 DIAGNOSIS — J301 Allergic rhinitis due to pollen: Secondary | ICD-10-CM | POA: Diagnosis not present

## 2017-04-19 DIAGNOSIS — J3089 Other allergic rhinitis: Secondary | ICD-10-CM | POA: Diagnosis not present

## 2017-05-03 DIAGNOSIS — J301 Allergic rhinitis due to pollen: Secondary | ICD-10-CM | POA: Diagnosis not present

## 2017-05-03 DIAGNOSIS — J3089 Other allergic rhinitis: Secondary | ICD-10-CM | POA: Diagnosis not present

## 2017-05-10 DIAGNOSIS — J301 Allergic rhinitis due to pollen: Secondary | ICD-10-CM | POA: Diagnosis not present

## 2017-05-10 DIAGNOSIS — J3081 Allergic rhinitis due to animal (cat) (dog) hair and dander: Secondary | ICD-10-CM | POA: Diagnosis not present

## 2017-05-10 DIAGNOSIS — J3089 Other allergic rhinitis: Secondary | ICD-10-CM | POA: Diagnosis not present

## 2017-05-17 DIAGNOSIS — J3081 Allergic rhinitis due to animal (cat) (dog) hair and dander: Secondary | ICD-10-CM | POA: Diagnosis not present

## 2017-05-17 DIAGNOSIS — J301 Allergic rhinitis due to pollen: Secondary | ICD-10-CM | POA: Diagnosis not present

## 2017-05-17 DIAGNOSIS — J3089 Other allergic rhinitis: Secondary | ICD-10-CM | POA: Diagnosis not present

## 2017-05-24 DIAGNOSIS — J301 Allergic rhinitis due to pollen: Secondary | ICD-10-CM | POA: Diagnosis not present

## 2017-05-24 DIAGNOSIS — J3089 Other allergic rhinitis: Secondary | ICD-10-CM | POA: Diagnosis not present

## 2017-05-31 DIAGNOSIS — J3089 Other allergic rhinitis: Secondary | ICD-10-CM | POA: Diagnosis not present

## 2017-05-31 DIAGNOSIS — J301 Allergic rhinitis due to pollen: Secondary | ICD-10-CM | POA: Diagnosis not present

## 2017-06-04 ENCOUNTER — Encounter: Payer: Self-pay | Admitting: Obstetrics & Gynecology

## 2017-06-04 DIAGNOSIS — L814 Other melanin hyperpigmentation: Secondary | ICD-10-CM | POA: Diagnosis not present

## 2017-06-04 DIAGNOSIS — Z1231 Encounter for screening mammogram for malignant neoplasm of breast: Secondary | ICD-10-CM | POA: Diagnosis not present

## 2017-06-04 DIAGNOSIS — L298 Other pruritus: Secondary | ICD-10-CM | POA: Diagnosis not present

## 2017-06-04 DIAGNOSIS — D225 Melanocytic nevi of trunk: Secondary | ICD-10-CM | POA: Diagnosis not present

## 2017-06-04 DIAGNOSIS — L821 Other seborrheic keratosis: Secondary | ICD-10-CM | POA: Diagnosis not present

## 2017-06-04 DIAGNOSIS — L820 Inflamed seborrheic keratosis: Secondary | ICD-10-CM | POA: Diagnosis not present

## 2017-06-04 DIAGNOSIS — L570 Actinic keratosis: Secondary | ICD-10-CM | POA: Diagnosis not present

## 2017-06-04 DIAGNOSIS — L218 Other seborrheic dermatitis: Secondary | ICD-10-CM | POA: Diagnosis not present

## 2017-06-07 DIAGNOSIS — J3089 Other allergic rhinitis: Secondary | ICD-10-CM | POA: Diagnosis not present

## 2017-06-07 DIAGNOSIS — J301 Allergic rhinitis due to pollen: Secondary | ICD-10-CM | POA: Diagnosis not present

## 2017-06-14 ENCOUNTER — Other Ambulatory Visit: Payer: Self-pay | Admitting: Internal Medicine

## 2017-06-14 DIAGNOSIS — J301 Allergic rhinitis due to pollen: Secondary | ICD-10-CM | POA: Diagnosis not present

## 2017-06-14 DIAGNOSIS — J3089 Other allergic rhinitis: Secondary | ICD-10-CM | POA: Diagnosis not present

## 2017-06-15 NOTE — Telephone Encounter (Signed)
Control database checked last refill: 05/11/2017 LOV: 08/14/2016

## 2017-06-18 DIAGNOSIS — J3089 Other allergic rhinitis: Secondary | ICD-10-CM | POA: Diagnosis not present

## 2017-06-18 DIAGNOSIS — H1045 Other chronic allergic conjunctivitis: Secondary | ICD-10-CM | POA: Diagnosis not present

## 2017-06-18 DIAGNOSIS — J3081 Allergic rhinitis due to animal (cat) (dog) hair and dander: Secondary | ICD-10-CM | POA: Diagnosis not present

## 2017-06-18 DIAGNOSIS — J301 Allergic rhinitis due to pollen: Secondary | ICD-10-CM | POA: Diagnosis not present

## 2017-06-21 DIAGNOSIS — J3089 Other allergic rhinitis: Secondary | ICD-10-CM | POA: Diagnosis not present

## 2017-06-21 DIAGNOSIS — J301 Allergic rhinitis due to pollen: Secondary | ICD-10-CM | POA: Diagnosis not present

## 2017-06-28 DIAGNOSIS — J3089 Other allergic rhinitis: Secondary | ICD-10-CM | POA: Diagnosis not present

## 2017-06-28 DIAGNOSIS — J301 Allergic rhinitis due to pollen: Secondary | ICD-10-CM | POA: Diagnosis not present

## 2017-07-03 DIAGNOSIS — M25512 Pain in left shoulder: Secondary | ICD-10-CM | POA: Diagnosis not present

## 2017-07-03 DIAGNOSIS — M7542 Impingement syndrome of left shoulder: Secondary | ICD-10-CM | POA: Diagnosis not present

## 2017-07-05 DIAGNOSIS — J301 Allergic rhinitis due to pollen: Secondary | ICD-10-CM | POA: Diagnosis not present

## 2017-07-05 DIAGNOSIS — J3089 Other allergic rhinitis: Secondary | ICD-10-CM | POA: Diagnosis not present

## 2017-07-12 DIAGNOSIS — J3089 Other allergic rhinitis: Secondary | ICD-10-CM | POA: Diagnosis not present

## 2017-07-12 DIAGNOSIS — J301 Allergic rhinitis due to pollen: Secondary | ICD-10-CM | POA: Diagnosis not present

## 2017-07-19 DIAGNOSIS — J3089 Other allergic rhinitis: Secondary | ICD-10-CM | POA: Diagnosis not present

## 2017-07-19 DIAGNOSIS — J301 Allergic rhinitis due to pollen: Secondary | ICD-10-CM | POA: Diagnosis not present

## 2017-07-26 DIAGNOSIS — J301 Allergic rhinitis due to pollen: Secondary | ICD-10-CM | POA: Diagnosis not present

## 2017-07-26 DIAGNOSIS — J3089 Other allergic rhinitis: Secondary | ICD-10-CM | POA: Diagnosis not present

## 2017-08-02 DIAGNOSIS — J3089 Other allergic rhinitis: Secondary | ICD-10-CM | POA: Diagnosis not present

## 2017-08-02 DIAGNOSIS — J301 Allergic rhinitis due to pollen: Secondary | ICD-10-CM | POA: Diagnosis not present

## 2017-08-09 DIAGNOSIS — J3089 Other allergic rhinitis: Secondary | ICD-10-CM | POA: Diagnosis not present

## 2017-08-09 DIAGNOSIS — J301 Allergic rhinitis due to pollen: Secondary | ICD-10-CM | POA: Diagnosis not present

## 2017-08-16 DIAGNOSIS — J301 Allergic rhinitis due to pollen: Secondary | ICD-10-CM | POA: Diagnosis not present

## 2017-08-16 DIAGNOSIS — J3089 Other allergic rhinitis: Secondary | ICD-10-CM | POA: Diagnosis not present

## 2017-08-20 DIAGNOSIS — J301 Allergic rhinitis due to pollen: Secondary | ICD-10-CM | POA: Diagnosis not present

## 2017-08-20 DIAGNOSIS — J3089 Other allergic rhinitis: Secondary | ICD-10-CM | POA: Diagnosis not present

## 2017-08-21 ENCOUNTER — Ambulatory Visit (INDEPENDENT_AMBULATORY_CARE_PROVIDER_SITE_OTHER): Payer: Medicare Other | Admitting: Internal Medicine

## 2017-08-21 ENCOUNTER — Other Ambulatory Visit (INDEPENDENT_AMBULATORY_CARE_PROVIDER_SITE_OTHER): Payer: Medicare Other

## 2017-08-21 ENCOUNTER — Encounter: Payer: Self-pay | Admitting: Internal Medicine

## 2017-08-21 VITALS — BP 100/70 | HR 96 | Temp 97.9°F | Ht 64.25 in | Wt 107.0 lb

## 2017-08-21 DIAGNOSIS — F411 Generalized anxiety disorder: Secondary | ICD-10-CM | POA: Diagnosis not present

## 2017-08-21 DIAGNOSIS — Z0000 Encounter for general adult medical examination without abnormal findings: Secondary | ICD-10-CM

## 2017-08-21 DIAGNOSIS — G4700 Insomnia, unspecified: Secondary | ICD-10-CM

## 2017-08-21 DIAGNOSIS — I10 Essential (primary) hypertension: Secondary | ICD-10-CM

## 2017-08-21 LAB — LIPID PANEL
CHOLESTEROL: 176 mg/dL (ref 0–200)
HDL: 84.6 mg/dL (ref >39.00)
LDL CALC: 61 mg/dL (ref 0–99)
NONHDL: 91.04
Total CHOL/HDL Ratio: 2
Triglycerides: 148 mg/dL (ref 0.0–149.0)
VLDL: 29.6 mg/dL (ref 0.0–40.0)

## 2017-08-21 LAB — CBC
HEMATOCRIT: 43.5 % (ref 36.0–46.0)
Hemoglobin: 14.9 g/dL (ref 12.0–15.0)
MCHC: 34.3 g/dL (ref 30.0–36.0)
MCV: 101.4 fl — ABNORMAL HIGH (ref 78.0–100.0)
Platelets: 293 10*3/uL (ref 150.0–400.0)
RBC: 4.29 Mil/uL (ref 3.87–5.11)
RDW: 13.2 % (ref 11.5–15.5)
WBC: 5.9 10*3/uL (ref 4.0–10.5)

## 2017-08-21 LAB — COMPREHENSIVE METABOLIC PANEL
ALBUMIN: 4.1 g/dL (ref 3.5–5.2)
ALK PHOS: 100 U/L (ref 39–117)
ALT: 16 U/L (ref 0–35)
AST: 21 U/L (ref 0–37)
BUN: 8 mg/dL (ref 6–23)
CO2: 28 mEq/L (ref 19–32)
Calcium: 9.3 mg/dL (ref 8.4–10.5)
Chloride: 100 mEq/L (ref 96–112)
Creatinine, Ser: 0.62 mg/dL (ref 0.40–1.20)
GFR: 98.37 mL/min (ref >60.00)
GLUCOSE: 83 mg/dL (ref 70–99)
POTASSIUM: 4 meq/L (ref 3.5–5.1)
SODIUM: 136 meq/L (ref 135–145)
TOTAL PROTEIN: 6.9 g/dL (ref 6.0–8.3)
Total Bilirubin: 0.4 mg/dL (ref 0.2–1.2)

## 2017-08-21 LAB — TSH: TSH: 1.33 u[IU]/mL (ref 0.35–4.50)

## 2017-08-21 NOTE — Patient Instructions (Signed)

## 2017-08-21 NOTE — Progress Notes (Signed)
   Subjective:    Patient ID: [name] [name], female    DOB: June 29, 1935, 82 y.o.   MRN: [id number]  HPI Here for medicare wellness, no new complaints. Please see A/P for status and treatment of chronic medical problems.   HPI #2: Here for follow up blood pressure (taking amlodipine, lasix prn, denies SOB or chest pains or headaches, no side effects, BP at goal), and insomnia (taking trazodone prn for sleep, denies side effects, it is still effective, wakes rested, generally sleeps 5-6 hours at least) and anxiety (taking xanax 1/2 to 1 TID prn which she has been on long term, she is trying to cut back some from previous, denies side effects, no memory change or falls, denies depression symptoms).   Diet: heart healthy Physical activity: active Depression/mood screen: negative Hearing: intact to whispered voice, mild loss bilaterally Visual acuity: grossly normal with lens, performs annual eye exam  ADLs: capable Fall risk: none Home safety: good Cognitive evaluation: intact to orientation, naming, recall and repetition EOL planning: adv directives discussed  I have personally reviewed and have noted 1. The patient's medical and social history - reviewed today no changes 2. Their use of alcohol, tobacco or illicit drugs 3. Their current medications and supplements 4. The patient's functional ability including ADL's, fall risks, home safety risks and hearing or visual impairment. 5. Diet and physical activities 6. Evidence for depression or mood disorders 7. Care team reviewed and updated (available in snapshot)  Review of Systems  Constitutional: Negative.   HENT: Negative.   Eyes: Negative.   Respiratory: Negative for cough, chest tightness and shortness of breath.   Cardiovascular: Negative for chest pain, palpitations and leg swelling.  Gastrointestinal: Negative for abdominal distention, abdominal pain, constipation, diarrhea, nausea and vomiting.  Musculoskeletal: Negative.     Skin: Negative.   Neurological: Negative.   Psychiatric/Behavioral: Negative.       Objective:   Physical Exam  Constitutional: She is oriented to person, place, and time. She appears well-developed and well-nourished.  HENT:  Head: Normocephalic and atraumatic.  Eyes: EOM are normal.  Neck: Normal range of motion.  Cardiovascular: Normal rate and regular rhythm.  Pulmonary/Chest: Effort normal and breath sounds normal. No respiratory distress. She has no wheezes. She has no rales.  Abdominal: Soft. Bowel sounds are normal. She exhibits no distension. There is no tenderness. There is no rebound.  Musculoskeletal: She exhibits no edema.  Neurological: She is alert and oriented to person, place, and time. Coordination normal.  Skin: Skin is warm and dry.  Psychiatric: She has a normal mood and affect.   Vitals:   08/21/17 0933  BP: 100/70  Pulse: 96  Temp: 97.9 F (36.6 C)  TempSrc: Oral  SpO2: 96%  Weight: 107 lb (48.5 kg)  Height: 5' 4.25" (1.632 m)      Assessment & Plan:

## 2017-08-23 DIAGNOSIS — J301 Allergic rhinitis due to pollen: Secondary | ICD-10-CM | POA: Diagnosis not present

## 2017-08-23 DIAGNOSIS — J3089 Other allergic rhinitis: Secondary | ICD-10-CM | POA: Diagnosis not present

## 2017-08-23 DIAGNOSIS — J3081 Allergic rhinitis due to animal (cat) (dog) hair and dander: Secondary | ICD-10-CM | POA: Diagnosis not present

## 2017-08-24 MED ORDER — AMLODIPINE BESYLATE 2.5 MG PO TABS: 2.5000 mg | ORAL_TABLET | Freq: Every day | ORAL | 3 refills | Status: DC

## 2017-08-24 MED ORDER — TRAZODONE HCL 100 MG PO TABS: ORAL_TABLET | ORAL | 3 refills | Status: DC

## 2017-08-24 NOTE — Assessment & Plan Note (Signed)
Refill trazodone which is still helping well for sleep. Adjust if needed.

## 2017-08-24 NOTE — Assessment & Plan Note (Signed)
Reminded about the risks of long term usage of xanax and she elects to continue. No red flags in fill record. No other controlled substances.

## 2017-08-24 NOTE — Assessment & Plan Note (Signed)
Taking amlodipine 2.5 mg daily and BP at goal, checking CMP and adjust as needed.

## 2017-08-24 NOTE — Assessment & Plan Note (Signed)
Flu shot yearly. Pneumonia done. Shingrix completed. Colonoscopy aged out. Mammogram aged out, pap smear aged out and dexa up to date. Counseled about sun safety and mole surveillance and gets derm check yearly due to prior cancer removal.  Counseled about the dangers of distracted driving. Given 10 year screening recommendations.

## 2017-08-30 DIAGNOSIS — J301 Allergic rhinitis due to pollen: Secondary | ICD-10-CM | POA: Diagnosis not present

## 2017-08-30 DIAGNOSIS — J3089 Other allergic rhinitis: Secondary | ICD-10-CM | POA: Diagnosis not present

## 2017-09-05 ENCOUNTER — Ambulatory Visit (INDEPENDENT_AMBULATORY_CARE_PROVIDER_SITE_OTHER): Payer: Medicare Other | Admitting: Obstetrics & Gynecology

## 2017-09-05 ENCOUNTER — Encounter: Payer: Self-pay | Admitting: Obstetrics & Gynecology

## 2017-09-05 VITALS — BP 136/84 | Ht 63.0 in | Wt 111.0 lb

## 2017-09-05 DIAGNOSIS — Z01419 Encounter for gynecological examination (general) (routine) without abnormal findings: Secondary | ICD-10-CM

## 2017-09-05 DIAGNOSIS — M8589 Other specified disorders of bone density and structure, multiple sites: Secondary | ICD-10-CM

## 2017-09-05 DIAGNOSIS — Z780 Asymptomatic menopausal state: Secondary | ICD-10-CM

## 2017-09-05 NOTE — Progress Notes (Signed)
[name] [name] 01-05-1936 [id number]   History:    82 y.o. G1P1L1 Widowed x 2017  RP:  Established patient presenting for annual gyn exam   HPI: Menopause, well on no HRT.  No PMB.  No pelvic pain.  Abstinent.  Urine/BMs wnl.  Breasts wnl.  Health labs with Fam MD.  BMI 19.66.  Active physically.  Colonoscopy 2009.  Past medical history,surgical history, family history and social history were all reviewed and documented in the EPIC chart.  Gynecologic History No LMP recorded. Patient is postmenopausal. Contraception: abstinence and post menopausal status Last Pap: Normal many years ago Last mammogram: 05/2016 Results were: Negative Bone Density: 07/2016 Osteopenia Colonoscopy: 2009  Obstetric History OB History  Gravida Para Term Preterm AB Living  1 1 1    0 1  SAB TAB Ectopic Multiple Live Births               # Outcome Date GA Lbr Len/2nd Weight Sex Delivery Anes PTL Lv  1 Term              ROS: A ROS was performed and pertinent positives and negatives are included in the history.  GENERAL: No fevers or chills. HEENT: No change in vision, no earache, sore throat or sinus congestion. NECK: No pain or stiffness. CARDIOVASCULAR: No chest pain or pressure. No palpitations. PULMONARY: No shortness of breath, cough or wheeze. GASTROINTESTINAL: No abdominal pain, nausea, vomiting or diarrhea, melena or bright red blood per rectum. GENITOURINARY: No urinary frequency, urgency, hesitancy or dysuria. MUSCULOSKELETAL: No joint or muscle pain, no back pain, no recent trauma. DERMATOLOGIC: No rash, no itching, no lesions. ENDOCRINE: No polyuria, polydipsia, no heat or cold intolerance. No recent change in weight. HEMATOLOGICAL: No anemia or easy bruising or bleeding. NEUROLOGIC: No headache, seizures, numbness, tingling or weakness. PSYCHIATRIC: No depression, no loss of interest in normal activity or change in sleep pattern.     Exam:   BP 136/84   Ht 5\' 3"  (1.6 m)   Wt 111 lb (50.3  kg)   BMI 19.66 kg/m   Body mass index is 19.66 kg/m.  General appearance : Well developed well nourished female. No acute distress HEENT: Eyes: no retinal hemorrhage or exudates,  Neck supple, trachea midline, no carotid bruits, no thyroidmegaly Lungs: Clear to auscultation, no rhonchi or wheezes, or rib retractions  Heart: Regular rate and rhythm, no murmurs or gallops Breast:Examined in sitting and supine position were symmetrical in appearance, no palpable masses or tenderness,  no skin retraction, no nipple inversion, no nipple discharge, no skin discoloration, no axillary or supraclavicular lymphadenopathy Abdomen: no palpable masses or tenderness, no rebound or guarding Extremities: no edema or skin discoloration or tenderness  Pelvic: Vulva: Normal             Vagina: No gross lesions or discharge  Cervix: No gross lesions or discharge  Uterus  AV, normal size, shape and consistency, non-tender and mobile  Adnexa  Without masses or tenderness  Anus: Normal   Assessment/Plan:  82 y.o. female for annual exam   1. Well female exam with routine gynecological exam Normal gynecologic exam and menopause.  No further Pap tests needed.  Breast exam normal.  Will schedule screening mammogram this year.  Health labs with family physician.  Patient will decide if prefers to do a screening colonoscopy or not.  2. Menopause present Well on no HRT.  No PMB.  Was using Estradiol cream, but irritating her.  Given  that and the fact that she is abstinent, I recommend not using it anymore.  Recommend Replens, a moisturizer, if feels dryness at vagina/vulva.  3. Osteopenia of multiple sites Continue with Vit D supplements, Ca++ rich nutrition and regular weight bearing physical activity.  Princess Bruins MD, 3:13 PM 09/05/2017

## 2017-09-06 DIAGNOSIS — J3089 Other allergic rhinitis: Secondary | ICD-10-CM | POA: Diagnosis not present

## 2017-09-06 DIAGNOSIS — J3081 Allergic rhinitis due to animal (cat) (dog) hair and dander: Secondary | ICD-10-CM | POA: Diagnosis not present

## 2017-09-06 DIAGNOSIS — J301 Allergic rhinitis due to pollen: Secondary | ICD-10-CM | POA: Diagnosis not present

## 2017-09-07 ENCOUNTER — Encounter: Payer: Self-pay | Admitting: Obstetrics & Gynecology

## 2017-09-07 NOTE — Patient Instructions (Signed)
1. Well female exam with routine gynecological exam Normal gynecologic exam and menopause.  No further Pap tests needed.  Breast exam normal.  Will schedule screening mammogram this year.  Health labs with family physician.  Patient will decide if prefers to do a screening colonoscopy or not.  2. Menopause present Well on no HRT.  No PMB.  Was using Estradiol cream, but irritating her.  Given that and the fact that she is abstinent, I recommend not using it anymore.  Recommend Replens, a moisturizer, if feels dryness at vagina/vulva.  3. Osteopenia of multiple sites Continue with Vit D supplements, Ca++ rich nutrition and regular weight bearing physical activity.  Tesha, it was a pleasure meeting you today!

## 2017-09-13 DIAGNOSIS — J3089 Other allergic rhinitis: Secondary | ICD-10-CM | POA: Diagnosis not present

## 2017-09-13 DIAGNOSIS — J301 Allergic rhinitis due to pollen: Secondary | ICD-10-CM | POA: Diagnosis not present

## 2017-09-19 DIAGNOSIS — J301 Allergic rhinitis due to pollen: Secondary | ICD-10-CM | POA: Diagnosis not present

## 2017-09-19 DIAGNOSIS — J3089 Other allergic rhinitis: Secondary | ICD-10-CM | POA: Diagnosis not present

## 2017-09-26 DIAGNOSIS — J3089 Other allergic rhinitis: Secondary | ICD-10-CM | POA: Diagnosis not present

## 2017-09-26 DIAGNOSIS — J301 Allergic rhinitis due to pollen: Secondary | ICD-10-CM | POA: Diagnosis not present

## 2017-10-01 ENCOUNTER — Encounter: Payer: Self-pay | Admitting: Women's Health

## 2017-10-01 ENCOUNTER — Ambulatory Visit (INDEPENDENT_AMBULATORY_CARE_PROVIDER_SITE_OTHER): Payer: Medicare Other | Admitting: Women's Health

## 2017-10-01 VITALS — BP 136/80

## 2017-10-01 DIAGNOSIS — R300 Dysuria: Secondary | ICD-10-CM

## 2017-10-01 DIAGNOSIS — N898 Other specified noninflammatory disorders of vagina: Secondary | ICD-10-CM

## 2017-10-01 LAB — WET PREP FOR TRICH, YEAST, CLUE

## 2017-10-01 MED ORDER — CLOBETASOL PROPIONATE 0.05 % EX OINT: 1.0000 "application " | TOPICAL_OINTMENT | Freq: Two times a day (BID) | CUTANEOUS | 0 refills | Status: DC

## 2017-10-01 NOTE — Progress Notes (Signed)
82 year old presents with complaint of vaginal irritation with itching, slight burning at initiation of urination for the past several days.  Denies pain at end of stream of urination, abdominal pain, fever or back pain.  States has had a yeast infection in the past, symptoms similar, Temovate helped.  Not sexually active.  Medical problems and include hypertension, coronary artery disease, MVP.  Postmenopausal on no HRT with no bleeding.  Exam: Appears well.  No CVAT.  Abdomen soft without rebound or radiation.  External genitalia mild erythema and introitus, wet prep done with a Q-tip, negative. UA: +2 leukocytes, 10-20 WBCs, no RBCs, moderate bacteria, 20-40 squamous epithelials,  Vaginal irritation with itching  Pain: Temovate .05 %,  apply externally twice daily at vaginal introitus, loose clothing, open to air as able, call if no relief of symptoms.  Urine culture pending.

## 2017-10-01 NOTE — Patient Instructions (Signed)

## 2017-10-04 ENCOUNTER — Other Ambulatory Visit: Payer: Self-pay | Admitting: Women's Health

## 2017-10-04 DIAGNOSIS — J301 Allergic rhinitis due to pollen: Secondary | ICD-10-CM | POA: Diagnosis not present

## 2017-10-04 DIAGNOSIS — J3089 Other allergic rhinitis: Secondary | ICD-10-CM | POA: Diagnosis not present

## 2017-10-04 LAB — URINE CULTURE
MICRO NUMBER:: 90670166
SPECIMEN QUALITY: ADEQUATE

## 2017-10-04 LAB — URINALYSIS, COMPLETE W/RFL CULTURE
BILIRUBIN URINE: NEGATIVE
GLUCOSE, UA: NEGATIVE
HYALINE CAST: NONE SEEN /LPF
Hgb urine dipstick: NEGATIVE
Ketones, ur: NEGATIVE
NITRITES URINE, INITIAL: NEGATIVE
Protein, ur: NEGATIVE
RBC / HPF: NONE SEEN /HPF (ref 0–2)
SPECIFIC GRAVITY, URINE: 1.01 (ref 1.001–1.03)
pH: 7 (ref 5.0–8.0)

## 2017-10-04 LAB — CULTURE INDICATED

## 2017-10-04 MED ORDER — NITROFURANTOIN MONOHYD MACRO 100 MG PO CAPS: 100.0000 mg | ORAL_CAPSULE | Freq: Two times a day (BID) | ORAL | 0 refills | Status: DC

## 2017-10-09 ENCOUNTER — Telehealth: Payer: Self-pay | Admitting: *Deleted

## 2017-10-09 NOTE — Telephone Encounter (Signed)
Patient informed. 

## 2017-10-09 NOTE — Telephone Encounter (Signed)
Patient called states temovate cream is not working still having vagina burning and itching, she said cream has worked before, but now it doesn't appear to be helping. No urinary symptoms.  Please advise

## 2017-10-09 NOTE — Telephone Encounter (Signed)
Please call her and have her try plain A&D ointment externally, loose clothing and see if that helps.

## 2017-10-11 DIAGNOSIS — J301 Allergic rhinitis due to pollen: Secondary | ICD-10-CM | POA: Diagnosis not present

## 2017-10-11 DIAGNOSIS — J3089 Other allergic rhinitis: Secondary | ICD-10-CM | POA: Diagnosis not present

## 2017-10-17 DIAGNOSIS — J301 Allergic rhinitis due to pollen: Secondary | ICD-10-CM | POA: Diagnosis not present

## 2017-10-17 DIAGNOSIS — J3089 Other allergic rhinitis: Secondary | ICD-10-CM | POA: Diagnosis not present

## 2017-10-24 DIAGNOSIS — J3089 Other allergic rhinitis: Secondary | ICD-10-CM | POA: Diagnosis not present

## 2017-10-24 DIAGNOSIS — J301 Allergic rhinitis due to pollen: Secondary | ICD-10-CM | POA: Diagnosis not present

## 2017-10-31 DIAGNOSIS — J3089 Other allergic rhinitis: Secondary | ICD-10-CM | POA: Diagnosis not present

## 2017-10-31 DIAGNOSIS — J301 Allergic rhinitis due to pollen: Secondary | ICD-10-CM | POA: Diagnosis not present

## 2017-11-02 ENCOUNTER — Other Ambulatory Visit: Payer: Self-pay | Admitting: Internal Medicine

## 2017-11-06 DIAGNOSIS — J301 Allergic rhinitis due to pollen: Secondary | ICD-10-CM | POA: Diagnosis not present

## 2017-11-06 DIAGNOSIS — J3089 Other allergic rhinitis: Secondary | ICD-10-CM | POA: Diagnosis not present

## 2017-11-14 ENCOUNTER — Telehealth: Payer: Self-pay

## 2017-11-14 DIAGNOSIS — J301 Allergic rhinitis due to pollen: Secondary | ICD-10-CM | POA: Diagnosis not present

## 2017-11-14 DIAGNOSIS — H9190 Unspecified hearing loss, unspecified ear: Secondary | ICD-10-CM

## 2017-11-14 DIAGNOSIS — J3089 Other allergic rhinitis: Secondary | ICD-10-CM | POA: Diagnosis not present

## 2017-11-14 NOTE — Telephone Encounter (Signed)
Patient wrote a letter stating she needs a referral to have a hearing test done at [facility] [place] by Dr. Joline Salt. States that she has been having trouble hearing and wanted to get a base line hearing test. Please advise. Thank you

## 2017-11-15 NOTE — Addendum Note (Signed)
Addended by: Raford Pitcher R on: 11/15/2017 11:18 AM   Modules accepted: Orders

## 2017-11-15 NOTE — Telephone Encounter (Signed)
Okay to do referral

## 2017-11-22 DIAGNOSIS — J3089 Other allergic rhinitis: Secondary | ICD-10-CM | POA: Diagnosis not present

## 2017-11-22 DIAGNOSIS — J301 Allergic rhinitis due to pollen: Secondary | ICD-10-CM | POA: Diagnosis not present

## 2017-11-28 DIAGNOSIS — J301 Allergic rhinitis due to pollen: Secondary | ICD-10-CM | POA: Diagnosis not present

## 2017-11-28 DIAGNOSIS — J3089 Other allergic rhinitis: Secondary | ICD-10-CM | POA: Diagnosis not present

## 2017-12-03 DIAGNOSIS — L298 Other pruritus: Secondary | ICD-10-CM | POA: Diagnosis not present

## 2017-12-03 DIAGNOSIS — C44619 Basal cell carcinoma of skin of left upper limb, including shoulder: Secondary | ICD-10-CM | POA: Diagnosis not present

## 2017-12-03 DIAGNOSIS — L821 Other seborrheic keratosis: Secondary | ICD-10-CM | POA: Diagnosis not present

## 2017-12-03 DIAGNOSIS — D2372 Other benign neoplasm of skin of left lower limb, including hip: Secondary | ICD-10-CM | POA: Diagnosis not present

## 2017-12-03 DIAGNOSIS — D225 Melanocytic nevi of trunk: Secondary | ICD-10-CM | POA: Diagnosis not present

## 2017-12-03 DIAGNOSIS — L820 Inflamed seborrheic keratosis: Secondary | ICD-10-CM | POA: Diagnosis not present

## 2017-12-05 ENCOUNTER — Encounter: Payer: Self-pay | Admitting: Obstetrics & Gynecology

## 2017-12-05 ENCOUNTER — Ambulatory Visit (INDEPENDENT_AMBULATORY_CARE_PROVIDER_SITE_OTHER): Payer: Medicare Other | Admitting: Obstetrics & Gynecology

## 2017-12-05 VITALS — BP 140/82

## 2017-12-05 DIAGNOSIS — J301 Allergic rhinitis due to pollen: Secondary | ICD-10-CM | POA: Diagnosis not present

## 2017-12-05 DIAGNOSIS — N6314 Unspecified lump in the right breast, lower inner quadrant: Secondary | ICD-10-CM | POA: Diagnosis not present

## 2017-12-05 DIAGNOSIS — J3089 Other allergic rhinitis: Secondary | ICD-10-CM | POA: Diagnosis not present

## 2017-12-05 NOTE — Progress Notes (Signed)
    [name] [name] 1936/02/18 [id number]        82 y.o.  G1P1001 Widowed  RP: Small right breast lump noticed last night  HPI: Noticed a bruise on the right breast as she was taking a shower last night.  Palpating that area, she felt a lump.  Does not remember any trauma.  The area is not painful, but is mildly tender.  No first degree relative with Breast Ca.  Screening Mammo benign in 06/2017.  Abstinent.   OB History  Gravida Para Term Preterm AB Living  1 1 1    0 1  SAB TAB Ectopic Multiple Live Births               # Outcome Date GA Lbr Len/2nd Weight Sex Delivery Anes PTL Lv  1 Term             Past medical history,surgical history, problem list, medications, allergies, family history and social history were all reviewed and documented in the EPIC chart.   Directed ROS with pertinent positives and negatives documented in the history of present illness/assessment and plan.  Exam:  Vitals:   12/05/17 1452  BP: 140/82   General appearance:  Normal  Left Breast normal.  No Lt axillary LN felt.  Right Breast:  Ecchymosis at 4 O'clock, 1.5 x 1.5 cm with a small hard nodule about 1 cm x 0.5 cm, superficially in the middle of the bruise.  Mildly tender.  The small nodule is mobile, well defined.  No nipple discharge.  No Rt axillary LN felt.   Assessment/Plan:  82 y.o. G1P1001   1. Breast lump on right side at 4 o'clock position Small nodule within an ecchymosis.  Probably a small trauma process.  Decision to proceed nonetheless with a right diagnostic mammogram and ultrasound.  Patient reassured.  Counseling on above issues and coordination of care more than 50% for 15 minutes.  Princess Bruins MD, 3:07 PM 12/05/2017

## 2017-12-06 ENCOUNTER — Telehealth: Payer: Self-pay | Admitting: *Deleted

## 2017-12-06 NOTE — Telephone Encounter (Signed)
Patient scheduled at [facility] on 12/13/17 2 9:00am ,order faxed. Patient aware.

## 2017-12-06 NOTE — Telephone Encounter (Signed)
-----   Message from Princess Bruins, MD sent at 12/05/2017  3:22 PM EDT ----- Regarding: Refer to [facility] for Rt Dx Mammo/Breast US Rt breast ecchymosis/nodule at 4 O'clock, mildly tender to palpation, 1 x 1.5 cm, mobile.  Didn't notice bumping in anything.Marland KitchenMarland Kitchen

## 2017-12-07 ENCOUNTER — Encounter: Payer: Self-pay | Admitting: Obstetrics & Gynecology

## 2017-12-07 NOTE — Patient Instructions (Addendum)
1. Breast lump on right side at 4 o'clock position Small nodule within an ecchymosis.  Probably a small trauma process.  Decision to proceed nonetheless with a right diagnostic mammogram and ultrasound.  Patient reassured.  Luci, it was a pleasure seeing you today!

## 2017-12-11 ENCOUNTER — Encounter: Payer: Self-pay | Admitting: Internal Medicine

## 2017-12-11 DIAGNOSIS — N6313 Unspecified lump in the right breast, lower outer quadrant: Secondary | ICD-10-CM | POA: Diagnosis not present

## 2017-12-11 DIAGNOSIS — R922 Inconclusive mammogram: Secondary | ICD-10-CM | POA: Diagnosis not present

## 2017-12-11 DIAGNOSIS — N6311 Unspecified lump in the right breast, upper outer quadrant: Secondary | ICD-10-CM | POA: Diagnosis not present

## 2017-12-11 LAB — HM MAMMOGRAPHY

## 2017-12-12 ENCOUNTER — Encounter: Payer: Self-pay | Admitting: Internal Medicine

## 2017-12-12 DIAGNOSIS — J3089 Other allergic rhinitis: Secondary | ICD-10-CM | POA: Diagnosis not present

## 2017-12-12 DIAGNOSIS — J301 Allergic rhinitis due to pollen: Secondary | ICD-10-CM | POA: Diagnosis not present

## 2017-12-12 NOTE — Progress Notes (Signed)
Abstracted and sent to scan  

## 2017-12-19 DIAGNOSIS — J301 Allergic rhinitis due to pollen: Secondary | ICD-10-CM | POA: Diagnosis not present

## 2017-12-19 DIAGNOSIS — J3089 Other allergic rhinitis: Secondary | ICD-10-CM | POA: Diagnosis not present

## 2017-12-23 NOTE — Progress Notes (Signed)
HPI The patient presents for followup of hypertension and palpitations.   She had dyspnea in 2016.  However, POET (Plain Old Exercise Treadmill) was negative for evidence of ischemia.  She returns for follow up.  She has been cleaning out her house since her husband died last 09-Feb-2023.  He does volunteering at the hospital.  She does do some exercises at the gym and at home.  She denies any new cardiovascular symptoms. The patient denies any new symptoms such as chest discomfort, neck or arm discomfort. There has been no new shortness of breath, PND or orthopnea. There have been no reported palpitations, presyncope or syncope.   Allergies  Allergen Reactions  . Codeine     REACTION: NAUSEA  . Erythromycin     REACTION: NAUSEA  . Other     Environmental allergies    Current Outpatient Medications  Medication Sig Dispense Refill  . ALPRAZolam (XANAX) 0.5 MG tablet TAKE 1/2 TO 1 TABLET THREE TIMES DAILY AS NEEDED. 70 tablet 5  . amLODipine (NORVASC) 2.5 MG tablet Take 1 tablet (2.5 mg total) by mouth daily. 90 tablet 3  . aspirin 81 MG tablet Take 81 mg by mouth daily.      Marland Kitchen BIOTIN 5000 PO Take by mouth 2 (two) times daily.     . calcium carbonate (CALCIUM 600) 600 MG TABS Take 600 mg by mouth daily.     . cetirizine (ZYRTEC) 10 MG tablet Take 10 mg by mouth daily.      . Cholecalciferol (VITAMIN D) 2000 units tablet Take 2,000 Units by mouth daily.    . cyclobenzaprine (FLEXERIL) 10 MG tablet Take 1 tablet (10 mg total) by mouth 3 (three) times daily as needed. 30 tablet 1  . EPINEPHrine (EPI-PEN) 0.3 mg/0.3 mL DEVI Inject 0.3 mg into the muscle as needed.    . fish oil-omega-3 fatty acids 1000 MG capsule Take 1 g by mouth daily.      . folic acid (FOLVITE) 419 MCG tablet Take 800 mcg by mouth daily.    . furosemide (LASIX) 20 MG tablet Take 1 tablet (20 mg total) by mouth daily as needed. 30 tablet 3  . Multiple Vitamins-Minerals (PROTEGRA PO) Take by mouth daily.      .  nitroGLYCERIN (NITROSTAT) 0.4 MG SL tablet Place 1 tablet (0.4 mg total) under the tongue every 5 (five) minutes as needed. May repeat 3 times 25 tablet 2  . NONFORMULARY OR COMPOUNDED ITEM Estradiol 0.02% cream 1/4 ml twice weekly 24 each 2  . traZODone (DESYREL) 100 MG tablet TAKE ONE TABLET AT BEDTIME. 90 tablet 3  . TURMERIC PO Take 1,500 mg by mouth daily.    Marland Kitchen UNABLE TO FIND Allergy shots weekly     No current facility-administered medications for this visit.     Past Medical History:  Diagnosis Date  . Anxiety   . Coronary artery spasm (HCC)    Non obstructive CAD  . Diverticulosis of colon   . Fibromyalgia   . Hx of colonic polyps   . Lumbar back pain   . MVP (mitral valve prolapse)    No antibiotics required for procedures  . Osteoporosis   . Palpitations   . Pneumonia   . Shingles     Past Surgical History:  Procedure Laterality Date  . CHOLECYSTECTOMY  03/2003  . LUMBAR LAMINECTOMY    . TONSILLECTOMY     as a child    ROS:   As  stated in the HPI and negative for all other systems.  PHYSICAL EXAM BP 100/68   Pulse 84   Ht 5' 3.75" (1.619 m)   Wt 108 lb (49 kg)   BMI 18.68 kg/m   GENERAL:  Well appearing NECK:  No jugular venous distention, waveform within normal limits, carotid upstroke brisk and symmetric, no bruits, no thyromegaly LUNGS:  Clear to auscultation bilaterally CHEST:  Unremarkable HEART:  PMI not displaced or sustained,S1 and S2 within normal limits, no S3, no S4, no clicks, no rubs, no murmurs ABD:  Flat, positive bowel sounds normal in frequency in pitch, no bruits, no rebound, no guarding, no midline pulsatile mass, no hepatomegaly, no splenomegaly EXT:  2 plus pulses throughout, no edema, no cyanosis no clubbing    EKG: Normal sinus rhythm, rate 84, left axis, LAFB, poor anterior R-wave progression, no acute ST-T wave changes. No change from previous.  12/24/2017  ASSESSMENT AND PLAN  PALPITATIONS: These are not particularly  problematic.  No change in therapy is indicated.  SOB: This is improved.  I encouraged continued exercise.  No change in therapy.  ABNORMAL EKG: We have talked about the poor anterior R wave progression and axis that is consistent with fascicular block.  However this is unchanged and she is had no structural heart disease and I do not suspect an old heart attack.  No change in therapy.

## 2017-12-24 ENCOUNTER — Encounter: Payer: Self-pay | Admitting: Cardiology

## 2017-12-24 ENCOUNTER — Ambulatory Visit (INDEPENDENT_AMBULATORY_CARE_PROVIDER_SITE_OTHER): Payer: Medicare Other | Admitting: Cardiology

## 2017-12-24 VITALS — BP 100/68 | HR 84 | Ht 63.75 in | Wt 108.0 lb

## 2017-12-24 DIAGNOSIS — R002 Palpitations: Secondary | ICD-10-CM | POA: Diagnosis not present

## 2017-12-24 DIAGNOSIS — R0602 Shortness of breath: Secondary | ICD-10-CM

## 2017-12-24 NOTE — Patient Instructions (Signed)

## 2017-12-25 ENCOUNTER — Ambulatory Visit (INDEPENDENT_AMBULATORY_CARE_PROVIDER_SITE_OTHER)
Admission: RE | Admit: 2017-12-25 | Discharge: 2017-12-25 | Disposition: A | Discharge status: Home / Self Care | Payer: Medicare Other | Source: Ambulatory Visit | Attending: Internal Medicine | Admitting: Internal Medicine

## 2017-12-25 ENCOUNTER — Ambulatory Visit (INDEPENDENT_AMBULATORY_CARE_PROVIDER_SITE_OTHER): Payer: Medicare Other | Admitting: Internal Medicine

## 2017-12-25 ENCOUNTER — Encounter: Payer: Self-pay | Admitting: Internal Medicine

## 2017-12-25 VITALS — BP 142/80 | HR 93 | Temp 97.9°F | Ht 63.75 in | Wt 108.0 lb

## 2017-12-25 DIAGNOSIS — M5442 Lumbago with sciatica, left side: Secondary | ICD-10-CM

## 2017-12-25 DIAGNOSIS — H2513 Age-related nuclear cataract, bilateral: Secondary | ICD-10-CM | POA: Diagnosis not present

## 2017-12-25 DIAGNOSIS — M5441 Lumbago with sciatica, right side: Secondary | ICD-10-CM | POA: Insufficient documentation

## 2017-12-25 DIAGNOSIS — M47816 Spondylosis without myelopathy or radiculopathy, lumbar region: Secondary | ICD-10-CM | POA: Diagnosis not present

## 2017-12-25 MED ORDER — CYCLOBENZAPRINE HCL 5 MG PO TABS: 5.0000 mg | ORAL_TABLET | Freq: Three times a day (TID) | ORAL | 1 refills | Status: DC | PRN

## 2017-12-25 MED ORDER — KETOROLAC TROMETHAMINE 60 MG/2ML IM SOLN
60.0000 mg | Freq: Once | INTRAMUSCULAR | Status: AC
  Administered 2017-12-25: 30 mg via INTRAMUSCULAR

## 2017-12-25 MED ORDER — PREDNISONE 20 MG PO TABS: 40.0000 mg | ORAL_TABLET | Freq: Every day | ORAL | 0 refills | Status: DC

## 2017-12-25 NOTE — Patient Instructions (Signed)
We are checking the x-ray today of the low back.  We have given you a shot today for the back pain.  We have sent in prednisone to start taking today, take 2 pills daily for 5 days.   We have also sent in muscle relaxers to help with pain until the prednisone starts working.   Call us back in 1 week if you are not doing better.

## 2017-12-25 NOTE — Assessment & Plan Note (Signed)
Given toradol 30 mg IM today, rx for prednisone and flexeril for pain. Checking x-ray lumbar today to rule out compression fracture given the severity of the pain and acute onset and osteopenia.

## 2017-12-25 NOTE — Progress Notes (Signed)
   Subjective:    Patient ID: [name] [name], female    DOB: May 07, 1935, 82 y.o.   MRN: [id number]  HPI The patient is an 82 YO female coming in for muscle cramps and spasms. More often in the left low back. Has had back surgery on the low back on the right and it feels similar. Severe pain in the left low back and goes into the left leg. Has tried tylenol and aspercreme and lidocaine without any relief. Worse with standing, walking, bending. Started Sunday evening and has worsened since that time. Overall it is 10/10 pain. Denies numbness in the leg. Some feeling of weakness. Denies bladder or bowel change.   Review of Systems  Constitutional: Positive for activity change. Negative for appetite change, chills, fatigue, fever and unexpected weight change.  Respiratory: Negative.   Cardiovascular: Negative.   Gastrointestinal: Negative.   Musculoskeletal: Positive for arthralgias, back pain and myalgias. Negative for gait problem and joint swelling.  Skin: Negative.   Neurological: Negative for numbness.      Objective:   Physical Exam  Constitutional: She is oriented to person, place, and time. She appears well-developed and well-nourished.  In pain during the visit  HENT:  Head: Normocephalic and atraumatic.  Eyes: EOM are normal.  Neck: Normal range of motion.  Cardiovascular: Normal rate and regular rhythm.  Pulmonary/Chest: Effort normal and breath sounds normal. No respiratory distress. She has no wheezes. She has no rales.  Musculoskeletal: She exhibits tenderness. She exhibits no edema.  Neurological: She is alert and oriented to person, place, and time. Coordination normal.  Skin: Skin is warm and dry.   Vitals:   12/25/17 1032  BP: (!) 142/80  Pulse: 93  Temp: 97.9 F (36.6 C)  TempSrc: Oral  SpO2: 99%  Weight: 108 lb (49 kg)  Height: 5' 3.75" (1.619 m)      Assessment & Plan:  Toradol 30 mg IM given at visit

## 2017-12-26 DIAGNOSIS — H903 Sensorineural hearing loss, bilateral: Secondary | ICD-10-CM | POA: Diagnosis not present

## 2017-12-27 DIAGNOSIS — J3089 Other allergic rhinitis: Secondary | ICD-10-CM | POA: Diagnosis not present

## 2017-12-27 DIAGNOSIS — J301 Allergic rhinitis due to pollen: Secondary | ICD-10-CM | POA: Diagnosis not present

## 2018-01-03 DIAGNOSIS — J301 Allergic rhinitis due to pollen: Secondary | ICD-10-CM | POA: Diagnosis not present

## 2018-01-03 DIAGNOSIS — J3089 Other allergic rhinitis: Secondary | ICD-10-CM | POA: Diagnosis not present

## 2018-01-10 DIAGNOSIS — J301 Allergic rhinitis due to pollen: Secondary | ICD-10-CM | POA: Diagnosis not present

## 2018-01-10 DIAGNOSIS — J3089 Other allergic rhinitis: Secondary | ICD-10-CM | POA: Diagnosis not present

## 2018-01-21 ENCOUNTER — Encounter: Payer: Self-pay | Admitting: Nurse Practitioner

## 2018-01-21 ENCOUNTER — Ambulatory Visit (INDEPENDENT_AMBULATORY_CARE_PROVIDER_SITE_OTHER): Payer: Medicare Other | Admitting: Nurse Practitioner

## 2018-01-21 VITALS — BP 148/80 | HR 87 | Temp 98.7°F | Ht 63.75 in | Wt 108.0 lb

## 2018-01-21 DIAGNOSIS — J40 Bronchitis, not specified as acute or chronic: Secondary | ICD-10-CM

## 2018-01-21 DIAGNOSIS — R05 Cough: Secondary | ICD-10-CM | POA: Diagnosis not present

## 2018-01-21 DIAGNOSIS — R059 Cough, unspecified: Secondary | ICD-10-CM

## 2018-01-21 MED ORDER — CEFDINIR 300 MG PO CAPS: 300.0000 mg | ORAL_CAPSULE | Freq: Two times a day (BID) | ORAL | 0 refills | Status: DC

## 2018-01-21 MED ORDER — BENZONATATE 100 MG PO CAPS: 100.0000 mg | ORAL_CAPSULE | Freq: Two times a day (BID) | ORAL | 0 refills | Status: DC | PRN

## 2018-01-21 NOTE — Progress Notes (Signed)
Name: CAROLEE CHANNELL   MRN: [id number]    DOB: 1936/04/26   Date:01/21/2018       Progress Note  Subjective  Chief Complaint  Chief Complaint  Patient presents with  . Sinusitis    x 6 days, she has tried flonase and other OTC meds with no relief  . Cough    HPI  Ms Briley is here today for evaluation of an acute complaint of cough, cold symptoms, which began about a week ago. She reports onset of sore throat, rhinorrhea, productive cough, body aches after some construction was done on her house last week, symptoms have been persistent since. She has noticed over the past few days fevers, chills, headaches as well. Denies syncope, chest pain, abdominal pain, nausea, vomiting. Shes tried flonase, zytrec, mucinex, eye drops, tylenol at home with only temporary relief. Drinking hot tea with ginger and honey daily. She reports hx allergies, bronchitis   Patient Active Problem List   Diagnosis Date Noted  . Acute left-sided low back pain with left-sided sciatica 12/25/2017  . Asthmatic bronchitis 07/24/2016  . Laryngopharyngeal reflux (LPR) 07/24/2016  . Routine general medical examination at a health care facility 08/11/2014  . Osteopenia 07/23/2013  . Insomnia 01/27/2013  . Vaginal atrophy 07/22/2012  . MVP (mitral valve prolapse)   . Allergic rhinitis 02/28/2011  . Irritable bowel syndrome 11/17/2007  . Essential hypertension 05/21/2007  . Coronary atherosclerosis 05/21/2007  . Anxiety state 05/20/2007  . Fibromyalgia 05/20/2007    Social History   Tobacco Use  . Smoking status: Former Smoker    Packs/day: 0.50    Years: 15.00    Pack years: 7.50    Last attempt to quit: 05/01/1978    Years since quitting: 39.7  . Smokeless tobacco: Never Used  Substance Use Topics  . Alcohol use: Yes    Alcohol/week: 9.0 standard drinks    Types: 9 Glasses of wine per week     Current Outpatient Medications:  .  ALPRAZolam (XANAX) 0.5 MG tablet, TAKE 1/2 TO 1 TABLET THREE  TIMES DAILY AS NEEDED., Disp: 70 tablet, Rfl: 5 .  amLODipine (NORVASC) 2.5 MG tablet, Take 1 tablet (2.5 mg total) by mouth daily., Disp: 90 tablet, Rfl: 3 .  aspirin 81 MG tablet, Take 81 mg by mouth daily.  , Disp: , Rfl:  .  BIOTIN 5000 PO, Take by mouth 2 (two) times daily. , Disp: , Rfl:  .  calcium carbonate (CALCIUM 600) 600 MG TABS, Take 600 mg by mouth daily. , Disp: , Rfl:  .  cetirizine (ZYRTEC) 10 MG tablet, Take 10 mg by mouth daily.  , Disp: , Rfl:  .  Cholecalciferol (VITAMIN D) 2000 units tablet, Take 2,000 Units by mouth daily., Disp: , Rfl:  .  cyclobenzaprine (FLEXERIL) 5 MG tablet, Take 1 tablet (5 mg total) by mouth 3 (three) times daily as needed for muscle spasms., Disp: 30 tablet, Rfl: 1 .  EPINEPHrine (EPI-PEN) 0.3 mg/0.3 mL DEVI, Inject 0.3 mg into the muscle as needed., Disp: , Rfl:  .  fish oil-omega-3 fatty acids 1000 MG capsule, Take 1 g by mouth daily.  , Disp: , Rfl:  .  folic acid (FOLVITE) 182 MCG tablet, Take 800 mcg by mouth daily., Disp: , Rfl:  .  furosemide (LASIX) 20 MG tablet, Take 1 tablet (20 mg total) by mouth daily as needed., Disp: 30 tablet, Rfl: 3 .  Multiple Vitamins-Minerals (PROTEGRA PO), Take by mouth daily.  , Disp: ,  Rfl:  .  nitroGLYCERIN (NITROSTAT) 0.4 MG SL tablet, Place 1 tablet (0.4 mg total) under the tongue every 5 (five) minutes as needed. May repeat 3 times, Disp: 25 tablet, Rfl: 2 .  NONFORMULARY OR COMPOUNDED ITEM, Estradiol 0.02% cream 1/4 ml twice weekly, Disp: 24 each, Rfl: 2 .  traZODone (DESYREL) 100 MG tablet, TAKE ONE TABLET AT BEDTIME., Disp: 90 tablet, Rfl: 3 .  TURMERIC PO, Take 1,500 mg by mouth daily., Disp: , Rfl:  .  UNABLE TO FIND, Allergy shots weekly, Disp: , Rfl:   Allergies  Allergen Reactions  . Codeine     REACTION: NAUSEA  . Erythromycin     REACTION: NAUSEA  . Other     Environmental allergies    ROS  No other specific complaints in a complete review of systems (except as listed in HPI  above).  Objective  Vitals:   01/21/18 1019  BP: (!) 148/80  Pulse: 87  Temp: 98.7 F (37.1 C)  TempSrc: Oral  SpO2: 98%  Weight: 108 lb (49 kg)  Height: 5' 3.75" (1.619 m)    Body mass index is 18.68 kg/m.  Nursing Note and Vital Signs reviewed.  Physical Exam  Constitutional: Patient appears well-developed and well-nourished.  No distress.  HEENT: head atraumatic, normocephalic, pupils equal and reactive to light, EOM's intact, TM's without erythema or bulging, no maxillary or frontal sinus tenderness , neck supple without lymphadenopathy, oropharynx pink and moist without exudate Cardiovascular: Normal rate, regular rhythm, S1/S2 present. No BLE edema. Distal pulses intact. Pulmonary/Chest: Effort normal and breath sounds clear. No respiratory distress or retractions. Neurological: She is alert and oriented to person, place, and time. No cranial nerve deficit. Coordination, balance, strength, speech and gait are normal.  Skin: Skin is warm and dry. No rash noted. No erythema.  Psychiatric: Patient has a normal mood and affect. behavior is normal. Judgment and thought content normal.   Assessment & Plan  Cough, Bronchitis Due to duration of symptoms, worsening and history of bronchitis will treat with course of antibiotics, tessalon prn for cough-dosing and side effects discussed  Home management, Red flags and when to present for emergency care or RTC including fever >101.59F, chest pain, shortness of breath, new/worsening/un-resolving symptoms, reviewed with patient at time of visit. Follow up and care instructions discussed and provided in AVS. - benzonatate (TESSALON) 100 MG capsule; Take 1 capsule (100 mg total) by mouth 2 (two) times daily as needed for cough.  Dispense: 20 capsule; Refill: 0 - cefdinir (OMNICEF) 300 MG capsule; Take 1 capsule (300 mg total) by mouth 2 (two) times daily.  Dispense: 14 capsule; Refill: 0

## 2018-01-21 NOTE — Patient Instructions (Signed)
Please start omnicef 300mg  twice daily for 7 days  Tessalon for cough  Continue flonase, zytrec  Please follow up for fevers over 101, if your symptoms get worse, or if your symptoms dont get better with the antibiotic.   Upper Respiratory Infection, Adult Most upper respiratory infections (URIs) are caused by a virus. A URI affects the nose, throat, and upper air passages. The most common type of URI is often called "the common cold." Follow these instructions at home:  Take medicines only as told by your doctor.  Gargle warm saltwater or take cough drops to comfort your throat as told by your doctor.  Use a warm mist humidifier or inhale steam from a shower to increase air moisture. This may make it easier to breathe.  Drink enough fluid to keep your pee (urine) clear or pale yellow.  Eat soups and other clear broths.  Have a healthy diet.  Rest as needed.  Go back to work when your fever is gone or your doctor says it is okay. ? You may need to stay home longer to avoid giving your URI to others. ? You can also wear a face mask and wash your hands often to prevent spread of the virus.  Use your inhaler more if you have asthma.  Do not use any tobacco products, including cigarettes, chewing tobacco, or electronic cigarettes. If you need help quitting, ask your doctor. Contact a doctor if:  You are getting worse, not better.  Your symptoms are not helped by medicine.  You have chills.  You are getting more short of breath.  You have brown or red mucus.  You have yellow or brown discharge from your nose.  You have pain in your face, especially when you bend forward.  You have a fever.  You have puffy (swollen) neck glands.  You have pain while swallowing.  You have white areas in the back of your throat. Get help right away if:  You have very bad or constant: ? Headache. ? Ear pain. ? Pain in your forehead, behind your eyes, and over your cheekbones  (sinus pain). ? Chest pain.  You have long-lasting (chronic) lung disease and any of the following: ? Wheezing. ? Long-lasting cough. ? Coughing up blood. ? A change in your usual mucus.  You have a stiff neck.  You have changes in your: ? Vision. ? Hearing. ? Thinking. ? Mood. This information is not intended to replace advice given to you by your health care provider. Make sure you discuss any questions you have with your health care provider. Document Released: 10/04/2007 Document Revised: 12/19/2015 Document Reviewed: 07/23/2013 Elsevier Interactive Patient Education  2018 [employer].

## 2018-01-31 ENCOUNTER — Encounter: Payer: Self-pay | Admitting: Internal Medicine

## 2018-01-31 ENCOUNTER — Ambulatory Visit (INDEPENDENT_AMBULATORY_CARE_PROVIDER_SITE_OTHER): Payer: Medicare Other | Admitting: Internal Medicine

## 2018-01-31 DIAGNOSIS — M5442 Lumbago with sciatica, left side: Secondary | ICD-10-CM | POA: Diagnosis not present

## 2018-01-31 DIAGNOSIS — J301 Allergic rhinitis due to pollen: Secondary | ICD-10-CM | POA: Diagnosis not present

## 2018-01-31 DIAGNOSIS — J3089 Other allergic rhinitis: Secondary | ICD-10-CM | POA: Diagnosis not present

## 2018-01-31 MED ORDER — PREDNISONE 20 MG PO TABS: 40.0000 mg | ORAL_TABLET | Freq: Every day | ORAL | 0 refills | Status: DC

## 2018-01-31 NOTE — Patient Instructions (Signed)
We have sent in prednisone to take 2 pills daily for 5 days.   

## 2018-02-01 ENCOUNTER — Encounter: Payer: Self-pay | Admitting: Internal Medicine

## 2018-02-01 NOTE — Progress Notes (Signed)
   Subjective:    Patient ID: [name] [name], female    DOB: [birth year], 82 y.o.   MRN: [id number]  HPI The patient is an 82 YO female coming in for left low back pain which radiates into her left leg. She had this same pain several months ago. This went away all the way with treatment with prednisone. She was able to return to normal function. Denies additional injury or problem recently. No falls. She did take ibuprofen which did not help the pain. She has 5-6/10 pain when bad. Worse with walking or standing for some time. Resting is mildly better. Denies collapse of leg. No change in bowel or bladder habits. Started about 1 day ago.   Review of Systems  Constitutional: Positive for activity change. Negative for appetite change, chills, fatigue, fever and unexpected weight change.  Respiratory: Negative.   Cardiovascular: Negative.   Gastrointestinal: Negative.   Musculoskeletal: Positive for arthralgias, back pain and myalgias. Negative for gait problem and joint swelling.  Skin: Negative.   Neurological: Negative.       Objective:   Physical Exam  Constitutional: She is oriented to person, place, and time. She appears well-developed and well-nourished.  HENT:  Head: Normocephalic and atraumatic.  Eyes: EOM are normal.  Neck: Normal range of motion.  Cardiovascular: Normal rate and regular rhythm.  Pulmonary/Chest: Effort normal and breath sounds normal. No respiratory distress. She has no wheezes. She has no rales.  Abdominal: There is no tenderness.  Musculoskeletal: She exhibits tenderness. She exhibits no edema.  Left buttock and SI joint with pain with radiates with palpation.   Neurological: She is alert and oriented to person, place, and time. Coordination normal.  Skin: Skin is warm and dry.   Vitals:   01/31/18 1458  BP: 128/78  Pulse: 91  Temp: 98.1 F (36.7 C)  TempSrc: Oral  SpO2: 98%  Weight: 108 lb (49 kg)  Height: 5' 3.75" (1.619 m)      Assessment &  Plan:

## 2018-02-01 NOTE — Assessment & Plan Note (Signed)
Recurrent and will try prednisone burst again. If recurrent again needs to go to PT to work on long term prevention. Can take tylenol or ibuprofen otc for pain and try heating pad.

## 2018-02-05 ENCOUNTER — Telehealth: Payer: Self-pay | Admitting: Internal Medicine

## 2018-02-05 DIAGNOSIS — M5442 Lumbago with sciatica, left side: Secondary | ICD-10-CM

## 2018-02-05 NOTE — Telephone Encounter (Signed)
Patient informed referral placed.

## 2018-02-05 NOTE — Telephone Encounter (Signed)
Copied from [place] [id number]. Topic: General - Other >> Feb 05, 2018 10:57 AM [name] [name] wrote: Reason for CRM: Patient checking back in with [facility] after finishing prednisone which has helped the left hip sciatic nerve and is ready to see physical therapy. Please contact patient once referral to pt in [place] has been placed.

## 2018-02-05 NOTE — Telephone Encounter (Signed)
PT placed.

## 2018-02-07 DIAGNOSIS — J301 Allergic rhinitis due to pollen: Secondary | ICD-10-CM | POA: Diagnosis not present

## 2018-02-07 DIAGNOSIS — J3089 Other allergic rhinitis: Secondary | ICD-10-CM | POA: Diagnosis not present

## 2018-02-13 ENCOUNTER — Other Ambulatory Visit: Payer: Self-pay

## 2018-02-13 ENCOUNTER — Ambulatory Visit: Payer: Medicare Other | Attending: Internal Medicine | Admitting: Physical Therapy

## 2018-02-13 ENCOUNTER — Encounter: Payer: Self-pay | Admitting: Physical Therapy

## 2018-02-13 DIAGNOSIS — M6281 Muscle weakness (generalized): Secondary | ICD-10-CM | POA: Diagnosis not present

## 2018-02-13 DIAGNOSIS — M79605 Pain in left leg: Secondary | ICD-10-CM | POA: Insufficient documentation

## 2018-02-13 NOTE — Therapy (Signed)
[place], [place], [address] Phone: [phone]   Fax:  [phone]  Physical Therapy Evaluation  Patient Details  Name: [name] [name] MRN: [id number] Date of Birth: 1936-01-06 Referring Provider (PT): Hoyt Koch, MD   Encounter Date: 02/13/2018  PT End of Session - 02/13/18 1050    Visit Number  1    Number of Visits  5    Date for PT Re-Evaluation  03/15/18    Authorization Type  MCR    PT Start Time  1050    PT Stop Time  1132    PT Time Calculation (min)  42 min    Activity Tolerance  Patient tolerated treatment well    Behavior During Therapy  [facility] for tasks assessed/performed       Past Medical History:  Diagnosis Date  . Anxiety   . Coronary artery spasm (HCC)    Non obstructive CAD  . Diverticulosis of colon   . Fibromyalgia   . Hx of colonic polyps   . Lumbar back pain   . MVP (mitral valve prolapse)    No antibiotics required for procedures  . Osteoporosis   . Palpitations   . Pneumonia   . Shingles     Past Surgical History:  Procedure Laterality Date  . CHOLECYSTECTOMY  03/2003  . LUMBAR LAMINECTOMY    . TONSILLECTOMY     as a child    There were no vitals filed for this visit.   Subjective Assessment - 02/13/18 1059    Subjective  First attack end of August and just woke up one night due to pain. Was going down Lt leg. Cont to worsen and had to hold furniture to walk, saw MD that day. Took a 5 day dose of prednisone. Was caughing with upper respiratory illness, saw Dr Sharlet Salina again for leg pain. Did another 5day dose of prednisone. Try to go to gym 2/week, less lately due to illness but walks regularly. right now is sore but not going down leg.     How long can you stand comfortably?  not limited    Patient Stated Goals  decrease pain- reduce future episodes    Currently in Pain?  No/denies         [facility] PT Assessment - 02/13/18 0001      Assessment    Medical Diagnosis  acute Lt LBP    Referring Provider (PT)  Hoyt Koch, MD    Onset Date/Surgical Date  --   august   Hand Dominance  Right      Precautions   Precautions  None      Restrictions   Weight Bearing Restrictions  No      Balance Screen   Has the patient fallen in the past 6 months  No      [place] residence    Living Arrangements  Alone      Prior Function   Level of Independence  Independent      Cognition   Overall Cognitive Status  Within Functional Limits for tasks assessed      Observation/Other Assessments   Focus on Therapeutic Outcomes (FOTO)   47% limited      Sensation   Additional Comments  [facility]      Posture/Postural Control   Posture Comments  WFL      ROM / Strength   AROM / PROM / Strength  Strength  Strength   Strength Assessment Site  Hip    Right/Left Hip  Right;Left    Right Hip ABduction  5/5    Left Hip ABduction  4/5      Flexibility   Soft Tissue Assessment /Muscle Length  yes    Hamstrings  limited Lt    Piriformis  limited Lt      Palpation   Palpation comment  TTP Lt SIJ & gr trochanter                Objective measurements completed on examination: See above findings.      [facility] PT Treatment/Exercise - 02/13/18 0001      Exercises   Exercises  Knee/Hip      Knee/Hip Exercises: Stretches   Passive Hamstring Stretch Limitations  seated & supine    Hip Flexor Stretch Limitations  modified thomas stretch    Piriformis Stretch Limitations  seated & supine             PT Education - 02/13/18 1055    Education Details  anatomy of condition, POC, HEP, exercise form/rationale    Person(s) Educated  Patient    Methods  Explanation;Demonstration;Tactile cues;Verbal cues;Handout    Comprehension  Verbalized understanding;Returned demonstration;Verbal cues required;Tactile cues required;Need further instruction          PT Long Term  Goals - 02/13/18 1159      PT LONG TERM GOAL #1   Title  pt will be independent in long term stretching & exercise program    Baseline  will progress and establish as appropriate    Time  4    Period  Weeks    Status  New    Target Date  03/15/18      PT LONG TERM GOAL #2   Title  FOTO to 29% limitation    Baseline  47% limited at eval    Time  4    Period  Weeks    Status  New    Target Date  03/15/18      PT LONG TERM GOAL #3   Title  bil hip abd to 5/5    Baseline  see flowsheet    Time  4    Period  Weeks    Status  New    Target Date  03/15/18             Plan - 02/13/18 1146    Clinical Impression Statement  Pt presents to PT with complaints of Lt leg pain of insidious onset that has decreased after medications but would like to learn proper stretching and exercise to reduce return risk. Pt is active and does appropriate exercises but does not stretch regularly. Significant limitation in LLE vs Rt noted today. Created stretching program which pt was able to perform appropriately. Will continue for 1/week for 4 weeks to establish proper long term program to avoid return of pain.     History and Personal Factors relevant to plan of care:  anxiety, h/o LBP with radicular symptoms, fibromyalgia, osteoporosis    Clinical Presentation  Stable    Clinical Decision Making  Low    Rehab Potential  Good    PT Frequency  1x / week    PT Duration  4 weeks    PT Treatment/Interventions  ADLs/Self Care Home Management;Cryotherapy;Therapeutic activities;Therapeutic exercise;Manual techniques;Patient/family education;Passive range of motion    PT Next Visit Plan  review stretches, gym machines, floor exercises for home- review and  alter PRN    PT Home Exercise Plan  stretches: HS, piriformis, hip flexors    Consulted and Agree with Plan of Care  Patient       Patient will benefit from skilled therapeutic intervention in order to improve the following deficits and impairments:   Increased muscle spasms, Pain, Impaired flexibility, Decreased strength  Visit Diagnosis: Pain in left leg - Plan: PT plan of care cert/re-cert  Muscle weakness (generalized) - Plan: PT plan of care cert/re-cert     Problem List Patient Active Problem List   Diagnosis Date Noted  . Acute left-sided low back pain with left-sided sciatica 12/25/2017  . Asthmatic bronchitis 07/24/2016  . Laryngopharyngeal reflux (LPR) 07/24/2016  . Routine general medical examination at a health care facility 08/11/2014  . Osteopenia 07/23/2013  . Insomnia 01/27/2013  . Vaginal atrophy 07/22/2012  . MVP (mitral valve prolapse)   . Allergic rhinitis 02/28/2011  . Irritable bowel syndrome 11/17/2007  . Essential hypertension 05/21/2007  . Coronary atherosclerosis 05/21/2007  . Anxiety state 05/20/2007  . Fibromyalgia 05/20/2007    Ambra Haverstick C. Daleyssa Loiselle PT, DPT 02/13/18 12:07 PM   [facility] [address] [place], [place], [address] Phone: [phone]   Fax:  [phone]  Name: [name] [name] MRN: [id number] Date of Birth: 1936/03/16

## 2018-02-14 DIAGNOSIS — J3089 Other allergic rhinitis: Secondary | ICD-10-CM | POA: Diagnosis not present

## 2018-02-14 DIAGNOSIS — J301 Allergic rhinitis due to pollen: Secondary | ICD-10-CM | POA: Diagnosis not present

## 2018-02-18 ENCOUNTER — Encounter: Payer: Self-pay | Admitting: Physical Therapy

## 2018-02-18 ENCOUNTER — Ambulatory Visit: Payer: Medicare Other | Admitting: Physical Therapy

## 2018-02-18 DIAGNOSIS — M6281 Muscle weakness (generalized): Secondary | ICD-10-CM

## 2018-02-18 DIAGNOSIS — M79605 Pain in left leg: Secondary | ICD-10-CM | POA: Diagnosis not present

## 2018-02-18 NOTE — Therapy (Signed)
[place], [place], [address] Phone: [phone]   Fax:  [phone]  Physical Therapy Treatment  Patient Details  Name: [name] [name] MRN: [id number] Date of Birth: Nov 04, 1935 Referring Provider (PT): Hoyt Koch, MD   Encounter Date: 02/18/2018  PT End of Session - 02/18/18 1542    Visit Number  2    Number of Visits  5    Date for PT Re-Evaluation  03/15/18    Authorization Type  MCR    PT Start Time  8588    PT Stop Time  1632    PT Time Calculation (min)  45 min    Activity Tolerance  Patient tolerated treatment well    Behavior During Therapy  [facility] for tasks assessed/performed       Past Medical History:  Diagnosis Date  . Anxiety   . Coronary artery spasm (HCC)    Non obstructive CAD  . Diverticulosis of colon   . Fibromyalgia   . Hx of colonic polyps   . Lumbar back pain   . MVP (mitral valve prolapse)    No antibiotics required for procedures  . Osteoporosis   . Palpitations   . Pneumonia   . Shingles     Past Surgical History:  Procedure Laterality Date  . CHOLECYSTECTOMY  03/2003  . LUMBAR LAMINECTOMY    . TONSILLECTOMY     as a child    There were no vitals filed for this visit.  Subjective Assessment - 02/18/18 1547    Subjective  Some twitchy pains noted in Rt buttock in superior aspect    Patient Stated Goals  decrease pain- reduce future episodes    Currently in Pain?  No/denies                       [facility] Adult PT Treatment/Exercise - 02/18/18 0001      Exercises   Exercises  Knee/Hip      Knee/Hip Exercises: Stretches   Passive Hamstring Stretch Limitations  seated & supine    Hip Flexor Stretch Limitations  modified thomas stretch    Piriformis Stretch Limitations  seated & supine      Knee/Hip Exercises: Aerobic   Stationary Bike  5 min L2   decreased from L6 over time     Knee/Hip Exercises: Supine   Bridges with [employer]  Strengthening;10 reps    Straight Leg Raises Limitations  with opp UE reach    Other Supine Knee/Hip Exercises  crunches, ab set      Knee/Hip Exercises: Sidelying   Clams  cues for form      Knee/Hip Exercises: Prone   Hip Extension Limitations  glut set + hip ext    Other Prone Exercises  glut set                  PT Long Term Goals - 02/13/18 1159      PT LONG TERM GOAL #1   Title  pt will be independent in long term stretching & exercise program    Baseline  will progress and establish as appropriate    Time  4    Period  Weeks    Status  New    Target Date  03/15/18      PT LONG TERM GOAL #2   Title  FOTO to 29% limitation    Baseline  47% limited at eval  Time  4    Period  Weeks    Status  New    Target Date  03/15/18      PT LONG TERM GOAL #3   Title  bil hip abd to 5/5    Baseline  see flowsheet    Time  4    Period  Weeks    Status  New    Target Date  03/15/18            Plan - 02/18/18 1636    Clinical Impression Statement  Reviewed and altered floor exercises at home to target muscle groups. Pt was able to perform these exercises with good form.     PT Treatment/Interventions  ADLs/Self Care Home Management;Cryotherapy;Therapeutic activities;Therapeutic exercise;Manual techniques;Patient/family education;Passive range of motion    PT Next Visit Plan  gym machinery, try eliptical    PT Home Exercise Plan  stretches: HS, piriformis, hip flexors, ab set, bridge, clam, crunch, SLR with opp UE reach, prone glut sets & hip ext    Consulted and Agree with Plan of Care  Patient       Patient will benefit from skilled therapeutic intervention in order to improve the following deficits and impairments:  Increased muscle spasms, Pain, Impaired flexibility, Decreased strength  Visit Diagnosis: Pain in left leg  Muscle weakness (generalized)     Problem List Patient Active Problem List   Diagnosis Date Noted  . Acute left-sided  low back pain with left-sided sciatica 12/25/2017  . Asthmatic bronchitis 07/24/2016  . Laryngopharyngeal reflux (LPR) 07/24/2016  . Routine general medical examination at a health care facility 08/11/2014  . Osteopenia 07/23/2013  . Insomnia 01/27/2013  . Vaginal atrophy 07/22/2012  . MVP (mitral valve prolapse)   . Allergic rhinitis 02/28/2011  . Irritable bowel syndrome 11/17/2007  . Essential hypertension 05/21/2007  . Coronary atherosclerosis 05/21/2007  . Anxiety state 05/20/2007  . Fibromyalgia 05/20/2007    Welda Azzarello C. Joy Reiger PT, DPT 02/18/18 4:40 PM   [facility] [address] [place], [place], [address] Phone: [phone]   Fax:  [phone]  Name: [name] [name] MRN: [id number] Date of Birth: Sep 07, 1935

## 2018-02-20 DIAGNOSIS — J301 Allergic rhinitis due to pollen: Secondary | ICD-10-CM | POA: Diagnosis not present

## 2018-02-20 DIAGNOSIS — J3089 Other allergic rhinitis: Secondary | ICD-10-CM | POA: Diagnosis not present

## 2018-02-25 ENCOUNTER — Encounter: Payer: Self-pay | Admitting: Physical Therapy

## 2018-02-25 ENCOUNTER — Ambulatory Visit: Payer: Medicare Other | Admitting: Physical Therapy

## 2018-02-25 DIAGNOSIS — M79605 Pain in left leg: Secondary | ICD-10-CM

## 2018-02-25 DIAGNOSIS — M6281 Muscle weakness (generalized): Secondary | ICD-10-CM

## 2018-02-25 NOTE — Therapy (Signed)
[place], [place], [address] Phone: [phone]   Fax:  [phone]  Physical Therapy Treatment  Patient Details  Name: [name] [name] MRN: [id number] Date of Birth: 06-03-1935 Referring Provider (PT): Hoyt Koch, MD   Encounter Date: 02/25/2018  PT End of Session - 02/25/18 1342    Visit Number  3    Number of Visits  5    Date for PT Re-Evaluation  03/15/18    Authorization Type  MCR    PT Start Time  1340    PT Stop Time  1415    PT Time Calculation (min)  35 min    Activity Tolerance  Patient tolerated treatment well    Behavior During Therapy  [facility] for tasks assessed/performed       Past Medical History:  Diagnosis Date  . Anxiety   . Coronary artery spasm (HCC)    Non obstructive CAD  . Diverticulosis of colon   . Fibromyalgia   . Hx of colonic polyps   . Lumbar back pain   . MVP (mitral valve prolapse)    No antibiotics required for procedures  . Osteoporosis   . Palpitations   . Pneumonia   . Shingles     Past Surgical History:  Procedure Laterality Date  . CHOLECYSTECTOMY  03/2003  . LUMBAR LAMINECTOMY    . TONSILLECTOMY     as a child    There were no vitals filed for this visit.  Subjective Assessment - 02/25/18 1342    Subjective  doing modified home program. Was sore after bike. bil glut soreness after house work    Patient Stated Goals  decrease pain- reduce future episodes    Currently in Pain?  No/denies                       [facility] Adult PT Treatment/Exercise - 02/25/18 0001      Exercises   Exercises  Knee/Hip      Knee/Hip Exercises: Aerobic   Stationary Bike  5 min L1    Elliptical  attmepted for a minute      Knee/Hip Exercises: Machines for Strengthening   Cybex Knee Extension  10 lb    Cybex Knee Flexion  25lb    Total Gym Leg Press  omega leg press 25lb    Other Machine  simulated abd/add machine & row machine                   PT Long Term Goals - 02/13/18 1159      PT LONG TERM GOAL #1   Title  pt will be independent in long term stretching & exercise program    Baseline  will progress and establish as appropriate    Time  4    Period  Weeks    Status  New    Target Date  03/15/18      PT LONG TERM GOAL #2   Title  FOTO to 29% limitation    Baseline  47% limited at eval    Time  4    Period  Weeks    Status  New    Target Date  03/15/18      PT LONG TERM GOAL #3   Title  bil hip abd to 5/5    Baseline  see flowsheet    Time  4    Period  Weeks    Status  New    Target Date  03/15/18            Plan - 02/25/18 1704    Clinical Impression Statement  Educated on and practiced with use of gym machinery for proper form at gym. Pt tolerated exercises well and was able to verbalize use of proper muscle groups. Pt would like to review full floor exercise program at next visit.     PT Treatment/Interventions  ADLs/Self Care Home Management;Cryotherapy;Therapeutic activities;Therapeutic exercise;Manual techniques;Patient/family education;Passive range of motion    PT Next Visit Plan  review floor exercises    PT Home Exercise Plan  stretches: HS, piriformis, hip flexors, ab set, bridge, clam, crunch, SLR with opp UE reach, prone glut sets & hip ext    Consulted and Agree with Plan of Care  Patient       Patient will benefit from skilled therapeutic intervention in order to improve the following deficits and impairments:  Increased muscle spasms, Pain, Impaired flexibility, Decreased strength  Visit Diagnosis: Pain in left leg  Muscle weakness (generalized)     Problem List Patient Active Problem List   Diagnosis Date Noted  . Acute left-sided low back pain with left-sided sciatica 12/25/2017  . Asthmatic bronchitis 07/24/2016  . Laryngopharyngeal reflux (LPR) 07/24/2016  . Routine general medical examination at a health care facility 08/11/2014  . Osteopenia  07/23/2013  . Insomnia 01/27/2013  . Vaginal atrophy 07/22/2012  . MVP (mitral valve prolapse)   . Allergic rhinitis 02/28/2011  . Irritable bowel syndrome 11/17/2007  . Essential hypertension 05/21/2007  . Coronary atherosclerosis 05/21/2007  . Anxiety state 05/20/2007  . Fibromyalgia 05/20/2007    Bernardine Langworthy C. Jearline Hirschhorn PT, DPT 02/25/18 5:07 PM   [facility] [address] [place], [place], [address] Phone: [phone]   Fax:  [phone]  Name: [name] [name] MRN: [id number] Date of Birth: 1935-09-24

## 2018-02-28 DIAGNOSIS — J301 Allergic rhinitis due to pollen: Secondary | ICD-10-CM | POA: Diagnosis not present

## 2018-02-28 DIAGNOSIS — J3089 Other allergic rhinitis: Secondary | ICD-10-CM | POA: Diagnosis not present

## 2018-03-04 ENCOUNTER — Encounter: Payer: Self-pay | Admitting: Physical Therapy

## 2018-03-04 ENCOUNTER — Ambulatory Visit: Payer: Medicare Other | Attending: Internal Medicine | Admitting: Physical Therapy

## 2018-03-04 DIAGNOSIS — M6281 Muscle weakness (generalized): Secondary | ICD-10-CM | POA: Insufficient documentation

## 2018-03-04 DIAGNOSIS — M79605 Pain in left leg: Secondary | ICD-10-CM | POA: Diagnosis not present

## 2018-03-04 NOTE — Therapy (Signed)
[place], [place], [address] Phone: [phone]   Fax:  [phone]  Physical Therapy Treatment/Discharge  Patient Details  Name: [name] [name] MRN: [id number] Date of Birth: 06/06/1935 Referring Provider (PT): Hoyt Koch, MD   Encounter Date: 03/04/2018  PT End of Session - 03/04/18 1017    Visit Number  4    Number of Visits  5    Date for PT Re-Evaluation  03/15/18    Authorization Type  MCR    PT Start Time  1017    PT Stop Time  1058    PT Time Calculation (min)  41 min    Activity Tolerance  Patient tolerated treatment well    Behavior During Therapy  [facility] for tasks assessed/performed       Past Medical History:  Diagnosis Date  . Anxiety   . Coronary artery spasm (HCC)    Non obstructive CAD  . Diverticulosis of colon   . Fibromyalgia   . Hx of colonic polyps   . Lumbar back pain   . MVP (mitral valve prolapse)    No antibiotics required for procedures  . Osteoporosis   . Palpitations   . Pneumonia   . Shingles     Past Surgical History:  Procedure Laterality Date  . CHOLECYSTECTOMY  03/2003  . LUMBAR LAMINECTOMY    . TONSILLECTOMY     as a child    There were no vitals filed for this visit.  Subjective Assessment - 03/04/18 1017    Subjective  That spot in my Rt hip is not as sore    Currently in Pain?  No/denies         [facility] PT Assessment - 03/04/18 0001      Assessment   Medical Diagnosis  acute Lt LBP    Referring Provider (PT)  Hoyt Koch, MD    Onset Date/Surgical Date  --   August     Strength   Right Hip ABduction  5/5    Left Hip ABduction  5/5      Palpation   Palpation comment  denies TTP at this time but does have catch occasionally at mid Lt buttock                   [facility] Adult PT Treatment/Exercise - 03/04/18 0001      Exercises   Exercises  Knee/Hip      Knee/Hip Exercises: Stretches   Passive  Hamstring Stretch Limitations  seated EOB    Hip Flexor Stretch Limitations  modified thomas stretch    Other Knee/Hip Stretches  seated hip ER stretch      Knee/Hip Exercises: Aerobic   Stationary Bike  5 min L2      Knee/Hip Exercises: Supine   Bridges with Ball Squeeze  Both;Strengthening    Straight Leg Raises Limitations  with opp UE reach    Other Supine Knee/Hip Exercises  ab set, crunches      Knee/Hip Exercises: Sidelying   Clams  x20 each      Knee/Hip Exercises: Prone   Hip Extension Limitations  glut set + hip ext    Other Prone Exercises  glut set      Manual Therapy   Manual therapy comments  edu on use of tennis ball for self STM             PT Education - 03/04/18 1101    Education Details  gym program, goals discussion, tennis ball STM    Person(s) Educated  Patient    Methods  Explanation;Verbal cues;Tactile cues;Demonstration    Comprehension  Verbalized understanding;Returned demonstration          PT Long Term Goals - 03/04/18 1052      PT LONG TERM GOAL #1   Title  pt will be independent in long term stretching & exercise program    Status  Achieved      PT LONG TERM GOAL #2   Title  FOTO to 29% limitation    Baseline  33% limitation, improved from 47%    Status  Not Met      PT LONG TERM GOAL #3   Title  bil hip abd to 5/5    Status  Achieved            Plan - 03/04/18 1059    Clinical Impression Statement  Reviewed floor exercises and stretches for form today and pt was able to demo with proper muscle activation. Pt is now ready to transition to independent home/gym program and was educated on importance of gradual increase in challenges. Pt was encouraged to contact us with any further questions.     PT Treatment/Interventions  ADLs/Self Care Home Management;Cryotherapy;Therapeutic activities;Therapeutic exercise;Manual techniques;Patient/family education;Passive range of motion    PT Home Exercise Plan  stretches: HS,  piriformis, hip flexors, ab set, bridge, clam, crunch, SLR with opp UE reach, prone glut sets & hip ext    Consulted and Agree with Plan of Care  Patient       Patient will benefit from skilled therapeutic intervention in order to improve the following deficits and impairments:  Increased muscle spasms, Pain, Impaired flexibility, Decreased strength  Visit Diagnosis: Pain in left leg  Muscle weakness (generalized)     Problem List Patient Active Problem List   Diagnosis Date Noted  . Acute left-sided low back pain with left-sided sciatica 12/25/2017  . Asthmatic bronchitis 07/24/2016  . Laryngopharyngeal reflux (LPR) 07/24/2016  . Routine general medical examination at a health care facility 08/11/2014  . Osteopenia 07/23/2013  . Insomnia 01/27/2013  . Vaginal atrophy 07/22/2012  . MVP (mitral valve prolapse)   . Allergic rhinitis 02/28/2011  . Irritable bowel syndrome 11/17/2007  . Essential hypertension 05/21/2007  . Coronary atherosclerosis 05/21/2007  . Anxiety state 05/20/2007  . Fibromyalgia 05/20/2007   PHYSICAL THERAPY DISCHARGE SUMMARY  Visits from Start of Care: 4  Current functional level related to goals / functional outcomes: See above   Remaining deficits: See above   Education / Equipment: Anatomy of condition, POC, HEP, exercise form/rationale  Plan: Patient agrees to discharge.  Patient goals were partially met. Patient is being discharged due to being pleased with the current functional level.  ?????     Shenicka Sunderlin C. Kerryn Tennant PT, DPT 03/04/18 11:02 AM   [facility] [address] [place], [place], [address] Phone: [phone]   Fax:  [phone]  Name: [name] [name] MRN: [id number] Date of Birth: 1935-06-14

## 2018-03-07 DIAGNOSIS — J301 Allergic rhinitis due to pollen: Secondary | ICD-10-CM | POA: Diagnosis not present

## 2018-03-07 DIAGNOSIS — J3089 Other allergic rhinitis: Secondary | ICD-10-CM | POA: Diagnosis not present

## 2018-03-08 DIAGNOSIS — J301 Allergic rhinitis due to pollen: Secondary | ICD-10-CM | POA: Diagnosis not present

## 2018-03-08 DIAGNOSIS — J3089 Other allergic rhinitis: Secondary | ICD-10-CM | POA: Diagnosis not present

## 2018-03-11 ENCOUNTER — Encounter: Payer: Medicare Other | Admitting: Physical Therapy

## 2018-03-12 ENCOUNTER — Ambulatory Visit: Payer: Medicare Other | Admitting: Physical Therapy

## 2018-03-14 DIAGNOSIS — J3089 Other allergic rhinitis: Secondary | ICD-10-CM | POA: Diagnosis not present

## 2018-03-14 DIAGNOSIS — J301 Allergic rhinitis due to pollen: Secondary | ICD-10-CM | POA: Diagnosis not present

## 2018-03-18 DIAGNOSIS — H5203 Hypermetropia, bilateral: Secondary | ICD-10-CM | POA: Diagnosis not present

## 2018-03-18 DIAGNOSIS — H2513 Age-related nuclear cataract, bilateral: Secondary | ICD-10-CM | POA: Diagnosis not present

## 2018-03-21 DIAGNOSIS — J3089 Other allergic rhinitis: Secondary | ICD-10-CM | POA: Diagnosis not present

## 2018-03-21 DIAGNOSIS — J301 Allergic rhinitis due to pollen: Secondary | ICD-10-CM | POA: Diagnosis not present

## 2018-03-27 DIAGNOSIS — J301 Allergic rhinitis due to pollen: Secondary | ICD-10-CM | POA: Diagnosis not present

## 2018-03-27 DIAGNOSIS — J3089 Other allergic rhinitis: Secondary | ICD-10-CM | POA: Diagnosis not present

## 2018-04-02 ENCOUNTER — Encounter: Payer: Self-pay | Admitting: Internal Medicine

## 2018-04-02 ENCOUNTER — Ambulatory Visit (INDEPENDENT_AMBULATORY_CARE_PROVIDER_SITE_OTHER)
Admission: RE | Admit: 2018-04-02 | Discharge: 2018-04-02 | Disposition: A | Discharge status: Home / Self Care | Payer: Medicare Other | Source: Ambulatory Visit | Attending: Internal Medicine | Admitting: Internal Medicine

## 2018-04-02 ENCOUNTER — Ambulatory Visit (INDEPENDENT_AMBULATORY_CARE_PROVIDER_SITE_OTHER): Payer: Medicare Other | Admitting: Internal Medicine

## 2018-04-02 VITALS — BP 150/80 | HR 93 | Temp 97.7°F | Ht 63.75 in | Wt 109.0 lb

## 2018-04-02 DIAGNOSIS — M5441 Lumbago with sciatica, right side: Secondary | ICD-10-CM | POA: Diagnosis not present

## 2018-04-02 DIAGNOSIS — M545 Low back pain: Secondary | ICD-10-CM | POA: Diagnosis not present

## 2018-04-02 MED ORDER — PREDNISONE 20 MG PO TABS: 40.0000 mg | ORAL_TABLET | Freq: Every day | ORAL | 0 refills | Status: DC

## 2018-04-02 NOTE — Assessment & Plan Note (Signed)
Rx for prednisone and lumbar x-ray today to look for changes. Offered neurosurgery referral and she will think about it.

## 2018-04-02 NOTE — Progress Notes (Signed)
   Subjective:    Patient ID: [name] [name], female    DOB: 1935/12/31, 82 y.o.   MRN: [id number]  HPI The patient is an 82 YO female coming in for low back pain. Started about 1 week ago. She was not able to go to Thanksgiving due to the pain. She has had low back pain twice since August and has recovered fully with treatment. She just finished prednisone and therapy beginning of November. She is still doing the exercises from PT. She denies recent falls or exertion. Denies fevers or chills. Denies weight change. Has taken muscle relaxer and ibuprofen which is helping slightly. Overall mild improvement over the last week. She does have pain in low back and radiates down the right leg. Previously the pain radiated down the left leg. Denies numbness, weakness, or bowel or bladder changes.   Review of Systems  Constitutional: Positive for activity change. Negative for appetite change, chills, fatigue, fever and unexpected weight change.  Respiratory: Negative.   Cardiovascular: Negative.   Gastrointestinal: Negative.   Musculoskeletal: Positive for arthralgias, back pain and myalgias. Negative for gait problem and joint swelling.  Skin: Negative.   Neurological: Negative.       Objective:   Physical Exam  Constitutional: She is oriented to person, place, and time. She appears well-developed and well-nourished.  HENT:  Head: Normocephalic and atraumatic.  Eyes: EOM are normal.  Neck: Normal range of motion.  Cardiovascular: Normal rate and regular rhythm.  Pulmonary/Chest: Effort normal and breath sounds normal. No respiratory distress. She has no wheezes. She has no rales.  Abdominal: Soft. Bowel sounds are normal. She exhibits no distension. There is no tenderness. There is no rebound.  Musculoskeletal: She exhibits tenderness. She exhibits no edema.  Pain in the low lumbar midline and right low back pain SI region.   Neurological: She is alert and oriented to person, place, and time.  Coordination normal.  Skin: Skin is warm and dry.   Vitals:   04/02/18 0826  BP: (!) 150/80  Pulse: 93  Temp: 97.7 F (36.5 C)  TempSrc: Oral  SpO2: 97%  Weight: 109 lb (49.4 kg)  Height: 5' 3.75" (1.619 m)      Assessment & Plan:

## 2018-04-02 NOTE — Patient Instructions (Signed)
We have sent in the prednisone to take 2 pills daily for 5 days to help the back and are doing another x-ray today to see if there are any changes.

## 2018-04-04 DIAGNOSIS — J301 Allergic rhinitis due to pollen: Secondary | ICD-10-CM | POA: Diagnosis not present

## 2018-04-04 DIAGNOSIS — J3089 Other allergic rhinitis: Secondary | ICD-10-CM | POA: Diagnosis not present

## 2018-04-11 DIAGNOSIS — J301 Allergic rhinitis due to pollen: Secondary | ICD-10-CM | POA: Diagnosis not present

## 2018-04-11 DIAGNOSIS — J3089 Other allergic rhinitis: Secondary | ICD-10-CM | POA: Diagnosis not present

## 2018-04-18 DIAGNOSIS — J301 Allergic rhinitis due to pollen: Secondary | ICD-10-CM | POA: Diagnosis not present

## 2018-04-18 DIAGNOSIS — J3089 Other allergic rhinitis: Secondary | ICD-10-CM | POA: Diagnosis not present

## 2018-04-26 DIAGNOSIS — J301 Allergic rhinitis due to pollen: Secondary | ICD-10-CM | POA: Diagnosis not present

## 2018-04-26 DIAGNOSIS — J3089 Other allergic rhinitis: Secondary | ICD-10-CM | POA: Diagnosis not present

## 2018-04-30 DIAGNOSIS — L710 Perioral dermatitis: Secondary | ICD-10-CM | POA: Diagnosis not present

## 2018-05-01 HISTORY — PX: CATARACT EXTRACTION, BILATERAL: SHX1313

## 2018-05-02 DIAGNOSIS — J3089 Other allergic rhinitis: Secondary | ICD-10-CM | POA: Diagnosis not present

## 2018-05-02 DIAGNOSIS — J301 Allergic rhinitis due to pollen: Secondary | ICD-10-CM | POA: Diagnosis not present

## 2018-05-09 DIAGNOSIS — J301 Allergic rhinitis due to pollen: Secondary | ICD-10-CM | POA: Diagnosis not present

## 2018-05-09 DIAGNOSIS — J3089 Other allergic rhinitis: Secondary | ICD-10-CM | POA: Diagnosis not present

## 2018-05-14 DIAGNOSIS — H25811 Combined forms of age-related cataract, right eye: Secondary | ICD-10-CM | POA: Diagnosis not present

## 2018-05-14 DIAGNOSIS — H2511 Age-related nuclear cataract, right eye: Secondary | ICD-10-CM | POA: Diagnosis not present

## 2018-05-16 DIAGNOSIS — J3089 Other allergic rhinitis: Secondary | ICD-10-CM | POA: Diagnosis not present

## 2018-05-16 DIAGNOSIS — J301 Allergic rhinitis due to pollen: Secondary | ICD-10-CM | POA: Diagnosis not present

## 2018-05-19 ENCOUNTER — Other Ambulatory Visit: Payer: Self-pay | Admitting: Internal Medicine

## 2018-05-20 NOTE — Telephone Encounter (Signed)
Control database checked last refill: 04/17/2018  LOV: acute 04/02/2018, CPE 08/21/2017 [id number]

## 2018-05-23 DIAGNOSIS — J301 Allergic rhinitis due to pollen: Secondary | ICD-10-CM | POA: Diagnosis not present

## 2018-05-23 DIAGNOSIS — J3089 Other allergic rhinitis: Secondary | ICD-10-CM | POA: Diagnosis not present

## 2018-05-30 DIAGNOSIS — J301 Allergic rhinitis due to pollen: Secondary | ICD-10-CM | POA: Diagnosis not present

## 2018-05-30 DIAGNOSIS — J3089 Other allergic rhinitis: Secondary | ICD-10-CM | POA: Diagnosis not present

## 2018-06-06 DIAGNOSIS — J3089 Other allergic rhinitis: Secondary | ICD-10-CM | POA: Diagnosis not present

## 2018-06-06 DIAGNOSIS — J301 Allergic rhinitis due to pollen: Secondary | ICD-10-CM | POA: Diagnosis not present

## 2018-06-10 ENCOUNTER — Encounter: Payer: Self-pay | Admitting: Obstetrics & Gynecology

## 2018-06-10 DIAGNOSIS — N6310 Unspecified lump in the right breast, unspecified quadrant: Secondary | ICD-10-CM | POA: Diagnosis not present

## 2018-06-10 DIAGNOSIS — R928 Other abnormal and inconclusive findings on diagnostic imaging of breast: Secondary | ICD-10-CM | POA: Diagnosis not present

## 2018-06-11 DIAGNOSIS — H25812 Combined forms of age-related cataract, left eye: Secondary | ICD-10-CM | POA: Diagnosis not present

## 2018-06-11 DIAGNOSIS — H2512 Age-related nuclear cataract, left eye: Secondary | ICD-10-CM | POA: Diagnosis not present

## 2018-06-13 DIAGNOSIS — J301 Allergic rhinitis due to pollen: Secondary | ICD-10-CM | POA: Diagnosis not present

## 2018-06-13 DIAGNOSIS — J3089 Other allergic rhinitis: Secondary | ICD-10-CM | POA: Diagnosis not present

## 2018-06-17 DIAGNOSIS — J301 Allergic rhinitis due to pollen: Secondary | ICD-10-CM | POA: Diagnosis not present

## 2018-06-17 DIAGNOSIS — H1045 Other chronic allergic conjunctivitis: Secondary | ICD-10-CM | POA: Diagnosis not present

## 2018-06-17 DIAGNOSIS — J3081 Allergic rhinitis due to animal (cat) (dog) hair and dander: Secondary | ICD-10-CM | POA: Diagnosis not present

## 2018-06-17 DIAGNOSIS — J3089 Other allergic rhinitis: Secondary | ICD-10-CM | POA: Diagnosis not present

## 2018-06-18 DIAGNOSIS — L821 Other seborrheic keratosis: Secondary | ICD-10-CM | POA: Diagnosis not present

## 2018-06-18 DIAGNOSIS — D225 Melanocytic nevi of trunk: Secondary | ICD-10-CM | POA: Diagnosis not present

## 2018-06-18 DIAGNOSIS — Z85828 Personal history of other malignant neoplasm of skin: Secondary | ICD-10-CM | POA: Diagnosis not present

## 2018-06-20 DIAGNOSIS — J301 Allergic rhinitis due to pollen: Secondary | ICD-10-CM | POA: Diagnosis not present

## 2018-06-20 DIAGNOSIS — J3089 Other allergic rhinitis: Secondary | ICD-10-CM | POA: Diagnosis not present

## 2018-06-27 DIAGNOSIS — J3089 Other allergic rhinitis: Secondary | ICD-10-CM | POA: Diagnosis not present

## 2018-06-27 DIAGNOSIS — J301 Allergic rhinitis due to pollen: Secondary | ICD-10-CM | POA: Diagnosis not present

## 2018-07-04 DIAGNOSIS — J301 Allergic rhinitis due to pollen: Secondary | ICD-10-CM | POA: Diagnosis not present

## 2018-07-04 DIAGNOSIS — J3089 Other allergic rhinitis: Secondary | ICD-10-CM | POA: Diagnosis not present

## 2018-07-12 DIAGNOSIS — J301 Allergic rhinitis due to pollen: Secondary | ICD-10-CM | POA: Diagnosis not present

## 2018-07-12 DIAGNOSIS — J3089 Other allergic rhinitis: Secondary | ICD-10-CM | POA: Diagnosis not present

## 2018-07-15 DIAGNOSIS — H2513 Age-related nuclear cataract, bilateral: Secondary | ICD-10-CM | POA: Diagnosis not present

## 2018-07-18 DIAGNOSIS — J3089 Other allergic rhinitis: Secondary | ICD-10-CM | POA: Diagnosis not present

## 2018-07-18 DIAGNOSIS — J301 Allergic rhinitis due to pollen: Secondary | ICD-10-CM | POA: Diagnosis not present

## 2018-07-24 DIAGNOSIS — J301 Allergic rhinitis due to pollen: Secondary | ICD-10-CM | POA: Diagnosis not present

## 2018-07-24 DIAGNOSIS — J3089 Other allergic rhinitis: Secondary | ICD-10-CM | POA: Diagnosis not present

## 2018-07-27 ENCOUNTER — Other Ambulatory Visit: Payer: Self-pay | Admitting: Internal Medicine

## 2018-07-29 DIAGNOSIS — J3089 Other allergic rhinitis: Secondary | ICD-10-CM | POA: Diagnosis not present

## 2018-07-29 DIAGNOSIS — J301 Allergic rhinitis due to pollen: Secondary | ICD-10-CM | POA: Diagnosis not present

## 2018-08-01 DIAGNOSIS — J3089 Other allergic rhinitis: Secondary | ICD-10-CM | POA: Diagnosis not present

## 2018-08-01 DIAGNOSIS — J301 Allergic rhinitis due to pollen: Secondary | ICD-10-CM | POA: Diagnosis not present

## 2018-08-07 DIAGNOSIS — J301 Allergic rhinitis due to pollen: Secondary | ICD-10-CM | POA: Diagnosis not present

## 2018-08-07 DIAGNOSIS — J3089 Other allergic rhinitis: Secondary | ICD-10-CM | POA: Diagnosis not present

## 2018-08-14 DIAGNOSIS — J3089 Other allergic rhinitis: Secondary | ICD-10-CM | POA: Diagnosis not present

## 2018-08-14 DIAGNOSIS — J301 Allergic rhinitis due to pollen: Secondary | ICD-10-CM | POA: Diagnosis not present

## 2018-08-20 DIAGNOSIS — H26493 Other secondary cataract, bilateral: Secondary | ICD-10-CM | POA: Diagnosis not present

## 2018-08-21 DIAGNOSIS — J3089 Other allergic rhinitis: Secondary | ICD-10-CM | POA: Diagnosis not present

## 2018-08-21 DIAGNOSIS — J301 Allergic rhinitis due to pollen: Secondary | ICD-10-CM | POA: Diagnosis not present

## 2018-08-26 DIAGNOSIS — J3089 Other allergic rhinitis: Secondary | ICD-10-CM | POA: Diagnosis not present

## 2018-08-26 DIAGNOSIS — J301 Allergic rhinitis due to pollen: Secondary | ICD-10-CM | POA: Diagnosis not present

## 2018-08-29 DIAGNOSIS — J301 Allergic rhinitis due to pollen: Secondary | ICD-10-CM | POA: Diagnosis not present

## 2018-08-29 DIAGNOSIS — J3089 Other allergic rhinitis: Secondary | ICD-10-CM | POA: Diagnosis not present

## 2018-09-05 DIAGNOSIS — J3089 Other allergic rhinitis: Secondary | ICD-10-CM | POA: Diagnosis not present

## 2018-09-05 DIAGNOSIS — J301 Allergic rhinitis due to pollen: Secondary | ICD-10-CM | POA: Diagnosis not present

## 2018-09-07 ENCOUNTER — Other Ambulatory Visit: Payer: Self-pay | Admitting: Internal Medicine

## 2018-09-09 ENCOUNTER — Ambulatory Visit: Payer: Medicare Other | Admitting: Internal Medicine

## 2018-09-09 NOTE — Telephone Encounter (Signed)
Control database checked last refill: 07/27/2018 LOV: acute 04/02/2018, CPE 08/21/2017 NOV: 10/09/2018

## 2018-09-09 NOTE — Telephone Encounter (Signed)
Can you call patient and see if she can do a virtual to get medication?

## 2018-09-09 NOTE — Telephone Encounter (Signed)
Can she do virtual visit?

## 2018-09-10 NOTE — Telephone Encounter (Signed)
Approved 1 month, schedule follow up visit and AWV for early June

## 2018-09-10 NOTE — Telephone Encounter (Signed)
Patient can do a phone visit.  Please advise.

## 2018-09-11 ENCOUNTER — Encounter: Payer: Medicare Other | Admitting: Obstetrics & Gynecology

## 2018-09-11 DIAGNOSIS — J301 Allergic rhinitis due to pollen: Secondary | ICD-10-CM | POA: Diagnosis not present

## 2018-09-11 DIAGNOSIS — J3089 Other allergic rhinitis: Secondary | ICD-10-CM | POA: Diagnosis not present

## 2018-09-20 DIAGNOSIS — J3089 Other allergic rhinitis: Secondary | ICD-10-CM | POA: Diagnosis not present

## 2018-09-20 DIAGNOSIS — J301 Allergic rhinitis due to pollen: Secondary | ICD-10-CM | POA: Diagnosis not present

## 2018-09-26 DIAGNOSIS — J301 Allergic rhinitis due to pollen: Secondary | ICD-10-CM | POA: Diagnosis not present

## 2018-09-26 DIAGNOSIS — J3089 Other allergic rhinitis: Secondary | ICD-10-CM | POA: Diagnosis not present

## 2018-10-02 ENCOUNTER — Other Ambulatory Visit: Payer: Self-pay | Admitting: Internal Medicine

## 2018-10-02 NOTE — Telephone Encounter (Signed)
[place] Controlled Database Checked Last filled: 09/10/18 # 70 Next appt w/you: 10/14/18

## 2018-10-03 DIAGNOSIS — J301 Allergic rhinitis due to pollen: Secondary | ICD-10-CM | POA: Diagnosis not present

## 2018-10-03 DIAGNOSIS — J3089 Other allergic rhinitis: Secondary | ICD-10-CM | POA: Diagnosis not present

## 2018-10-06 IMAGING — DX DG CHEST 2V
2 series · 2 of 2 positions shown · non-contrast
Comparison: 03/16/2016

CLINICAL DATA: Nonproductive cough and fever for 1 week

EXAM:
CHEST  2 VIEW

[chest pa]
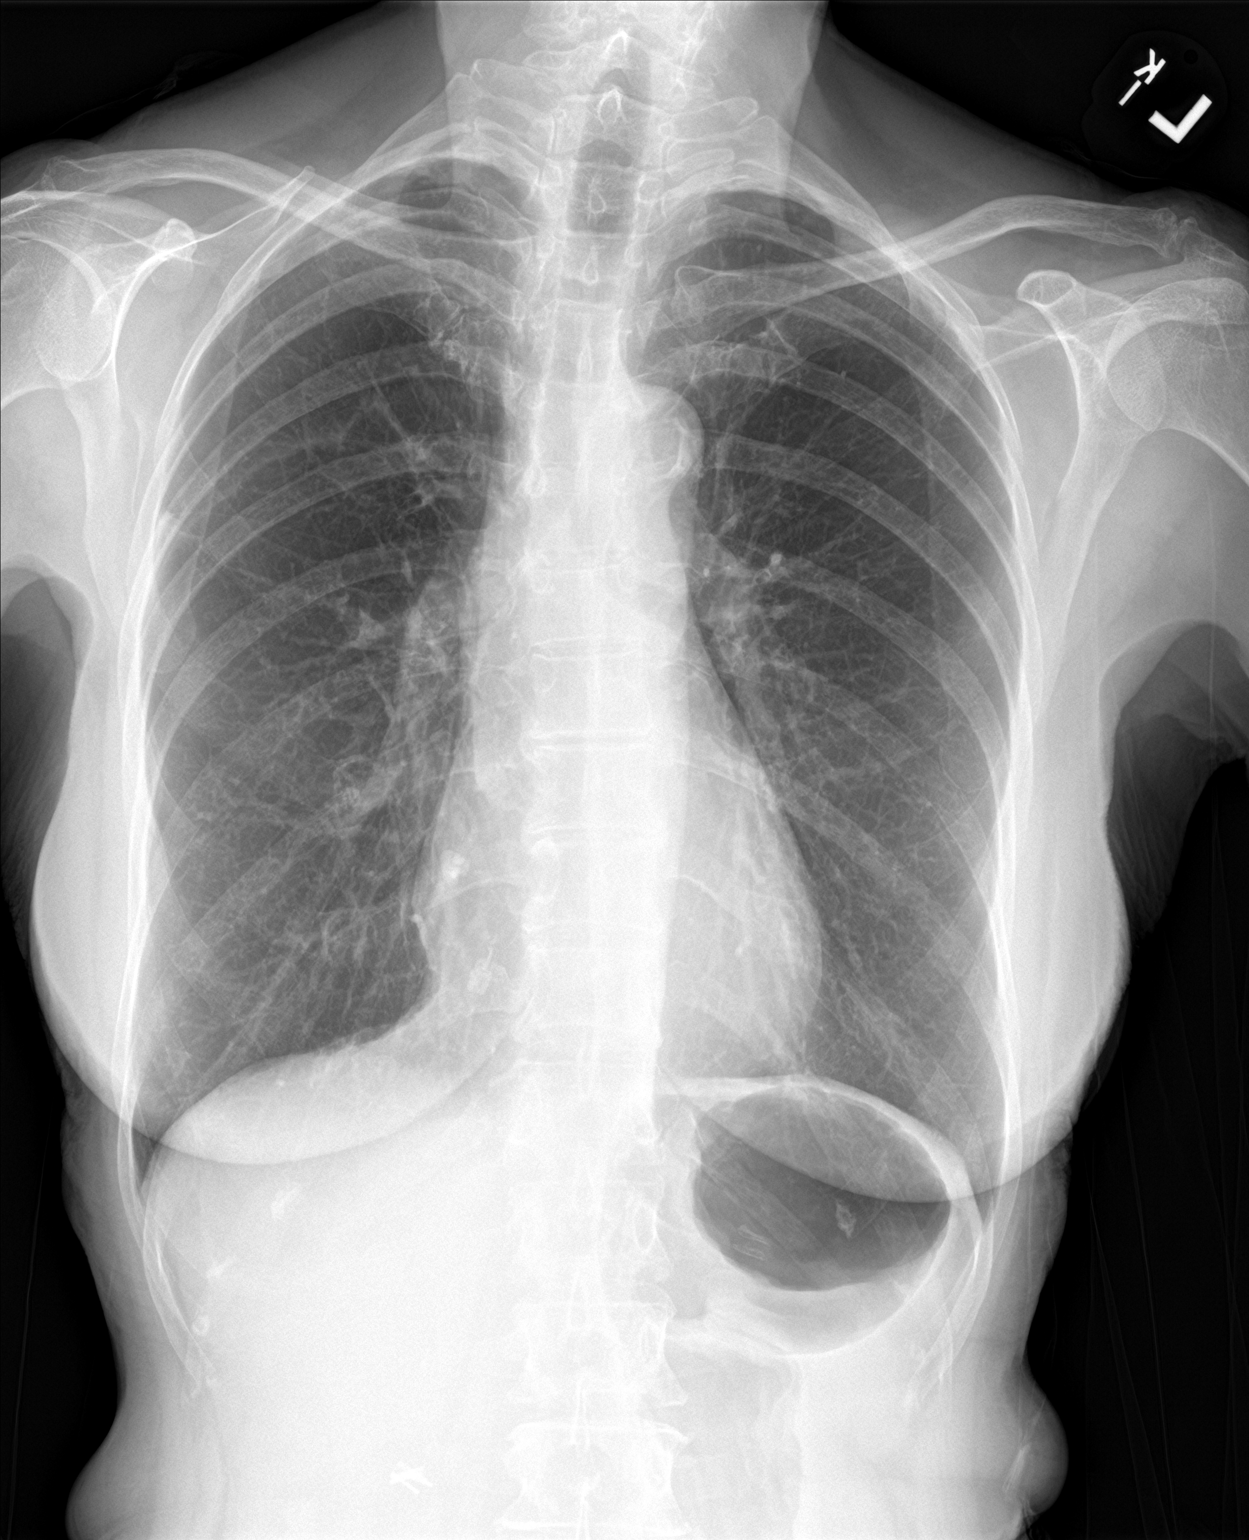

[chest lat]
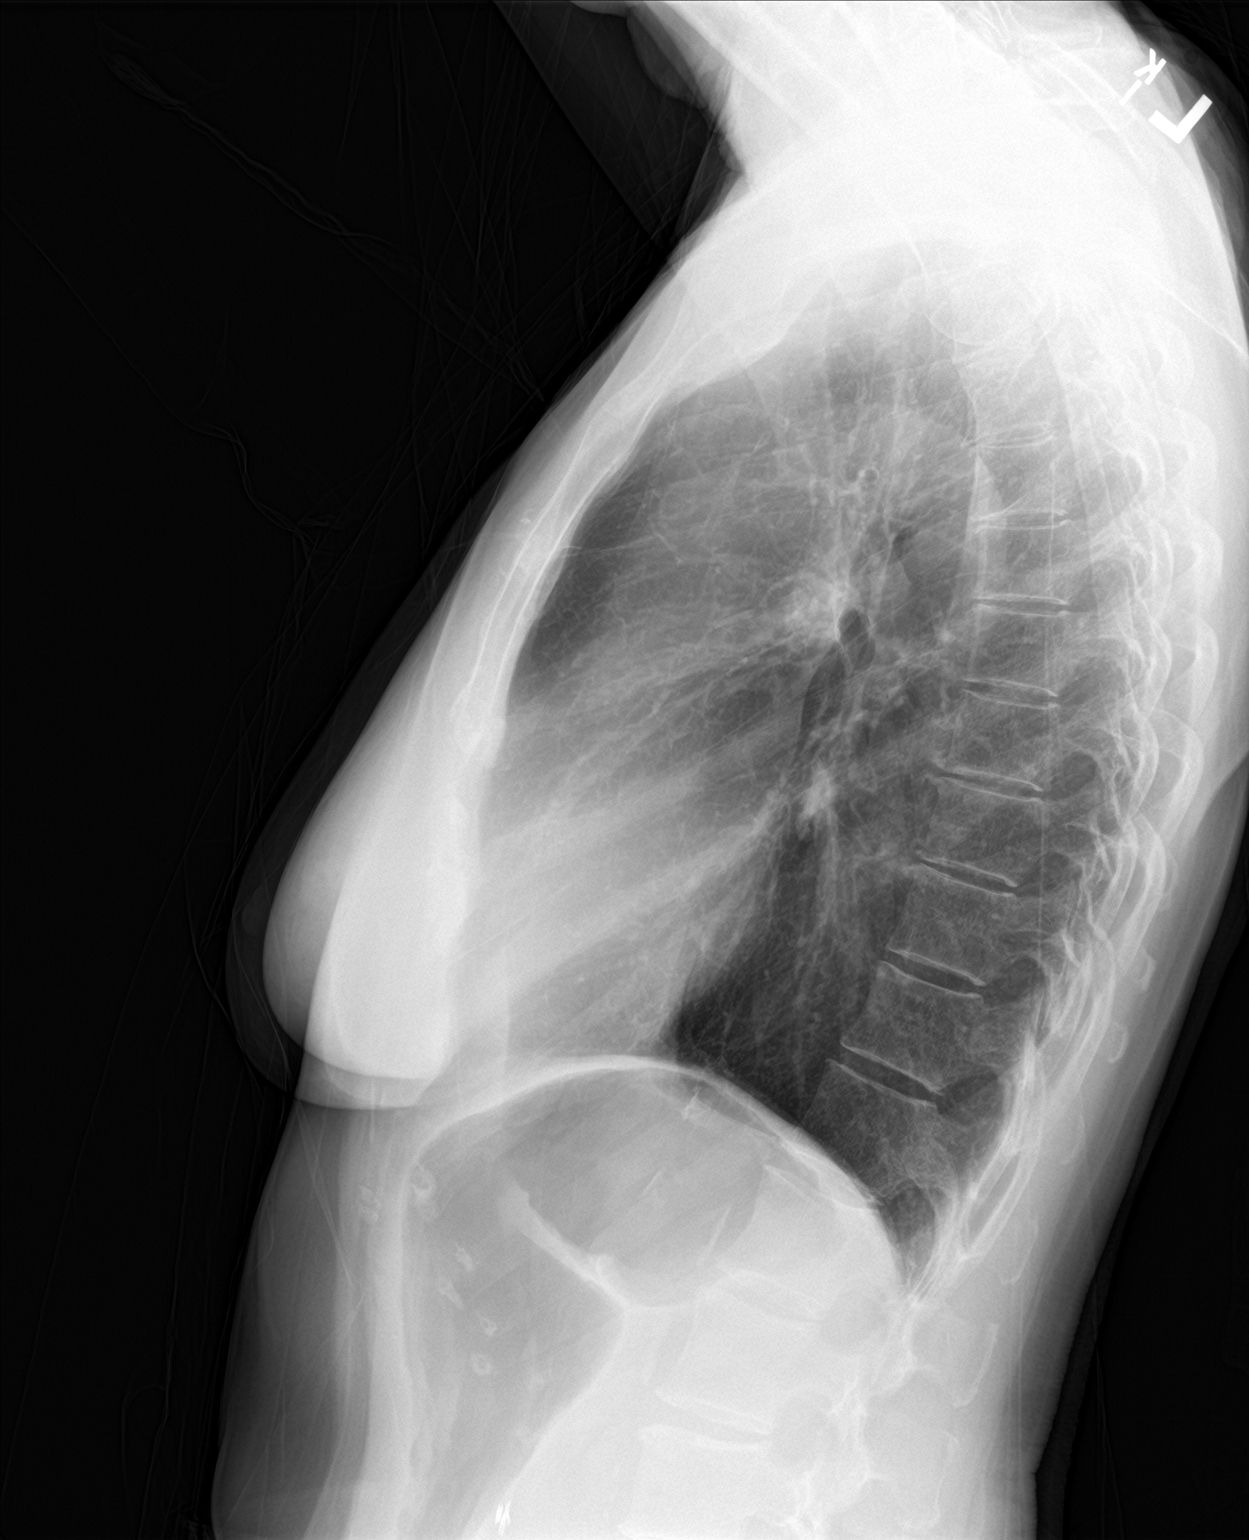

[2 of 2 positions shown; findings below may reference images not displayed]

FINDINGS: There is hyperinflation of the lungs compatible with COPD. Heart and
mediastinal contours are within normal limits. No focal opacities or
effusions. No acute bony abnormality.
IMPRESSION: COPD.  No active disease.

## 2018-10-09 ENCOUNTER — Ambulatory Visit: Payer: Medicare Other

## 2018-10-09 ENCOUNTER — Ambulatory Visit: Payer: Medicare Other | Admitting: Internal Medicine

## 2018-10-10 DIAGNOSIS — J301 Allergic rhinitis due to pollen: Secondary | ICD-10-CM | POA: Diagnosis not present

## 2018-10-10 DIAGNOSIS — J3089 Other allergic rhinitis: Secondary | ICD-10-CM | POA: Diagnosis not present

## 2018-10-14 ENCOUNTER — Ambulatory Visit (INDEPENDENT_AMBULATORY_CARE_PROVIDER_SITE_OTHER): Payer: Medicare Other | Admitting: Internal Medicine

## 2018-10-14 ENCOUNTER — Encounter: Payer: Self-pay | Admitting: Internal Medicine

## 2018-10-14 ENCOUNTER — Other Ambulatory Visit (INDEPENDENT_AMBULATORY_CARE_PROVIDER_SITE_OTHER): Payer: Medicare Other

## 2018-10-14 ENCOUNTER — Other Ambulatory Visit: Payer: Self-pay

## 2018-10-14 VITALS — BP 136/86 | HR 100 | Temp 98.0°F | Ht 63.75 in | Wt 108.0 lb

## 2018-10-14 DIAGNOSIS — Z0000 Encounter for general adult medical examination without abnormal findings: Secondary | ICD-10-CM | POA: Diagnosis not present

## 2018-10-14 DIAGNOSIS — I10 Essential (primary) hypertension: Secondary | ICD-10-CM

## 2018-10-14 DIAGNOSIS — F411 Generalized anxiety disorder: Secondary | ICD-10-CM

## 2018-10-14 DIAGNOSIS — G4700 Insomnia, unspecified: Secondary | ICD-10-CM

## 2018-10-14 LAB — COMPREHENSIVE METABOLIC PANEL
ALT: 14 U/L (ref 0–35)
AST: 21 U/L (ref 0–37)
Albumin: 4.4 g/dL (ref 3.5–5.2)
Alkaline Phosphatase: 102 U/L (ref 39–117)
BUN: 7 mg/dL (ref 6–23)
CO2: 25 mEq/L (ref 19–32)
Calcium: 9.4 mg/dL (ref 8.4–10.5)
Chloride: 97 mEq/L (ref 96–112)
Creatinine, Ser: 0.68 mg/dL (ref 0.40–1.20)
GFR: 82.96 mL/min (ref >60.00)
Glucose, Bld: 75 mg/dL (ref 70–99)
Potassium: 4.4 mEq/L (ref 3.5–5.1)
Sodium: 135 mEq/L (ref 135–145)
Total Bilirubin: 0.4 mg/dL (ref 0.2–1.2)
Total Protein: 7.2 g/dL (ref 6.0–8.3)

## 2018-10-14 LAB — LIPID PANEL
Cholesterol: 176 mg/dL (ref 0–200)
HDL: 86.4 mg/dL (ref >39.00)
LDL Cholesterol: 60 mg/dL (ref 0–99)
NonHDL: 89.39
Total CHOL/HDL Ratio: 2
Triglycerides: 145 mg/dL (ref 0.0–149.0)
VLDL: 29 mg/dL (ref 0.0–40.0)

## 2018-10-14 LAB — RUBEOLA ANTIBODY IGG: Measles Virus Antibody CSF: >300

## 2018-10-14 LAB — CBC
HCT: 43.9 % (ref 36.0–46.0)
Hemoglobin: 15.1 g/dL — ABNORMAL HIGH (ref 12.0–15.0)
MCHC: 34.4 g/dL (ref 30.0–36.0)
MCV: 99.9 fl (ref 78.0–100.0)
Platelets: 256 10*3/uL (ref 150.0–400.0)
RBC: 4.39 Mil/uL (ref 3.87–5.11)
RDW: 12.6 % (ref 11.5–15.5)
WBC: 4.9 10*3/uL (ref 4.0–10.5)

## 2018-10-14 LAB — MUMPS ANTIBODY, IGG: MUMPS ABS, IGG: 172

## 2018-10-14 LAB — MEASLES/MUMPS/RUBELLA IMMUNITY: MEASLES (RUBEOLA) IGG,IFA: 16.2

## 2018-10-14 MED ORDER — AMLODIPINE BESYLATE 2.5 MG PO TABS: 2.5000 mg | ORAL_TABLET | Freq: Every day | ORAL | 3 refills | Status: DC

## 2018-10-14 MED ORDER — TRAZODONE HCL 100 MG PO TABS: 100.0000 mg | ORAL_TABLET | Freq: Every day | ORAL | 3 refills | Status: DC

## 2018-10-14 NOTE — Patient Instructions (Signed)

## 2018-10-14 NOTE — Progress Notes (Signed)
Subjective:   Patient ID: [name] [name], female    DOB: Oct 25, 1935, 83 y.o.   MRN: [id number]  HPI Here for medicare wellness, no new complaints. Please see A/P for status and treatment of chronic medical problems.   HPI #2: Here for follow up blood pressure (taking amlodipine, denies side effects, BP at goal at home, denies chest pains or headaches) and insomnia (using trazodone at night time and sleeping well, denies side effects, uses all the time, sleeping 5-6 hours most nights),   Diet: heart healthy Physical activity: sedentary, walking some Depression/mood screen: negative Hearing: moderate loss, bilateral aids not wearing today Visual acuity: grossly normal with lens, performs annual eye exam  ADLs: capable Fall risk: none Home safety: good Cognitive evaluation: intact to orientation, naming, recall and repetition EOL planning: adv directives discussed    Office Visit from 10/14/2018 in [place]  PHQ-2 Total Score  1      I have personally reviewed and have noted 1. The patient's medical and social history - reviewed today no changes 2. Their use of alcohol, tobacco or illicit drugs 3. Their current medications and supplements 4. The patient's functional ability including ADL's, fall risks, home safety risks and hearing or visual impairment. 5. Diet and physical activities 6. Evidence for depression or mood disorders 7. Care team reviewed and updated  Patient Care Team: Hoyt Koch, MD as PCP - General (Internal Medicine) Past Medical History:  Diagnosis Date  . Anxiety   . Coronary artery spasm (HCC)    Non obstructive CAD  . Diverticulosis of colon   . Fibromyalgia   . Hx of colonic polyps   . Lumbar back pain   . MVP (mitral valve prolapse)    No antibiotics required for procedures  . Osteoporosis   . Palpitations   . Pneumonia   . Shingles    Past Surgical History:  Procedure Laterality Date  . CATARACT  EXTRACTION, BILATERAL    . CHOLECYSTECTOMY  03/2003  . LUMBAR LAMINECTOMY    . TONSILLECTOMY     as a child   Family History  Problem Relation Age of Onset  . Rheum arthritis Mother   . Stroke Mother   . Hypertension Mother   . Heart failure Father   . Heart disease Father   . Asthma Maternal Grandfather   . Allergies Maternal Grandfather   . COPD Maternal Aunt         2 aunts  . Hypertension Brother    Review of Systems  Constitutional: Negative.   HENT: Negative.   Eyes: Negative.   Respiratory: Negative for cough, chest tightness and shortness of breath.   Cardiovascular: Negative for chest pain, palpitations and leg swelling.  Gastrointestinal: Negative for abdominal distention, abdominal pain, constipation, diarrhea, nausea and vomiting.  Musculoskeletal: Negative.   Skin: Negative.   Neurological: Negative.   Psychiatric/Behavioral: Negative.     Objective:  Physical Exam Constitutional:      Appearance: She is well-developed.  HENT:     Head: Normocephalic and atraumatic.  Neck:     Musculoskeletal: Normal range of motion.  Cardiovascular:     Rate and Rhythm: Normal rate and regular rhythm.  Pulmonary:     Effort: Pulmonary effort is normal. No respiratory distress.     Breath sounds: Normal breath sounds. No wheezing or rales.  Abdominal:     General: Bowel sounds are normal. There is no distension.     Palpations: Abdomen  is soft.     Tenderness: There is no abdominal tenderness. There is no rebound.  Skin:    General: Skin is warm and dry.  Neurological:     Mental Status: She is alert and oriented to person, place, and time.     Coordination: Coordination normal.    Vitals:   10/14/18 0802  BP: 136/86  Pulse: 100  Temp: 98 F (36.7 C)  TempSrc: Oral  SpO2: 98%  Weight: 108 lb (49 kg)  Height: 5' 3.75" (1.619 m)    Assessment & Plan:

## 2018-10-14 NOTE — Assessment & Plan Note (Signed)
Taking xanax and slightly more recently. Reminded her that her #70 is intended for 1 month supply and she should not take more than this.

## 2018-10-14 NOTE — Assessment & Plan Note (Signed)
Taking amlodipine 2.5 mg daily. Checking CMP and adjust as needed.

## 2018-10-14 NOTE — Assessment & Plan Note (Signed)
Flu shot counseled. Pneumonia complete. Shingrix complete. Tetanus due 2021. Colonoscopy aged out. Mammogram aged out, pap smear aged out and dexa due 2021. Counseled about sun safety and mole surveillance. Counseled about the dangers of distracted driving. Given 10 year screening recommendations.

## 2018-10-14 NOTE — Assessment & Plan Note (Signed)
Taking trazodone daily and this is helping well at this time.

## 2018-10-17 DIAGNOSIS — J301 Allergic rhinitis due to pollen: Secondary | ICD-10-CM | POA: Diagnosis not present

## 2018-10-17 DIAGNOSIS — J3089 Other allergic rhinitis: Secondary | ICD-10-CM | POA: Diagnosis not present

## 2018-10-24 DIAGNOSIS — J3089 Other allergic rhinitis: Secondary | ICD-10-CM | POA: Diagnosis not present

## 2018-10-24 DIAGNOSIS — J301 Allergic rhinitis due to pollen: Secondary | ICD-10-CM | POA: Diagnosis not present

## 2018-11-07 DIAGNOSIS — J3089 Other allergic rhinitis: Secondary | ICD-10-CM | POA: Diagnosis not present

## 2018-11-07 DIAGNOSIS — J301 Allergic rhinitis due to pollen: Secondary | ICD-10-CM | POA: Diagnosis not present

## 2018-11-08 ENCOUNTER — Other Ambulatory Visit: Payer: Self-pay | Admitting: Internal Medicine

## 2018-11-08 NOTE — Telephone Encounter (Signed)
Pt following up on request for this med.  Pt states she needs today, because she will be out Sunday. Pt also requesting refills to be put on this medication.  ALPRAZolam Duanne Moron) 0.5 MG tablet  [place], [place] [phone] (Phone) [phone] (Fax)

## 2018-11-08 NOTE — Telephone Encounter (Signed)
Control database checked last refill: 10/03/2018 LOV: 10/14/2018 [id number]

## 2018-11-14 DIAGNOSIS — J301 Allergic rhinitis due to pollen: Secondary | ICD-10-CM | POA: Diagnosis not present

## 2018-11-14 DIAGNOSIS — J3089 Other allergic rhinitis: Secondary | ICD-10-CM | POA: Diagnosis not present

## 2018-11-18 ENCOUNTER — Other Ambulatory Visit: Payer: Self-pay

## 2018-11-19 ENCOUNTER — Ambulatory Visit (INDEPENDENT_AMBULATORY_CARE_PROVIDER_SITE_OTHER): Payer: Medicare Other | Admitting: Obstetrics & Gynecology

## 2018-11-19 ENCOUNTER — Encounter: Payer: Self-pay | Admitting: Obstetrics & Gynecology

## 2018-11-19 ENCOUNTER — Telehealth: Payer: Self-pay

## 2018-11-19 VITALS — BP 144/86 | Ht 62.5 in | Wt 108.6 lb

## 2018-11-19 DIAGNOSIS — Z780 Asymptomatic menopausal state: Secondary | ICD-10-CM

## 2018-11-19 DIAGNOSIS — Z01419 Encounter for gynecological examination (general) (routine) without abnormal findings: Secondary | ICD-10-CM

## 2018-11-19 DIAGNOSIS — Z124 Encounter for screening for malignant neoplasm of cervix: Secondary | ICD-10-CM

## 2018-11-19 DIAGNOSIS — M8589 Other specified disorders of bone density and structure, multiple sites: Secondary | ICD-10-CM

## 2018-11-19 DIAGNOSIS — E2839 Other primary ovarian failure: Secondary | ICD-10-CM

## 2018-11-19 NOTE — Patient Instructions (Signed)
1. Encounter for routine gynecological examination with Papanicolaou smear of cervix Normal gynecologic exam in menopause.  Pap reflex done.  Breast exam normal.  Last screening mammogram February 2020 was negative.  Colonoscopy 2009.  Health labs with family physician.  Body mass index 19.55.  Continue with regular walking.  2. Postmenopause Well on no hormone replacement therapy.  No postmenopausal bleeding.  3. Osteopenia of multiple sites Last bone density in April 2018 showed osteopenia.  Will organize a repeat bone density through her family physician now.  Vitamin D supplements, calcium intake of 1200 mg daily and regular weightbearing physical activity is recommended.  [name] [name], it was a pleasure seeing you today!  I will inform you of your results as soon as they are available.

## 2018-11-19 NOTE — Progress Notes (Signed)
[name] [name] 1936/03/11 [id number]   History:    83 y.o. G1P1L1 Widowed  RP:  Established patient presenting for annual gyn exam   HPI: Postmenopausal, well on no hormone replacement therapy.  No postmenopausal bleeding.  No pelvic pain.  Abstinent.  Urine and bowel movements normal.  Breasts normal.  Body mass index 19.55.  Walking regularly.  Health labs with family physician.  Past medical history,surgical history, family history and social history were all reviewed and documented in the EPIC chart.  Gynecologic History No LMP recorded. Patient is postmenopausal. Contraception: abstinence and post menopausal status Last Pap: 06/2010. Results were: Negative Last mammogram: 06/2018. Results were: Negative Bone Density: 07/2016 Osteopenia -1.9 Spine Colonoscopy: 2009  Obstetric History OB History  Gravida Para Term Preterm AB Living  1 1 1    0 1  SAB TAB Ectopic Multiple Live Births               # Outcome Date GA Lbr Len/2nd Weight Sex Delivery Anes PTL Lv  1 Term              ROS: A ROS was performed and pertinent positives and negatives are included in the history.  GENERAL: No fevers or chills. HEENT: No change in vision, no earache, sore throat or sinus congestion. NECK: No pain or stiffness. CARDIOVASCULAR: No chest pain or pressure. No palpitations. PULMONARY: No shortness of breath, cough or wheeze. GASTROINTESTINAL: No abdominal pain, nausea, vomiting or diarrhea, melena or bright red blood per rectum. GENITOURINARY: No urinary frequency, urgency, hesitancy or dysuria. MUSCULOSKELETAL: No joint or muscle pain, no back pain, no recent trauma. DERMATOLOGIC: No rash, no itching, no lesions. ENDOCRINE: No polyuria, polydipsia, no heat or cold intolerance. No recent change in weight. HEMATOLOGICAL: No anemia or easy bruising or bleeding. NEUROLOGIC: No headache, seizures, numbness, tingling or weakness. PSYCHIATRIC: No depression, no loss of interest in normal activity or  change in sleep pattern.     Exam:   BP (!) 144/86    Ht 5' 2.5" (1.588 m)    Wt 108 lb 9.6 oz (49.3 kg)    BMI 19.55 kg/m   Body mass index is 19.55 kg/m.  General appearance : Well developed well nourished female. No acute distress HEENT: Eyes: no retinal hemorrhage or exudates,  Neck supple, trachea midline, no carotid bruits, no thyroidmegaly Lungs: Clear to auscultation, no rhonchi or wheezes, or rib retractions  Heart: Regular rate and rhythm, no murmurs or gallops Breast:Examined in sitting and supine position were symmetrical in appearance, no palpable masses or tenderness,  no skin retraction, no nipple inversion, no nipple discharge, no skin discoloration, no axillary or supraclavicular lymphadenopathy Abdomen: no palpable masses or tenderness, no rebound or guarding Extremities: no edema or skin discoloration or tenderness  Pelvic: Vulva: Normal             Vagina: No gross lesions or discharge  Cervix: No gross lesions or discharge.  Pap reflex done.  Uterus  AV, normal size, shape and consistency, non-tender and mobile  Adnexa  Without masses or tenderness  Anus: Normal   Assessment/Plan:  83 y.o. female for annual exam   1. Encounter for routine gynecological examination with Papanicolaou smear of cervix Normal gynecologic exam in menopause.  Pap reflex done.  Breast exam normal.  Last screening mammogram February 2020 was negative.  Colonoscopy 2009.  Health labs with family physician.  Body mass index 19.55.  Continue with regular walking.  2. Postmenopause Well  on no hormone replacement therapy.  No postmenopausal bleeding.  3. Osteopenia of multiple sites Last bone density in April 2018 showed osteopenia.  Will organize a repeat bone density through her family physician now.  Vitamin D supplements, calcium intake of 1200 mg daily and regular weightbearing physical activity is recommended.  Princess Bruins MD, 8:47 AM 11/19/2018

## 2018-11-19 NOTE — Telephone Encounter (Signed)
Can wait until PCP returns

## 2018-11-19 NOTE — Telephone Encounter (Signed)
Copied from [place] [id number]. Topic: General - Other >> Nov 19, 2018 10:20 AM Yvette Rack wrote: Reason for CRM: Pt stated she had an appt with her Gyn doctor and she was told that she needs to have bone density test done. Pt requests that a bone density test be ordered. Cb# [phone]  Routing to dr burns, patient's last bone density was April/2018---are you ok with ordering bone density in the absence of dr crawford, please advise, we will call patient back to schedule if you order, thanks

## 2018-11-19 NOTE — Addendum Note (Signed)
Addended by: Thurnell Garbe A on: 11/19/2018 09:29 AM   Modules accepted: Orders

## 2018-11-20 LAB — PAP IG W/ RFLX HPV ASCU

## 2018-11-20 NOTE — Telephone Encounter (Signed)
Routing to dr crawford, please advise, I will call patient back, thanks

## 2018-11-21 DIAGNOSIS — J301 Allergic rhinitis due to pollen: Secondary | ICD-10-CM | POA: Diagnosis not present

## 2018-11-21 DIAGNOSIS — J3089 Other allergic rhinitis: Secondary | ICD-10-CM | POA: Diagnosis not present

## 2018-11-26 NOTE — Telephone Encounter (Signed)
Pt following up on referral for a bone density. Pt has done at [facility].  Pt would like call back to schedule asap.

## 2018-11-26 NOTE — Telephone Encounter (Signed)
This is not an emergency, ordered. Last DEXA was normal.

## 2018-11-26 NOTE — Addendum Note (Signed)
Addended by: Pricilla Holm A on: 11/26/2018 11:34 AM   Modules accepted: Orders

## 2018-11-28 DIAGNOSIS — J301 Allergic rhinitis due to pollen: Secondary | ICD-10-CM | POA: Diagnosis not present

## 2018-11-28 DIAGNOSIS — J3089 Other allergic rhinitis: Secondary | ICD-10-CM | POA: Diagnosis not present

## 2018-12-05 DIAGNOSIS — J3089 Other allergic rhinitis: Secondary | ICD-10-CM | POA: Diagnosis not present

## 2018-12-05 DIAGNOSIS — J301 Allergic rhinitis due to pollen: Secondary | ICD-10-CM | POA: Diagnosis not present

## 2018-12-12 DIAGNOSIS — J301 Allergic rhinitis due to pollen: Secondary | ICD-10-CM | POA: Diagnosis not present

## 2018-12-12 DIAGNOSIS — J3089 Other allergic rhinitis: Secondary | ICD-10-CM | POA: Diagnosis not present

## 2018-12-19 DIAGNOSIS — J3089 Other allergic rhinitis: Secondary | ICD-10-CM | POA: Diagnosis not present

## 2018-12-19 DIAGNOSIS — J301 Allergic rhinitis due to pollen: Secondary | ICD-10-CM | POA: Diagnosis not present

## 2018-12-26 DIAGNOSIS — J3089 Other allergic rhinitis: Secondary | ICD-10-CM | POA: Diagnosis not present

## 2018-12-26 DIAGNOSIS — J301 Allergic rhinitis due to pollen: Secondary | ICD-10-CM | POA: Diagnosis not present

## 2018-12-31 DIAGNOSIS — H26493 Other secondary cataract, bilateral: Secondary | ICD-10-CM | POA: Diagnosis not present

## 2018-12-31 DIAGNOSIS — H10413 Chronic giant papillary conjunctivitis, bilateral: Secondary | ICD-10-CM | POA: Diagnosis not present

## 2018-12-31 DIAGNOSIS — H52203 Unspecified astigmatism, bilateral: Secondary | ICD-10-CM | POA: Diagnosis not present

## 2018-12-31 DIAGNOSIS — Z961 Presence of intraocular lens: Secondary | ICD-10-CM | POA: Diagnosis not present

## 2019-01-02 DIAGNOSIS — J3089 Other allergic rhinitis: Secondary | ICD-10-CM | POA: Diagnosis not present

## 2019-01-02 DIAGNOSIS — J301 Allergic rhinitis due to pollen: Secondary | ICD-10-CM | POA: Diagnosis not present

## 2019-01-03 DIAGNOSIS — J3089 Other allergic rhinitis: Secondary | ICD-10-CM | POA: Diagnosis not present

## 2019-01-03 DIAGNOSIS — J301 Allergic rhinitis due to pollen: Secondary | ICD-10-CM | POA: Diagnosis not present

## 2019-01-09 DIAGNOSIS — J301 Allergic rhinitis due to pollen: Secondary | ICD-10-CM | POA: Diagnosis not present

## 2019-01-09 DIAGNOSIS — J3089 Other allergic rhinitis: Secondary | ICD-10-CM | POA: Diagnosis not present

## 2019-01-16 DIAGNOSIS — J3089 Other allergic rhinitis: Secondary | ICD-10-CM | POA: Diagnosis not present

## 2019-01-16 DIAGNOSIS — J301 Allergic rhinitis due to pollen: Secondary | ICD-10-CM | POA: Diagnosis not present

## 2019-01-23 DIAGNOSIS — J3089 Other allergic rhinitis: Secondary | ICD-10-CM | POA: Diagnosis not present

## 2019-01-23 DIAGNOSIS — J301 Allergic rhinitis due to pollen: Secondary | ICD-10-CM | POA: Diagnosis not present

## 2019-01-27 NOTE — Progress Notes (Signed)
HPI The patient presents for followup of hypertension and palpitations.   She had dyspnea in 2016.  However, POET (Plain Old Exercise Treadmill) was negative for evidence of ischemia.  She returns for follow up.  Since I last saw her she has had some increased palpitations.  She feels her heart racing sometimes particularly when she wakes up in the morning.  She has occasional skipping beats when she is sitting quietly.  She thinks it is more than it was before.  Her blood pressure fluctuates a little bit.  She is typically getting 140s in the mid 21s.  She is not having any presyncope or syncope.  She is not having any chest pressure, neck or arm discomfort.  She has no new shortness of breath, PND or orthopnea.  She has no weight gain or edema.   Allergies  Allergen Reactions  . Codeine     REACTION: NAUSEA  . Erythromycin     REACTION: NAUSEA  . Other     Environmental allergies    Current Outpatient Medications  Medication Sig Dispense Refill  . ALPRAZolam (XANAX) 0.5 MG tablet TAKE 1/2 TO 1 TABLET THREE TIMES DAILY AS NEEDED. 70 tablet 2  . aspirin 81 MG tablet Take 81 mg by mouth daily.      Marland Kitchen BIOTIN 5000 PO Take by mouth 2 (two) times daily.     . calcium carbonate (CALCIUM 600) 600 MG TABS Take 600 mg by mouth daily.     . cetirizine (ZYRTEC) 10 MG tablet Take 10 mg by mouth daily.      . Cholecalciferol (VITAMIN D) 2000 units tablet Take 2,000 Units by mouth daily.    Marland Kitchen EPINEPHrine (EPI-PEN) 0.3 mg/0.3 mL DEVI Inject 0.3 mg into the muscle as needed.    . fish oil-omega-3 fatty acids 1000 MG capsule Take 1 g by mouth daily.      . folic acid (FOLVITE) Q000111Q MCG tablet Take 800 mcg by mouth daily.    . Multiple Vitamins-Minerals (PROTEGRA PO) Take by mouth daily.      . nitroGLYCERIN (NITROSTAT) 0.4 MG SL tablet Place 1 tablet (0.4 mg total) under the tongue every 5 (five) minutes as needed. May repeat 3 times 25 tablet 2  . NONFORMULARY OR COMPOUNDED ITEM Estradiol 0.02%  cream 1/4 ml twice weekly 24 each 2  . Pyridoxine HCl (VITAMIN B6 PO) Take by mouth.    . traZODone (DESYREL) 100 MG tablet Take 1 tablet (100 mg total) by mouth at bedtime. 90 tablet 3  . TURMERIC PO Take 1,500 mg by mouth daily.    Marland Kitchen UNABLE TO FIND Allergy shots weekly    . diltiazem (CARDIZEM CD) 120 MG 24 hr capsule Take 1 capsule (120 mg total) by mouth daily. 90 capsule 3   No current facility-administered medications for this visit.     Past Medical History:  Diagnosis Date  . Anxiety   . Coronary artery spasm (HCC)    Non obstructive CAD  . Diverticulosis of colon   . Fibromyalgia   . Hx of colonic polyps   . Lumbar back pain   . MVP (mitral valve prolapse)    No antibiotics required for procedures  . Osteoporosis   . Palpitations   . Pneumonia   . Shingles     Past Surgical History:  Procedure Laterality Date  . CATARACT EXTRACTION, BILATERAL    . CATARACT EXTRACTION, BILATERAL  05/2018  . CHOLECYSTECTOMY  03/2003  .  LUMBAR LAMINECTOMY    . TONSILLECTOMY     as a child    ROS:  As stated in the HPI and negative for all other systems.  PHYSICAL EXAM BP 136/70   Pulse 92   Ht 5\' 3"  (1.6 m)   Wt 111 lb 12.8 oz (50.7 kg)   BMI 19.80 kg/m   GENERAL:  Well appearing NECK:  No jugular venous distention, waveform within normal limits, carotid upstroke brisk and symmetric, no bruits, no thyromegaly LUNGS:  Clear to auscultation bilaterally CHEST:  Unremarkable HEART:  PMI not displaced or sustained,S1 and S2 within normal limits, no S3, no S4, no clicks, no rubs, no murmurs ABD:  Flat, positive bowel sounds normal in frequency in pitch, no bruits, no rebound, no guarding, no midline pulsatile mass, no hepatomegaly, no splenomegaly EXT:  2 plus pulses throughout, no edema, no cyanosis no clubbing   EKG: Normal sinus rhythm, rate 92, left axis, LAFB, poor anterior R-wave progression, no acute ST-T wave changes. No change from previous.  01/28/2019  ASSESSMENT AND  PLAN  PALPITATIONS:    The patient is having increasing palpitations.  Stop Norvasc and start Cardizem 120 mg daily.  Further management for evaluation will be based on her response to this change.  HTN: Her blood pressure is marginally elevated.  I can see if the change the Cardizem helps to reduce to a better target.  ABNORMAL EKG:    She has poor anterior R wave progression.  She has left anterior fascicular block.  However, she not having any symptoms.  No change in therapy.

## 2019-01-28 ENCOUNTER — Other Ambulatory Visit: Payer: Self-pay

## 2019-01-28 ENCOUNTER — Ambulatory Visit: Payer: Medicare Other | Admitting: Cardiology

## 2019-01-28 ENCOUNTER — Ambulatory Visit (INDEPENDENT_AMBULATORY_CARE_PROVIDER_SITE_OTHER): Payer: Medicare Other | Admitting: Cardiology

## 2019-01-28 ENCOUNTER — Encounter: Payer: Self-pay | Admitting: Cardiology

## 2019-01-28 VITALS — BP 136/70 | HR 92 | Ht 63.0 in | Wt 111.8 lb

## 2019-01-28 DIAGNOSIS — R002 Palpitations: Secondary | ICD-10-CM

## 2019-01-28 DIAGNOSIS — I341 Nonrheumatic mitral (valve) prolapse: Secondary | ICD-10-CM | POA: Diagnosis not present

## 2019-01-28 DIAGNOSIS — R0600 Dyspnea, unspecified: Secondary | ICD-10-CM | POA: Diagnosis not present

## 2019-01-28 DIAGNOSIS — R9431 Abnormal electrocardiogram [ECG] [EKG]: Secondary | ICD-10-CM

## 2019-01-28 MED ORDER — DILTIAZEM HCL ER COATED BEADS 120 MG PO CP24: 120.0000 mg | ORAL_CAPSULE | Freq: Every day | ORAL | 3 refills | Status: DC

## 2019-01-28 MED ORDER — NITROGLYCERIN 0.4 MG SL SUBL: 0.4000 mg | SUBLINGUAL_TABLET | SUBLINGUAL | 2 refills | Status: DC | PRN

## 2019-01-28 NOTE — Patient Instructions (Signed)
Medication Instructions:  Stop taking Norsvac. Starting taking Cardizem 120mg  daily.   If you need a refill on your cardiac medications before your next appointment, please call your pharmacy.   Lab work: NONE  Testing/Procedures: NONE  Follow-Up: At [facility], you and your health needs are our priority.  As part of our continuing mission to provide you with exceptional heart care, we have created designated Provider Care Teams.  These Care Teams include your primary Cardiologist (physician) and Advanced Practice Providers (APPs -  Physician Assistants and Nurse Practitioners) who all work together to provide you with the care you need, when you need it. You will need a follow up appointment in 12 months.  Please call our office 2 months in advance to schedule this appointment.  You may see Dr. Percival Spanish or one of the following Advanced Practice Providers on your designated Care Team:   Rosaria Ferries, PA-C Jory Sims, DNP, ANP

## 2019-01-31 DIAGNOSIS — J301 Allergic rhinitis due to pollen: Secondary | ICD-10-CM | POA: Diagnosis not present

## 2019-01-31 DIAGNOSIS — J3089 Other allergic rhinitis: Secondary | ICD-10-CM | POA: Diagnosis not present

## 2019-02-06 DIAGNOSIS — J301 Allergic rhinitis due to pollen: Secondary | ICD-10-CM | POA: Diagnosis not present

## 2019-02-06 DIAGNOSIS — J3089 Other allergic rhinitis: Secondary | ICD-10-CM | POA: Diagnosis not present

## 2019-02-13 DIAGNOSIS — J301 Allergic rhinitis due to pollen: Secondary | ICD-10-CM | POA: Diagnosis not present

## 2019-02-13 DIAGNOSIS — J3089 Other allergic rhinitis: Secondary | ICD-10-CM | POA: Diagnosis not present

## 2019-02-18 ENCOUNTER — Other Ambulatory Visit: Payer: Self-pay | Admitting: Internal Medicine

## 2019-02-20 DIAGNOSIS — J301 Allergic rhinitis due to pollen: Secondary | ICD-10-CM | POA: Diagnosis not present

## 2019-02-20 DIAGNOSIS — J3089 Other allergic rhinitis: Secondary | ICD-10-CM | POA: Diagnosis not present

## 2019-02-21 ENCOUNTER — Ambulatory Visit (INDEPENDENT_AMBULATORY_CARE_PROVIDER_SITE_OTHER)
Admission: RE | Admit: 2019-02-21 | Discharge: 2019-02-21 | Disposition: A | Discharge status: Home / Self Care | Payer: Medicare Other | Source: Ambulatory Visit | Attending: Internal Medicine | Admitting: Internal Medicine

## 2019-02-21 ENCOUNTER — Other Ambulatory Visit: Payer: Self-pay

## 2019-02-21 DIAGNOSIS — E2839 Other primary ovarian failure: Secondary | ICD-10-CM | POA: Diagnosis not present

## 2019-02-28 DIAGNOSIS — J301 Allergic rhinitis due to pollen: Secondary | ICD-10-CM | POA: Diagnosis not present

## 2019-02-28 DIAGNOSIS — J3089 Other allergic rhinitis: Secondary | ICD-10-CM | POA: Diagnosis not present

## 2019-03-06 DIAGNOSIS — J3089 Other allergic rhinitis: Secondary | ICD-10-CM | POA: Diagnosis not present

## 2019-03-06 DIAGNOSIS — J301 Allergic rhinitis due to pollen: Secondary | ICD-10-CM | POA: Diagnosis not present

## 2019-03-14 DIAGNOSIS — J301 Allergic rhinitis due to pollen: Secondary | ICD-10-CM | POA: Diagnosis not present

## 2019-03-14 DIAGNOSIS — J3089 Other allergic rhinitis: Secondary | ICD-10-CM | POA: Diagnosis not present

## 2019-03-20 DIAGNOSIS — J301 Allergic rhinitis due to pollen: Secondary | ICD-10-CM | POA: Diagnosis not present

## 2019-03-20 DIAGNOSIS — J3089 Other allergic rhinitis: Secondary | ICD-10-CM | POA: Diagnosis not present

## 2019-04-03 DIAGNOSIS — J3089 Other allergic rhinitis: Secondary | ICD-10-CM | POA: Diagnosis not present

## 2019-04-03 DIAGNOSIS — J301 Allergic rhinitis due to pollen: Secondary | ICD-10-CM | POA: Diagnosis not present

## 2019-05-15 DIAGNOSIS — J3089 Other allergic rhinitis: Secondary | ICD-10-CM | POA: Diagnosis not present

## 2019-05-15 DIAGNOSIS — J301 Allergic rhinitis due to pollen: Secondary | ICD-10-CM | POA: Diagnosis not present

## 2019-05-23 ENCOUNTER — Ambulatory Visit: Payer: Medicare Other | Attending: Internal Medicine

## 2019-05-23 DIAGNOSIS — Z23 Encounter for immunization: Secondary | ICD-10-CM

## 2019-05-23 NOTE — Progress Notes (Signed)
   Covid-19 Vaccination Clinic  Name:  VANESA ANTONE    MRN: [id number] DOB: Jun 02, 1935  05/23/2019  Ms. Knauff was observed post Covid-19 immunization for 30 minutes based on pre-vaccination screening without incidence. She was provided with Vaccine Information Sheet and instruction to access the V-Safe system.   Ms. Cihlar was instructed to call 911 with any severe reactions post vaccine: Marland Kitchen Difficulty breathing  . Swelling of your face and throat  . A fast heartbeat  . A bad rash all over your body  . Dizziness and weakness    Immunizations Administered    Name Date Dose VIS Date Route   Pfizer COVID-19 Vaccine 05/23/2019 12:05 PM 0.3 mL 04/11/2019 Intramuscular   Manufacturer: [place]   Lot: [id number]   [place]: [id number]

## 2019-05-29 DIAGNOSIS — J301 Allergic rhinitis due to pollen: Secondary | ICD-10-CM | POA: Diagnosis not present

## 2019-05-29 DIAGNOSIS — J3089 Other allergic rhinitis: Secondary | ICD-10-CM | POA: Diagnosis not present

## 2019-06-05 DIAGNOSIS — J301 Allergic rhinitis due to pollen: Secondary | ICD-10-CM | POA: Diagnosis not present

## 2019-06-05 DIAGNOSIS — J3089 Other allergic rhinitis: Secondary | ICD-10-CM | POA: Diagnosis not present

## 2019-06-13 ENCOUNTER — Ambulatory Visit: Payer: Medicare Other | Attending: Internal Medicine

## 2019-06-13 DIAGNOSIS — Z23 Encounter for immunization: Secondary | ICD-10-CM

## 2019-06-13 NOTE — Progress Notes (Signed)
   Covid-19 Vaccination Clinic  Name:  [name] [name]    MRN: [id number] DOB: 07-01-1935  06/13/2019  [name] [name] was observed post Covid-19 immunization for 30 minutes based on pre-vaccination screening without incidence. She was provided with Vaccine Information Sheet and instruction to access the V-Safe system.   [name] [name] was instructed to call 911 with any severe reactions post vaccine: Marland Kitchen Difficulty breathing  . Swelling of your face and throat  . A fast heartbeat  . A bad rash all over your body  . Dizziness and weakness    Immunizations Administered    Name Date Dose VIS Date Route   Pfizer COVID-19 Vaccine 06/13/2019  8:26 AM 0.3 mL 04/11/2019 Intramuscular   Manufacturer: [place]   Lot: [id number]   [place]: [id number]

## 2019-06-16 DIAGNOSIS — J3089 Other allergic rhinitis: Secondary | ICD-10-CM | POA: Diagnosis not present

## 2019-06-16 DIAGNOSIS — J3081 Allergic rhinitis due to animal (cat) (dog) hair and dander: Secondary | ICD-10-CM | POA: Diagnosis not present

## 2019-06-16 DIAGNOSIS — H1045 Other chronic allergic conjunctivitis: Secondary | ICD-10-CM | POA: Diagnosis not present

## 2019-06-16 DIAGNOSIS — J301 Allergic rhinitis due to pollen: Secondary | ICD-10-CM | POA: Diagnosis not present

## 2019-06-17 ENCOUNTER — Encounter: Payer: Self-pay | Admitting: Obstetrics & Gynecology

## 2019-06-17 DIAGNOSIS — Z1231 Encounter for screening mammogram for malignant neoplasm of breast: Secondary | ICD-10-CM | POA: Diagnosis not present

## 2019-06-26 DIAGNOSIS — J3089 Other allergic rhinitis: Secondary | ICD-10-CM | POA: Diagnosis not present

## 2019-06-26 DIAGNOSIS — J301 Allergic rhinitis due to pollen: Secondary | ICD-10-CM | POA: Diagnosis not present

## 2019-07-01 DIAGNOSIS — Z961 Presence of intraocular lens: Secondary | ICD-10-CM | POA: Diagnosis not present

## 2019-07-01 DIAGNOSIS — H26493 Other secondary cataract, bilateral: Secondary | ICD-10-CM | POA: Diagnosis not present

## 2019-07-01 DIAGNOSIS — H5203 Hypermetropia, bilateral: Secondary | ICD-10-CM | POA: Diagnosis not present

## 2019-07-01 DIAGNOSIS — H52203 Unspecified astigmatism, bilateral: Secondary | ICD-10-CM | POA: Diagnosis not present

## 2019-07-02 DIAGNOSIS — D485 Neoplasm of uncertain behavior of skin: Secondary | ICD-10-CM | POA: Diagnosis not present

## 2019-07-02 DIAGNOSIS — L821 Other seborrheic keratosis: Secondary | ICD-10-CM | POA: Diagnosis not present

## 2019-07-02 DIAGNOSIS — L814 Other melanin hyperpigmentation: Secondary | ICD-10-CM | POA: Diagnosis not present

## 2019-07-02 DIAGNOSIS — D225 Melanocytic nevi of trunk: Secondary | ICD-10-CM | POA: Diagnosis not present

## 2019-07-02 DIAGNOSIS — L570 Actinic keratosis: Secondary | ICD-10-CM | POA: Diagnosis not present

## 2019-07-03 DIAGNOSIS — J301 Allergic rhinitis due to pollen: Secondary | ICD-10-CM | POA: Diagnosis not present

## 2019-07-03 DIAGNOSIS — J3089 Other allergic rhinitis: Secondary | ICD-10-CM | POA: Diagnosis not present

## 2019-07-10 DIAGNOSIS — J301 Allergic rhinitis due to pollen: Secondary | ICD-10-CM | POA: Diagnosis not present

## 2019-07-10 DIAGNOSIS — J3089 Other allergic rhinitis: Secondary | ICD-10-CM | POA: Diagnosis not present

## 2019-07-11 ENCOUNTER — Other Ambulatory Visit: Payer: Self-pay | Admitting: Internal Medicine

## 2019-07-14 NOTE — Telephone Encounter (Signed)
Checked controlled substance database. Last filled 06/06/2019 Last office visit 10/14/2018 Next Office visit: n/s

## 2019-07-15 DIAGNOSIS — M25512 Pain in left shoulder: Secondary | ICD-10-CM | POA: Diagnosis not present

## 2019-07-15 DIAGNOSIS — M7542 Impingement syndrome of left shoulder: Secondary | ICD-10-CM | POA: Diagnosis not present

## 2019-07-17 DIAGNOSIS — J3089 Other allergic rhinitis: Secondary | ICD-10-CM | POA: Diagnosis not present

## 2019-07-17 DIAGNOSIS — J301 Allergic rhinitis due to pollen: Secondary | ICD-10-CM | POA: Diagnosis not present

## 2019-07-24 DIAGNOSIS — J3089 Other allergic rhinitis: Secondary | ICD-10-CM | POA: Diagnosis not present

## 2019-07-24 DIAGNOSIS — J301 Allergic rhinitis due to pollen: Secondary | ICD-10-CM | POA: Diagnosis not present

## 2019-07-28 ENCOUNTER — Telehealth: Payer: Self-pay | Admitting: Internal Medicine

## 2019-07-28 DIAGNOSIS — J4520 Mild intermittent asthma, uncomplicated: Secondary | ICD-10-CM

## 2019-07-28 NOTE — Progress Notes (Signed)
  Chronic Care Management   Outreach Note  07/28/2019 Name: TERRE LESAR MRN: [id number] DOB: [birth year]  Referred by: Hoyt Koch, MD Reason for referral : No chief complaint on file.   An unsuccessful telephone outreach was attempted today. The patient was referred to the pharmacist for assistance with care management and care coordination.   Follow Up Plan:   Raynicia Dukes [employer] Scheduler

## 2019-07-28 NOTE — Chronic Care Management (AMB) (Signed)
°  Chronic Care Management   Note  07/28/2019 Name: KAYELEE ABID MRN: [id number] DOB: 1935-12-26  LURLEEN FEAR is a 84 y.o. year old female who is a primary care patient of Hoyt Koch, MD. I reached out to Yetta Numbers by phone today in response to a referral sent by Ms. Victorino Sparrow Hulse's PCP, Hoyt Koch, MD.   Ms. Hamre was given information about Chronic Care Management services today including:  1. CCM service includes personalized support from designated clinical staff supervised by her physician, including individualized plan of care and coordination with other care providers 2. 24/7 contact phone numbers for assistance for urgent and routine care needs. 3. Service will only be billed when office clinical staff spend 20 minutes or more in a month to coordinate care. 4. Only one practitioner may furnish and bill the service in a calendar month. 5. The patient may stop CCM services at any time (effective at the end of the month) by phone call to the office staff.   Patient agreed to services and verbal consent obtained.   Follow up plan:   Raynicia Dukes [employer] Scheduler

## 2019-07-31 DIAGNOSIS — J301 Allergic rhinitis due to pollen: Secondary | ICD-10-CM | POA: Diagnosis not present

## 2019-07-31 DIAGNOSIS — J3089 Other allergic rhinitis: Secondary | ICD-10-CM | POA: Diagnosis not present

## 2019-08-07 DIAGNOSIS — J301 Allergic rhinitis due to pollen: Secondary | ICD-10-CM | POA: Diagnosis not present

## 2019-08-07 DIAGNOSIS — J3089 Other allergic rhinitis: Secondary | ICD-10-CM | POA: Diagnosis not present

## 2019-08-14 DIAGNOSIS — J301 Allergic rhinitis due to pollen: Secondary | ICD-10-CM | POA: Diagnosis not present

## 2019-08-14 DIAGNOSIS — J3089 Other allergic rhinitis: Secondary | ICD-10-CM | POA: Diagnosis not present

## 2019-08-20 ENCOUNTER — Other Ambulatory Visit: Payer: Self-pay

## 2019-08-20 ENCOUNTER — Ambulatory Visit: Payer: Medicare Other | Admitting: Pharmacist

## 2019-08-20 DIAGNOSIS — M8589 Other specified disorders of bone density and structure, multiple sites: Secondary | ICD-10-CM

## 2019-08-20 DIAGNOSIS — I10 Essential (primary) hypertension: Secondary | ICD-10-CM

## 2019-08-20 DIAGNOSIS — F411 Generalized anxiety disorder: Secondary | ICD-10-CM

## 2019-08-20 NOTE — Chronic Care Management (AMB) (Signed)
Chronic Care Management Pharmacy  Name: [name] [name]  MRN: [id number] DOB: 11-08-1935   Chief Complaint/ HPI  [name] [name],  84 y.o. , female presents for their Initial CCM visit with the clinical pharmacist via telephone due to COVID-19 Pandemic.  PCP : Hoyt Koch, MD  Their chronic conditions include: HTN, asthma, allergies, osteopenia, anxiety, coronary atherosclerosis  Born in [place], attended "[employer]", moved to [place] when she got married - married for 98 years, husband died 4 years ago pancreatic cancer. Lives alone now. 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 Just started back last week after being away during pandemic. Lives in [place], part of book club. 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 Likes to walk.   Office Visits: 10/14/18 Dr Sharlet Salina OV: stable on amlodipine and trazodone, taking more alprazolam than usual lately, reminded that #70 is 30 day supply.  Consult Visit: Dr Harold Hedge (allergy/imm): via claims  01/28/19 Dr Percival Spanish (cardiology): c/o palpitations, switched amlodipine to diltiazem.   Medications: Outpatient Encounter Medications as of 08/20/2019  Medication Sig  . ALPRAZolam (XANAX) 0.5 MG tablet TAKE 1/2 TO 1 TABLET THREE TIMES DAILY AS NEEDED.  Marland Kitchen aspirin 81 MG tablet Take 81 mg by mouth daily.    Marland Kitchen BIOTIN 5000 PO Take by mouth 2 (two) times daily.   . calcium carbonate (CALCIUM 600) 600 MG TABS Take 600 mg by mouth daily.   . cetirizine (ZYRTEC) 10 MG tablet Take 10 mg by mouth daily.    . Cholecalciferol (VITAMIN D) 2000 units tablet Take 2,000 Units by mouth daily.  Marland Kitchen diltiazem (CARDIZEM CD) 120 MG 24 hr capsule Take 1 capsule (120 mg total) by mouth daily.  Marland Kitchen EPINEPHrine (EPI-PEN) 0.3 mg/0.3 mL DEVI Inject 0.3 mg into the muscle as needed.  . fish oil-omega-3 fatty acids 1000 MG capsule Take 1 g by mouth daily.    . folic acid (FOLVITE)  Q000111Q MCG tablet Take 800 mcg by mouth daily.  . Multiple Vitamins-Minerals (PROTEGRA PO) Take by mouth daily.    . nitroGLYCERIN (NITROSTAT) 0.4 MG SL tablet Place 1 tablet (0.4 mg total) under the tongue every 5 (five) minutes as needed. May repeat 3 times  . NONFORMULARY OR COMPOUNDED ITEM Estradiol 0.02% cream 1/4 ml twice weekly  . Pyridoxine HCl (VITAMIN B6 PO) Take by mouth.  . traZODone (DESYREL) 100 MG tablet Take 1 tablet (100 mg total) by mouth at bedtime.  . TURMERIC PO Take 1,500 mg by mouth daily.  Marland Kitchen UNABLE TO FIND Allergy shots weekly   No facility-administered encounter medications on file as of 08/20/2019.   DEXA scan results 02/21/19 (Dr Renne Crigler) Results: Lumbar spine L1-L4 Femoral neck (FN) 33% distal radius  T-score -1.7 RFN: -1.3 LFN: -1.6 n/a  Change in BMD from previous DXA test (%) +2.7% -2.0% n/a  (*) statistically significant  Assessment: the BMD is low according to the [facility] classification for osteoporosis (see below). Fracture risk: moderate FRAX score: 10 year major osteoporotic risk: 11.6%. 10 year hip fracture risk: 3.3%. The thresholds for treatment are 20% and 3%, respectively. Comments: the technical quality of the study is good. Evaluation for secondary causes should be considered if clinically indicated.  Recommend optimizing calcium (1200 mg/day) and vitamin D (800 IU/day) intake.  Followup: Repeat BMD is appropriate after 2 years or after 1-2 years if starting treatment.   Current Diagnosis/Assessment:  Goals Addressed   None  Hypertension   Office blood pressues are r BP Readings from Last 3 Encounters:  01/28/19 136/70  11/19/18 (!) 144/86  10/14/18 136/86   Kidney Function Lab Results  Component Value Date   CREATININE 0.68 10/14/2018   CREATININE 0.62 08/21/2017   CREATININE 0.69 09/11/2016      Component Value Date/Time   GFR 82.96 10/14/2018 0831   GFRNONAA 106.38 09/28/2009 1547   GFRAA 106 06/16/2008 0905    Patient  has failed these meds in the past: amlodipine Patient is currently controlled on the following medications: diltiazem 120 mg daily  Patient checks BP at home infrequently  Patient home BP readings are ranging: n/a  We discussed diet and exercise extensively  Weight lifting, walking several times per week.  Tries to eat healthy. Compliant with meds.  Plan  Continue current medications and control with diet and exercise    Coronary atherosclerosis   Lipid Panel     Component Value Date/Time   CHOL 176 10/14/2018 0831   TRIG 145.0 10/14/2018 0831   HDL 86.40 10/14/2018 0831   CHOLHDL 2 10/14/2018 0831   VLDL 29.0 10/14/2018 0831   LDLCALC 60 10/14/2018 0831   LDLDIRECT 79.4 06/29/2010 0803    The ASCVD Risk score (Goff DC Jr., et al., 2013) failed to calculate for the following reasons:   The 2013 ASCVD risk score is only valid for ages 44 to 72   Patient has failed these meds in past: n/a Patient is currently controlled on the following medications: aspirin 81 mg daily, nitroglycerin 0.4 mg SL prn, OTC fish oil  We discussed:  diet and exercise extensively Mainly eats chicken, fish, fruit, vegetables. Discussed benefits of maintaining LDL at goal.   Plan  Continue current medications and control with diet and exercise   Anxiety/Insomnia   Patient has failed these meds in past: n/a Patient is currently controlled on the following medications: alprazolam 0.5 mg - 1/2 tab TID prn, trazodone 100 mg HS  We discussed:  Pt reports she has been on Xanax since 1986 - takes 1/2 AM and 1/2 PM and this control anxiety well.   Plan  Continue current medications   Osteopenia   Patient has failed these meds in past: n/a Patient is currently controlled on the following medications: calcium 600 mg, Vitamin D 2000 IU  We discussed:  Benefits of calcium/Vitamin D for maintaining bone health. Discussed most recent DEXA scan actually showed improvement from previous scan.  Encouraged continued exercise.  Plan  Continue current medications   Allergies   Patient has failed these meds in past: n/a Patient is currently controlled on the following medications: Flonase prn, cetirizine 10 mg HS, Epi Pen, allergy shots weekly  We discussed:  Has not had to use Epi Pen but does carry it with her.  Plan  Continue current medications   Health Maintenance   Patient is currently controlled on the following medications: Biotin, folic acid, multivitamin, Vitamin B6, turmeric  We discussed:  Patient is satisfied with current OTC regimen and denies issues.   Plan  Continue current medications   Medication Management   Pt uses [facility] for all medications Does not use pill box  Pt endorses 100% compliance  We discussed: Patient reports compliance with meds as described, denies missed doses, denies issues with pharmacy.  Plan  Continue current medication management strategy     Follow up: 6 month phone visit  Charlene Brooke, PharmD Clinical Pharmacist [facility]  [address] [phone]

## 2019-08-20 NOTE — Patient Instructions (Signed)
Visit Information  Thank you for meeting with me to discuss your medications! I look forward to working with you to achieve your health care goals. Below is a summary of what we talked about during the visit:  Goals Addressed            This Visit's Progress   . Anxiety       CARE PLAN ENTRY  Current Barriers:  . Chronic Disease Management support, education, and care coordination needs related to anxiety and insomnia  Pharmacist Clinical Goal(s):  [name] Kitchen Over the next 180 days, patient will work with PharmD and providers towards optimized anxiety therapy  Interventions: . Comprehensive medication review performed. . Discussed safety of long term therapy with benzodiazepine  Patient Self Care Activities:  . Patient will take alprazolam and trazodone as directed . Patient will contact providers with new issues or concerns  Initial goal documentation    . Hypertension: goal BP < 140/90       CARE PLAN ENTRY (see longitudinal plan of care for additional care plan information)  Current Barriers:  . controlled hypertension, complicated by atherosclerosis, anxiety . Current antihypertensive regimen: diltiazem CD 120 mg daily . Previous antihypertensives tried: amlodipine . Last practice recorded BP readings:  BP Readings from Last 3 Encounters:  01/28/19 136/70  11/19/18 (!) 144/86  10/14/18 136/86   . Current home BP readings: not checking Kidney Function Lab Results  Component Value Date   CREATININE 0.68 10/14/2018   CREATININE 0.62 08/21/2017   CREATININE 0.69 09/11/2016      Component Value Date/Time   GFR 82.96 10/14/2018 0831   GFRNONAA 106.38 09/28/2009 1547   GFRAA 106 06/16/2008 0905    Pharmacist Clinical Goal(s):  [name] Kitchen Over the next 180 days, patient will work with PharmD and providers to optimize antihypertensive regimen  Interventions: . Inter-disciplinary care team collaboration (see longitudinal plan of care) . Comprehensive medication review performed;  medication list updated in the electronic medical record.  . Discussed blood pressure goals and overall cardiovascular risk reduction  Patient Self Care Activities:  . Patient will focus on medication adherence by fill date . Patient will continue to eat healthy diet and exercise  Initial goal documentation    . Osteoporosis: prevent fractures       CARE PLAN ENTRY  Current Barriers:  . Chronic Disease Management support, education, and care coordination needs related to osteoporosis  Pharmacist Clinical Goal(s):  [name] Kitchen Over the next 180 days, patient will work with PharmD and providers towards optimized osteoporosis therapy  Interventions: . Comprehensive medication review performed. . Counseled on calcium and vitamin D supplementation for bone health . Counseled on monitoring with DEXA scan every 2-3 years  Patient Self Care Activities:  . Patient will continue calcium 600 mg and Vitamin D 2000 IU daily . Patient will follow up with providers regarding future DEXA scans . Patient will contact providers with new issues or concerns  Initial goal documentation       [name] [name] was given information about Chronic Care Management services today including:  1. CCM service includes personalized support from designated clinical staff supervised by her physician, including individualized plan of care and coordination with other care providers 2. 24/7 contact phone numbers for assistance for urgent and routine care needs. 3. Standard insurance, coinsurance, copays and deductibles apply for chronic care management only during months in which we provide at least 20 minutes of these services. Most insurances cover these services at 100%, however patients may be  responsible for any copay, coinsurance and/or deductible if applicable. This service may help you avoid the need for more expensive face-to-face services. 4. Only one practitioner may furnish and bill the service in a calendar month. 5.  The patient may stop CCM services at any time (effective at the end of the month) by phone call to the office staff.  Patient agreed to services and verbal consent obtained.   The patient verbalized understanding of instructions provided today and agreed to receive a mailed copy of patient instruction and/or educational materials. Telephone follow up appointment with pharmacy team member scheduled for: 6 months  Charlene Brooke, PharmD Clinical Pharmacist [facility] [phone]  Heart-Healthy Eating Plan Many factors influence your heart (coronary) health, including eating and exercise habits. Coronary risk increases with abnormal blood fat (lipid) levels. Heart-healthy meal planning includes limiting unhealthy fats, increasing healthy fats, and making other diet and lifestyle changes.  What are tips for following this plan? Cooking Cook foods using methods other than frying. Baking, boiling, grilling, and broiling are all good options. Other ways to reduce fat include:  Removing the skin from poultry.  Removing all visible fats from meats.  Steaming vegetables in water or broth. Meal planning   At meals, imagine dividing your plate into fourths: ? Fill one-half of your plate with vegetables and green salads. ? Fill one-fourth of your plate with whole grains. ? Fill one-fourth of your plate with lean protein foods.  Eat 4-5 servings of vegetables per day. One serving equals 1 cup raw or cooked vegetable, or 2 cups raw leafy greens.  Eat 4-5 servings of fruit per day. One serving equals 1 medium whole fruit,  cup dried fruit,  cup fresh, frozen, or canned fruit, or  cup 100% fruit juice.  Eat more foods that contain soluble fiber. Examples include apples, broccoli, carrots, beans, peas, and barley. Aim to get 25-30 g of fiber per day.  Increase your consumption of legumes, nuts, and seeds to 4-5 servings per week. One serving of dried beans or  legumes equals  cup cooked, 1 serving of nuts is  cup, and 1 serving of seeds equals 1 tablespoon. Fats  Choose healthy fats more often. Choose monounsaturated and polyunsaturated fats, such as olive and canola oils, flaxseeds, walnuts, almonds, and seeds.  Eat more omega-3 fats. Choose salmon, mackerel, sardines, tuna, flaxseed oil, and ground flaxseeds. Aim to eat fish at least 2 times each week.  Check food labels carefully to identify foods with trans fats or high amounts of saturated fat.  Limit saturated fats. These are found in animal products, such as meats, butter, and cream. Plant sources of saturated fats include palm oil, palm kernel oil, and coconut oil.  Avoid foods with partially hydrogenated oils in them. These contain trans fats. Examples are stick margarine, some tub margarines, cookies, crackers, and other baked goods.  Avoid fried foods. General information  Eat more home-cooked food and less restaurant, buffet, and fast food.  Limit or avoid alcohol.  Limit foods that are high in starch and sugar.  Lose weight if you are overweight. Losing just 5-10% of your body weight can help your overall health and prevent diseases such as diabetes and heart disease.  Monitor your salt (sodium) intake, especially if you have high blood pressure. Talk with your health care provider about your sodium intake.  Try to incorporate more vegetarian meals weekly. What foods can I eat? Fruits All fresh, canned (in natural juice),  or frozen fruits. Vegetables Fresh or frozen vegetables (raw, steamed, roasted, or grilled). Green salads. Grains Most grains. Choose whole wheat and whole grains most of the time. Rice and pasta, including brown rice and pastas made with whole wheat. Meats and other proteins Lean, well-trimmed beef, veal, pork, and lamb. Chicken and [place] without skin. All fish and shellfish. Wild duck, rabbit, pheasant, and venison. Egg whites or low-cholesterol egg  substitutes. Dried beans, peas, lentils, and tofu. Seeds and most nuts. Dairy Low-fat or nonfat cheeses, including ricotta and mozzarella. Skim or 1% milk (liquid, powdered, or evaporated). Buttermilk made with low-fat milk. Nonfat or low-fat yogurt. Fats and oils Non-hydrogenated (trans-free) margarines. Vegetable oils, including soybean, sesame, sunflower, olive, peanut, safflower, corn, canola, and cottonseed. Salad dressings or mayonnaise made with a vegetable oil. Beverages Water (mineral or sparkling). Coffee and tea. Diet carbonated beverages. Sweets and desserts Sherbet, gelatin, and fruit ice. Small amounts of dark chocolate. Limit all sweets and desserts. Seasonings and condiments All seasonings and condiments. The items listed above may not be a complete list of foods and beverages you can eat. Contact a dietitian for more options. What foods are not recommended? Fruits Canned fruit in heavy syrup. Fruit in cream or butter sauce. Fried fruit. Limit coconut. Vegetables Vegetables cooked in cheese, cream, or butter sauce. Fried vegetables. Grains Breads made with saturated or trans fats, oils, or whole milk. Croissants. Sweet rolls. Donuts. High-fat crackers, such as cheese crackers. Meats and other proteins Fatty meats, such as hot dogs, ribs, sausage, bacon, rib-eye roast or steak. High-fat deli meats, such as salami and bologna. Caviar. Domestic duck and goose. Organ meats, such as liver. Dairy Cream, sour cream, cream cheese, and creamed cottage cheese. Whole milk cheeses. Whole or 2% milk (liquid, evaporated, or condensed). Whole buttermilk. Cream sauce or high-fat cheese sauce. Whole-milk yogurt. Fats and oils Meat fat, or shortening. Cocoa butter, hydrogenated oils, palm oil, coconut oil, palm kernel oil. Solid fats and shortenings, including bacon fat, salt pork, lard, and butter. Nondairy cream substitutes. Salad dressings with cheese or sour cream. Beverages Regular  sodas and any drinks with added sugar. Sweets and desserts Frosting. Pudding. Cookies. Cakes. Pies. Milk chocolate or white chocolate. Buttered syrups. Full-fat ice cream or ice cream drinks. The items listed above may not be a complete list of foods and beverages to avoid. Contact a dietitian for more information. Summary  Heart-healthy meal planning includes limiting unhealthy fats, increasing healthy fats, and making other diet and lifestyle changes.  Lose weight if you are overweight. Losing just 5-10% of your body weight can help your overall health and prevent diseases such as diabetes and heart disease.  Focus on eating a balance of foods, including fruits and vegetables, low-fat or nonfat dairy, lean protein, nuts and legumes, whole grains, and heart-healthy oils and fats. This information is not intended to replace advice given to you by your health care provider. Make sure you discuss any questions you have with your health care provider. Document Revised: 05/25/2017 Document Reviewed: 05/25/2017 Elsevier Patient Education  2020 [employer].

## 2019-08-21 DIAGNOSIS — J301 Allergic rhinitis due to pollen: Secondary | ICD-10-CM | POA: Diagnosis not present

## 2019-08-21 DIAGNOSIS — J3089 Other allergic rhinitis: Secondary | ICD-10-CM | POA: Diagnosis not present

## 2019-08-21 DIAGNOSIS — J3081 Allergic rhinitis due to animal (cat) (dog) hair and dander: Secondary | ICD-10-CM | POA: Diagnosis not present

## 2019-08-22 NOTE — Addendum Note (Signed)
Addended by: Karle Barr on: 08/22/2019 09:10 AM   Modules accepted: Orders

## 2019-08-28 DIAGNOSIS — J301 Allergic rhinitis due to pollen: Secondary | ICD-10-CM | POA: Diagnosis not present

## 2019-08-28 DIAGNOSIS — J3089 Other allergic rhinitis: Secondary | ICD-10-CM | POA: Diagnosis not present

## 2019-09-04 DIAGNOSIS — J301 Allergic rhinitis due to pollen: Secondary | ICD-10-CM | POA: Diagnosis not present

## 2019-09-04 DIAGNOSIS — J3089 Other allergic rhinitis: Secondary | ICD-10-CM | POA: Diagnosis not present

## 2019-09-11 DIAGNOSIS — J3089 Other allergic rhinitis: Secondary | ICD-10-CM | POA: Diagnosis not present

## 2019-09-11 DIAGNOSIS — J301 Allergic rhinitis due to pollen: Secondary | ICD-10-CM | POA: Diagnosis not present

## 2019-09-18 DIAGNOSIS — J301 Allergic rhinitis due to pollen: Secondary | ICD-10-CM | POA: Diagnosis not present

## 2019-09-18 DIAGNOSIS — J3089 Other allergic rhinitis: Secondary | ICD-10-CM | POA: Diagnosis not present

## 2019-09-25 DIAGNOSIS — J3089 Other allergic rhinitis: Secondary | ICD-10-CM | POA: Diagnosis not present

## 2019-09-25 DIAGNOSIS — J301 Allergic rhinitis due to pollen: Secondary | ICD-10-CM | POA: Diagnosis not present

## 2019-10-02 DIAGNOSIS — J3089 Other allergic rhinitis: Secondary | ICD-10-CM | POA: Diagnosis not present

## 2019-10-02 DIAGNOSIS — J301 Allergic rhinitis due to pollen: Secondary | ICD-10-CM | POA: Diagnosis not present

## 2019-10-09 DIAGNOSIS — J301 Allergic rhinitis due to pollen: Secondary | ICD-10-CM | POA: Diagnosis not present

## 2019-10-09 DIAGNOSIS — J3089 Other allergic rhinitis: Secondary | ICD-10-CM | POA: Diagnosis not present

## 2019-10-14 ENCOUNTER — Other Ambulatory Visit: Payer: Self-pay

## 2019-10-14 ENCOUNTER — Encounter: Payer: Self-pay | Admitting: Obstetrics & Gynecology

## 2019-10-14 ENCOUNTER — Ambulatory Visit (INDEPENDENT_AMBULATORY_CARE_PROVIDER_SITE_OTHER): Payer: Medicare Other | Admitting: Obstetrics & Gynecology

## 2019-10-14 VITALS — BP 118/76

## 2019-10-14 DIAGNOSIS — M545 Low back pain, unspecified: Secondary | ICD-10-CM

## 2019-10-14 DIAGNOSIS — R8271 Bacteriuria: Secondary | ICD-10-CM

## 2019-10-14 DIAGNOSIS — N949 Unspecified condition associated with female genital organs and menstrual cycle: Secondary | ICD-10-CM

## 2019-10-14 LAB — WET PREP FOR TRICH, YEAST, CLUE

## 2019-10-14 MED ORDER — SULFAMETHOXAZOLE-TRIMETHOPRIM 800-160 MG PO TABS: 1.0000 | ORAL_TABLET | Freq: Two times a day (BID) | ORAL | 0 refills | Status: AC

## 2019-10-14 NOTE — Progress Notes (Signed)
    [name] [name] Jul 29, 1935 [id number]        84 y.o.  G1P1001   RP: Vulvar burning not improved after Monistat  HPI: Vulvar burning persisting after Monistat treatment.  No clear symptom of bladder infection.  Midline low back pain.  Some frequency and burning.  No blood in urine.  No fever.   OB History  Gravida Para Term Preterm AB Living  1 1 1    0 1  SAB TAB Ectopic Multiple Live Births               # Outcome Date GA Lbr Len/2nd Weight Sex Delivery Anes PTL Lv  1 Term             Past medical history,surgical history, problem list, medications, allergies, family history and social history were all reviewed and documented in the EPIC chart.   Directed ROS with pertinent positives and negatives documented in the history of present illness/assessment and plan.  Exam:  Vitals:   10/14/19 1513  BP: 118/76   General appearance:  Normal  Abdomen: Soft, NT.  Gynecologic exam: Vulva normal.  Speculum:  Cervix/Vagina normal.  No vaginal d/c.  Wet prep done.  Bimanual exam:  Uterus AV, normal volume, NT, mobile.  No adnexal mass/NT.  Wet prep Negative U/A: Yellow clear, nitrites negative, proteins negative, white blood cells 10-20, red blood cells negative, bacteria few.  Urine culture pending.   Assessment/Plan:  84 y.o. G1P1001   1. Vaginal burning Wet prep negative, vulva normal, patient reassured. - WET PREP FOR [place], YEAST, CLUE  2. Midline low back pain without sciatica, unspecified chronicity U/A with increased WBCs and few bacteria.  Decision to treat with Bactrim DS 1 tablet twice a day for 3 days.  Usage reviewed and prescription sent to pharmacy.  Pending urine culture. - Urinalysis,Complete w/RFL Culture  Other orders - sulfamethoxazole-trimethoprim (BACTRIM DS) 800-160 MG tablet; Take 1 tablet by mouth 2 (two) times daily for 3 days.  Princess Bruins MD, 3:16 PM 10/14/2019

## 2019-10-15 LAB — URINE CULTURE
MICRO NUMBER:: 10592927
Result:: NO GROWTH
SPECIMEN QUALITY:: ADEQUATE

## 2019-10-15 LAB — CULTURE INDICATED

## 2019-10-15 LAB — URINALYSIS, COMPLETE W/RFL CULTURE
Bilirubin Urine: NEGATIVE
Glucose, UA: NEGATIVE
Hgb urine dipstick: NEGATIVE
Hyaline Cast: NONE SEEN /LPF
Ketones, ur: NEGATIVE
Nitrites, Initial: NEGATIVE
Protein, ur: NEGATIVE
RBC / HPF: NONE SEEN /HPF (ref 0–2)
Specific Gravity, Urine: 1.015 (ref 1.001–1.03)
pH: 7.5 (ref 5.0–8.0)

## 2019-10-16 ENCOUNTER — Telehealth: Payer: Self-pay | Admitting: *Deleted

## 2019-10-16 NOTE — Telephone Encounter (Signed)
Patient informed with negative urine culture, on last pill of Bactrim DS and reports still has vaginal burning. Patient said vaginal burning is constant. She asked for advice about what to do next since culture is negative. Please advise

## 2019-10-17 ENCOUNTER — Other Ambulatory Visit: Payer: Self-pay

## 2019-10-17 ENCOUNTER — Ambulatory Visit: Payer: Medicare Other | Admitting: Obstetrics and Gynecology

## 2019-10-17 MED ORDER — CLOBETASOL PROPIONATE 0.05 % EX CREA: 1.0000 "application " | TOPICAL_CREAM | Freq: Two times a day (BID) | CUTANEOUS | 0 refills | Status: DC

## 2019-10-17 NOTE — Telephone Encounter (Signed)
I spoke with patient and advised. Rx sent. She had scheduled appt with JK today at 1pm and she asked me to cancel it for her. I did.

## 2019-10-17 NOTE — Telephone Encounter (Signed)
Wet prep negative as well.  Please prescribe Clobetasol cream 0.97%, a thin application where feeling the burning twice a day x 2 weeks.

## 2019-10-20 ENCOUNTER — Encounter: Payer: Self-pay | Admitting: Obstetrics & Gynecology

## 2019-10-20 NOTE — Patient Instructions (Signed)
1. Vaginal burning Wet prep negative, vulva normal, patient reassured. - WET PREP FOR [name] [name], YEAST, CLUE  2. Midline low back pain without sciatica, unspecified chronicity U/A with increased WBCs and few bacteria.  Decision to treat with Bactrim DS 1 tablet twice a day for 3 days.  Usage reviewed and prescription sent to pharmacy.  Pending urine culture. - Urinalysis,Complete w/RFL Culture  Other orders - sulfamethoxazole-trimethoprim (BACTRIM DS) 800-160 MG tablet; Take 1 tablet by mouth 2 (two) times daily for 3 days.  [name] [name], it was a pleasure seeing you today!  I will inform you of your results as soon as they are available.

## 2019-10-21 ENCOUNTER — Other Ambulatory Visit: Payer: Self-pay

## 2019-10-21 ENCOUNTER — Encounter: Payer: Self-pay | Admitting: Internal Medicine

## 2019-10-21 ENCOUNTER — Ambulatory Visit (INDEPENDENT_AMBULATORY_CARE_PROVIDER_SITE_OTHER): Payer: Medicare Other | Admitting: Internal Medicine

## 2019-10-21 VITALS — BP 126/84 | HR 94 | Temp 98.2°F | Ht 63.0 in | Wt 108.0 lb

## 2019-10-21 DIAGNOSIS — F411 Generalized anxiety disorder: Secondary | ICD-10-CM | POA: Diagnosis not present

## 2019-10-21 DIAGNOSIS — Z0000 Encounter for general adult medical examination without abnormal findings: Secondary | ICD-10-CM | POA: Diagnosis not present

## 2019-10-21 DIAGNOSIS — F5101 Primary insomnia: Secondary | ICD-10-CM | POA: Diagnosis not present

## 2019-10-21 DIAGNOSIS — I10 Essential (primary) hypertension: Secondary | ICD-10-CM | POA: Diagnosis not present

## 2019-10-21 LAB — LIPID PANEL
Cholesterol: 190 mg/dL (ref 0–200)
HDL: 87.2 mg/dL (ref >39.00)
LDL Cholesterol: 88 mg/dL (ref 0–99)
NonHDL: 102.77
Total CHOL/HDL Ratio: 2
Triglycerides: 76 mg/dL (ref 0.0–149.0)
VLDL: 15.2 mg/dL (ref 0.0–40.0)

## 2019-10-21 LAB — COMPREHENSIVE METABOLIC PANEL
ALT: 24 U/L (ref 0–35)
AST: 26 U/L (ref 0–37)
Albumin: 4.8 g/dL (ref 3.5–5.2)
Alkaline Phosphatase: 92 U/L (ref 39–117)
BUN: 10 mg/dL (ref 6–23)
CO2: 29 mEq/L (ref 19–32)
Calcium: 10.1 mg/dL (ref 8.4–10.5)
Chloride: 97 mEq/L (ref 96–112)
Creatinine, Ser: 0.71 mg/dL (ref 0.40–1.20)
GFR: 78.73 mL/min (ref >60.00)
Glucose, Bld: 100 mg/dL — ABNORMAL HIGH (ref 70–99)
Potassium: 4.2 mEq/L (ref 3.5–5.1)
Sodium: 133 mEq/L — ABNORMAL LOW (ref 135–145)
Total Bilirubin: 0.6 mg/dL (ref 0.2–1.2)
Total Protein: 7.7 g/dL (ref 6.0–8.3)

## 2019-10-21 LAB — CBC
HCT: 43.5 % (ref 36.0–46.0)
Hemoglobin: 14.9 g/dL (ref 12.0–15.0)
MCHC: 34.3 g/dL (ref 30.0–36.0)
MCV: 103.2 fl — ABNORMAL HIGH (ref 78.0–100.0)
Platelets: 265 10*3/uL (ref 150.0–400.0)
RBC: 4.21 Mil/uL (ref 3.87–5.11)
RDW: 12.7 % (ref 11.5–15.5)
WBC: 6.1 10*3/uL (ref 4.0–10.5)

## 2019-10-21 MED ORDER — ALPRAZOLAM 0.5 MG PO TABS: 0.5000 mg | ORAL_TABLET | Freq: Three times a day (TID) | ORAL | 5 refills | Status: DC | PRN

## 2019-10-21 MED ORDER — TRAZODONE HCL 100 MG PO TABS: 100.0000 mg | ORAL_TABLET | Freq: Every day | ORAL | 3 refills | Status: DC

## 2019-10-21 NOTE — Assessment & Plan Note (Signed)
Using trazodone and alprazolam with good success, reminded about long term benefit/risk and she wishes to continue with treatment. Medications refilled today.

## 2019-10-21 NOTE — Assessment & Plan Note (Signed)
BP at goal on diltiazem, checking CMP and adjust as needed.

## 2019-10-21 NOTE — Assessment & Plan Note (Signed)
Flu shot due yearly. Covid-19 up to date. Pneumonia complete. Shingrix counseled. Tetanus due 2030. Colonoscopy aged out. Mammogram aged out, pap smear aged out and dexa due 2022. Counseled about sun safety and mole surveillance. Counseled about the dangers of distracted driving. Given 10 year screening recommendations.

## 2019-10-21 NOTE — Assessment & Plan Note (Signed)
Doing well overall with xanax, reminded about risks and benefits from treatment. She wishes to continue. Refilled today.

## 2019-10-21 NOTE — Progress Notes (Signed)
Subjective:   Patient ID: [name] [name], female    DOB: May 26, 1935, 84 y.o.   MRN: [id number]  HPI Here for medicare wellness, no new complaints. Please see A/P for status and treatment of chronic medical problems.   HPI #2: Here for follow up blood pressure (taking diltiazem daily, denies side effects, denies headaches or chest pains, walking daily) and anxiety (using alprazolam and denies side effects, denies depression or breakthrough anxiety, wants to keep treatment the same) and insomnia (using trazodone and alprazolam with good results, overall sleeping better, denies side effects, denies falls or memory concerns).   Diet: heart healthy Physical activity: sedentary, walks daily Depression/mood screen: negative Hearing: intact to whispered voice, moderate loss bilaterally has hearing aids not wearing them lately Visual acuity: grossly normal with lens, performs annual eye exam  ADLs: capable Fall risk: none Home safety: good Cognitive evaluation: intact to orientation, naming, recall and repetition EOL planning: adv directives discussed    Office Visit from 10/21/2019 in [place] at [facility] Total Score 0      I have personally reviewed and have noted 1. The patient's medical and social history - reviewed today no changes 2. Their use of alcohol, tobacco or illicit drugs 3. Their current medications and supplements 4. The patient's functional ability including ADL's, fall risks, home safety risks and hearing or visual impairment. 5. Diet and physical activities 6. Evidence for depression or mood disorders 7. Care team reviewed and updated  Patient Care Team: Hoyt Koch, MD as PCP - General (Internal Medicine) Charlton Haws, [facility] as Pharmacist (Pharmacist) Past Medical History:  Diagnosis Date  . Anxiety   . Coronary artery spasm (HCC)    Non obstructive CAD  . Diverticulosis of colon   . Fibromyalgia   . Hx of colonic polyps     . Lumbar back pain   . MVP (mitral valve prolapse)    No antibiotics required for procedures  . Osteoporosis   . Palpitations   . Pneumonia   . Shingles    Past Surgical History:  Procedure Laterality Date  . CATARACT EXTRACTION, BILATERAL    . CATARACT EXTRACTION, BILATERAL  05/2018  . CHOLECYSTECTOMY  03/2003  . LUMBAR LAMINECTOMY    . TONSILLECTOMY     as a child   Family History  Problem Relation Age of Onset  . Rheum arthritis Mother   . Stroke Mother   . Hypertension Mother   . Heart failure Father   . Heart disease Father   . Asthma Maternal Grandfather   . Allergies Maternal Grandfather   . COPD Maternal Aunt         2 aunts  . Hypertension Brother    Review of Systems  Constitutional: Negative.   HENT: Negative.   Eyes: Negative.   Respiratory: Negative for cough, chest tightness and shortness of breath.   Cardiovascular: Negative for chest pain, palpitations and leg swelling.  Gastrointestinal: Negative for abdominal distention, abdominal pain, constipation, diarrhea, nausea and vomiting.  Musculoskeletal: Negative.   Skin: Negative.   Neurological: Negative.   Psychiatric/Behavioral: Negative.     Objective:  Physical Exam Constitutional:      Appearance: She is well-developed.  HENT:     Head: Normocephalic and atraumatic.  Cardiovascular:     Rate and Rhythm: Normal rate and regular rhythm.  Pulmonary:     Effort: Pulmonary effort is normal. No respiratory distress.     Breath sounds: Normal breath sounds.  No wheezing or rales.  Abdominal:     General: Bowel sounds are normal. There is no distension.     Palpations: Abdomen is soft.     Tenderness: There is no abdominal tenderness. There is no rebound.  Musculoskeletal:     Cervical back: Normal range of motion.  Skin:    General: Skin is warm and dry.  Neurological:     Mental Status: She is alert and oriented to person, place, and time.     Coordination: Coordination normal.      Vitals:   10/21/19 0942  BP: 126/84  Pulse: 94  Temp: 98.2 F (36.8 C)  TempSrc: Oral  SpO2: 97%  Weight: 108 lb (49 kg)  Height: 5\' 3"  (1.6 m)   This visit occurred during the SARS-CoV-2 public health emergency.  Safety protocols were in place, including screening questions prior to the visit, additional usage of staff PPE, and extensive cleaning of exam room while observing appropriate contact time as indicated for disinfecting solutions.   Assessment & Plan:

## 2019-10-21 NOTE — Patient Instructions (Signed)
Health Maintenance, Female Adopting a healthy lifestyle and getting preventive care are important in promoting health and wellness. Ask your health care provider about:  The right schedule for you to have regular tests and exams.  Things you can do on your own to prevent diseases and keep yourself healthy. What should I know about diet, weight, and exercise? Eat a healthy diet   Eat a diet that includes plenty of vegetables, fruits, low-fat dairy products, and lean protein.  Do not eat a lot of foods that are high in solid fats, added sugars, or sodium. Maintain a healthy weight Body mass index (BMI) is used to identify weight problems. It estimates body fat based on height and weight. Your health care provider can help determine your BMI and help you achieve or maintain a healthy weight. Get regular exercise Get regular exercise. This is one of the most important things you can do for your health. Most adults should:  Exercise for at least 150 minutes each week. The exercise should increase your heart rate and make you sweat (moderate-intensity exercise).  Do strengthening exercises at least twice a week. This is in addition to the moderate-intensity exercise.  Spend less time sitting. Even light physical activity can be beneficial. Watch cholesterol and blood lipids Have your blood tested for lipids and cholesterol at 82 years of age, then have this test every 5 years. Have your cholesterol levels checked more often if:  Your lipid or cholesterol levels are high.  You are older than 82 years of age.  You are at high risk for heart disease. What should I know about cancer screening? Depending on your health history and family history, you may need to have cancer screening at various ages. This may include screening for:  Breast cancer.  Cervical cancer.  Colorectal cancer.  Skin cancer.  Lung cancer. What should I know about heart disease, diabetes, and high blood  pressure? Blood pressure and heart disease  High blood pressure causes heart disease and increases the risk of stroke. This is more likely to develop in people who have high blood pressure readings, are of African descent, or are overweight.  Have your blood pressure checked: ? Every 3-5 years if you are 18-39 years of age. ? Every year if you are 40 years old or older. Diabetes Have regular diabetes screenings. This checks your fasting blood sugar level. Have the screening done:  Once every three years after age 40 if you are at a normal weight and have a low risk for diabetes.  More often and at a younger age if you are overweight or have a high risk for diabetes. What should I know about preventing infection? Hepatitis B If you have a higher risk for hepatitis B, you should be screened for this virus. Talk with your health care provider to find out if you are at risk for hepatitis B infection. Hepatitis C Testing is recommended for:  Everyone born from 1945 through 1965.  Anyone with known risk factors for hepatitis C. Sexually transmitted infections (STIs)  Get screened for STIs, including gonorrhea and chlamydia, if: ? You are sexually active and are younger than 82 years of age. ? You are older than 82 years of age and your health care provider tells you that you are at risk for this type of infection. ? Your sexual activity has changed since you were last screened, and you are at increased risk for chlamydia or gonorrhea. Ask your health care provider if   you are at risk.  Ask your health care provider about whether you are at high risk for HIV. Your health care provider may recommend a prescription medicine to help prevent HIV infection. If you choose to take medicine to prevent HIV, you should first get tested for HIV. You should then be tested every 3 months for as long as you are taking the medicine. Pregnancy  If you are about to stop having your period (premenopausal) and  you may become pregnant, seek counseling before you get pregnant.  Take 400 to 800 micrograms (mcg) of folic acid every day if you become pregnant.  Ask for birth control (contraception) if you want to prevent pregnancy. Osteoporosis and menopause Osteoporosis is a disease in which the bones lose minerals and strength with aging. This can result in bone fractures. If you are 65 years old or older, or if you are at risk for osteoporosis and fractures, ask your health care provider if you should:  Be screened for bone loss.  Take a calcium or vitamin D supplement to lower your risk of fractures.  Be given hormone replacement therapy (HRT) to treat symptoms of menopause. Follow these instructions at home: Lifestyle  Do not use any products that contain nicotine or tobacco, such as cigarettes, e-cigarettes, and chewing tobacco. If you need help quitting, ask your health care provider.  Do not use street drugs.  Do not share needles.  Ask your health care provider for help if you need support or information about quitting drugs. Alcohol use  Do not drink alcohol if: ? Your health care provider tells you not to drink. ? You are pregnant, may be pregnant, or are planning to become pregnant.  If you drink alcohol: ? Limit how much you use to 0-1 drink a day. ? Limit intake if you are breastfeeding.  Be aware of how much alcohol is in your drink. In the [place] one drink equals one 12 oz bottle of beer (355 mL), one 5 oz glass of wine (148 mL), or one 1 oz glass of hard liquor (44 mL). General instructions  Schedule regular health, dental, and eye exams.  Stay current with your vaccines.  Tell your health care provider if: ? You often feel depressed. ? You have ever been abused or do not feel safe at home. Summary  Adopting a healthy lifestyle and getting preventive care are important in promoting health and wellness.  Follow your health care provider's instructions about healthy  diet, exercising, and getting tested or screened for diseases.  Follow your health care provider's instructions on monitoring your cholesterol and blood pressure. This information is not intended to replace advice given to you by your health care provider. Make sure you discuss any questions you have with your health care provider. Document Revised: 04/10/2018 Document Reviewed: 04/10/2018 Elsevier Patient Education  2020 [employer]  

## 2019-11-13 DIAGNOSIS — J3089 Other allergic rhinitis: Secondary | ICD-10-CM | POA: Diagnosis not present

## 2019-11-13 DIAGNOSIS — J301 Allergic rhinitis due to pollen: Secondary | ICD-10-CM | POA: Diagnosis not present

## 2019-11-20 DIAGNOSIS — J3081 Allergic rhinitis due to animal (cat) (dog) hair and dander: Secondary | ICD-10-CM | POA: Diagnosis not present

## 2019-11-20 DIAGNOSIS — J301 Allergic rhinitis due to pollen: Secondary | ICD-10-CM | POA: Diagnosis not present

## 2019-11-20 DIAGNOSIS — J3089 Other allergic rhinitis: Secondary | ICD-10-CM | POA: Diagnosis not present

## 2019-11-26 ENCOUNTER — Other Ambulatory Visit: Payer: Self-pay

## 2019-11-26 ENCOUNTER — Ambulatory Visit (INDEPENDENT_AMBULATORY_CARE_PROVIDER_SITE_OTHER): Payer: Medicare Other | Admitting: Obstetrics & Gynecology

## 2019-11-26 ENCOUNTER — Encounter: Payer: Self-pay | Admitting: Obstetrics & Gynecology

## 2019-11-26 VITALS — BP 122/78 | Ht 62.5 in | Wt 110.0 lb

## 2019-11-26 DIAGNOSIS — M8589 Other specified disorders of bone density and structure, multiple sites: Secondary | ICD-10-CM

## 2019-11-26 DIAGNOSIS — Z01419 Encounter for gynecological examination (general) (routine) without abnormal findings: Secondary | ICD-10-CM | POA: Diagnosis not present

## 2019-11-26 DIAGNOSIS — Z780 Asymptomatic menopausal state: Secondary | ICD-10-CM

## 2019-11-26 NOTE — Progress Notes (Signed)
[name] [name] [birth year] [id number]   History:    84 y.o. G1P1L1 Widowed  RP:  Established patient presenting for annual gyn exam   HPI: Postmenopausal, well on no hormone replacement therapy.  No postmenopausal bleeding.  No pelvic pain.  Abstinent.  Urine and bowel movements normal.  Breasts normal.  Body mass index 19.8. Walking regularly.  Health labs with family physician.  Past medical history,surgical history, family history and social history were all reviewed and documented in the EPIC chart.  Gynecologic History No LMP recorded. Patient is postmenopausal.  Obstetric History OB History  Gravida Para Term Preterm AB Living  1 1 1    0 1  SAB TAB Ectopic Multiple Live Births               # Outcome Date GA Lbr Len/2nd Weight Sex Delivery Anes PTL Lv  1 Term              ROS: A ROS was performed and pertinent positives and negatives are included in the history.  GENERAL: No fevers or chills. HEENT: No change in vision, no earache, sore throat or sinus congestion. NECK: No pain or stiffness. CARDIOVASCULAR: No chest pain or pressure. No palpitations. PULMONARY: No shortness of breath, cough or wheeze. GASTROINTESTINAL: No abdominal pain, nausea, vomiting or diarrhea, melena or bright red blood per rectum. GENITOURINARY: No urinary frequency, urgency, hesitancy or dysuria. MUSCULOSKELETAL: No joint or muscle pain, no back pain, no recent trauma. DERMATOLOGIC: No rash, no itching, no lesions. ENDOCRINE: No polyuria, polydipsia, no heat or cold intolerance. No recent change in weight. HEMATOLOGICAL: No anemia or easy bruising or bleeding. NEUROLOGIC: No headache, seizures, numbness, tingling or weakness. PSYCHIATRIC: No depression, no loss of interest in normal activity or change in sleep pattern.     Exam:   BP 122/78   Ht 5' 2.5" (1.588 m)   Wt 110 lb (49.9 kg)   BMI 19.80 kg/m   Body mass index is 19.8 kg/m.  General appearance : Well developed well nourished  female. No acute distress HEENT: Eyes: no retinal hemorrhage or exudates,  Neck supple, trachea midline, no carotid bruits, no thyroidmegaly Lungs: Clear to auscultation, no rhonchi or wheezes, or rib retractions  Heart: Regular rate and rhythm, no murmurs or gallops Breast:Examined in sitting and supine position were symmetrical in appearance, no palpable masses or tenderness,  no skin retraction, no nipple inversion, no nipple discharge, no skin discoloration, no axillary or supraclavicular lymphadenopathy Abdomen: no palpable masses or tenderness, no rebound or guarding Extremities: no edema or skin discoloration or tenderness  Pelvic: Vulva: Normal             Vagina: No gross lesions or discharge  Cervix: No gross lesions or discharge  Uterus  AV, normal size, shape and consistency, non-tender and mobile  Adnexa  Without masses or tenderness  Anus: Normal   Assessment/Plan:  84 y.o. female for annual exam   1. Well female exam with routine gynecological exam Normal gynecologic exam in menopause.  No indication to repeat a Pap test.  Breast exam normal.  Last mammogram February 2021.  No longer doing colonoscopy.  Health labs with family physician.  Good body mass index at 19.8.  Continue with healthy nutrition and fitness, patient enjoys walking.  2. Postmenopause Well on no hormone replacement therapy.  No postmenopausal bleeding.  3. Osteopenia of multiple sites Osteopenia on bone density October 2020.  We will repeat a bone density October 2022.  Princess Bruins MD, 10:23 AM 11/26/2019

## 2019-11-27 DIAGNOSIS — J301 Allergic rhinitis due to pollen: Secondary | ICD-10-CM | POA: Diagnosis not present

## 2019-11-27 DIAGNOSIS — J3089 Other allergic rhinitis: Secondary | ICD-10-CM | POA: Diagnosis not present

## 2019-11-29 ENCOUNTER — Encounter: Payer: Self-pay | Admitting: Obstetrics & Gynecology

## 2019-12-04 DIAGNOSIS — J301 Allergic rhinitis due to pollen: Secondary | ICD-10-CM | POA: Diagnosis not present

## 2019-12-04 DIAGNOSIS — J3089 Other allergic rhinitis: Secondary | ICD-10-CM | POA: Diagnosis not present

## 2019-12-11 DIAGNOSIS — J301 Allergic rhinitis due to pollen: Secondary | ICD-10-CM | POA: Diagnosis not present

## 2019-12-11 DIAGNOSIS — J3089 Other allergic rhinitis: Secondary | ICD-10-CM | POA: Diagnosis not present

## 2019-12-18 DIAGNOSIS — J3089 Other allergic rhinitis: Secondary | ICD-10-CM | POA: Diagnosis not present

## 2019-12-18 DIAGNOSIS — J301 Allergic rhinitis due to pollen: Secondary | ICD-10-CM | POA: Diagnosis not present

## 2019-12-25 DIAGNOSIS — J301 Allergic rhinitis due to pollen: Secondary | ICD-10-CM | POA: Diagnosis not present

## 2019-12-25 DIAGNOSIS — J3089 Other allergic rhinitis: Secondary | ICD-10-CM | POA: Diagnosis not present

## 2020-01-01 DIAGNOSIS — J301 Allergic rhinitis due to pollen: Secondary | ICD-10-CM | POA: Diagnosis not present

## 2020-01-01 DIAGNOSIS — J3089 Other allergic rhinitis: Secondary | ICD-10-CM | POA: Diagnosis not present

## 2020-01-08 DIAGNOSIS — J3089 Other allergic rhinitis: Secondary | ICD-10-CM | POA: Diagnosis not present

## 2020-01-08 DIAGNOSIS — J301 Allergic rhinitis due to pollen: Secondary | ICD-10-CM | POA: Diagnosis not present

## 2020-01-15 DIAGNOSIS — J301 Allergic rhinitis due to pollen: Secondary | ICD-10-CM | POA: Diagnosis not present

## 2020-01-15 DIAGNOSIS — J3089 Other allergic rhinitis: Secondary | ICD-10-CM | POA: Diagnosis not present

## 2020-01-17 ENCOUNTER — Other Ambulatory Visit: Payer: Self-pay | Admitting: Cardiology

## 2020-01-21 ENCOUNTER — Other Ambulatory Visit: Payer: Self-pay | Admitting: Cardiology

## 2020-01-21 DIAGNOSIS — J301 Allergic rhinitis due to pollen: Secondary | ICD-10-CM | POA: Diagnosis not present

## 2020-01-21 DIAGNOSIS — J3089 Other allergic rhinitis: Secondary | ICD-10-CM | POA: Diagnosis not present

## 2020-01-22 DIAGNOSIS — J3089 Other allergic rhinitis: Secondary | ICD-10-CM | POA: Diagnosis not present

## 2020-01-22 DIAGNOSIS — J301 Allergic rhinitis due to pollen: Secondary | ICD-10-CM | POA: Diagnosis not present

## 2020-01-27 DIAGNOSIS — Z7189 Other specified counseling: Secondary | ICD-10-CM | POA: Insufficient documentation

## 2020-01-27 DIAGNOSIS — R9431 Abnormal electrocardiogram [ECG] [EKG]: Secondary | ICD-10-CM | POA: Insufficient documentation

## 2020-01-27 NOTE — Progress Notes (Signed)
Cardiology Office Note   Date:  01/29/2020   ID:  MALEEYAH MCCAUGHEY, DOB 08/28/1935, MRN [id number]  PCP:  Hoyt Koch, MD  Cardiologist:   No primary care provider on file.   Chief Complaint  Patient presents with  . Palpitations      History of Present Illness: [name] [name] is a 84 y.o. female who presents for followup of hypertension and palpitations.   She had dyspnea in 2016.  However, POET (Plain Old Exercise Treadmill) was negative for evidence of ischemia.  She returns for follow up.   Since I last saw her she has had no new cardiac complaints. 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 We switched her to Cardizem from amlodipine previously as her blood pressure was not well controlled and she had palpitations. She thinks her blood pressure is better controlled although it is a little high today. She has fewer palpitations. She has occasionally been having some mild ankle edema but she associates with increased salt intake.  She has been stressed this week because her brother had bypass. She is worried about this family history. He is about 6 years younger than her. He is in the hospital with this now.   Past Medical History:  Diagnosis Date  . Anxiety   . Coronary artery spasm (HCC)    Non obstructive CAD  . Diverticulosis of colon   . Fibromyalgia   . Hx of colonic polyps   . Lumbar back pain   . MVP (mitral valve prolapse)    No antibiotics required for procedures  . Osteoporosis   . Palpitations   . Pneumonia   . Shingles     Past Surgical History:  Procedure Laterality Date  . CATARACT EXTRACTION, BILATERAL    . CATARACT EXTRACTION, BILATERAL  05/2018  . CHOLECYSTECTOMY  03/2003  . LUMBAR LAMINECTOMY    . TONSILLECTOMY     as a child     Current Outpatient Medications  Medication Sig Dispense Refill  . ALPRAZolam (XANAX) 0.5 MG tablet Take 1-2 tablets (0.5-1 mg total) by mouth 3 (three) times daily as needed for anxiety.  This is 30 day supply 70 tablet 5  . aspirin 81 MG tablet Take 81 mg by mouth daily.      Marland Kitchen BIOTIN 5000 PO Take by mouth 2 (two) times daily.     . calcium carbonate (CALCIUM 600) 600 MG TABS Take 600 mg by mouth daily.     . cetirizine (ZYRTEC) 10 MG tablet Take 10 mg by mouth daily.      . Cholecalciferol (VITAMIN D) 2000 units tablet Take 2,000 Units by mouth daily.    Marland Kitchen diltiazem (CARDIZEM CD) 120 MG 24 hr capsule TAKE (1) CAPSULE DAILY. 90 capsule 0  . EPINEPHrine (EPI-PEN) 0.3 mg/0.3 mL DEVI Inject 0.3 mg into the muscle as needed.    . fish oil-omega-3 fatty acids 1000 MG capsule Take 1 g by mouth daily.      . fluticasone (FLONASE) 50 MCG/ACT nasal spray Place 2 sprays into both nostrils daily as needed for allergies or rhinitis.    . folic acid (FOLVITE) 017 MCG tablet Take 800 mcg by mouth daily.    . Multiple Vitamins-Minerals (QC [facility] SUPPORT 2 PO) Take 1 tablet by mouth daily.    . nitroGLYCERIN (NITROSTAT) 0.4 MG SL tablet Place 1 tablet (0.4 mg total) under the tongue every 5 (five) minutes as needed. May repeat 3 times 25  tablet 2  . Pyridoxine HCl (VITAMIN B6 PO) Take by mouth.    . traZODone (DESYREL) 100 MG tablet Take 1 tablet (100 mg total) by mouth at bedtime. 90 tablet 3  . TURMERIC PO Take 1,500 mg by mouth daily.    Marland Kitchen UNABLE TO FIND Allergy shots weekly     No current facility-administered medications for this visit.    Allergies:   Codeine, Erythromycin, and Other    ROS:  Please see the history of present illness.   Otherwise, review of systems are positive for none.   All other systems are reviewed and negative.    PHYSICAL EXAM: VS:  BP (!) 146/72   Pulse 80   Ht 5' 0.5" (1.537 m)   Wt 114 lb 12.8 oz (52.1 kg)   BMI 22.05 kg/m  , BMI Body mass index is 22.05 kg/m. GENERAL:  Well appearing NECK:  No jugular venous distention, waveform within normal limits, carotid upstroke brisk and symmetric, no bruits, no thyromegaly LUNGS:  Clear to  auscultation bilaterally CHEST:  Unremarkable HEART:  PMI not displaced or sustained,S1 and S2 within normal limits, no S3, no S4, no clicks, no rubs, no murmurs ABD:  Flat, positive bowel sounds normal in frequency in pitch, no bruits, no rebound, no guarding, no midline pulsatile mass, no hepatomegaly, no splenomegaly EXT:  2 plus pulses throughout, no edema, no cyanosis no clubbing  EKG:  EKG is ordered today. The ekg ordered today demonstrates sinus rhythm, rate 88, axis left axis deviation, left ventricular hypertrophy by voltage criteria, poor anterior R wave progression, nonspecific T wave changes, no change from previous.   Recent Labs: 10/21/2019: ALT 24; BUN 10; Creatinine, Ser 0.71; Hemoglobin 14.9; Platelets 265.0; Potassium 4.2; Sodium 133    Lipid Panel    Component Value Date/Time   CHOL 190 10/21/2019 1017   TRIG 76.0 10/21/2019 1017   HDL 87.20 10/21/2019 1017   CHOLHDL 2 10/21/2019 1017   VLDL 15.2 10/21/2019 1017   LDLCALC 88 10/21/2019 1017   LDLDIRECT 79.4 06/29/2010 0803      Wt Readings from Last 3 Encounters:  01/29/20 114 lb 12.8 oz (52.1 kg)  11/26/19 110 lb (49.9 kg)  10/21/19 108 lb (49 kg)      Other studies Reviewed: Additional studies/ records that were reviewed today include: Labs. Review of the above records demonstrates:  Please see elsewhere in the note.     ASSESSMENT AND PLAN:  PALPITATIONS:     These are improved. No change in therapy.  HTN: Her blood pressure is mildly elevated but she says this is unusual in reading the records I do see that this is an unusually elevated reading. No change in therapy.  ABNORMAL EKG:     She would like to do some screening testing possibly a POET (Plain Old Exercise Treadmill) would be indicated. However, she would like to wait until there is less of an issue with Covid.  COVID EDUCATION: She has been vaccinated.  Current medicines are reviewed at length with the patient today.  The patient does  not have concerns regarding medicines.  The following changes have been made:  no change  Labs/ tests ordered today include: none No orders of the defined types were placed in this encounter.    Disposition:   FU with me in six months.      Signed, Minus Breeding, MD  01/29/2020 2:45 PM    [facility]

## 2020-01-29 ENCOUNTER — Ambulatory Visit (INDEPENDENT_AMBULATORY_CARE_PROVIDER_SITE_OTHER): Payer: Medicare Other | Admitting: Cardiology

## 2020-01-29 ENCOUNTER — Encounter: Payer: Self-pay | Admitting: Cardiology

## 2020-01-29 ENCOUNTER — Other Ambulatory Visit: Payer: Self-pay

## 2020-01-29 VITALS — BP 146/72 | HR 80 | Ht 60.5 in | Wt 114.8 lb

## 2020-01-29 DIAGNOSIS — J3089 Other allergic rhinitis: Secondary | ICD-10-CM | POA: Diagnosis not present

## 2020-01-29 DIAGNOSIS — R002 Palpitations: Secondary | ICD-10-CM | POA: Diagnosis not present

## 2020-01-29 DIAGNOSIS — I10 Essential (primary) hypertension: Secondary | ICD-10-CM

## 2020-01-29 DIAGNOSIS — R9431 Abnormal electrocardiogram [ECG] [EKG]: Secondary | ICD-10-CM

## 2020-01-29 DIAGNOSIS — Z7189 Other specified counseling: Secondary | ICD-10-CM | POA: Diagnosis not present

## 2020-01-29 DIAGNOSIS — J301 Allergic rhinitis due to pollen: Secondary | ICD-10-CM | POA: Diagnosis not present

## 2020-01-29 NOTE — Patient Instructions (Signed)
Medication Instructions:  Your physician recommends that you continue on your current medications as directed. Please refer to the Current Medication list given to you today.  *If you need a refill on your cardiac medications before your next appointment, please call your pharmacy*   Follow-Up: At [facility], you and your health needs are our priority.  As part of our continuing mission to provide you with exceptional heart care, we have created designated Provider Care Teams.  These Care Teams include your primary Cardiologist (physician) and Advanced Practice Providers (APPs -  Physician Assistants and Nurse Practitioners) who all work together to provide you with the care you need, when you need it.  We recommend signing up for the patient portal called "MyChart".  Sign up information is provided on this After Visit Summary.  MyChart is used to connect with patients for Virtual Visits (Telemedicine).  Patients are able to view lab/test results, encounter notes, upcoming appointments, etc.  Non-urgent messages can be sent to your provider as well.   To learn more about what you can do with MyChart, go to [email or web address].    Your next appointment:   6 month(s)  The format for your next appointment:   In Person  Provider:   James Hochrein, MD      

## 2020-01-30 DIAGNOSIS — Z23 Encounter for immunization: Secondary | ICD-10-CM | POA: Diagnosis not present

## 2020-02-02 NOTE — Addendum Note (Signed)
Addended by: Hinton Dyer on: 02/02/2020 03:33 PM   Modules accepted: Orders

## 2020-02-05 DIAGNOSIS — J3089 Other allergic rhinitis: Secondary | ICD-10-CM | POA: Diagnosis not present

## 2020-02-05 DIAGNOSIS — J301 Allergic rhinitis due to pollen: Secondary | ICD-10-CM | POA: Diagnosis not present

## 2020-02-10 ENCOUNTER — Other Ambulatory Visit: Payer: Self-pay

## 2020-02-10 ENCOUNTER — Ambulatory Visit: Payer: Medicare Other | Admitting: Pharmacist

## 2020-02-10 DIAGNOSIS — M8589 Other specified disorders of bone density and structure, multiple sites: Secondary | ICD-10-CM

## 2020-02-10 DIAGNOSIS — I10 Essential (primary) hypertension: Secondary | ICD-10-CM

## 2020-02-10 DIAGNOSIS — F411 Generalized anxiety disorder: Secondary | ICD-10-CM

## 2020-02-10 NOTE — Patient Instructions (Addendum)
Visit Information  Phone number for Pharmacist: [phone]  Goals Addressed            This Visit's Progress   . Pharmacy Care Plan       CARE PLAN ENTRY (see longitudinal plan of care for additional care plan information)  Current Barriers:  . Chronic Disease Management support, education, and care coordination needs related to Hypertension, Anxiety, and Osteopenia   Hypertension BP Readings from Last 3 Encounters:  01/29/20 (!) 146/72  11/26/19 122/78  10/21/19 126/84 .  Pharmacist Clinical Goal(s): o Over the next 180 days, patient will work with PharmD and providers to maintain BP goal <130/80 . Current regimen:  o Diltiazem 120 mg daily . Interventions: o Discussed BP goals and benefits of medications for prevention of heart attack / stroke . Patient self care activities - Over the next 180 days, patient will: o Check BP weekly, document, and provide at future appointments o Ensure daily salt intake < 2300 mg/day  Osteopenia . Pharmacist Clinical Goal(s) o Over the next 180 days, patient will work with PharmD and providers to optimize therapy . Current regimen:  o calcium 600 mg daily o Vitamin D 2000 IU daily . Interventions: o Discussed optimal calcium (1200 mg/day total) and vitamin D supplementation to prevent bone breakdown . Patient self care activities - Over the next 180 days, patient will: o Continue current supplements and diet  Anxiety / Insomnia . Pharmacist Clinical Goal(s) o Over the next 180 days, patient will work with PharmD and providers to optimize therapy . Current regimen:  o alprazolam 0.5 mg - 1/2 tab AM and PM o trazodone 100 mg at bedtime as needed . Interventions: o Discussed long term risks associated with benzodiazepine use . Patient self care activities - Over the next 180 days, patient will: o Continue medications as directed; use lowest amount of alprazolam as possible  Medication management . Pharmacist Clinical  Goal(s): o Over the next 180 days, patient will work with PharmD and providers to maintain optimal medication adherence . Current pharmacy: [facility] . Interventions o Comprehensive medication review performed. o Continue current medication management strategy . Patient self care activities - Over the next 180 days, patient will: o Focus on medication adherence by fill date o Take medications as prescribed o Report any questions or concerns to PharmD and/or provider(s)  Please see past updates related to this goal by clicking on the "Past Updates" button in the selected goal       Patient verbalizes understanding of instructions provided today.  Telephone follow up appointment with pharmacy team member scheduled for: 6 months  Charlene Brooke, PharmD, BCACP Clinical Pharmacist [facility] [place] protect organs, store calcium, anchor muscles, and support the whole body. Keeping your bones strong is important, especially as you get older. You can take actions to help keep your bones strong and healthy. Why is keeping my bones healthy important?  Keeping your bones healthy is important because your body constantly replaces bone cells. Cells get old, and new cells take their place. As we age, we lose bone cells because the body may not be able to make enough new cells to replace the old cells. The amount of bone cells and bone tissue you have is referred to as bone mass. The higher your bone mass, the stronger your bones. The aging process leads to an overall loss of bone mass in the body, which can increase the likelihood  of:  Joint pain and stiffness.  Broken bones.  A condition in which the bones become weak and brittle (osteoporosis). A large decline in bone mass occurs in older adults. In women, it occurs about the time of menopause. What actions can I take to keep my bones healthy? Good health habits are important for  maintaining healthy bones. This includes eating nutritious foods and exercising regularly. To have healthy bones, you need to get enough of the right minerals and vitamins. Most nutrition experts recommend getting these nutrients from the foods that you eat. In some cases, taking supplements may also be recommended. Doing certain types of exercise is also important for bone health. What are the nutritional recommendations for healthy bones?  Eating a well-balanced diet with plenty of calcium and vitamin D will help to protect your bones. Nutritional recommendations vary from person to person. Ask your health care provider what is healthy for you. Here are some general guidelines. Get enough calcium Calcium is the most important (essential) mineral for bone health. Most people can get enough calcium from their diet, but supplements may be recommended for people who are at risk for osteoporosis. Good sources of calcium include:  Dairy products, such as low-fat or nonfat milk, cheese, and yogurt.  Dark green leafy vegetables, such as bok choy and broccoli.  Calcium-fortified foods, such as orange juice, cereal, bread, soy beverages, and tofu products.  Nuts, such as almonds. Follow these recommended amounts for daily calcium intake:  Children, age 77-3: 700 mg.  Children, age 58-8: 1,000 mg.  Children, age 73-13: 1,300 mg.  Teens, age 61-18: 1,300 mg.  Adults, age 22-50: 1,000 mg.  Adults, age 582-70: ? Men: 1,000 mg. ? Women: 1,200 mg.  Adults, age 70 or older: 1,200 mg.  Pregnant and breastfeeding females: ? Teens: 1,300 mg. ? Adults: 1,000 mg. Get enough vitamin D Vitamin D is the most essential vitamin for bone health. It helps the body absorb calcium. Sunlight stimulates the skin to make vitamin D, so be sure to get enough sunlight. If you live in a cold climate or you do not get outside often, your health care provider may recommend that you take vitamin D supplements. Good sources  of vitamin D in your diet include:  Egg yolks.  Saltwater fish.  Milk and cereal fortified with vitamin D. Follow these recommended amounts for daily vitamin D intake:  Children and teens, age 77-18: 600 international units.  Adults, age 51 or younger: 400-800 international units.  Adults, age 9 or older: 800-1,000 international units. Get other important nutrients Other nutrients that are important for bone health include:  Phosphorus. This mineral is found in meat, poultry, dairy foods, nuts, and legumes. The recommended daily intake for adult men and adult women is 700 mg.  Magnesium. This mineral is found in seeds, nuts, dark green vegetables, and legumes. The recommended daily intake for adult men is 400-420 mg. For adult women, it is 310-320 mg.  Vitamin K. This vitamin is found in green leafy vegetables. The recommended daily intake is 120 mg for adult men and 90 mg for adult women. What type of physical activity is best for building and maintaining healthy bones? Weight-bearing and strength-building activities are important for building and maintaining healthy bones. Weight-bearing activities cause muscles and bones to work against gravity. Strength-building activities increase the strength of the muscles that support bones. Weight-bearing and muscle-building activities include:  Walking and hiking.  Jogging and running.  Dancing.  Gym exercises.  Lifting weights.  Tennis and racquetball.  Climbing stairs.  Aerobics. Adults should get at least 30 minutes of moderate physical activity on most days. Children should get at least 60 minutes of moderate physical activity on most days. Ask your health care provider what type of exercise is best for you. How can I find out if my bone mass is low? Bone mass can be measured with an X-ray test called a bone mineral density (BMD) test. This test is recommended for all women who are age 42 or older. It may also be recommended  for:  Men who are age 53 or older.  People who are at risk for osteoporosis because of: ? Having bones that break easily. ? Having a long-term disease that weakens bones, such as kidney disease or rheumatoid arthritis. ? Having menopause earlier than normal. ? Taking medicine that weakens bones, such as steroids, thyroid hormones, or hormone treatment for breast cancer or prostate cancer. ? Smoking. ? Drinking three or more alcoholic drinks a day. If you find that you have a low bone mass, you may be able to prevent osteoporosis or further bone loss by changing your diet and lifestyle. Where can I find more information? For more information, check out the following websites:  [employer]: AviationTales.fr  [employer] of Health: www.bones.SouthExposed.es  International Osteoporosis Foundation: Administrator.iofbonehealth.org Summary  The aging process leads to an overall loss of bone mass in the body, which can increase the likelihood of broken bones and osteoporosis.  Eating a well-balanced diet with plenty of calcium and vitamin D will help to protect your bones.  Weight-bearing and strength-building activities are also important for building and maintaining strong bones.  Bone mass can be measured with an X-ray test called a bone mineral density (BMD) test. This information is not intended to replace advice given to you by your health care provider. Make sure you discuss any questions you have with your health care provider. Document Revised: 05/14/2017 Document Reviewed: 05/14/2017 Elsevier Patient Education  2020 [employer].

## 2020-02-10 NOTE — Chronic Care Management (AMB) (Signed)
Chronic Care Management Pharmacy  Name: [name] [name]  MRN: [id number] DOB: 20-Sep-1935   Chief Complaint/ HPI  [name] [name],  84 y.o. , female presents for their Follow-Up CCM visit with the clinical pharmacist via telephone due to COVID-19 Pandemic.  PCP : Hoyt Koch, MD  Their chronic conditions include: HTN, asthma, allergies, osteopenia, anxiety, coronary atherosclerosis  Born in [place], attended "[employer]", moved to [place] when she got married - married for 12 years, husband died 4 years ago pancreatic cancer. Lives alone now. 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 Just started back last week after being away during pandemic. Lives in [place], part of book club. 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 Likes to walk.   Office Visits: 10/21/19 Dr Sharlet Salina OV: CPE, no med changes.  10/14/18 Dr Sharlet Salina OV: stable on amlodipine and trazodone, taking more alprazolam than usual lately, reminded that #70 is 30 day supply.  Consult Visit: 01/29/20 Dr Percival Spanish (cardiology): f/u palpitations, improved. No med changes.   10/14/19 Dr Dellis Filbert (OB/GYN): UTI - rx'd Bactrim. Urine culture negative for infection.  Dr Harold Hedge (allergy/imm): via claims  01/28/19 Dr Percival Spanish (cardiology): c/o palpitations, switched amlodipine to diltiazem.   Allergies  Allergen Reactions  . Codeine     REACTION: NAUSEA  . Erythromycin     REACTION: NAUSEA  . Other     Environmental allergies   Medications: Outpatient Encounter Medications as of 02/10/2020  Medication Sig  . ALPRAZolam (XANAX) 0.5 MG tablet Take 1-2 tablets (0.5-1 mg total) by mouth 3 (three) times daily as needed for anxiety. This is 30 day supply  . aspirin 81 MG tablet Take 81 mg by mouth daily.    Marland Kitchen BIOTIN 5000 PO Take by mouth 2 (two) times daily.   . calcium carbonate (CALCIUM 600) 600 MG TABS Take 600 mg by mouth daily.   .  cetirizine (ZYRTEC) 10 MG tablet Take 10 mg by mouth daily.    . Cholecalciferol (VITAMIN D) 2000 units tablet Take 2,000 Units by mouth daily.  Marland Kitchen diltiazem (CARDIZEM CD) 120 MG 24 hr capsule TAKE (1) CAPSULE DAILY.  Marland Kitchen EPINEPHrine (EPI-PEN) 0.3 mg/0.3 mL DEVI Inject 0.3 mg into the muscle as needed.  . fish oil-omega-3 fatty acids 1000 MG capsule Take 1 g by mouth daily.    . fluticasone (FLONASE) 50 MCG/ACT nasal spray Place 2 sprays into both nostrils daily as needed for allergies or rhinitis.  . folic acid (FOLVITE) 284 MCG tablet Take 800 mcg by mouth daily.  . Multiple Vitamins-Minerals (QC OCUHEALTH VISION SUPPORT 2 PO) Take 1 tablet by mouth daily.  . nitroGLYCERIN (NITROSTAT) 0.4 MG SL tablet Place 1 tablet (0.4 mg total) under the tongue every 5 (five) minutes as needed. May repeat 3 times  . Pyridoxine HCl (VITAMIN B6 PO) Take by mouth.  . traZODone (DESYREL) 100 MG tablet Take 1 tablet (100 mg total) by mouth at bedtime.  . TURMERIC PO Take 1,500 mg by mouth daily.  Marland Kitchen UNABLE TO FIND Allergy shots weekly   No facility-administered encounter medications on file as of 02/10/2020.   Wt Readings from Last 3 Encounters:  01/29/20 114 lb 12.8 oz (52.1 kg)  11/26/19 110 lb (49.9 kg)  10/21/19 108 lb (49 kg)   DEXA scan results 02/21/19 (Dr Renne Crigler) Results: Lumbar spine L1-L4 Femoral neck (FN) 33% distal radius  T-score -1.7 RFN: -1.3 LFN: -1.6 n/a  Change in BMD from previous DXA test (%) +2.7% -2.0% n/a  (*) statistically significant  Assessment: the BMD is low according to the [facility] classification for osteoporosis (see below). Fracture risk: moderate FRAX score: 10 year major osteoporotic risk: 11.6%. 10 year hip fracture risk: 3.3%. The thresholds for treatment are 20% and 3%, respectively. Comments: the technical quality of the study is good. Evaluation for secondary causes should be considered if clinically indicated.  Recommend optimizing calcium (1200 mg/day) and vitamin D  (800 IU/day) intake.  Followup: Repeat BMD is appropriate after 2 years or after 1-2 years if starting treatment.   Current Diagnosis/Assessment:  Goals Addressed            This Visit's Progress   . Pharmacy Care Plan       CARE PLAN ENTRY (see longitudinal plan of care for additional care plan information)  Current Barriers:  . Chronic Disease Management support, education, and care coordination needs related to Hypertension, Anxiety, and Osteopenia   Hypertension BP Readings from Last 3 Encounters:  01/29/20 (!) 146/72  11/26/19 122/78  10/21/19 126/84 .  Pharmacist Clinical Goal(s): o Over the next 180 days, patient will work with PharmD and providers to maintain BP goal <130/80 . Current regimen:  o Diltiazem 120 mg daily . Interventions: o Discussed BP goals and benefits of medications for prevention of heart attack / stroke . Patient self care activities - Over the next 180 days, patient will: o Check BP weekly, document, and provide at future appointments o Ensure daily salt intake < 2300 mg/day  Osteopenia . Pharmacist Clinical Goal(s) o Over the next 180 days, patient will work with PharmD and providers to optimize therapy . Current regimen:  o calcium 600 mg daily o Vitamin D 2000 IU daily . Interventions: o Discussed optimal calcium (1200 mg/day total) and vitamin D supplementation to prevent bone breakdown . Patient self care activities - Over the next 180 days, patient will: o Continue current supplements and diet  Anxiety / Insomnia . Pharmacist Clinical Goal(s) o Over the next 180 days, patient will work with PharmD and providers to optimize therapy . Current regimen:  o alprazolam 0.5 mg - 1/2 tab AM and PM o trazodone 100 mg at bedtime as needed . Interventions: o Discussed long term risks associated with benzodiazepine use . Patient self care activities - Over the next 180 days, patient will: o Continue medications as directed; use lowest  amount of alprazolam as possible  Medication management . Pharmacist Clinical Goal(s): o Over the next 180 days, patient will work with PharmD and providers to maintain optimal medication adherence . Current pharmacy: [facility] . Interventions o Comprehensive medication review performed. o Continue current medication management strategy . Patient self care activities - Over the next 180 days, patient will: o Focus on medication adherence by fill date o Take medications as prescribed o Report any questions or concerns to PharmD and/or provider(s)  Please see past updates related to this goal by clicking on the "Past Updates" button in the selected goal        Hypertension   BP goal < 130/80  Office blood pressues are: BP Readings from Last 3 Encounters:  01/29/20 (!) 146/72  11/26/19 122/78  10/21/19 126/84   Kidney Function Lab Results  Component Value Date/Time   CREATININE 0.71 10/21/2019 10:17 AM   CREATININE 0.68 10/14/2018 08:31 AM   GFR 78.73 10/21/2019 10:17 AM   GFRNONAA 106.38 09/28/2009 03:47 PM   GFRAA  106 06/16/2008 09:05 AM   K 4.2 10/21/2019 10:17 AM   K 4.4 10/14/2018 08:31 AM    Patient checks BP at home infrequently  Patient home BP readings are ranging: n/a  Patient has failed these meds in the past: amlodipine Patient is currently controlled on the following medications:   diltiazem 120 mg daily  We discussed diet and exercise extensively  Weight lifting, walking several times per week.  Tries to eat healthy. Compliant with meds.  Plan  Continue current medications and control with diet and exercise   Coronary atherosclerosis   LDL goal < 100  Lipid Panel     Component Value Date/Time   CHOL 190 10/21/2019 1017   TRIG 76.0 10/21/2019 1017   HDL 87.20 10/21/2019 1017   CHOLHDL 2 10/21/2019 1017   VLDL 15.2 10/21/2019 1017   LDLCALC 88 10/21/2019 1017   LDLDIRECT 79.4 06/29/2010 0803    The ASCVD Risk score (Goff DC Jr., et  al., 2013) failed to calculate for the following reasons:   The 2013 ASCVD risk score is only valid for ages 20 to 21   Patient has failed these meds in past: n/a Patient is currently controlled on the following medications:   aspirin 81 mg daily,   nitroglycerin 0.4 mg SL prn,   OTC fish oil  We discussed:  diet and exercise extensively; Cholesterol goals; benefits of diet / exercise for ASCVD risk reduction  Plan  Continue current medications and control with diet and exercise   Anxiety/Insomnia   Patient has failed these meds in past: n/a Patient is currently controlled on the following medications:   alprazolam 0.5 mg - 1/2 tab TID prn,   trazodone 100 mg HS  We discussed:  Pt reports she has been on Xanax since 1986 - takes 1/2 AM and 1/2 PM and this control anxiety well.   Plan  Continue current medications  Osteopenia   Patient has failed these meds in past: n/a Patient is currently controlled on the following medications:   calcium 600 mg,   Vitamin D 2000 IU  We discussed:  Benefits of calcium/Vitamin D for maintaining bone health. Discussed most recent DEXA scan actually showed improvement from previous scan. Encouraged continued exercise.  Plan  Continue current medications  Vaccines   Reviewed and discussed patient's vaccination history.    Immunization History  Administered Date(s) Administered  . Influenza Split 01/30/2011  . Influenza Whole 01/29/2009, 01/29/2010, 01/10/2012, 02/09/2014  . Influenza,inj,Quad PF,6+ Mos 01/24/2013, 01/31/2016  . Influenza-Unspecified 01/26/2015, 01/04/2018  . PFIZER SARS-COV-2 Vaccination 05/23/2019, 06/13/2019  . Pneumococcal Conjugate-13 04/17/2014  . Pneumococcal Polysaccharide-23 05/02/2003  . Td 07/19/2009  . Tdap 05/24/2018  . Zoster 07/31/2012  . Zoster Recombinat (Shingrix) 08/16/2016, 12/15/2016    Plan  Recommended patient receive COVID booster vaccine this month  Medication Management    Pt uses [facility] for all medications Does not use pill box  Pt endorses 100% compliance  We discussed: Patient reports compliance with meds as described, denies missed doses, denies issues with pharmacy.  Plan  Continue current medication management strategy     Follow up: 6 month phone visit  Charlene Brooke, PharmD, [facility] Clinical Pharmacist [facility] [phone]

## 2020-02-12 DIAGNOSIS — J3089 Other allergic rhinitis: Secondary | ICD-10-CM | POA: Diagnosis not present

## 2020-02-12 DIAGNOSIS — J301 Allergic rhinitis due to pollen: Secondary | ICD-10-CM | POA: Diagnosis not present

## 2020-02-12 DIAGNOSIS — J3081 Allergic rhinitis due to animal (cat) (dog) hair and dander: Secondary | ICD-10-CM | POA: Diagnosis not present

## 2020-02-19 DIAGNOSIS — J301 Allergic rhinitis due to pollen: Secondary | ICD-10-CM | POA: Diagnosis not present

## 2020-02-19 DIAGNOSIS — J3089 Other allergic rhinitis: Secondary | ICD-10-CM | POA: Diagnosis not present

## 2020-02-28 ENCOUNTER — Other Ambulatory Visit: Payer: Self-pay

## 2020-02-28 ENCOUNTER — Ambulatory Visit: Payer: Medicare Other | Attending: Internal Medicine

## 2020-02-28 DIAGNOSIS — Z23 Encounter for immunization: Secondary | ICD-10-CM

## 2020-02-28 NOTE — Progress Notes (Signed)
   Covid-19 Vaccination Clinic  Name:  SARAMARIE STINGER    MRN: [id number] DOB: 1935/06/19  02/28/2020  Ms. Forge was observed post Covid-19 immunization for 30 minutes based on pre-vaccination screening without incident. She was provided with Vaccine Information Sheet and instruction to access the V-Safe system.   Ms. Lastinger was instructed to call 911 with any severe reactions post vaccine: Marland Kitchen Difficulty breathing  . Swelling of face and throat  . A fast heartbeat  . A bad rash all over body  . Dizziness and weakness

## 2020-03-04 DIAGNOSIS — J3089 Other allergic rhinitis: Secondary | ICD-10-CM | POA: Diagnosis not present

## 2020-03-04 DIAGNOSIS — J301 Allergic rhinitis due to pollen: Secondary | ICD-10-CM | POA: Diagnosis not present

## 2020-03-11 DIAGNOSIS — J301 Allergic rhinitis due to pollen: Secondary | ICD-10-CM | POA: Diagnosis not present

## 2020-03-11 DIAGNOSIS — J3089 Other allergic rhinitis: Secondary | ICD-10-CM | POA: Diagnosis not present

## 2020-03-11 DIAGNOSIS — J3081 Allergic rhinitis due to animal (cat) (dog) hair and dander: Secondary | ICD-10-CM | POA: Diagnosis not present

## 2020-03-18 DIAGNOSIS — J3089 Other allergic rhinitis: Secondary | ICD-10-CM | POA: Diagnosis not present

## 2020-03-18 DIAGNOSIS — J301 Allergic rhinitis due to pollen: Secondary | ICD-10-CM | POA: Diagnosis not present

## 2020-03-18 DIAGNOSIS — J3081 Allergic rhinitis due to animal (cat) (dog) hair and dander: Secondary | ICD-10-CM | POA: Diagnosis not present

## 2020-03-29 DIAGNOSIS — Z20822 Contact with and (suspected) exposure to covid-19: Secondary | ICD-10-CM | POA: Diagnosis not present

## 2020-04-01 DIAGNOSIS — J301 Allergic rhinitis due to pollen: Secondary | ICD-10-CM | POA: Diagnosis not present

## 2020-04-01 DIAGNOSIS — J3089 Other allergic rhinitis: Secondary | ICD-10-CM | POA: Diagnosis not present

## 2020-04-08 DIAGNOSIS — J301 Allergic rhinitis due to pollen: Secondary | ICD-10-CM | POA: Diagnosis not present

## 2020-04-08 DIAGNOSIS — J3089 Other allergic rhinitis: Secondary | ICD-10-CM | POA: Diagnosis not present

## 2020-04-12 ENCOUNTER — Other Ambulatory Visit: Payer: Self-pay | Admitting: Cardiology

## 2020-04-15 DIAGNOSIS — J301 Allergic rhinitis due to pollen: Secondary | ICD-10-CM | POA: Diagnosis not present

## 2020-04-15 DIAGNOSIS — J3089 Other allergic rhinitis: Secondary | ICD-10-CM | POA: Diagnosis not present

## 2020-04-29 DIAGNOSIS — J301 Allergic rhinitis due to pollen: Secondary | ICD-10-CM | POA: Diagnosis not present

## 2020-04-29 DIAGNOSIS — J3089 Other allergic rhinitis: Secondary | ICD-10-CM | POA: Diagnosis not present

## 2020-05-04 ENCOUNTER — Telehealth: Payer: Self-pay | Admitting: Pharmacist

## 2020-05-04 NOTE — Progress Notes (Signed)
° ° °  Chronic Care Management [name] Assistant   Name: [name] [name]  MRN: [id number] DOB: [birth year]  Reason for Encounter: General Adherence Call   PCP : Myrlene Broker, MD  Allergies:   Allergies  Allergen Reactions   Codeine     REACTION: NAUSEA   Erythromycin     REACTION: NAUSEA   Other     Environmental allergies    Medications: Outpatient Encounter Medications as of 05/04/2020  Medication Sig   ALPRAZolam (XANAX) 0.5 MG tablet Take 1-2 tablets (0.5-1 mg total) by mouth 3 (three) times daily as needed for anxiety. This is 30 day supply   aspirin 81 MG tablet Take 81 mg by mouth daily.     BIOTIN 5000 PO Take by mouth 2 (two) times daily.    calcium carbonate (CALCIUM 600) 600 MG TABS Take 600 mg by mouth daily.    cetirizine (ZYRTEC) 10 MG tablet Take 10 mg by mouth daily.     Cholecalciferol (VITAMIN D) 2000 units tablet Take 2,000 Units by mouth daily.   diltiazem (CARDIZEM CD) 120 MG 24 hr capsule TAKE (1) CAPSULE DAILY.   EPINEPHrine (EPI-PEN) 0.3 mg/0.3 mL DEVI Inject 0.3 mg into the muscle as needed.   fish oil-omega-3 fatty acids 1000 MG capsule Take 1 g by mouth daily.     fluticasone (FLONASE) 50 MCG/ACT nasal spray Place 2 sprays into both nostrils daily as needed for allergies or rhinitis.   folic acid (FOLVITE) 800 MCG tablet Take 800 mcg by mouth daily.   Multiple Vitamins-Minerals (QC OCUHEALTH VISION SUPPORT 2 PO) Take 1 tablet by mouth daily.   nitroGLYCERIN (NITROSTAT) 0.4 MG SL tablet Place 1 tablet (0.4 mg total) under the tongue every 5 (five) minutes as needed. May repeat 3 times   Pyridoxine HCl (VITAMIN B6 PO) Take by mouth.   traZODone (DESYREL) 100 MG tablet Take 1 tablet (100 mg total) by mouth at bedtime.   TURMERIC PO Take 1,500 mg by mouth daily.   UNABLE TO FIND Allergy shots weekly   No facility-administered encounter medications on file as of 05/04/2020.    Current Diagnosis: Patient Active Problem List    Diagnosis Date Noted   Educated about COVID-19 virus infection 01/27/2020   Nonspecific abnormal electrocardiogram (ECG) (EKG) 01/27/2020   Asthmatic bronchitis 07/24/2016   Laryngopharyngeal reflux (LPR) 07/24/2016   Routine general medical examination at a health care facility 08/11/2014   Osteopenia 07/23/2013   Insomnia 01/27/2013   Vaginal atrophy 07/22/2012   MVP (mitral valve prolapse)    Allergic rhinitis 02/28/2011   Essential hypertension 05/21/2007   Coronary atherosclerosis 05/21/2007   Anxiety state 05/20/2007   Fibromyalgia 05/20/2007    Goals Addressed   None     Follow-Up:  Pharmacist Review   A general adherence wellness call was made to [name] [name] to see how she has been doing since her last visit with the clinical pharmacist [name] [name]. The patient stated that she has been doing well with just a little touch of arthritis in her hands but other than that she is doing well. The patient states that she is due for a physical coming up with [name] [name] soon. She stated that she does not have any pressing concerns about her health at this time. Overall the patient states she is fine. I told the patient that I will pass along the information to [name] [name].    [name] [name], Clinical Pharmacist Assistant [name] [name]

## 2020-05-06 DIAGNOSIS — J301 Allergic rhinitis due to pollen: Secondary | ICD-10-CM | POA: Diagnosis not present

## 2020-05-06 DIAGNOSIS — J3089 Other allergic rhinitis: Secondary | ICD-10-CM | POA: Diagnosis not present

## 2020-05-13 DIAGNOSIS — J3089 Other allergic rhinitis: Secondary | ICD-10-CM | POA: Diagnosis not present

## 2020-05-13 DIAGNOSIS — J301 Allergic rhinitis due to pollen: Secondary | ICD-10-CM | POA: Diagnosis not present

## 2020-05-18 ENCOUNTER — Other Ambulatory Visit: Payer: Self-pay | Admitting: Internal Medicine

## 2020-05-18 NOTE — Telephone Encounter (Signed)
LR: 10-21-2019 Qty: 70 with 5 refills  Last office visit: 10-21-2019 Upcoming appointment: 08-09-2020

## 2020-05-20 DIAGNOSIS — J3089 Other allergic rhinitis: Secondary | ICD-10-CM | POA: Diagnosis not present

## 2020-05-20 DIAGNOSIS — J301 Allergic rhinitis due to pollen: Secondary | ICD-10-CM | POA: Diagnosis not present

## 2020-05-27 DIAGNOSIS — J3089 Other allergic rhinitis: Secondary | ICD-10-CM | POA: Diagnosis not present

## 2020-05-27 DIAGNOSIS — J301 Allergic rhinitis due to pollen: Secondary | ICD-10-CM | POA: Diagnosis not present

## 2020-05-27 DIAGNOSIS — J3081 Allergic rhinitis due to animal (cat) (dog) hair and dander: Secondary | ICD-10-CM | POA: Diagnosis not present

## 2020-05-31 NOTE — Telephone Encounter (Signed)
Reviewed chart and insurance data for medication adherence. Patient does not have any gaps in adherence and is not in danger of failing any Medicare adherence measures. No further action required.   5 minutes spent in review, coordination, and documentation.  Charlton Haws, [facility] 05/31/20 9:49 AM

## 2020-06-03 DIAGNOSIS — J301 Allergic rhinitis due to pollen: Secondary | ICD-10-CM | POA: Diagnosis not present

## 2020-06-03 DIAGNOSIS — J3089 Other allergic rhinitis: Secondary | ICD-10-CM | POA: Diagnosis not present

## 2020-06-10 DIAGNOSIS — J301 Allergic rhinitis due to pollen: Secondary | ICD-10-CM | POA: Diagnosis not present

## 2020-06-10 DIAGNOSIS — J3089 Other allergic rhinitis: Secondary | ICD-10-CM | POA: Diagnosis not present

## 2020-06-10 DIAGNOSIS — J3081 Allergic rhinitis due to animal (cat) (dog) hair and dander: Secondary | ICD-10-CM | POA: Diagnosis not present

## 2020-06-15 DIAGNOSIS — H1045 Other chronic allergic conjunctivitis: Secondary | ICD-10-CM | POA: Diagnosis not present

## 2020-06-15 DIAGNOSIS — J301 Allergic rhinitis due to pollen: Secondary | ICD-10-CM | POA: Diagnosis not present

## 2020-06-15 DIAGNOSIS — H5203 Hypermetropia, bilateral: Secondary | ICD-10-CM | POA: Diagnosis not present

## 2020-06-15 DIAGNOSIS — H26493 Other secondary cataract, bilateral: Secondary | ICD-10-CM | POA: Diagnosis not present

## 2020-06-15 DIAGNOSIS — Z961 Presence of intraocular lens: Secondary | ICD-10-CM | POA: Diagnosis not present

## 2020-06-15 DIAGNOSIS — J3081 Allergic rhinitis due to animal (cat) (dog) hair and dander: Secondary | ICD-10-CM | POA: Diagnosis not present

## 2020-06-15 DIAGNOSIS — J3089 Other allergic rhinitis: Secondary | ICD-10-CM | POA: Diagnosis not present

## 2020-06-22 ENCOUNTER — Encounter: Payer: Self-pay | Admitting: Obstetrics & Gynecology

## 2020-06-22 DIAGNOSIS — Z1231 Encounter for screening mammogram for malignant neoplasm of breast: Secondary | ICD-10-CM | POA: Diagnosis not present

## 2020-06-28 DIAGNOSIS — H26493 Other secondary cataract, bilateral: Secondary | ICD-10-CM | POA: Diagnosis not present

## 2020-06-30 DIAGNOSIS — J301 Allergic rhinitis due to pollen: Secondary | ICD-10-CM | POA: Diagnosis not present

## 2020-06-30 DIAGNOSIS — J3089 Other allergic rhinitis: Secondary | ICD-10-CM | POA: Diagnosis not present

## 2020-07-01 DIAGNOSIS — H26492 Other secondary cataract, left eye: Secondary | ICD-10-CM | POA: Diagnosis not present

## 2020-07-07 DIAGNOSIS — L820 Inflamed seborrheic keratosis: Secondary | ICD-10-CM | POA: Diagnosis not present

## 2020-07-07 DIAGNOSIS — C44712 Basal cell carcinoma of skin of right lower limb, including hip: Secondary | ICD-10-CM | POA: Diagnosis not present

## 2020-07-07 DIAGNOSIS — L821 Other seborrheic keratosis: Secondary | ICD-10-CM | POA: Diagnosis not present

## 2020-07-07 DIAGNOSIS — Z85828 Personal history of other malignant neoplasm of skin: Secondary | ICD-10-CM | POA: Diagnosis not present

## 2020-07-07 DIAGNOSIS — L814 Other melanin hyperpigmentation: Secondary | ICD-10-CM | POA: Diagnosis not present

## 2020-07-07 DIAGNOSIS — L570 Actinic keratosis: Secondary | ICD-10-CM | POA: Diagnosis not present

## 2020-07-08 DIAGNOSIS — J3089 Other allergic rhinitis: Secondary | ICD-10-CM | POA: Diagnosis not present

## 2020-07-08 DIAGNOSIS — J3081 Allergic rhinitis due to animal (cat) (dog) hair and dander: Secondary | ICD-10-CM | POA: Diagnosis not present

## 2020-07-08 DIAGNOSIS — J301 Allergic rhinitis due to pollen: Secondary | ICD-10-CM | POA: Diagnosis not present

## 2020-07-14 ENCOUNTER — Other Ambulatory Visit: Payer: Self-pay | Admitting: Cardiology

## 2020-07-15 DIAGNOSIS — J3089 Other allergic rhinitis: Secondary | ICD-10-CM | POA: Diagnosis not present

## 2020-07-15 DIAGNOSIS — J301 Allergic rhinitis due to pollen: Secondary | ICD-10-CM | POA: Diagnosis not present

## 2020-07-22 ENCOUNTER — Telehealth: Payer: Self-pay | Admitting: Pharmacist

## 2020-07-22 DIAGNOSIS — J301 Allergic rhinitis due to pollen: Secondary | ICD-10-CM | POA: Diagnosis not present

## 2020-07-22 DIAGNOSIS — H10413 Chronic giant papillary conjunctivitis, bilateral: Secondary | ICD-10-CM | POA: Diagnosis not present

## 2020-07-22 DIAGNOSIS — J3081 Allergic rhinitis due to animal (cat) (dog) hair and dander: Secondary | ICD-10-CM | POA: Diagnosis not present

## 2020-07-22 DIAGNOSIS — J3089 Other allergic rhinitis: Secondary | ICD-10-CM | POA: Diagnosis not present

## 2020-07-22 NOTE — Progress Notes (Unsigned)
Cardiology Office Note   Date:  07/23/2020   ID:  [name] [name], DOB Sep 02, 1935, MRN [id number]  PCP:  Hoyt Koch, MD  Cardiologist:   No primary care provider on file.   No chief complaint on file.     History of Present Illness: [name] [name] is a 85 y.o. female who presents for followup of hypertension and palpitations.   She had dyspnea in 2016.  However, POET (Plain Old Exercise Treadmill) was negative for evidence of ischemia.  She returns for follow up.   Since I last saw her she has done well.  She still doing her volunteer work.  [name].  She says she will climb stairs there might get a little short of breath but she does not get short of breath at home.  She is not having any new palpitations, presyncope or syncope.  She had no chest pressure, neck or arm discomfort.  At the last visit she was anxious because her younger brother who just had bypass.  He is doing well.   Past Medical History:  Diagnosis Date  . Anxiety   . Coronary artery spasm (HCC)    Non obstructive CAD  . Diverticulosis of colon   . Fibromyalgia   . Hx of colonic polyps   . Lumbar back pain   . MVP (mitral valve prolapse)    No antibiotics required for procedures  . Osteoporosis   . Palpitations   . Pneumonia   . Shingles     Past Surgical History:  Procedure Laterality Date  . CATARACT EXTRACTION, BILATERAL    . CATARACT EXTRACTION, BILATERAL  05/2018  . CHOLECYSTECTOMY  03/2003  . LUMBAR LAMINECTOMY    . TONSILLECTOMY     as a child     Current Outpatient Medications  Medication Sig Dispense Refill  . ALPRAZolam (XANAX) 0.5 MG tablet TAKE 1-2 TABLETS BY MOUTH 3 TIMES DAILY AS NEEDED FOR ANXIETY. THIS IS 30 DAY SUPPLY. 70 tablet 5  . aspirin 81 MG tablet Take 81 mg by mouth daily.    Marland Kitchen azelastine (ASTELIN) 0.1 % nasal spray 1-2 puffs in each nostril    . BIOTIN 5000 PO Take by mouth 2 (two) times daily.     . calcium carbonate (OS-CAL) 600 MG TABS tablet  Take 600 mg by mouth daily.     . cetirizine (ZYRTEC) 10 MG tablet Take 10 mg by mouth daily.    . Cholecalciferol (VITAMIN D) 2000 units tablet Take 2,000 Units by mouth daily.    Marland Kitchen diltiazem (CARDIZEM CD) 120 MG 24 hr capsule TAKE (1) CAPSULE DAILY. 90 capsule 0  . EPINEPHrine (EPI-PEN) 0.3 mg/0.3 mL DEVI Inject 0.3 mg into the muscle as needed.    . fish oil-omega-3 fatty acids 1000 MG capsule Take 1 g by mouth daily.    . fluticasone (FLONASE) 50 MCG/ACT nasal spray Place 2 sprays into both nostrils daily as needed for allergies or rhinitis.    . folic acid (FOLVITE) 034 MCG tablet Take 800 mcg by mouth daily.    Marland Kitchen ketoconazole (NIZORAL) 2 % shampoo Apply topically.    . Multiple Vitamins-Minerals (QC [employer]HE[employer] VISION SUPPORT 2 PO) Take 1 tablet by mouth daily.    . nitroGLYCERIN (NITROSTAT) 0.4 MG SL tablet Place 1 tablet (0.4 mg total) under the tongue every 5 (five) minutes as needed. May repeat 3 times 25 tablet 2  . Pyridoxine HCl (VITAMIN B6 PO) Take by mouth.    Marland Kitchen  traZODone (DESYREL) 100 MG tablet Take 1 tablet (100 mg total) by mouth at bedtime. 90 tablet 3  . TURMERIC PO Take 1,500 mg by mouth daily.    Marland Kitchen UNABLE TO FIND Allergy shots weekly     No current facility-administered medications for this visit.    Allergies:   Codeine, Erythromycin, Other, and Cefdinir    ROS:  Please see the history of present illness.   Otherwise, review of systems are positive for none.   All other systems are reviewed and negative.    PHYSICAL EXAM: VS:  BP (!) 141/67   Pulse 77   Ht 5' 3.5" (1.613 m)   Wt 117 lb 3.2 oz (53.2 kg)   SpO2 97%   BMI 20.44 kg/m  , BMI Body mass index is 20.44 kg/m. GENERAL:  Well appearing NECK:  No jugular venous distention, waveform within normal limits, carotid upstroke brisk and symmetric, no bruits, no thyromegaly LUNGS:  Clear to auscultation bilaterally CHEST:  Unremarkable HEART:  PMI not displaced or sustained,S1 and S2 within normal limits, no  S3, no S4, no clicks, no rubs, no murmurs ABD:  Flat, positive bowel sounds normal in frequency in pitch, no bruits, no rebound, no guarding, no midline pulsatile mass, no hepatomegaly, no splenomegaly EXT:  2 plus pulses throughout, no edema, no cyanosis no clubbing  EKG:  EKG is  ordered today. The ekg ordered today demonstrates sinus rhythm, rate 77, axis left axis deviation, left ventricular hypertrophy by voltage criteria, poor anterior R wave progression, nonspecific T wave changes, no change from previous.   Recent Labs: 10/21/2019: ALT 24; BUN 10; Creatinine, Ser 0.71; Hemoglobin 14.9; Platelets 265.0; Potassium 4.2; Sodium 133    Lipid Panel    Component Value Date/Time   CHOL 190 10/21/2019 1017   TRIG 76.0 10/21/2019 1017   HDL 87.20 10/21/2019 1017   CHOLHDL 2 10/21/2019 1017   VLDL 15.2 10/21/2019 1017   LDLCALC 88 10/21/2019 1017   LDLDIRECT 79.4 06/29/2010 0803      Wt Readings from Last 3 Encounters:  07/23/20 117 lb 3.2 oz (53.2 kg)  01/29/20 114 lb 12.8 oz (52.1 kg)  11/26/19 110 lb (49.9 kg)      Other studies Reviewed: Additional studies/ records that were reviewed today include: None Review of the above records demonstrates:  Please see elsewhere in the note.     ASSESSMENT AND PLAN:  PALPITATIONS:   She is not really noticing these.  No change in therapy.  HTN: Her blood pressure is mildly elevated but she does not want to start checking it at home and its been well controlled typically.  No change in therapy.   ABNORMAL EKG:   She has poor anterior R wave progression in the talked about doing a POET (Plain Old Exercise Treadmill).  However, in the absence of symptoms she would like to avoid this.    Current medicines are reviewed at length with the patient today.  The patient does not have concerns regarding medicines.  The following changes have been made:  None  Labs/ tests ordered today include: None  Orders Placed This Encounter   Procedures  . EKG 12-Lead     Disposition:   FU with me in 12 months.      Signed, Minus Breeding, MD  07/23/2020 10:28 AM    [facility]

## 2020-07-22 NOTE — Chronic Care Management (AMB) (Unsigned)
Chronic Care Management Pharmacy Assistant   Name: [name] [name]  MRN: [id number] DOB: 11-24-1935    Reason for Encounter: Disease State    Recent office visits:  None noted  Recent consult visits:  None noted  Hospital visits:  None in previous 6 months  Medications: Outpatient Encounter Medications as of 07/22/2020  Medication Sig  . ALPRAZolam (XANAX) 0.5 MG tablet TAKE 1-2 TABLETS BY MOUTH 3 TIMES DAILY AS NEEDED FOR ANXIETY. THIS IS 30 DAY SUPPLY.  Marland Kitchen aspirin 81 MG tablet Take 81 mg by mouth daily.    Marland Kitchen BIOTIN 5000 PO Take by mouth 2 (two) times daily.   . calcium carbonate (CALCIUM 600) 600 MG TABS Take 600 mg by mouth daily.   . cetirizine (ZYRTEC) 10 MG tablet Take 10 mg by mouth daily.    . Cholecalciferol (VITAMIN D) 2000 units tablet Take 2,000 Units by mouth daily.  Marland Kitchen diltiazem (CARDIZEM CD) 120 MG 24 hr capsule TAKE (1) CAPSULE DAILY.  Marland Kitchen EPINEPHrine (EPI-PEN) 0.3 mg/0.3 mL DEVI Inject 0.3 mg into the muscle as needed.  . fish oil-omega-3 fatty acids 1000 MG capsule Take 1 g by mouth daily.    . fluticasone (FLONASE) 50 MCG/ACT nasal spray Place 2 sprays into both nostrils daily as needed for allergies or rhinitis.  . folic acid (FOLVITE) 211 MCG tablet Take 800 mcg by mouth daily.  . Multiple Vitamins-Minerals (QC [employer]HE[employer] VISION SUPPORT 2 PO) Take 1 tablet by mouth daily.  . nitroGLYCERIN (NITROSTAT) 0.4 MG SL tablet Place 1 tablet (0.4 mg total) under the tongue every 5 (five) minutes as needed. May repeat 3 times  . Pyridoxine HCl (VITAMIN B6 PO) Take by mouth.  . traZODone (DESYREL) 100 MG tablet Take 1 tablet (100 mg total) by mouth at bedtime.  . TURMERIC PO Take 1,500 mg by mouth daily.  Marland Kitchen UNABLE TO FIND Allergy shots weekly   No facility-administered encounter medications on file as of 07/22/2020.   Reviewed chart prior to disease state call. Spoke with patient regarding BP  Recent Office Vitals: BP Readings from Last 3 Encounters:  01/29/20 (!)  146/72  11/26/19 122/78  10/21/19 126/84   Pulse Readings from Last 3 Encounters:  01/29/20 80  10/21/19 94  01/28/19 92    Wt Readings from Last 3 Encounters:  01/29/20 114 lb 12.8 oz (52.1 kg)  11/26/19 110 lb (49.9 kg)  10/21/19 108 lb (49 kg)     Kidney Function Lab Results  Component Value Date/Time   CREATININE 0.71 10/21/2019 10:17 AM   CREATININE 0.68 10/14/2018 08:31 AM   GFR 78.73 10/21/2019 10:17 AM   GFRNONAA 106.38 09/28/2009 03:47 PM   GFRAA 106 06/16/2008 09:05 AM    BMP Latest Ref Rng & Units 10/21/2019 10/14/2018 08/21/2017  Glucose 70 - 99 mg/dL 100(H) 75 83  BUN 6 - 23 mg/dL 10 7 8   Creatinine 0.40 - 1.20 mg/dL 0.71 0.68 0.62  Sodium 135 - 145 mEq/L 133(L) 135 136  Potassium 3.5 - 5.1 mEq/L 4.2 4.4 4.0  Chloride 96 - 112 mEq/L 97 97 100  CO2 19 - 32 mEq/L 29 25 28   Calcium 8.4 - 10.5 mg/dL 10.1 9.4 9.3    . Current antihypertensive regimen:  o Diltiazem 24H ER 120 mg o  . How often are you checking your Blood Pressure? {CHL HP BP Monitoring [phone]}  .  Marland Kitchen Current home BP readings: *** .  . What recent interventions/DTPs have been made by  any provider to improve Blood Pressure control since last CPP Visit: None noted .  Marland Kitchen Any recent hospitalizations or ED visits since last visit with CPP? No  .  . What diet changes have been made to improve Blood Pressure Control?  o *** . What exercise is being done to improve your Blood Pressure Control?  o ***  Adherence Review: Is the patient currently on ACE/ARB medication? No Does the patient have >5 day gap between last estimated fill dates? Yes  Diltiazem 24H ER 120mg  last filled 04/13/2020 90DS   [place]  Star Rating Drugs: None

## 2020-07-23 ENCOUNTER — Ambulatory Visit (INDEPENDENT_AMBULATORY_CARE_PROVIDER_SITE_OTHER): Payer: Medicare Other | Admitting: Cardiology

## 2020-07-23 ENCOUNTER — Other Ambulatory Visit: Payer: Self-pay

## 2020-07-23 ENCOUNTER — Encounter: Payer: Self-pay | Admitting: Cardiology

## 2020-07-23 VITALS — BP 141/67 | HR 77 | Ht 63.5 in | Wt 117.2 lb

## 2020-07-23 DIAGNOSIS — I10 Essential (primary) hypertension: Secondary | ICD-10-CM | POA: Diagnosis not present

## 2020-07-23 DIAGNOSIS — R9431 Abnormal electrocardiogram [ECG] [EKG]: Secondary | ICD-10-CM

## 2020-07-23 DIAGNOSIS — R002 Palpitations: Secondary | ICD-10-CM | POA: Diagnosis not present

## 2020-07-23 NOTE — Patient Instructions (Addendum)
Medication Instructions:  Your physician recommends that you continue on your current medications as directed. Please refer to the Current Medication list given to you today.  *If you need a refill on your cardiac medications before your next appointment, please call your pharmacy*  Lab Work: NONE ordered at this time of appointment   If you have labs (blood work) drawn today and your tests are completely normal, you will receive your results only by: Marland Kitchen MyChart Message (if you have MyChart) OR . A paper copy in the mail If you have any lab test that is abnormal or we need to change your treatment, we will call you to review the results.  Testing/Procedures: NONE ordered at this time of appointment   Follow-Up: At [facility], you and your health needs are our priority.  As part of our continuing mission to provide you with exceptional heart care, we have created designated Provider Care Teams.  These Care Teams include your primary Cardiologist (physician) and Advanced Practice Providers (APPs -  Physician Assistants and Nurse Practitioners) who all work together to provide you with the care you need, when you need it.  Your next appointment:   1 year(s)  The format for your next appointment:   In Person  Provider:   Minus Breeding, MD  Other Instructions

## 2020-07-29 DIAGNOSIS — J301 Allergic rhinitis due to pollen: Secondary | ICD-10-CM | POA: Diagnosis not present

## 2020-07-29 DIAGNOSIS — J3089 Other allergic rhinitis: Secondary | ICD-10-CM | POA: Diagnosis not present

## 2020-08-04 DIAGNOSIS — J3089 Other allergic rhinitis: Secondary | ICD-10-CM | POA: Diagnosis not present

## 2020-08-04 DIAGNOSIS — J301 Allergic rhinitis due to pollen: Secondary | ICD-10-CM | POA: Diagnosis not present

## 2020-08-04 DIAGNOSIS — J3081 Allergic rhinitis due to animal (cat) (dog) hair and dander: Secondary | ICD-10-CM | POA: Diagnosis not present

## 2020-08-09 ENCOUNTER — Telehealth: Payer: Medicare Other

## 2020-08-12 DIAGNOSIS — Z23 Encounter for immunization: Secondary | ICD-10-CM | POA: Diagnosis not present

## 2020-08-13 ENCOUNTER — Telehealth: Payer: Self-pay | Admitting: Pharmacist

## 2020-08-13 NOTE — Chronic Care Management (AMB) (Signed)
Chronic Care Management Pharmacy Assistant   Name: [name] [name]  MRN: [id number] DOB: August 15, 1935   Reason for Encounter: Hypertension Disease State Call   Conditions to be addressed/monitored: HTN   Recent office visits:  None ID  Recent consult visits:  07/23/20 Dr. [facility]erc[facility], no medication changes  Hospital visits:  None in previous 6 months  Medications: Outpatient Encounter Medications as of 08/13/2020  Medication Sig  . ALPRAZolam (XANAX) 0.5 MG tablet TAKE 1-2 TABLETS BY MOUTH 3 TIMES DAILY AS NEEDED FOR ANXIETY. THIS IS 30 DAY SUPPLY.  Marland Kitchen aspirin 81 MG tablet Take 81 mg by mouth daily.  Marland Kitchen azelastine (ASTELIN) 0.1 % nasal spray 1-2 puffs in each nostril  . BIOTIN 5000 PO Take by mouth 2 (two) times daily.   . calcium carbonate (OS-CAL) 600 MG TABS tablet Take 600 mg by mouth daily.   . cetirizine (ZYRTEC) 10 MG tablet Take 10 mg by mouth daily.  . Cholecalciferol (VITAMIN D) 2000 units tablet Take 2,000 Units by mouth daily.  Marland Kitchen diltiazem (CARDIZEM CD) 120 MG 24 hr capsule TAKE (1) CAPSULE DAILY.  Marland Kitchen EPINEPHrine (EPI-PEN) 0.3 mg/0.3 mL DEVI Inject 0.3 mg into the muscle as needed.  . fish oil-omega-3 fatty acids 1000 MG capsule Take 1 g by mouth daily.  . fluticasone (FLONASE) 50 MCG/ACT nasal spray Place 2 sprays into both nostrils daily as needed for allergies or rhinitis.  . folic acid (FOLVITE) 659 MCG tablet Take 800 mcg by mouth daily.  Marland Kitchen ketoconazole (NIZORAL) 2 % shampoo Apply topically.  . Multiple Vitamins-Minerals (QC [employer]HE[employer] VISION SUPPORT 2 PO) Take 1 tablet by mouth daily.  . nitroGLYCERIN (NITROSTAT) 0.4 MG SL tablet Place 1 tablet (0.4 mg total) under the tongue every 5 (five) minutes as needed. May repeat 3 times  . Pyridoxine HCl (VITAMIN B6 PO) Take by mouth.  . traZODone (DESYREL) 100 MG tablet Take 1 tablet (100 mg total) by mouth at bedtime.  . TURMERIC PO Take 1,500 mg by mouth daily.  Marland Kitchen UNABLE TO FIND Allergy shots weekly   No  facility-administered encounter medications on file as of 08/13/2020.   Reviewed chart prior to disease state call. Spoke with patient regarding BP  Recent Office Vitals: BP Readings from Last 3 Encounters:  07/23/20 (!) 141/67  01/29/20 (!) 146/72  11/26/19 122/78   Pulse Readings from Last 3 Encounters:  07/23/20 77  01/29/20 80  10/21/19 94    Wt Readings from Last 3 Encounters:  07/23/20 117 lb 3.2 oz (53.2 kg)  01/29/20 114 lb 12.8 oz (52.1 kg)  11/26/19 110 lb (49.9 kg)     Kidney Function Lab Results  Component Value Date/Time   CREATININE 0.71 10/21/2019 10:17 AM   CREATININE 0.68 10/14/2018 08:31 AM   GFR 78.73 10/21/2019 10:17 AM   GFRNONAA 106.38 09/28/2009 03:47 PM   GFRAA 106 06/16/2008 09:05 AM    BMP Latest Ref Rng & Units 10/21/2019 10/14/2018 08/21/2017  Glucose 70 - 99 mg/dL 100(H) 75 83  BUN 6 - 23 mg/dL 10 7 8   Creatinine 0.40 - 1.20 mg/dL 0.71 0.68 0.62  Sodium 135 - 145 mEq/L 133(L) 135 136  Potassium 3.5 - 5.1 mEq/L 4.2 4.4 4.0  Chloride 96 - 112 mEq/L 97 97 100  CO2 19 - 32 mEq/L 29 25 28   Calcium 8.4 - 10.5 mg/dL 10.1 9.4 9.3    . Current antihypertensive regimen: Diltiazem 120 mg 1 cap daily  . How often are  you checking your Blood Pressure? Patient states that she does not check blood pressure at home, but will try and find one that her husband used so that she can start checking her own  . Current home BP readings: Last blood pressure reading at Dr. Percival Spanish was 14//67  . What recent interventions/DTPs have been made by any provider to improve Blood Pressure control since last CPP Visit: None ID  . Any recent hospitalizations or ED visits since last visit with CPP? Patient states she has not been to the hospital or ED for any visits  . What diet changes have been made to improve Blood Pressure Control?  Patient states that she stay away from salt and fried foods, states that she usually eats very healthy  . What exercise is being done to  improve your Blood Pressure Control?  Patient states that she is starting back at the gym on Monday  Adherence Review: Is the patient currently on ACE/ARB medication? No Does the patient have >5 day gap between last estimated fill dates? No  Star Rating Drugs: None ID  Ethelene Hal Clinical Pharmacist Assistant [phone]  Time spent:30

## 2020-08-19 DIAGNOSIS — J301 Allergic rhinitis due to pollen: Secondary | ICD-10-CM | POA: Diagnosis not present

## 2020-08-19 DIAGNOSIS — J3089 Other allergic rhinitis: Secondary | ICD-10-CM | POA: Diagnosis not present

## 2020-08-27 DIAGNOSIS — J3089 Other allergic rhinitis: Secondary | ICD-10-CM | POA: Diagnosis not present

## 2020-09-02 DIAGNOSIS — J3089 Other allergic rhinitis: Secondary | ICD-10-CM | POA: Diagnosis not present

## 2020-09-02 DIAGNOSIS — J301 Allergic rhinitis due to pollen: Secondary | ICD-10-CM | POA: Diagnosis not present

## 2020-09-04 DIAGNOSIS — J301 Allergic rhinitis due to pollen: Secondary | ICD-10-CM | POA: Diagnosis not present

## 2020-09-08 DIAGNOSIS — J3089 Other allergic rhinitis: Secondary | ICD-10-CM | POA: Diagnosis not present

## 2020-09-08 DIAGNOSIS — J301 Allergic rhinitis due to pollen: Secondary | ICD-10-CM | POA: Diagnosis not present

## 2020-09-16 DIAGNOSIS — J3089 Other allergic rhinitis: Secondary | ICD-10-CM | POA: Diagnosis not present

## 2020-09-16 DIAGNOSIS — J301 Allergic rhinitis due to pollen: Secondary | ICD-10-CM | POA: Diagnosis not present

## 2020-09-23 DIAGNOSIS — J301 Allergic rhinitis due to pollen: Secondary | ICD-10-CM | POA: Diagnosis not present

## 2020-09-23 DIAGNOSIS — J3089 Other allergic rhinitis: Secondary | ICD-10-CM | POA: Diagnosis not present

## 2020-09-30 DIAGNOSIS — J3089 Other allergic rhinitis: Secondary | ICD-10-CM | POA: Diagnosis not present

## 2020-09-30 DIAGNOSIS — J301 Allergic rhinitis due to pollen: Secondary | ICD-10-CM | POA: Diagnosis not present

## 2020-10-01 ENCOUNTER — Telehealth: Payer: Self-pay | Admitting: Pharmacist

## 2020-10-01 NOTE — Progress Notes (Signed)
Chronic Care Management Pharmacy Assistant   Name: [name] [name]  MRN: [id number] DOB: [birth year]  Reason for Encounter: Disease State - Hypertension   Recent office visits:  None noted  Recent consult visits:  07/23/20 [facility] (Cardiology) - Palpitations. EKG done. F/u 1 yr.  04/08/20 [facility] (Allergy & Immuno) - Allergic rhinitis due to pollen.  Start Multiple Immunotherapy Injections.  Hospital visits:  None in previous 6 months  Medications: Outpatient Encounter Medications as of 10/01/2020  Medication Sig  . ALPRAZolam (XANAX) 0.5 MG tablet TAKE 1-2 TABLETS BY MOUTH 3 TIMES DAILY AS NEEDED FOR ANXIETY. THIS IS 30 DAY SUPPLY.  Marland Kitchen aspirin 81 MG tablet Take 81 mg by mouth daily.  Marland Kitchen azelastine (ASTELIN) 0.1 % nasal spray 1-2 puffs in each nostril  . BIOTIN 5000 PO Take by mouth 2 (two) times daily.   . calcium carbonate (OS-CAL) 600 MG TABS tablet Take 600 mg by mouth daily.   . cetirizine (ZYRTEC) 10 MG tablet Take 10 mg by mouth daily.  . Cholecalciferol (VITAMIN D) 2000 units tablet Take 2,000 Units by mouth daily.  Marland Kitchen diltiazem (CARDIZEM CD) 120 MG 24 hr capsule TAKE (1) CAPSULE DAILY.  Marland Kitchen EPINEPHrine (EPI-PEN) 0.3 mg/0.3 mL DEVI Inject 0.3 mg into the muscle as needed.  . fish oil-omega-3 fatty acids 1000 MG capsule Take 1 g by mouth daily.  . fluticasone (FLONASE) 50 MCG/ACT nasal spray Place 2 sprays into both nostrils daily as needed for allergies or rhinitis.  . folic acid (FOLVITE) 762 MCG tablet Take 800 mcg by mouth daily.  Marland Kitchen ketoconazole (NIZORAL) 2 % shampoo Apply topically.  . Multiple Vitamins-Minerals (QC O[employer]HE[employer] VISION SUPPORT 2 PO) Take 1 tablet by mouth daily.  . nitroGLYCERIN (NITROSTAT) 0.4 MG SL tablet Place 1 tablet (0.4 mg total) under the tongue every 5 (five) minutes as needed. May repeat 3 times  . Pyridoxine HCl (VITAMIN B6 PO) Take by mouth.  . traZODone (DESYREL) 100 MG tablet Take 1 tablet (100 mg total) by mouth at bedtime.  . TURMERIC PO  Take 1,500 mg by mouth daily.  Marland Kitchen UNABLE TO FIND Allergy shots weekly   No facility-administered encounter medications on file as of 10/01/2020.   Reviewed chart prior to disease state call. Spoke with patient regarding BP  Recent Office Vitals: BP Readings from Last 3 Encounters:  07/23/20 (!) 141/67  01/29/20 (!) 146/72  11/26/19 122/78   Pulse Readings from Last 3 Encounters:  07/23/20 77  01/29/20 80  10/21/19 94    Wt Readings from Last 3 Encounters:  07/23/20 117 lb 3.2 oz (53.2 kg)  01/29/20 114 lb 12.8 oz (52.1 kg)  11/26/19 110 lb (49.9 kg)     Kidney Function Lab Results  Component Value Date/Time   CREATININE 0.71 10/21/2019 10:17 AM   CREATININE 0.68 10/14/2018 08:31 AM   GFR 78.73 10/21/2019 10:17 AM   GFRNONAA 106.38 09/28/2009 03:47 PM   GFRAA 106 06/16/2008 09:05 AM    BMP Latest Ref Rng & Units 10/21/2019 10/14/2018 08/21/2017  Glucose 70 - 99 mg/dL 100(H) 75 83  BUN 6 - 23 mg/dL 10 7 8   Creatinine 0.40 - 1.20 mg/dL 0.71 0.68 0.62  Sodium 135 - 145 mEq/L 133(L) 135 136  Potassium 3.5 - 5.1 mEq/L 4.2 4.4 4.0  Chloride 96 - 112 mEq/L 97 97 100  CO2 19 - 32 mEq/L 29 25 28   Calcium 8.4 - 10.5 mg/dL 10.1 9.4 9.3   Reviewed chart for  medication changes and drug therapy problems ahead of medication adherence call.  Attempted to contact patient x 3 for medication review and health check, unable to reach patient, left voicemails to return call.  Adherence Review: Is the patient currently on ACE/ARB medication? No Does the patient have >5 day gap between last estimated fill dates? No Diltiazem- last fill 07/14/20 90D  Star Rating Drugs: N/A   Orinda Kenner, RMA Clinical Pharmacists Assistant [phone]  Time Spent: [phone]

## 2020-10-11 ENCOUNTER — Other Ambulatory Visit: Payer: Self-pay | Admitting: Cardiology

## 2020-10-11 NOTE — Telephone Encounter (Signed)
  Patient requesting a call back today after 2:15

## 2020-10-11 NOTE — Progress Notes (Addendum)
Reviewed chart prior to disease state call. Spoke with patient regarding BP  Recent Office Vitals: BP Readings from Last 3 Encounters:  07/23/20 (!) 141/67  01/29/20 (!) 146/72  11/26/19 122/78   Pulse Readings from Last 3 Encounters:  07/23/20 77  01/29/20 80  10/21/19 94    Wt Readings from Last 3 Encounters:  07/23/20 117 lb 3.2 oz (53.2 kg)  01/29/20 114 lb 12.8 oz (52.1 kg)  11/26/19 110 lb (49.9 kg)     Kidney Function Lab Results  Component Value Date/Time   CREATININE 0.71 10/21/2019 10:17 AM   CREATININE 0.68 10/14/2018 08:31 AM   GFR 78.73 10/21/2019 10:17 AM   GFRNONAA 106.38 09/28/2009 03:47 PM   GFRAA 106 06/16/2008 09:05 AM    BMP Latest Ref Rng & Units 10/21/2019 10/14/2018 08/21/2017  Glucose 70 - 99 mg/dL 100(H) 75 83  BUN 6 - 23 mg/dL 10 7 8   Creatinine 0.40 - 1.20 mg/dL 0.71 0.68 0.62  Sodium 135 - 145 mEq/L 133(L) 135 136  Potassium 3.5 - 5.1 mEq/L 4.2 4.4 4.0  Chloride 96 - 112 mEq/L 97 97 100  CO2 19 - 32 mEq/L 29 25 28   Calcium 8.4 - 10.5 mg/dL 10.1 9.4 9.3    Current antihypertensive regimen:  Diltiazem- last fill 07/14/20 90D  Adherence Review: Is the patient currently on ACE/ARB medication?  Does the patient have >5 day gap between last estimated fill dates?  Diltiazem- last fill 07/14/20 [place], RMA Clinical Pharmacists Assistant [phone]  Time Spent: 5

## 2020-10-14 DIAGNOSIS — J3089 Other allergic rhinitis: Secondary | ICD-10-CM | POA: Diagnosis not present

## 2020-10-14 DIAGNOSIS — J301 Allergic rhinitis due to pollen: Secondary | ICD-10-CM | POA: Diagnosis not present

## 2020-10-21 DIAGNOSIS — J3089 Other allergic rhinitis: Secondary | ICD-10-CM | POA: Diagnosis not present

## 2020-10-21 DIAGNOSIS — J301 Allergic rhinitis due to pollen: Secondary | ICD-10-CM | POA: Diagnosis not present

## 2020-10-27 NOTE — Progress Notes (Signed)
    Chronic Care Management Pharmacy Assistant   Name: [name] [name]  MRN: [id number] DOB: Sep 15, 1935   Reason for Encounter: Chart Review    Medications: Outpatient Encounter Medications as of 10/01/2020  Medication Sig   ALPRAZolam (XANAX) 0.5 MG tablet TAKE 1-2 TABLETS BY MOUTH 3 TIMES DAILY AS NEEDED FOR ANXIETY. THIS IS 30 DAY SUPPLY.   aspirin 81 MG tablet Take 81 mg by mouth daily.   azelastine (ASTELIN) 0.1 % nasal spray 1-2 puffs in each nostril   BIOTIN 5000 PO Take by mouth 2 (two) times daily.    calcium carbonate (OS-CAL) 600 MG TABS tablet Take 600 mg by mouth daily.    cetirizine (ZYRTEC) 10 MG tablet Take 10 mg by mouth daily.   Cholecalciferol (VITAMIN D) 2000 units tablet Take 2,000 Units by mouth daily.   EPINEPHrine (EPI-PEN) 0.3 mg/0.3 mL DEVI Inject 0.3 mg into the muscle as needed.   fish oil-omega-3 fatty acids 1000 MG capsule Take 1 g by mouth daily.   fluticasone (FLONASE) 50 MCG/ACT nasal spray Place 2 sprays into both nostrils daily as needed for allergies or rhinitis.   folic acid (FOLVITE) 725 MCG tablet Take 800 mcg by mouth daily.   ketoconazole (NIZORAL) 2 % shampoo Apply topically.   Multiple Vitamins-Minerals (QC OCUHE[employer] VISION SUPPORT 2 PO) Take 1 tablet by mouth daily.   nitroGLYCERIN (NITROSTAT) 0.4 MG SL tablet Place 1 tablet (0.4 mg total) under the tongue every 5 (five) minutes as needed. May repeat 3 times   Pyridoxine HCl (VITAMIN B6 PO) Take by mouth.   traZODone (DESYREL) 100 MG tablet Take 1 tablet (100 mg total) by mouth at bedtime.   TURMERIC PO Take 1,500 mg by mouth daily.   UNABLE TO FIND Allergy shots weekly   [DISCONTINUED] diltiazem (CARDIZEM CD) 120 MG 24 hr capsule TAKE (1) CAPSULE DAILY.   No facility-administered encounter medications on file as of 10/01/2020.   Pharmacist Review  Reviewed chart for medication changes and adherence.  No OVs, Consults, or hospital visits since last care coordination call / Pharmacist  visit. No medication changes indicated  No gaps in adherence identified. Patient has follow up scheduled with pharmacy team. No further action required.   [place] Pharmacist Assistant [phone]   Time spent:3

## 2020-10-28 DIAGNOSIS — J3089 Other allergic rhinitis: Secondary | ICD-10-CM | POA: Diagnosis not present

## 2020-10-28 DIAGNOSIS — J301 Allergic rhinitis due to pollen: Secondary | ICD-10-CM | POA: Diagnosis not present

## 2020-11-03 ENCOUNTER — Telehealth: Payer: Self-pay

## 2020-11-03 ENCOUNTER — Encounter: Payer: Self-pay | Admitting: Internal Medicine

## 2020-11-03 ENCOUNTER — Other Ambulatory Visit: Payer: Self-pay

## 2020-11-03 ENCOUNTER — Ambulatory Visit (INDEPENDENT_AMBULATORY_CARE_PROVIDER_SITE_OTHER): Payer: Medicare Other | Admitting: Internal Medicine

## 2020-11-03 VITALS — BP 124/88 | HR 91 | Temp 98.1°F | Resp 18 | Ht 63.5 in | Wt 108.0 lb

## 2020-11-03 DIAGNOSIS — Z0000 Encounter for general adult medical examination without abnormal findings: Secondary | ICD-10-CM | POA: Diagnosis not present

## 2020-11-03 DIAGNOSIS — R5383 Other fatigue: Secondary | ICD-10-CM

## 2020-11-03 DIAGNOSIS — I10 Essential (primary) hypertension: Secondary | ICD-10-CM

## 2020-11-03 DIAGNOSIS — E538 Deficiency of other specified B group vitamins: Secondary | ICD-10-CM

## 2020-11-03 DIAGNOSIS — F411 Generalized anxiety disorder: Secondary | ICD-10-CM | POA: Diagnosis not present

## 2020-11-03 DIAGNOSIS — H900 Conductive hearing loss, bilateral: Secondary | ICD-10-CM

## 2020-11-03 DIAGNOSIS — L609 Nail disorder, unspecified: Secondary | ICD-10-CM

## 2020-11-03 DIAGNOSIS — E2839 Other primary ovarian failure: Secondary | ICD-10-CM

## 2020-11-03 DIAGNOSIS — F5101 Primary insomnia: Secondary | ICD-10-CM

## 2020-11-03 DIAGNOSIS — E559 Vitamin D deficiency, unspecified: Secondary | ICD-10-CM

## 2020-11-03 LAB — COMPREHENSIVE METABOLIC PANEL WITH GFR
ALT: 24 U/L (ref 0–35)
AST: 28 U/L (ref 0–37)
Albumin: 4.5 g/dL (ref 3.5–5.2)
Alkaline Phosphatase: 99 U/L (ref 39–117)
BUN: 11 mg/dL (ref 6–23)
CO2: 29 meq/L (ref 19–32)
Calcium: 10.5 mg/dL (ref 8.4–10.5)
Chloride: 97 meq/L (ref 96–112)
Creatinine, Ser: 0.81 mg/dL (ref 0.40–1.20)
GFR: 67.11 mL/min
Glucose, Bld: 97 mg/dL (ref 70–99)
Potassium: 4.1 meq/L (ref 3.5–5.1)
Sodium: 137 meq/L (ref 135–145)
Total Bilirubin: 0.5 mg/dL (ref 0.2–1.2)
Total Protein: 7.7 g/dL (ref 6.0–8.3)

## 2020-11-03 LAB — LIPID PANEL
Cholesterol: 193 mg/dL (ref 0–200)
HDL: 89.9 mg/dL (ref >39.00)
LDL Cholesterol: 85 mg/dL (ref 0–99)
NonHDL: 103.58
Total CHOL/HDL Ratio: 2
Triglycerides: 91 mg/dL (ref 0.0–149.0)
VLDL: 18.2 mg/dL (ref 0.0–40.0)

## 2020-11-03 LAB — VITAMIN D 25 HYDROXY (VIT D DEFICIENCY, FRACTURES): VITD: >120 ng/mL

## 2020-11-03 LAB — TSH: TSH: 2.33 u[IU]/mL (ref 0.35–5.50)

## 2020-11-03 LAB — CBC
HCT: 46.1 % — ABNORMAL HIGH (ref 36.0–46.0)
Hemoglobin: 15.8 g/dL — ABNORMAL HIGH (ref 12.0–15.0)
MCHC: 34.2 g/dL (ref 30.0–36.0)
MCV: 99.4 fl (ref 78.0–100.0)
Platelets: 263 K/uL (ref 150.0–400.0)
RBC: 4.64 Mil/uL (ref 3.87–5.11)
RDW: 12.7 % (ref 11.5–15.5)
WBC: 6.1 K/uL (ref 4.0–10.5)

## 2020-11-03 LAB — VITAMIN B12: Vitamin B-12: 267 pg/mL (ref 211–911)

## 2020-11-03 MED ORDER — ALPRAZOLAM 0.5 MG PO TABS: ORAL_TABLET | ORAL | 5 refills | Status: DC

## 2020-11-03 MED ORDER — TRAZODONE HCL 100 MG PO TABS: 100.0000 mg | ORAL_TABLET | Freq: Every day | ORAL | 3 refills | Status: DC

## 2020-11-03 NOTE — Patient Instructions (Signed)
We will get the bone density done this November.   We will check the labs today.  Vaccines.gov you can search by zip code for the covid-19 vaccine close to you.

## 2020-11-03 NOTE — Telephone Encounter (Signed)
Received a call from [facility] lab stating that the patient has a critical value of Vitamin D greater than 120.

## 2020-11-03 NOTE — Progress Notes (Signed)
Subjective:   Patient ID: [name] [name], female    DOB: Aug 23, 1935, 85 y.o.   MRN: [id number]  HPI Here for medicare wellness, no new complaints. Please see A/P for status and treatment of chronic medical problems.   HPI #2:Here for follow up anxiety (using xanax up to TID prn, denies side effects or taking too much, still working okay to help with QOL and function) and blood pressure (taking diltiazem and BP at goal at home, has readings, no chest pains or headaches), and sleeping (using trazodone for sleep, overall still working well)  Diet: heart healthy Physical activity: sedentary, gym a couple times a week Depression/mood screen: negative Hearing: moderate to severe loss bilateral, hearing aids bilateral Visual acuity: grossly normal with lens, performs annual eye exam  ADLs: capable Fall risk: none Home safety: good Cognitive evaluation: intact to orientation, naming, recall and repetition EOL planning: adv directives discussed, in place  [employer] Visit from 11/03/2020 in [place] at [facility] Total Score 0       I have personally reviewed and have noted 1. The patient's medical and social history - reviewed today no changes 2. Their use of alcohol, tobacco or illicit drugs 3. Their current medications and supplements 4. The patient's functional ability including ADL's, fall risks, home safety risks and hearing or visual impairment. 5. Diet and physical activities 6. Evidence for depression or mood disorders 7. Care team reviewed and updated 8.  The patient is not on an opioid pain medication.  Patient Care Team: Hoyt Koch, MD as PCP - General (Internal Medicine) Charlton Haws, [facility] as Pharmacist (Pharmacist) Past Medical History:  Diagnosis Date   Anxiety    Coronary artery spasm [facility])    Non obstructive CAD   Diverticulosis of colon    Fibromyalgia    Hx of colonic polyps    Lumbar back pain    MVP (mitral  valve prolapse)    No antibiotics required for procedures   Osteoporosis    Palpitations    Pneumonia    Shingles    Past Surgical History:  Procedure Laterality Date   CATARACT EXTRACTION, BILATERAL     CATARACT EXTRACTION, BILATERAL  05/2018   CHOLECYSTECTOMY  03/2003   LUMBAR LAMINECTOMY     TONSILLECTOMY     as a child   Family History  Problem Relation Age of Onset   Rheum arthritis Mother    Stroke Mother    Hypertension Mother    Heart failure Father    Heart disease Father    Asthma Maternal Grandfather    Allergies Maternal Grandfather    COPD Maternal Aunt         2 aunts   Hypertension Brother    Review of Systems  Constitutional: Negative.   HENT:  Positive for hearing loss.   Eyes: Negative.   Respiratory:  Negative for cough, chest tightness and shortness of breath.   Cardiovascular:  Negative for chest pain, palpitations and leg swelling.  Gastrointestinal:  Negative for abdominal distention, abdominal pain, constipation, diarrhea, nausea and vomiting.  Musculoskeletal: Negative.   Skin: Negative.   Neurological: Negative.   Psychiatric/Behavioral: Negative.     Objective:  Physical Exam Constitutional:      Appearance: She is well-developed.  HENT:     Head: Normocephalic and atraumatic.  Cardiovascular:     Rate and Rhythm: Normal rate and regular rhythm.  Pulmonary:     Effort: Pulmonary effort is  normal. No respiratory distress.     Breath sounds: Normal breath sounds. No wheezing or rales.  Abdominal:     General: Bowel sounds are normal. There is no distension.     Palpations: Abdomen is soft.     Tenderness: There is no abdominal tenderness. There is no rebound.  Musculoskeletal:     Cervical back: Normal range of motion.  Skin:    General: Skin is warm and dry.  Neurological:     Mental Status: She is alert and oriented to person, place, and time.     Coordination: Coordination normal.    Vitals:   11/03/20 0839  BP: 124/88   Pulse: 91  Resp: 18  Temp: 98.1 F (36.7 C)  TempSrc: Oral  SpO2: 98%  Weight: 108 lb (49 kg)  Height: 5' 3.5" (1.613 m)   This visit occurred during the SARS-CoV-2 public health emergency.  Safety protocols were in place, including screening questions prior to the visit, additional usage of staff PPE, and extensive cleaning of exam room while observing appropriate contact time as indicated for disinfecting solutions.   Assessment & Plan:

## 2020-11-04 DIAGNOSIS — J3081 Allergic rhinitis due to animal (cat) (dog) hair and dander: Secondary | ICD-10-CM | POA: Diagnosis not present

## 2020-11-04 DIAGNOSIS — J301 Allergic rhinitis due to pollen: Secondary | ICD-10-CM | POA: Diagnosis not present

## 2020-11-04 DIAGNOSIS — J3089 Other allergic rhinitis: Secondary | ICD-10-CM | POA: Diagnosis not present

## 2020-11-04 NOTE — Telephone Encounter (Signed)
Addressed via result notes. In future, critical results need to go to provider in office in case emergent.

## 2020-11-05 DIAGNOSIS — H900 Conductive hearing loss, bilateral: Secondary | ICD-10-CM | POA: Insufficient documentation

## 2020-11-05 NOTE — Assessment & Plan Note (Signed)
Referral to audiology.

## 2020-11-05 NOTE — Assessment & Plan Note (Signed)
BP at goal on diltiazem and checking CMP and adjust as needed.

## 2020-11-05 NOTE — Assessment & Plan Note (Signed)
Flu shot yearly. Covid-19 counseled about boosters. Pneumonia complete. Shingrix complete. Tetanus due 2030. Colonoscopy aged out. Mammogram aged out, pap smear aged out and dexa due ordered. Counseled about sun safety and mole surveillance. Counseled about the dangers of distracted driving. Given 10 year screening recommendations.

## 2020-11-05 NOTE — Assessment & Plan Note (Signed)
Taking xanax up to TID prn #70 per month. Database reviewed and no inappropriate fills.

## 2020-11-05 NOTE — Assessment & Plan Note (Signed)
Using trazodone and doing well for sleep.

## 2020-11-11 DIAGNOSIS — J3089 Other allergic rhinitis: Secondary | ICD-10-CM | POA: Diagnosis not present

## 2020-11-11 DIAGNOSIS — J301 Allergic rhinitis due to pollen: Secondary | ICD-10-CM | POA: Diagnosis not present

## 2020-11-18 DIAGNOSIS — J301 Allergic rhinitis due to pollen: Secondary | ICD-10-CM | POA: Diagnosis not present

## 2020-11-18 DIAGNOSIS — J3089 Other allergic rhinitis: Secondary | ICD-10-CM | POA: Diagnosis not present

## 2020-11-25 DIAGNOSIS — J301 Allergic rhinitis due to pollen: Secondary | ICD-10-CM | POA: Diagnosis not present

## 2020-11-25 DIAGNOSIS — J3089 Other allergic rhinitis: Secondary | ICD-10-CM | POA: Diagnosis not present

## 2020-11-30 ENCOUNTER — Encounter: Payer: Self-pay | Admitting: Obstetrics & Gynecology

## 2020-11-30 ENCOUNTER — Other Ambulatory Visit: Payer: Self-pay

## 2020-11-30 ENCOUNTER — Ambulatory Visit (INDEPENDENT_AMBULATORY_CARE_PROVIDER_SITE_OTHER): Payer: Medicare Other | Admitting: Obstetrics & Gynecology

## 2020-11-30 VITALS — BP 122/84 | HR 72 | Resp 18 | Ht 64.0 in | Wt 112.4 lb

## 2020-11-30 DIAGNOSIS — Z01419 Encounter for gynecological examination (general) (routine) without abnormal findings: Secondary | ICD-10-CM

## 2020-11-30 DIAGNOSIS — Z780 Asymptomatic menopausal state: Secondary | ICD-10-CM

## 2020-11-30 DIAGNOSIS — M8589 Other specified disorders of bone density and structure, multiple sites: Secondary | ICD-10-CM

## 2020-11-30 NOTE — Progress Notes (Signed)
[name] [name] 1935/09/16 [id number]   History:    85 y.o. G1P1L1 Widowed   RP:  Established patient presenting for annual gyn exam   HPI: Postmenopausal, well on no hormone replacement therapy.  No postmenopausal bleeding.  No pelvic pain.  Abstinent.  Urine and bowel movements normal.  Breasts normal.  Body mass index 19.29. Walking regularly, will return to the gym.  Active at church and [employer], occupational at [employer].  Health labs with family physician.    Past medical history,surgical history, family history and social history were all reviewed and documented in the EPIC chart.  Gynecologic History No LMP recorded. Patient is postmenopausal.  Obstetric History OB History  Gravida Para Term Preterm AB Living  '1 1 1   '$ 0 1  SAB IAB Ectopic Multiple Live Births               # Outcome Date GA Lbr Len/2nd Weight Sex Delivery Anes PTL Lv  1 Term              ROS: A ROS was performed and pertinent positives and negatives are included in the history.  GENERAL: No fevers or chills. HEENT: No change in vision, no earache, sore throat or sinus congestion. NECK: No pain or stiffness. CARDIOVASCULAR: No chest pain or pressure. No palpitations. PULMONARY: No shortness of breath, cough or wheeze. GASTROINTESTINAL: No abdominal pain, nausea, vomiting or diarrhea, melena or bright red blood per rectum. GENITOURINARY: No urinary frequency, urgency, hesitancy or dysuria. MUSCULOSKELETAL: No joint or muscle pain, no back pain, no recent trauma. DERMATOLOGIC: No rash, no itching, no lesions. ENDOCRINE: No polyuria, polydipsia, no heat or cold intolerance. No recent change in weight. HEMATOLOGICAL: No anemia or easy bruising or bleeding. NEUROLOGIC: No headache, seizures, numbness, tingling or weakness. PSYCHIATRIC: No depression, no loss of interest in normal activity or change in sleep pattern.     Exam:   BP 122/84 (BP Location: Left Arm)   Pulse 72   Resp 18   Ht '5\' 4"'$  (1.626 m)   Wt 112 lb 6.4 oz  (51 kg)   BMI 19.29 kg/m   Body mass index is 19.29 kg/m.  General appearance : Well developed well nourished female. No acute distress HEENT: Eyes: no retinal hemorrhage or exudates,  Neck supple, trachea midline, no carotid bruits, no thyroidmegaly Lungs: Clear to auscultation, no rhonchi or wheezes, or rib retractions  Heart: Regular rate and rhythm, no murmurs or gallops Breast:Examined in sitting and supine position were symmetrical in appearance, no palpable masses or tenderness,  no skin retraction, no nipple inversion, no nipple discharge, no skin discoloration, no axillary or supraclavicular lymphadenopathy Abdomen: no palpable masses or tenderness, no rebound or guarding Extremities: no edema or skin discoloration or tenderness  Pelvic: Vulva: Normal             Vagina: No gross lesions or discharge  Cervix: No gross lesions or discharge  Uterus  AV, normal size, shape and consistency, non-tender and mobile  Adnexa  Without masses or tenderness  Anus: Normal   Assessment/Plan:  85 y.o. female for annual exam   1. Well female exam with routine gynecological exam Normal gynecologic exam in menopause.  No indication for a Pap test at this time, Pap test negative in 2020.  Breast exam normal.  Screening mammogram February 2022 was negative.  No longer doing colonoscopies.  Good body mass index at 19.29.  Physically active.  Good nutrition.  Health labs with family  physician.  Followed by cardio.  2. Postmenopause Well on no hormone replacement therapy.  No postmenopausal bleeding.  3. Osteopenia of multiple sites Osteopenia on bone density October 2020.  We will repeat a bone density in October 2022.  Vitamin D supplements, calcium intake of 1.5 g/day total and regular weightbearing physical activities.  Other orders - cholecalciferol (VITAMIN D3) 25 MCG (1000 UNIT) tablet; Take 1,000 Units by mouth daily.   Princess Bruins MD, 3:01 PM 11/30/2020

## 2020-12-02 DIAGNOSIS — J301 Allergic rhinitis due to pollen: Secondary | ICD-10-CM | POA: Diagnosis not present

## 2020-12-02 DIAGNOSIS — J3089 Other allergic rhinitis: Secondary | ICD-10-CM | POA: Diagnosis not present

## 2020-12-07 ENCOUNTER — Telehealth: Payer: Self-pay | Admitting: Pharmacist

## 2020-12-07 NOTE — Progress Notes (Signed)
Chronic Care Management Pharmacy Assistant   Name: ESVEIDY BON  MRN: [id number] DOB: 07/09/1935  Reason for Encounter: Disease State - Hypertension  Recent office visits:  11/03/20 Sharlet Salina (PCP) - Yearly Exam. D/C Zyrtec. Return 1 yr.  Recent consult visits:  11/30/20 Lavoie (GYN) - Routine exam. Decrease Vit D to 1,000. Return 1 yr.  07/23/20 Hochrein (Cardiology) - Palpitations. EKG done. F/u 1 yr.   04/08/20 Harold Hedge (Allergy & Immuno) - Allergic rhinitis due to pollen.  Start Multiple Immunotherapy Injections.  Hospital visits:  None in previous 6 months  Medications: Outpatient Encounter Medications as of 12/07/2020  Medication Sig   ALPRAZolam (XANAX) 0.5 MG tablet TAKE 1-2 TABLETS BY MOUTH 3 TIMES DAILY AS NEEDED FOR ANXIETY. THIS IS 30 DAY SUPPLY.   aspirin 81 MG tablet Take 81 mg by mouth daily.   azelastine (ASTELIN) 0.1 % nasal spray 1-2 puffs in each nostril   BIOTIN 5000 PO Take by mouth 2 (two) times daily.    calcium carbonate (OS-CAL) 600 MG TABS tablet Take 600 mg by mouth daily.    cholecalciferol (VITAMIN D3) 25 MCG (1000 UNIT) tablet Take 1,000 Units by mouth daily.   diltiazem (CARDIZEM CD) 120 MG 24 hr capsule TAKE ONE CAPSULE BY MOUTH DAILY   EPINEPHrine (EPI-PEN) 0.3 mg/0.3 mL DEVI Inject 0.3 mg into the muscle as needed.   fexofenadine (ALLEGRA) 60 MG tablet Take 60 mg by mouth daily.   fish oil-omega-3 fatty acids 1000 MG capsule Take 1 g by mouth daily.   fluticasone (FLONASE) 50 MCG/ACT nasal spray Place 2 sprays into both nostrils daily as needed for allergies or rhinitis.   folic acid (FOLVITE) Q000111Q MCG tablet Take 800 mcg by mouth daily.   ketoconazole (NIZORAL) 2 % shampoo Apply topically.   Multiple Vitamins-Minerals (QC OCUHE[employer] VISION SUPPORT 2 PO) Take 1 tablet by mouth daily.   nitroGLYCERIN (NITROSTAT) 0.4 MG SL tablet Place 1 tablet (0.4 mg total) under the tongue every 5 (five) minutes as needed. May repeat 3 times   Pyridoxine HCl  (VITAMIN B6 PO) Take by mouth.   traZODone (DESYREL) 100 MG tablet Take 1 tablet (100 mg total) by mouth at bedtime.   TURMERIC PO Take 1,500 mg by mouth daily.   UNABLE TO FIND Allergy shots weekly   No facility-administered encounter medications on file as of 12/07/2020.   Reviewed chart prior to disease state call. Spoke with patient regarding BP  Recent Office Vitals: BP Readings from Last 3 Encounters:  11/30/20 122/84  11/03/20 124/88  07/23/20 (!) 141/67   Pulse Readings from Last 3 Encounters:  11/30/20 72  11/03/20 91  07/23/20 77    Wt Readings from Last 3 Encounters:  11/30/20 112 lb 6.4 oz (51 kg)  11/03/20 108 lb (49 kg)  07/23/20 117 lb 3.2 oz (53.2 kg)     Kidney Function Lab Results  Component Value Date/Time   CREATININE 0.81 11/03/2020 09:08 AM   CREATININE 0.71 10/21/2019 10:17 AM   GFR 67.11 11/03/2020 09:08 AM   GFRNONAA 106.38 09/28/2009 03:47 PM   GFRAA 106 06/16/2008 09:05 AM    BMP Latest Ref Rng & Units 11/03/2020 10/21/2019 10/14/2018  Glucose 70 - 99 mg/dL 97 100(H) 75  BUN 6 - 23 mg/dL '11 10 7  '$ Creatinine 0.40 - 1.20 mg/dL 0.81 0.71 0.68  Sodium 135 - 145 mEq/L 137 133(L) 135  Potassium 3.5 - 5.1 mEq/L 4.1 4.2 4.4  Chloride 96 - 112  mEq/L 97 97 97  CO2 19 - 32 mEq/L '29 29 25  '$ Calcium 8.4 - 10.5 mg/dL 10.5 10.1 9.4    Current antihypertensive regimen:  Diltiazem 120 mg daily  How often are you checking your Blood Pressure? Patient states when she last saw Mendel Ryder, [place] she told her that her BP was doing good and she don't have to check it everyday.  Current home BP readings:   None  What recent interventions/DTPs have been made by any provider to improve Blood Pressure control since last CPP Visit:   None lisited  Any recent hospitalizations or ED visits since last visit with CPP? No  What diet changes have been made to improve Blood Pressure Control?  Patient states no recent diet changes.  What exercise is being done to improve  your Blood Pressure Control?  Patient states she go to the gym and exercise 2 - 3 days a week.  Adherence Review: Is the patient currently on ACE/ARB medication? No Does the patient have >5 day gap between last estimated fill dates? No   Star Rating Drugs: None listed   Orinda Kenner, RMA Clinical Pharmacists Assistant [phone]  Time Spent: 67

## 2020-12-09 DIAGNOSIS — J3089 Other allergic rhinitis: Secondary | ICD-10-CM | POA: Diagnosis not present

## 2020-12-09 DIAGNOSIS — J301 Allergic rhinitis due to pollen: Secondary | ICD-10-CM | POA: Diagnosis not present

## 2020-12-16 DIAGNOSIS — J301 Allergic rhinitis due to pollen: Secondary | ICD-10-CM | POA: Diagnosis not present

## 2020-12-16 DIAGNOSIS — J3089 Other allergic rhinitis: Secondary | ICD-10-CM | POA: Diagnosis not present

## 2020-12-23 DIAGNOSIS — J3089 Other allergic rhinitis: Secondary | ICD-10-CM | POA: Diagnosis not present

## 2020-12-23 DIAGNOSIS — J301 Allergic rhinitis due to pollen: Secondary | ICD-10-CM | POA: Diagnosis not present

## 2020-12-27 DIAGNOSIS — L821 Other seborrheic keratosis: Secondary | ICD-10-CM | POA: Diagnosis not present

## 2020-12-27 DIAGNOSIS — L814 Other melanin hyperpigmentation: Secondary | ICD-10-CM | POA: Diagnosis not present

## 2020-12-27 DIAGNOSIS — L603 Nail dystrophy: Secondary | ICD-10-CM | POA: Diagnosis not present

## 2020-12-27 DIAGNOSIS — L570 Actinic keratosis: Secondary | ICD-10-CM | POA: Diagnosis not present

## 2020-12-30 DIAGNOSIS — J301 Allergic rhinitis due to pollen: Secondary | ICD-10-CM | POA: Diagnosis not present

## 2020-12-30 DIAGNOSIS — J3089 Other allergic rhinitis: Secondary | ICD-10-CM | POA: Diagnosis not present

## 2021-01-05 ENCOUNTER — Telehealth: Payer: Medicare Other

## 2021-01-06 DIAGNOSIS — J301 Allergic rhinitis due to pollen: Secondary | ICD-10-CM | POA: Diagnosis not present

## 2021-01-06 DIAGNOSIS — J3089 Other allergic rhinitis: Secondary | ICD-10-CM | POA: Diagnosis not present

## 2021-01-12 DIAGNOSIS — H903 Sensorineural hearing loss, bilateral: Secondary | ICD-10-CM | POA: Diagnosis not present

## 2021-01-13 DIAGNOSIS — Z23 Encounter for immunization: Secondary | ICD-10-CM | POA: Diagnosis not present

## 2021-01-20 DIAGNOSIS — J3089 Other allergic rhinitis: Secondary | ICD-10-CM | POA: Diagnosis not present

## 2021-01-20 DIAGNOSIS — J301 Allergic rhinitis due to pollen: Secondary | ICD-10-CM | POA: Diagnosis not present

## 2021-01-27 ENCOUNTER — Telehealth: Payer: Medicare Other

## 2021-01-27 DIAGNOSIS — J3089 Other allergic rhinitis: Secondary | ICD-10-CM | POA: Diagnosis not present

## 2021-01-27 DIAGNOSIS — J301 Allergic rhinitis due to pollen: Secondary | ICD-10-CM | POA: Diagnosis not present

## 2021-02-03 DIAGNOSIS — J3089 Other allergic rhinitis: Secondary | ICD-10-CM | POA: Diagnosis not present

## 2021-02-03 DIAGNOSIS — J301 Allergic rhinitis due to pollen: Secondary | ICD-10-CM | POA: Diagnosis not present

## 2021-02-10 DIAGNOSIS — J301 Allergic rhinitis due to pollen: Secondary | ICD-10-CM | POA: Diagnosis not present

## 2021-02-10 DIAGNOSIS — J3089 Other allergic rhinitis: Secondary | ICD-10-CM | POA: Diagnosis not present

## 2021-02-17 DIAGNOSIS — J301 Allergic rhinitis due to pollen: Secondary | ICD-10-CM | POA: Diagnosis not present

## 2021-02-17 DIAGNOSIS — J3081 Allergic rhinitis due to animal (cat) (dog) hair and dander: Secondary | ICD-10-CM | POA: Diagnosis not present

## 2021-02-17 DIAGNOSIS — J3089 Other allergic rhinitis: Secondary | ICD-10-CM | POA: Diagnosis not present

## 2021-02-18 DIAGNOSIS — H26491 Other secondary cataract, right eye: Secondary | ICD-10-CM | POA: Diagnosis not present

## 2021-02-18 DIAGNOSIS — Z961 Presence of intraocular lens: Secondary | ICD-10-CM | POA: Diagnosis not present

## 2021-02-18 DIAGNOSIS — H04123 Dry eye syndrome of bilateral lacrimal glands: Secondary | ICD-10-CM | POA: Diagnosis not present

## 2021-02-24 DIAGNOSIS — J301 Allergic rhinitis due to pollen: Secondary | ICD-10-CM | POA: Diagnosis not present

## 2021-02-24 DIAGNOSIS — J3089 Other allergic rhinitis: Secondary | ICD-10-CM | POA: Diagnosis not present

## 2021-03-02 ENCOUNTER — Ambulatory Visit (INDEPENDENT_AMBULATORY_CARE_PROVIDER_SITE_OTHER)
Admission: RE | Admit: 2021-03-02 | Discharge: 2021-03-02 | Disposition: A | Discharge status: Home / Self Care | Payer: Medicare Other | Source: Ambulatory Visit | Attending: Internal Medicine | Admitting: Internal Medicine

## 2021-03-02 ENCOUNTER — Other Ambulatory Visit: Payer: Self-pay

## 2021-03-02 DIAGNOSIS — E2839 Other primary ovarian failure: Secondary | ICD-10-CM

## 2021-03-02 DIAGNOSIS — M1812 Unilateral primary osteoarthritis of first carpometacarpal joint, left hand: Secondary | ICD-10-CM | POA: Diagnosis not present

## 2021-03-02 DIAGNOSIS — M25541 Pain in joints of right hand: Secondary | ICD-10-CM | POA: Diagnosis not present

## 2021-03-02 DIAGNOSIS — M25542 Pain in joints of left hand: Secondary | ICD-10-CM | POA: Diagnosis not present

## 2021-03-03 ENCOUNTER — Telehealth: Payer: Self-pay | Admitting: Internal Medicine

## 2021-03-03 DIAGNOSIS — J3089 Other allergic rhinitis: Secondary | ICD-10-CM | POA: Diagnosis not present

## 2021-03-03 DIAGNOSIS — J301 Allergic rhinitis due to pollen: Secondary | ICD-10-CM | POA: Diagnosis not present

## 2021-03-03 NOTE — Telephone Encounter (Signed)
No recent labs to discuss

## 2021-03-03 NOTE — Telephone Encounter (Signed)
Patient requesting a call to discuss lab results  Patient states if call can not be returned before closing today, call tomorrow morning before 11am

## 2021-03-07 NOTE — Telephone Encounter (Signed)
Patient states she had a bone density test done on 03-02-2021  Patient states she has called multiple times and it is important that she receives a call back today

## 2021-03-07 NOTE — Telephone Encounter (Signed)
Called pt. LVM with results   If patient calls back here results are below.   Osteopenia check again in 3-5 years.

## 2021-03-09 DIAGNOSIS — J3089 Other allergic rhinitis: Secondary | ICD-10-CM | POA: Diagnosis not present

## 2021-03-09 DIAGNOSIS — J301 Allergic rhinitis due to pollen: Secondary | ICD-10-CM | POA: Diagnosis not present

## 2021-03-10 DIAGNOSIS — J3089 Other allergic rhinitis: Secondary | ICD-10-CM | POA: Diagnosis not present

## 2021-03-10 DIAGNOSIS — J301 Allergic rhinitis due to pollen: Secondary | ICD-10-CM | POA: Diagnosis not present

## 2021-03-17 DIAGNOSIS — J3089 Other allergic rhinitis: Secondary | ICD-10-CM | POA: Diagnosis not present

## 2021-03-17 DIAGNOSIS — J301 Allergic rhinitis due to pollen: Secondary | ICD-10-CM | POA: Diagnosis not present

## 2021-03-31 DIAGNOSIS — J301 Allergic rhinitis due to pollen: Secondary | ICD-10-CM | POA: Diagnosis not present

## 2021-03-31 DIAGNOSIS — J3089 Other allergic rhinitis: Secondary | ICD-10-CM | POA: Diagnosis not present

## 2021-04-07 DIAGNOSIS — J3089 Other allergic rhinitis: Secondary | ICD-10-CM | POA: Diagnosis not present

## 2021-04-07 DIAGNOSIS — J301 Allergic rhinitis due to pollen: Secondary | ICD-10-CM | POA: Diagnosis not present

## 2021-04-14 DIAGNOSIS — J301 Allergic rhinitis due to pollen: Secondary | ICD-10-CM | POA: Diagnosis not present

## 2021-04-14 DIAGNOSIS — J3089 Other allergic rhinitis: Secondary | ICD-10-CM | POA: Diagnosis not present

## 2021-05-05 DIAGNOSIS — J0110 Acute frontal sinusitis, unspecified: Secondary | ICD-10-CM | POA: Diagnosis not present

## 2021-05-05 DIAGNOSIS — J3081 Allergic rhinitis due to animal (cat) (dog) hair and dander: Secondary | ICD-10-CM | POA: Diagnosis not present

## 2021-05-05 DIAGNOSIS — J3089 Other allergic rhinitis: Secondary | ICD-10-CM | POA: Diagnosis not present

## 2021-05-05 DIAGNOSIS — H1045 Other chronic allergic conjunctivitis: Secondary | ICD-10-CM | POA: Diagnosis not present

## 2021-05-05 DIAGNOSIS — J301 Allergic rhinitis due to pollen: Secondary | ICD-10-CM | POA: Diagnosis not present

## 2021-05-09 ENCOUNTER — Other Ambulatory Visit: Payer: Self-pay | Admitting: Cardiology

## 2021-05-12 ENCOUNTER — Telehealth: Payer: Self-pay

## 2021-05-12 NOTE — Progress Notes (Signed)
Chronic Care Management Pharmacy Assistant   Name: [name] [name]  MRN: [id number] DOB: Nov 29, 1935   Reason for Encounter: Disease State   Conditions to be addressed/monitored: HTN   Recent office visits:  None ID  Recent consult visits:  11/30/20 Princess Bruins, MD-Obstetrics and Gynecology (Annual Exam) No orders placed, med changes: cholecalciferol 1000 mg  Hospital visits:  None in previous 6 months  Medications: Outpatient Encounter Medications as of 05/12/2021  Medication Sig   ALPRAZolam (XANAX) 0.5 MG tablet TAKE 1-2 TABLETS BY MOUTH 3 TIMES DAILY AS NEEDED FOR ANXIETY. THIS IS 30 DAY SUPPLY.   aspirin 81 MG tablet Take 81 mg by mouth daily.   azelastine (ASTELIN) 0.1 % nasal spray 1-2 puffs in each nostril   BIOTIN 5000 PO Take by mouth 2 (two) times daily.    calcium carbonate (OS-CAL) 600 MG TABS tablet Take 600 mg by mouth daily.    cholecalciferol (VITAMIN D3) 25 MCG (1000 UNIT) tablet Take 1,000 Units by mouth daily.   diltiazem (CARDIZEM CD) 120 MG 24 hr capsule TAKE ONE CAPSULE BY MOUTH DAILY   EPINEPHrine (EPI-PEN) 0.3 mg/0.3 mL DEVI Inject 0.3 mg into the muscle as needed.   fexofenadine (ALLEGRA) 60 MG tablet Take 60 mg by mouth daily.   fish oil-omega-3 fatty acids 1000 MG capsule Take 1 g by mouth daily.   fluticasone (FLONASE) 50 MCG/ACT nasal spray Place 2 sprays into both nostrils daily as needed for allergies or rhinitis.   folic acid (FOLVITE) 664 MCG tablet Take 800 mcg by mouth daily.   ketoconazole (NIZORAL) 2 % shampoo Apply topically.   Multiple Vitamins-Minerals (QC OCUHE[employer] VISION SUPPORT 2 PO) Take 1 tablet by mouth daily.   nitroGLYCERIN (NITROSTAT) 0.4 MG SL tablet Place 1 tablet (0.4 mg total) under the tongue every 5 (five) minutes as needed. May repeat 3 times   Pyridoxine HCl (VITAMIN B6 PO) Take by mouth.   traZODone (DESYREL) 100 MG tablet Take 1 tablet (100 mg total) by mouth at bedtime.   TURMERIC PO Take 1,500 mg by mouth  daily.   UNABLE TO FIND Allergy shots weekly   No facility-administered encounter medications on file as of 05/12/2021.    Recent Office Vitals: BP Readings from Last 3 Encounters:  11/30/20 122/84  11/03/20 124/88  07/23/20 (!) 141/67   Pulse Readings from Last 3 Encounters:  11/30/20 72  11/03/20 91  07/23/20 77    Wt Readings from Last 3 Encounters:  11/30/20 112 lb 6.4 oz (51 kg)  11/03/20 108 lb (49 kg)  07/23/20 117 lb 3.2 oz (53.2 kg)     Kidney Function Lab Results  Component Value Date/Time   CREATININE 0.81 11/03/2020 09:08 AM   CREATININE 0.71 10/21/2019 10:17 AM   GFR 67.11 11/03/2020 09:08 AM   GFRNONAA 106.38 09/28/2009 03:47 PM   GFRAA 106 06/16/2008 09:05 AM    BMP Latest Ref Rng & Units 11/03/2020 10/21/2019 10/14/2018  Glucose 70 - 99 mg/dL 97 100(H) 75  BUN 6 - 23 mg/dL 11 10 7   Creatinine 0.40 - 1.20 mg/dL 0.81 0.71 0.68  Sodium 135 - 145 mEq/L 137 133(L) 135  Potassium 3.5 - 5.1 mEq/L 4.1 4.2 4.4  Chloride 96 - 112 mEq/L 97 97 97  CO2 19 - 32 mEq/L 29 29 25   Calcium 8.4 - 10.5 mg/dL 10.5 10.1 9.4   Reviewed chart prior to disease state call. Spoke with patient regarding BP  Current antihypertensive regimen:  Diltiazem  120 mg daily  How often are you checking your Blood Pressure? 1-2x per week  Current home BP readings: Patient does not have any readings but states that bp is normal. She is not having any light headedness or dizziness at this time  What recent interventions/DTPs have been made by any provider to improve Blood Pressure control since last CPP Visit: None noted  Any recent hospitalizations or ED visits since last visit with CPP? No  What diet changes have been made to improve Blood Pressure Control?  Patient states she has not made any new changes to her diet  What exercise is being done to improve your Blood Pressure Control?  Patient states that she usually goes to the gym at least twice a week but has not had the energy to  exercise because she has been under the weather.  Adherence Review: Is the patient currently on ACE/ARB medication? No Does the patient have >5 day gap between last estimated fill dates? No    Care Gaps: Colonoscopy-NA Diabetic Foot Exam-NA Mammogram-NA Ophthalmology-NA Dexa Scan - NA Annual Well Visit - 11/03/20 Micro albumin-NA Hemoglobin A1c- NA  Star Rating Drugs: None ID  Ethelene Hal Clinical Pharmacist Assistant [phone]

## 2021-05-19 DIAGNOSIS — J301 Allergic rhinitis due to pollen: Secondary | ICD-10-CM | POA: Diagnosis not present

## 2021-05-19 DIAGNOSIS — J3089 Other allergic rhinitis: Secondary | ICD-10-CM | POA: Diagnosis not present

## 2021-05-23 ENCOUNTER — Other Ambulatory Visit: Payer: Self-pay | Admitting: Internal Medicine

## 2021-05-26 DIAGNOSIS — J3089 Other allergic rhinitis: Secondary | ICD-10-CM | POA: Diagnosis not present

## 2021-05-26 DIAGNOSIS — J301 Allergic rhinitis due to pollen: Secondary | ICD-10-CM | POA: Diagnosis not present

## 2021-06-02 DIAGNOSIS — J3089 Other allergic rhinitis: Secondary | ICD-10-CM | POA: Diagnosis not present

## 2021-06-02 DIAGNOSIS — J301 Allergic rhinitis due to pollen: Secondary | ICD-10-CM | POA: Diagnosis not present

## 2021-06-09 DIAGNOSIS — J301 Allergic rhinitis due to pollen: Secondary | ICD-10-CM | POA: Diagnosis not present

## 2021-06-09 DIAGNOSIS — J3089 Other allergic rhinitis: Secondary | ICD-10-CM | POA: Diagnosis not present

## 2021-06-15 DIAGNOSIS — J3081 Allergic rhinitis due to animal (cat) (dog) hair and dander: Secondary | ICD-10-CM | POA: Diagnosis not present

## 2021-06-15 DIAGNOSIS — H1045 Other chronic allergic conjunctivitis: Secondary | ICD-10-CM | POA: Diagnosis not present

## 2021-06-15 DIAGNOSIS — J301 Allergic rhinitis due to pollen: Secondary | ICD-10-CM | POA: Diagnosis not present

## 2021-06-15 DIAGNOSIS — J3089 Other allergic rhinitis: Secondary | ICD-10-CM | POA: Diagnosis not present

## 2021-06-21 DIAGNOSIS — J3089 Other allergic rhinitis: Secondary | ICD-10-CM | POA: Diagnosis not present

## 2021-06-21 DIAGNOSIS — J3081 Allergic rhinitis due to animal (cat) (dog) hair and dander: Secondary | ICD-10-CM | POA: Diagnosis not present

## 2021-06-21 DIAGNOSIS — J301 Allergic rhinitis due to pollen: Secondary | ICD-10-CM | POA: Diagnosis not present

## 2021-06-28 DIAGNOSIS — Z1231 Encounter for screening mammogram for malignant neoplasm of breast: Secondary | ICD-10-CM | POA: Diagnosis not present

## 2021-06-30 DIAGNOSIS — J3089 Other allergic rhinitis: Secondary | ICD-10-CM | POA: Diagnosis not present

## 2021-06-30 DIAGNOSIS — J301 Allergic rhinitis due to pollen: Secondary | ICD-10-CM | POA: Diagnosis not present

## 2021-07-04 ENCOUNTER — Encounter: Payer: Self-pay | Admitting: Obstetrics & Gynecology

## 2021-07-11 DIAGNOSIS — D2271 Melanocytic nevi of right lower limb, including hip: Secondary | ICD-10-CM | POA: Diagnosis not present

## 2021-07-11 DIAGNOSIS — L814 Other melanin hyperpigmentation: Secondary | ICD-10-CM | POA: Diagnosis not present

## 2021-07-11 DIAGNOSIS — D2272 Melanocytic nevi of left lower limb, including hip: Secondary | ICD-10-CM | POA: Diagnosis not present

## 2021-07-11 DIAGNOSIS — L821 Other seborrheic keratosis: Secondary | ICD-10-CM | POA: Diagnosis not present

## 2021-07-11 DIAGNOSIS — D225 Melanocytic nevi of trunk: Secondary | ICD-10-CM | POA: Diagnosis not present

## 2021-07-11 DIAGNOSIS — D2372 Other benign neoplasm of skin of left lower limb, including hip: Secondary | ICD-10-CM | POA: Diagnosis not present

## 2021-07-11 DIAGNOSIS — L570 Actinic keratosis: Secondary | ICD-10-CM | POA: Diagnosis not present

## 2021-07-11 DIAGNOSIS — D692 Other nonthrombocytopenic purpura: Secondary | ICD-10-CM | POA: Diagnosis not present

## 2021-07-14 DIAGNOSIS — H10413 Chronic giant papillary conjunctivitis, bilateral: Secondary | ICD-10-CM | POA: Diagnosis not present

## 2021-07-14 DIAGNOSIS — H5203 Hypermetropia, bilateral: Secondary | ICD-10-CM | POA: Diagnosis not present

## 2021-07-14 DIAGNOSIS — H04123 Dry eye syndrome of bilateral lacrimal glands: Secondary | ICD-10-CM | POA: Diagnosis not present

## 2021-07-14 DIAGNOSIS — Z961 Presence of intraocular lens: Secondary | ICD-10-CM | POA: Diagnosis not present

## 2021-07-18 DIAGNOSIS — J301 Allergic rhinitis due to pollen: Secondary | ICD-10-CM | POA: Diagnosis not present

## 2021-07-18 DIAGNOSIS — J3089 Other allergic rhinitis: Secondary | ICD-10-CM | POA: Diagnosis not present

## 2021-07-28 DIAGNOSIS — J3089 Other allergic rhinitis: Secondary | ICD-10-CM | POA: Diagnosis not present

## 2021-07-28 DIAGNOSIS — J301 Allergic rhinitis due to pollen: Secondary | ICD-10-CM | POA: Diagnosis not present

## 2021-07-28 DIAGNOSIS — J3081 Allergic rhinitis due to animal (cat) (dog) hair and dander: Secondary | ICD-10-CM | POA: Diagnosis not present

## 2021-07-29 ENCOUNTER — Encounter (HOSPITAL_BASED_OUTPATIENT_CLINIC_OR_DEPARTMENT_OTHER): Payer: Self-pay | Admitting: Emergency Medicine

## 2021-07-29 ENCOUNTER — Emergency Department (HOSPITAL_BASED_OUTPATIENT_CLINIC_OR_DEPARTMENT_OTHER)
Admission: EM | Admit: 2021-07-29 | Discharge: 2021-07-29 | Disposition: A | Discharge status: Home / Self Care | Payer: Medicare Other | Attending: Emergency Medicine | Admitting: Emergency Medicine

## 2021-07-29 ENCOUNTER — Other Ambulatory Visit: Payer: Self-pay

## 2021-07-29 ENCOUNTER — Emergency Department (HOSPITAL_BASED_OUTPATIENT_CLINIC_OR_DEPARTMENT_OTHER): Payer: Medicare Other

## 2021-07-29 DIAGNOSIS — M79662 Pain in left lower leg: Secondary | ICD-10-CM | POA: Diagnosis not present

## 2021-07-29 DIAGNOSIS — Z7982 Long term (current) use of aspirin: Secondary | ICD-10-CM | POA: Insufficient documentation

## 2021-07-29 DIAGNOSIS — M7121 Synovial cyst of popliteal space [Baker], right knee: Secondary | ICD-10-CM | POA: Insufficient documentation

## 2021-07-29 DIAGNOSIS — M79604 Pain in right leg: Secondary | ICD-10-CM | POA: Diagnosis not present

## 2021-07-29 DIAGNOSIS — M25561 Pain in right knee: Secondary | ICD-10-CM | POA: Diagnosis not present

## 2021-07-29 DIAGNOSIS — M79605 Pain in left leg: Secondary | ICD-10-CM | POA: Diagnosis not present

## 2021-07-29 DIAGNOSIS — M79661 Pain in right lower leg: Secondary | ICD-10-CM | POA: Diagnosis not present

## 2021-07-29 DIAGNOSIS — M7989 Other specified soft tissue disorders: Secondary | ICD-10-CM | POA: Diagnosis not present

## 2021-07-29 NOTE — ED Triage Notes (Signed)
Rt knee pain x 3 weeks  was seen at [facility] orthoped and was sent for further test ?

## 2021-07-29 NOTE — ED Provider Notes (Signed)
?[facility] DE[facility] ?Provider Note ? ? ?CSN: [id number] ?Arrival date & time: 07/29/21  1621 ? ?  ? ?History ? ?Chief Complaint  ?Patient presents with  ? Knee Pain  ? ? ?[name] [name] is a 86 y.o. female. ? ?Patient presents with complaint of right knee discomfort.  Ongoing for 3 weeks.  States it is not severe pain but just discomfort and swelling.  She went to [facility] today and sent to the ER after x-rays were done there showing arthritic changes and no fractures.  Otherwise patient denies any recent fall or trauma.  No fever no cough no vomiting or diarrhea. ? ? ?  ? ?Home Medications ?Prior to Admission medications   ?Medication Sig Start Date End Date Taking? Authorizing Provider  ?ALPRAZolam (XANAX) 0.5 MG tablet TAKE 1-2 TABLETS BY MOUTH 3 TIMES DAILY AS NEEDED FOR ANXIETY. 05/23/21   Hoyt Koch, MD  ?aspirin 81 MG tablet Take 81 mg by mouth daily.    [provider]  ?azelastine (ASTELIN) 0.1 % nasal spray 1-2 puffs in each nostril    [provider]  ?BIOTIN 5000 PO Take by mouth 2 (two) times daily.     [provider]  ?calcium carbonate (OS-CAL) 600 MG TABS tablet Take 600 mg by mouth daily.     [provider]  ?cholecalciferol (VITAMIN D3) 25 MCG (1000 UNIT) tablet Take 1,000 Units by mouth daily.    [provider]  ?diltiazem (CARDIZEM CD) 120 MG 24 hr capsule TAKE ONE CAPSULE BY MOUTH DAILY 05/09/21   Minus Breeding, MD  ?EPINEPHrine (EPI-PEN) 0.3 mg/0.3 mL DEVI Inject 0.3 mg into the muscle as needed.    [provider]  ?fexofenadine (ALLEGRA) 60 MG tablet Take 60 mg by mouth daily.    [provider]  ?fish oil-omega-3 fatty acids 1000 MG capsule Take 1 g by mouth daily.    [provider]  ?fluticasone (FLONASE) 50 MCG/ACT nasal spray Place 2 sprays into both nostrils daily as needed for allergies or rhinitis.    [provider]  ?folic acid (FOLVITE) 469 MCG tablet Take 800 mcg  by mouth daily.    [provider]  ?ketoconazole (NIZORAL) 2 % shampoo Apply topically. 05/06/20   [provider]  ?Multiple Vitamins-Minerals (QC [employer] VISION SUPPORT 2 PO) Take 1 tablet by mouth daily.    [provider]  ?nitroGLYCERIN (NITROSTAT) 0.4 MG SL tablet Place 1 tablet (0.4 mg total) under the tongue every 5 (five) minutes as needed. May repeat 3 times 01/28/19   Minus Breeding, MD  ?Pyridoxine HCl (VITAMIN B6 PO) Take by mouth.    [provider]  ?traZODone (DESYREL) 100 MG tablet Take 1 tablet (100 mg total) by mouth at bedtime. 11/03/20   Hoyt Koch, MD  ?TURMERIC PO Take 1,500 mg by mouth daily.    [provider]  ?UNABLE TO FIND Allergy shots weekly    [provider]  ?   ? ?Allergies    ?Codeine, Erythromycin, Other, and Cefdinir   ? ?Review of Systems   ?Review of Systems  ?Constitutional:  Negative for fever.  ?HENT:  Negative for ear pain.   ?Eyes:  Negative for pain.  ?Respiratory:  Negative for cough.   ?Cardiovascular:  Negative for chest pain.  ?Gastrointestinal:  Negative for abdominal pain.  ?Genitourinary:  Negative for flank pain.  ?Musculoskeletal:  Negative for back pain.  ?Skin:  Negative for rash.  ?Neurological:  Negative for headaches.  ? ?Physical Exam ?Updated Vital Signs ?BP (!) 178/83 (BP Location: Right Arm)   Pulse 91   Temp 97.6 ?F (36.4 ?C) (Oral)   Resp 16   SpO2 99%  ?Physical Exam ?Constitutional:   ?   General: She is not in acute distress. ?   Appearance: Normal appearance.  ?HENT:  ?   Head: Normocephalic.  ?   Nose: Nose normal.  ?Eyes:  ?   Extraocular Movements: Extraocular movements intact.  ?Cardiovascular:  ?   Rate and Rhythm: Normal rate.  ?Pulmonary:  ?   Effort: Pulmonary effort is normal.  ?Musculoskeletal:     ?   General: Swelling present. Normal range of motion.  ?   Cervical back: Normal range of motion.  ?   Comments: Bilateral lower extremities are neurovascularly intact  compartments are soft.  Right lower extremity appears more swollen from the knee down to the ankle.  No abnormal warmth or cellulitis noted.  Range of motion of the knee is normal bilaterally.  No ankle tenderness or swelling noted.  ?Neurological:  ?   General: No focal deficit present.  ?   Mental Status: She is alert. Mental status is at baseline.  ? ? ?ED Results / Procedures / Treatments   ?Labs ?(all labs ordered are listed, but only abnormal results are displayed) ?Labs Reviewed - No data to display ? ?EKG ?None ? ?Radiology ?US Venous Img Lower Unilateral Right ? ?Result Date: 07/29/2021 ?CLINICAL DATA:  Right knee pain and swelling. EXAM: RIGHT LOWER EXTREMITY VENOUS DOPPLER ULTRASOUND TECHNIQUE: Gray-scale sonography with graded compression, as well as color Doppler and duplex ultrasound were performed to evaluate the lower extremity deep venous systems from the level of the common femoral vein and including the common femoral, femoral, profunda femoral, popliteal and calf veins including the posterior tibial, peroneal and gastrocnemius veins when visible. The superficial great saphenous vein was also interrogated. Spectral Doppler was utilized to evaluate flow at rest and with distal augmentation maneuvers in the common femoral, femoral and popliteal veins. COMPARISON:  None. FINDINGS: Contralateral Common Femoral Vein: Respiratory phasicity is normal and symmetric with the symptomatic side. No evidence of thrombus. Normal compressibility. Common Femoral Vein: No evidence of thrombus. Normal compressibility, respiratory phasicity and response to augmentation. Saphenofemoral Junction: No evidence of thrombus. Normal compressibility and flow on color Doppler imaging. Profunda Femoral Vein: No evidence of thrombus. Normal compressibility and flow on color Doppler imaging. Femoral Vein: No evidence of thrombus. Normal compressibility, respiratory phasicity and response to augmentation. Popliteal Vein: No  evidence of thrombus. Normal compressibility, respiratory phasicity and response to augmentation. Calf Veins: No evidence of thrombus. Normal compressibility and flow on color Doppler imaging. Superficial Great Saphenous Vein: No evidence of thrombus. Normal compressibility. Other Findings: Fluid collection in the medial popliteal fossa that measures 2.5 x 1.4 x 2.7 cm. IMPRESSION: 1.  Negative for deep venous thrombosis in right lower extremity. 2. Fluid collection in the medial right popliteal fossa. This is compatible with a Baker's cyst. Electronically Signed   By: Markus Daft M.D.   On: 07/29/2021 17:34   ? ?Procedures ?Procedures  ? ? ?Medications Ordered in ED ?Medications - No data to display ? ?ED Course/ Medical Decision Making/ A&P ?  ?                        ?Medical Decision Making ? ?Chart review shows visit with her allergist yesterday. ? ?Cardiac  monitoring shows sinus rhythm. ? ?Imaging includes ultrasound the right lower extremity shows no evidence of DVT but fluid collection in the popliteal fossa consistent with Baker's cyst.  Patient advised continued follow-up with her orthopedist this week.  Advised return for worsening symptoms fall or fevers. ? ? ? ? ? ? ? ?Final Clinical Impression(s) / ED Diagnoses ?Final diagnoses:  ?Synovial cyst of right popliteal space  ? ? ?Rx / DC Orders ?ED Discharge Orders   ? ? None  ? ?  ? ? ?  ?Luna Fuse, MD ?07/29/21 1746 ? ?

## 2021-07-29 NOTE — Discharge Instructions (Addendum)
There is no DVT in your ultrasound. ? ?Findings were consistent with likely Baker's cyst.  Take Tylenol as needed for any pain.  Follow-up with your orthopedist this week.  Return if you have fevers chest pain or any additional concerns. ?

## 2021-08-04 DIAGNOSIS — J301 Allergic rhinitis due to pollen: Secondary | ICD-10-CM | POA: Diagnosis not present

## 2021-08-04 DIAGNOSIS — J3089 Other allergic rhinitis: Secondary | ICD-10-CM | POA: Diagnosis not present

## 2021-08-11 DIAGNOSIS — J3089 Other allergic rhinitis: Secondary | ICD-10-CM | POA: Diagnosis not present

## 2021-08-11 DIAGNOSIS — J301 Allergic rhinitis due to pollen: Secondary | ICD-10-CM | POA: Diagnosis not present

## 2021-08-14 NOTE — Progress Notes (Signed)
?  ?Cardiology Office Note ? ? ?Date:  08/15/2021  ? ?ID:  [name] [name], DOB [birth year], MRN [id number] ? ?PCP:  Hoyt Koch, MD  ?Cardiologist:   None ? ? ?Chief Complaint  ?Patient presents with  ? Palpitations  ? ? ?  ?History of Present Illness: ?[name] [name] is a 86 y.o. female who presents for followup of hypertension and palpitations.   She had dyspnea in 2016.  However, POET (Plain Old Exercise Treadmill) was negative for evidence of ischemia.  She returns for follow up.  ? ?Since I last saw her she has done okay.  She has had problems with her foot recently on her knee and so she has not been exercising as much.  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  She feels some palpitations when she gets up and goes to the bathroom at night but beyond that she is not having any tachypalpitations.  She is not having any new shortness of breath, PND or orthopnea.  She had no new palpitations, presyncope or syncope ? ? ? ?Past Medical History:  ?Diagnosis Date  ? Anxiety   ? Coronary artery spasm ([place])   ? Non obstructive CAD  ? Diverticulosis of colon   ? Fibromyalgia   ? Hx of colonic polyps   ? Lumbar back pain   ? MVP (mitral valve prolapse)   ? No antibiotics required for procedures  ? Osteoporosis   ? Palpitations   ? Pneumonia   ? Shingles   ? ? ?Past Surgical History:  ?Procedure Laterality Date  ? CATARACT EXTRACTION, BILATERAL    ? CATARACT EXTRACTION, BILATERAL  05/2018  ? CHOLECYSTECTOMY  03/2003  ? LUMBAR LAMINECTOMY    ? TONSILLECTOMY    ? as a child  ? ? ? ?Current Outpatient Medications  ?Medication Sig Dispense Refill  ? ALPRAZolam (XANAX) 0.5 MG tablet TAKE 1-2 TABLETS BY MOUTH 3 TIMES DAILY AS NEEDED FOR ANXIETY. 70 tablet 5  ? aspirin 81 MG tablet Take 81 mg by mouth daily.    ? azelastine (ASTELIN) 0.1 % nasal spray 1-2 puffs in each nostril    ? BIOTIN 5000 PO Take by mouth 2 (two) times daily.     ? calcium carbonate (OS-CAL) 600 MG TABS tablet Take 600 mg by mouth daily.      ? cholecalciferol (VITAMIN D3) 25 MCG (1000 UNIT) tablet Take 1,000 Units by mouth daily.    ? diltiazem (CARDIZEM CD) 120 MG 24 hr capsule TAKE ONE CAPSULE BY MOUTH DAILY 90 capsule 2  ? EPINEPHrine (EPI-PEN) 0.3 mg/0.3 mL DEVI Inject 0.3 mg into the muscle as needed.    ? fexofenadine (ALLEGRA) 60 MG tablet Take 60 mg by mouth daily.    ? fish oil-omega-3 fatty acids 1000 MG capsule Take 1 g by mouth daily.    ? fluticasone (FLONASE) 50 MCG/ACT nasal spray Place 2 sprays into both nostrils daily as needed for allergies or rhinitis.    ? folic acid (FOLVITE) 244 MCG tablet Take 800 mcg by mouth daily.    ? ketoconazole (NIZORAL) 2 % shampoo Apply topically.    ? Multiple Vitamins-Minerals (QC [employer] VISION SUPPORT 2 PO) Take 1 tablet by mouth daily.    ? Pyridoxine HCl (VITAMIN B6 PO) Take by mouth.    ? traZODone (DESYREL) 100 MG tablet Take 1 tablet (100 mg total) by mouth at bedtime. 90 tablet 3  ? TURMERIC PO Take 1,500 mg by mouth daily.    ?  UNABLE TO FIND Allergy shots weekly    ? nitroGLYCERIN (NITROSTAT) 0.4 MG SL tablet Place 1 tablet (0.4 mg total) under the tongue every 5 (five) minutes as needed. May repeat 3 times 25 tablet 5  ? ?No current facility-administered medications for this visit.  ? ? ?Allergies:   Codeine, Erythromycin, Other, and Cefdinir  ? ? ?ROS:  Please see the history of present illness.   Otherwise, review of systems are positive for none.   All other systems are reviewed and negative.  ? ? ?PHYSICAL EXAM: ?VS:  BP 116/62   Pulse 75   Ht '5\' 3"'$  (1.6 m)   Wt 116 lb (52.6 kg)   SpO2 99%   BMI 20.55 kg/m?  , BMI Body mass index is 20.55 kg/m?. ?GENERAL:  Well appearing ?NECK:  No jugular venous distention, waveform within normal limits, carotid upstroke brisk and symmetric, no bruits, no thyromegaly ?LUNGS:  Clear to auscultation bilaterally ?CHEST:  Unremarkable ?HEART:  PMI not displaced or sustained,S1 and S2 within normal limits, no S3, no S4, no clicks, no rubs, no  murmurs ?ABD:  Flat, positive bowel sounds normal in frequency in pitch, no bruits, no rebound, no guarding, no midline pulsatile mass, no hepatomegaly, no splenomegaly ?EXT:  2 plus pulses throughout, no edema, no cyanosis no clubbing ? ? ?EKG:  EKG is  ordered today. ?The ekg ordered today demonstrates sinus rhythm, rate 75, axis left axis deviation, left ventricular hypertrophy by voltage criteria, poor anterior R wave progression, nonspecific T wave changes, no change from previous. ? ? ?Recent Labs: ?11/03/2020: ALT 24; BUN 11; Creatinine, Ser 0.81; Hemoglobin 15.8; Platelets 263.0; Potassium 4.1; Sodium 137; TSH 2.33  ? ? ?Lipid Panel ?   ?Component Value Date/Time  ? CHOL 193 11/03/2020 0908  ? TRIG 91.0 11/03/2020 0908  ? HDL 89.90 11/03/2020 0908  ? CHOLHDL 2 11/03/2020 0908  ? VLDL 18.2 11/03/2020 0908  ? [place] 85 11/03/2020 0908  ? LDLDIRECT 79.4 06/29/2010 0803  ? ?  ? ?Wt Readings from Last 3 Encounters:  ?08/15/21 116 lb (52.6 kg)  ?11/30/20 112 lb 6.4 oz (51 kg)  ?11/03/20 108 lb (49 kg)  ?  ? ? ?Other studies Reviewed: ?Additional studies/ records that were reviewed today include: Labs ?Review of the above records demonstrates: See elsewhere ? ? ?ASSESSMENT AND PLAN: ? ?PALPITATIONS:   She is not noticing these other than a few at night.  No change in therapy. ?  ?HTN: Her blood pressure is mildly elevated but typically well controlled at home and she will keep an eye on this.  ? ?ABNORMAL EKG:   She has had no new symptoms since having a stress test that was negative in 2016.  No further work-up.  ? ? ? ?Current medicines are reviewed at length with the patient today.  The patient does not have concerns regarding medicines. ? ?The following changes have been made: None ? ?Labs/ tests ordered today include: None ? ?Orders Placed This Encounter  ?Procedures  ? EKG 12-Lead  ? ? ? ?Disposition:   FU with me in 12 months.    ? ? ?Signed, ?Minus Breeding, MD  ?08/15/2021 11:34 AM    ?[place] ? ? ?

## 2021-08-15 ENCOUNTER — Ambulatory Visit (INDEPENDENT_AMBULATORY_CARE_PROVIDER_SITE_OTHER): Payer: Medicare Other | Admitting: Cardiology

## 2021-08-15 ENCOUNTER — Encounter: Payer: Self-pay | Admitting: Cardiology

## 2021-08-15 VITALS — BP 116/62 | HR 75 | Ht 63.0 in | Wt 116.0 lb

## 2021-08-15 DIAGNOSIS — R002 Palpitations: Secondary | ICD-10-CM

## 2021-08-15 DIAGNOSIS — I10 Essential (primary) hypertension: Secondary | ICD-10-CM | POA: Diagnosis not present

## 2021-08-15 DIAGNOSIS — R9431 Abnormal electrocardiogram [ECG] [EKG]: Secondary | ICD-10-CM

## 2021-08-15 DIAGNOSIS — I341 Nonrheumatic mitral (valve) prolapse: Secondary | ICD-10-CM

## 2021-08-15 MED ORDER — NITROGLYCERIN 0.4 MG SL SUBL: 0.4000 mg | SUBLINGUAL_TABLET | SUBLINGUAL | 5 refills | Status: DC | PRN

## 2021-08-15 NOTE — Patient Instructions (Signed)

## 2021-08-16 DIAGNOSIS — M25561 Pain in right knee: Secondary | ICD-10-CM | POA: Diagnosis not present

## 2021-08-25 DIAGNOSIS — J3089 Other allergic rhinitis: Secondary | ICD-10-CM | POA: Diagnosis not present

## 2021-08-25 DIAGNOSIS — J301 Allergic rhinitis due to pollen: Secondary | ICD-10-CM | POA: Diagnosis not present

## 2021-08-25 DIAGNOSIS — J3081 Allergic rhinitis due to animal (cat) (dog) hair and dander: Secondary | ICD-10-CM | POA: Diagnosis not present

## 2021-09-01 DIAGNOSIS — J3089 Other allergic rhinitis: Secondary | ICD-10-CM | POA: Diagnosis not present

## 2021-09-01 DIAGNOSIS — J301 Allergic rhinitis due to pollen: Secondary | ICD-10-CM | POA: Diagnosis not present

## 2021-09-08 DIAGNOSIS — J301 Allergic rhinitis due to pollen: Secondary | ICD-10-CM | POA: Diagnosis not present

## 2021-09-08 DIAGNOSIS — J3089 Other allergic rhinitis: Secondary | ICD-10-CM | POA: Diagnosis not present

## 2021-09-15 DIAGNOSIS — J3089 Other allergic rhinitis: Secondary | ICD-10-CM | POA: Diagnosis not present

## 2021-09-15 DIAGNOSIS — J301 Allergic rhinitis due to pollen: Secondary | ICD-10-CM | POA: Diagnosis not present

## 2021-09-16 DIAGNOSIS — L309 Dermatitis, unspecified: Secondary | ICD-10-CM | POA: Diagnosis not present

## 2021-09-23 DIAGNOSIS — J3089 Other allergic rhinitis: Secondary | ICD-10-CM | POA: Diagnosis not present

## 2021-09-23 DIAGNOSIS — J3081 Allergic rhinitis due to animal (cat) (dog) hair and dander: Secondary | ICD-10-CM | POA: Diagnosis not present

## 2021-09-23 DIAGNOSIS — J301 Allergic rhinitis due to pollen: Secondary | ICD-10-CM | POA: Diagnosis not present

## 2021-09-23 DIAGNOSIS — H1045 Other chronic allergic conjunctivitis: Secondary | ICD-10-CM | POA: Diagnosis not present

## 2021-09-27 ENCOUNTER — Ambulatory Visit
Admission: RE | Admit: 2021-09-27 | Discharge: 2021-09-27 | Disposition: A | Discharge status: Home / Self Care | Payer: BLUE CROSS/BLUE SHIELD | Source: Ambulatory Visit | Attending: Allergy and Immunology | Admitting: Allergy and Immunology

## 2021-09-27 ENCOUNTER — Other Ambulatory Visit: Payer: Self-pay | Admitting: Allergy and Immunology

## 2021-09-27 DIAGNOSIS — R059 Cough, unspecified: Secondary | ICD-10-CM | POA: Diagnosis not present

## 2021-09-27 DIAGNOSIS — R058 Other specified cough: Secondary | ICD-10-CM

## 2021-10-03 DIAGNOSIS — R519 Headache, unspecified: Secondary | ICD-10-CM | POA: Diagnosis not present

## 2021-10-03 DIAGNOSIS — J342 Deviated nasal septum: Secondary | ICD-10-CM | POA: Diagnosis not present

## 2021-10-03 DIAGNOSIS — J343 Hypertrophy of nasal turbinates: Secondary | ICD-10-CM | POA: Diagnosis not present

## 2021-10-03 DIAGNOSIS — J310 Chronic rhinitis: Secondary | ICD-10-CM | POA: Diagnosis not present

## 2021-10-03 DIAGNOSIS — G8929 Other chronic pain: Secondary | ICD-10-CM | POA: Diagnosis not present

## 2021-10-06 DIAGNOSIS — J301 Allergic rhinitis due to pollen: Secondary | ICD-10-CM | POA: Diagnosis not present

## 2021-10-06 DIAGNOSIS — J3081 Allergic rhinitis due to animal (cat) (dog) hair and dander: Secondary | ICD-10-CM | POA: Diagnosis not present

## 2021-10-06 DIAGNOSIS — J3089 Other allergic rhinitis: Secondary | ICD-10-CM | POA: Diagnosis not present

## 2021-10-10 DIAGNOSIS — H5711 Ocular pain, right eye: Secondary | ICD-10-CM | POA: Diagnosis not present

## 2021-10-10 DIAGNOSIS — S0501XA Injury of conjunctiva and corneal abrasion without foreign body, right eye, initial encounter: Secondary | ICD-10-CM | POA: Diagnosis not present

## 2021-10-13 DIAGNOSIS — J301 Allergic rhinitis due to pollen: Secondary | ICD-10-CM | POA: Diagnosis not present

## 2021-10-13 DIAGNOSIS — J3089 Other allergic rhinitis: Secondary | ICD-10-CM | POA: Diagnosis not present

## 2021-10-13 DIAGNOSIS — J3081 Allergic rhinitis due to animal (cat) (dog) hair and dander: Secondary | ICD-10-CM | POA: Diagnosis not present

## 2021-10-14 DIAGNOSIS — S0501XD Injury of conjunctiva and corneal abrasion without foreign body, right eye, subsequent encounter: Secondary | ICD-10-CM | POA: Diagnosis not present

## 2021-10-20 DIAGNOSIS — J3081 Allergic rhinitis due to animal (cat) (dog) hair and dander: Secondary | ICD-10-CM | POA: Diagnosis not present

## 2021-10-20 DIAGNOSIS — J301 Allergic rhinitis due to pollen: Secondary | ICD-10-CM | POA: Diagnosis not present

## 2021-10-20 DIAGNOSIS — J3089 Other allergic rhinitis: Secondary | ICD-10-CM | POA: Diagnosis not present

## 2021-10-27 DIAGNOSIS — J301 Allergic rhinitis due to pollen: Secondary | ICD-10-CM | POA: Diagnosis not present

## 2021-10-27 DIAGNOSIS — J3081 Allergic rhinitis due to animal (cat) (dog) hair and dander: Secondary | ICD-10-CM | POA: Diagnosis not present

## 2021-10-27 DIAGNOSIS — J3089 Other allergic rhinitis: Secondary | ICD-10-CM | POA: Diagnosis not present

## 2021-11-03 DIAGNOSIS — J3089 Other allergic rhinitis: Secondary | ICD-10-CM | POA: Diagnosis not present

## 2021-11-03 DIAGNOSIS — J3081 Allergic rhinitis due to animal (cat) (dog) hair and dander: Secondary | ICD-10-CM | POA: Diagnosis not present

## 2021-11-03 DIAGNOSIS — J301 Allergic rhinitis due to pollen: Secondary | ICD-10-CM | POA: Diagnosis not present

## 2021-11-04 ENCOUNTER — Ambulatory Visit: Payer: Medicare Other | Admitting: Internal Medicine

## 2021-11-07 ENCOUNTER — Encounter: Payer: Self-pay | Admitting: Internal Medicine

## 2021-11-07 ENCOUNTER — Ambulatory Visit (INDEPENDENT_AMBULATORY_CARE_PROVIDER_SITE_OTHER): Payer: Medicare Other | Admitting: Internal Medicine

## 2021-11-07 VITALS — BP 122/80 | HR 89 | Resp 18 | Ht 63.0 in | Wt 113.2 lb

## 2021-11-07 DIAGNOSIS — J301 Allergic rhinitis due to pollen: Secondary | ICD-10-CM

## 2021-11-07 DIAGNOSIS — F5101 Primary insomnia: Secondary | ICD-10-CM | POA: Diagnosis not present

## 2021-11-07 DIAGNOSIS — I10 Essential (primary) hypertension: Secondary | ICD-10-CM | POA: Diagnosis not present

## 2021-11-07 DIAGNOSIS — F411 Generalized anxiety disorder: Secondary | ICD-10-CM

## 2021-11-07 DIAGNOSIS — E673 Hypervitaminosis D: Secondary | ICD-10-CM

## 2021-11-07 DIAGNOSIS — Z0000 Encounter for general adult medical examination without abnormal findings: Secondary | ICD-10-CM

## 2021-11-07 DIAGNOSIS — H900 Conductive hearing loss, bilateral: Secondary | ICD-10-CM

## 2021-11-07 DIAGNOSIS — I2583 Coronary atherosclerosis due to lipid rich plaque: Secondary | ICD-10-CM

## 2021-11-07 DIAGNOSIS — M8589 Other specified disorders of bone density and structure, multiple sites: Secondary | ICD-10-CM | POA: Diagnosis not present

## 2021-11-07 DIAGNOSIS — I2510 Atherosclerotic heart disease of native coronary artery without angina pectoris: Secondary | ICD-10-CM | POA: Diagnosis not present

## 2021-11-07 LAB — COMPREHENSIVE METABOLIC PANEL
ALT: 24 U/L (ref 0–35)
AST: 26 U/L (ref 0–37)
Albumin: 4.7 g/dL (ref 3.5–5.2)
Alkaline Phosphatase: 102 U/L (ref 39–117)
BUN: 9 mg/dL (ref 6–23)
CO2: 26 mEq/L (ref 19–32)
Calcium: 9.7 mg/dL (ref 8.4–10.5)
Chloride: 99 mEq/L (ref 96–112)
Creatinine, Ser: 0.68 mg/dL (ref 0.40–1.20)
GFR: 79.95 mL/min (ref >60.00)
Glucose, Bld: 86 mg/dL (ref 70–99)
Potassium: 4.3 mEq/L (ref 3.5–5.1)
Sodium: 134 mEq/L — ABNORMAL LOW (ref 135–145)
Total Bilirubin: 0.5 mg/dL (ref 0.2–1.2)
Total Protein: 7.4 g/dL (ref 6.0–8.3)

## 2021-11-07 LAB — LIPID PANEL
Cholesterol: 190 mg/dL (ref 0–200)
HDL: 88.4 mg/dL (ref >39.00)
LDL Cholesterol: 82 mg/dL (ref 0–99)
NonHDL: 101.75
Total CHOL/HDL Ratio: 2
Triglycerides: 101 mg/dL (ref 0.0–149.0)
VLDL: 20.2 mg/dL (ref 0.0–40.0)

## 2021-11-07 LAB — CBC
HCT: 44.2 % (ref 36.0–46.0)
Hemoglobin: 15 g/dL (ref 12.0–15.0)
MCHC: 33.9 g/dL (ref 30.0–36.0)
MCV: 102.4 fl — ABNORMAL HIGH (ref 78.0–100.0)
Platelets: 244 10*3/uL (ref 150.0–400.0)
RBC: 4.32 Mil/uL (ref 3.87–5.11)
RDW: 12.8 % (ref 11.5–15.5)
WBC: 5.5 10*3/uL (ref 4.0–10.5)

## 2021-11-07 LAB — VITAMIN D 25 HYDROXY (VIT D DEFICIENCY, FRACTURES): VITD: 86.77 ng/mL (ref 30.00–100.00)

## 2021-11-07 LAB — VITAMIN B12: Vitamin B-12: 676 pg/mL (ref 211–911)

## 2021-11-07 MED ORDER — ALPRAZOLAM 0.5 MG PO TABS: 0.5000 mg | ORAL_TABLET | Freq: Three times a day (TID) | ORAL | 5 refills | Status: DC | PRN

## 2021-11-07 MED ORDER — TRAZODONE HCL 100 MG PO TABS: 100.0000 mg | ORAL_TABLET | Freq: Every day | ORAL | 3 refills | Status: DC

## 2021-11-07 NOTE — Assessment & Plan Note (Signed)
Using hearing aids and working well.

## 2021-11-07 NOTE — Assessment & Plan Note (Signed)
Uses trazodone 100 mg daily and working well. Refilled at same dosing for 1 year supply.

## 2021-11-07 NOTE — Assessment & Plan Note (Signed)
Reviewed [place] database and appropriate. Still using #70 xanax per month with trazodone 100 mg qhs for sleep and adequate control. Refilled for 6 months today of xanax and year supply of trazodone. Will plan to continue same dose of both.

## 2021-11-07 NOTE — Progress Notes (Signed)
Subjective:   Patient ID: [name] [name], female    DOB: July 26, 1935, 86 y.o.   MRN: [id number]  HPI Here for medicare wellness, no new complaints needs follow up. Please see A/P for status and treatment of chronic medical problems.   Diet: heart healthy Physical activity: gym several times a week Depression/mood screen: negative Hearing: intact to whispered voice w/ bilateral aids Visual acuity: grossly normal with lens, performs annual eye exam  ADLs: capable Fall risk: none Home safety: good Cognitive evaluation: intact to orientation, naming, recall and repetition EOL planning: adv directives discussed  [place] Visit from 11/07/2021 in [place] at [place] Office Visit from 11/07/2021 in [place] at [facility]  PHQ-9 Total Score 0         10/14/2018    8:06 AM 10/21/2019    9:48 AM 11/03/2020    8:40 AM 07/29/2021    4:26 PM 11/07/2021    8:08 AM  [place] in the past year? 0 0 0  1  Was there an injury with Fall?   0  0  Fall Risk Category Calculator   0  1  Fall Risk Category   Low  Low  Patient Fall Risk Level    Low fall risk     I have personally reviewed and have noted 1. The patient's medical and social history - reviewed today no changes 2. Their use of alcohol, tobacco or illicit drugs 3. Their current medications and supplements 4. The patient's functional ability including ADL's, fall risks, home safety risks and hearing or visual impairment. 5. Diet and physical activities 6. Evidence for depression or mood disorders 7. Care team reviewed and updated 8.  The patient is not on an opioid pain medication.  Patient Care Team: Hoyt Koch, MD as PCP - General (Internal Medicine) Charlton Haws, [facility] as Pharmacist (Pharmacist) Past Medical History:  Diagnosis Date   Anxiety    Coronary artery spasm [facility])    Non obstructive CAD   Diverticulosis of  colon    Fibromyalgia    Hx of colonic polyps    Lumbar back pain    MVP (mitral valve prolapse)    No antibiotics required for procedures   Osteoporosis    Palpitations    Pneumonia    Shingles    Past Surgical History:  Procedure Laterality Date   CATARACT EXTRACTION, BILATERAL     CATARACT EXTRACTION, BILATERAL  05/2018   CHOLECYSTECTOMY  03/2003   LUMBAR LAMINECTOMY     TONSILLECTOMY     as a child   Family History  Problem Relation Age of Onset   Rheum arthritis Mother    Stroke Mother    Hypertension Mother    Heart failure Father    Heart disease Father    Asthma Maternal Grandfather    Allergies Maternal Grandfather    COPD Maternal Aunt         2 aunts   Hypertension Brother    Review of Systems  Constitutional: Negative.   HENT: Negative.    Eyes: Negative.   Respiratory:  Negative for cough, chest tightness and shortness of breath.   Cardiovascular:  Negative for chest pain, palpitations and leg swelling.  Gastrointestinal:  Negative for abdominal distention, abdominal pain, constipation, diarrhea, nausea and vomiting.  Musculoskeletal: Negative.   Skin: Negative.   Neurological: Negative.   Psychiatric/Behavioral:  Negative.      Objective:  Physical Exam Constitutional:      Appearance: She is well-developed.  HENT:     Head: Normocephalic and atraumatic.  Cardiovascular:     Rate and Rhythm: Normal rate and regular rhythm.  Pulmonary:     Effort: Pulmonary effort is normal. No respiratory distress.     Breath sounds: Normal breath sounds. No wheezing or rales.  Abdominal:     General: Bowel sounds are normal. There is no distension.     Palpations: Abdomen is soft.     Tenderness: There is no abdominal tenderness. There is no rebound.  Musculoskeletal:     Cervical back: Normal range of motion.  Skin:    General: Skin is warm and dry.  Neurological:     Mental Status: She is alert and oriented to person, place, and time.      Coordination: Coordination normal.     Vitals:   11/07/21 0805  BP: 122/80  Pulse: 89  Resp: 18  SpO2: 98%  Weight: 113 lb 3.2 oz (51.3 kg)  Height: '5\' 3"'$  (1.6 m)    Assessment & Plan:

## 2021-11-07 NOTE — Patient Instructions (Addendum)
In October I would get a covid-19 booster and a flu shot.  We will check the labs today and have refilled the medicines.

## 2021-11-07 NOTE — Assessment & Plan Note (Signed)
Flu shot yearly. Covid-19 counseled. Pneumonia complete. Shingrix complete. Tetanus due 2030. Colonoscopy aged out. Mammogram aged out, pap smear aged out and dexa complete. Counseled about sun safety and mole surveillance. Counseled about the dangers of distracted driving. Given 10 year screening recommendations.

## 2021-11-07 NOTE — Assessment & Plan Note (Signed)
No anginal symptoms. Taking aspirin 81 mg daily and fish oil otc. Continue. Checking lipid panel.

## 2021-11-07 NOTE — Assessment & Plan Note (Signed)
Recent bone density improved and can consider repeat in 3-5 year if desired. Checking vitamin D as this was high last year and we reduced otc.

## 2021-11-07 NOTE — Assessment & Plan Note (Signed)
Year round and recent switch to xyzal which has helped some.

## 2021-11-07 NOTE — Assessment & Plan Note (Signed)
BP at goal on diltiazem 120 mg daily. Checking CMP and CBC and adjust as needed. Continue.

## 2021-11-09 DIAGNOSIS — J301 Allergic rhinitis due to pollen: Secondary | ICD-10-CM | POA: Diagnosis not present

## 2021-11-09 DIAGNOSIS — J3089 Other allergic rhinitis: Secondary | ICD-10-CM | POA: Diagnosis not present

## 2021-11-14 ENCOUNTER — Ambulatory Visit: Payer: Medicare Other

## 2021-11-14 NOTE — Addendum Note (Signed)
Addended by: Glenna Durand E on: 11/14/2021 11:54 AM   Modules accepted: Orders, Level of Service

## 2021-11-14 NOTE — Patient Instructions (Signed)
[name] [name] , Thank you for taking time to come for your Medicare Wellness Visit. I appreciate your ongoing commitment to your health goals. Please review the following plan we discussed and let me know if I can assist you in the future.   Screening recommendations/referrals: Colonoscopy: not required Mammogram: completed 07/04/2021, due 07/06/2022 Bone Density: completed 03/02/2021 Recommended yearly ophthalmology/optometry visit for glaucoma screening and checkup Recommended yearly dental visit for hygiene and checkup  Vaccinations: Influenza vaccine: due 11/29/2021 Pneumococcal vaccine: completed 04/17/2014 Tdap vaccine: completed 05/24/2018, due 05/24/2028 Shingles vaccine: completed   Covid-19: 06/15/2020, 02/28/2020, 06/13/2019, 05/23/2019  Advanced directives: Please bring a copy of your POA (Power of Attorney) and/or Living Will to your next appointment.   Conditions/risks identified: none  Next appointment: Follow up in one year for your annual wellness visit    Preventive Care 65 Years and Older, Female Preventive care refers to lifestyle choices and visits with your health care provider that can promote health and wellness. What does preventive care include? A yearly physical exam. This is also called an annual well check. Dental exams once or twice a year. Routine eye exams. Ask your health care provider how often you should have your eyes checked. Personal lifestyle choices, including: Daily care of your teeth and gums. Regular physical activity. Eating a healthy diet. Avoiding tobacco and drug use. Limiting alcohol use. Practicing safe sex. Taking low-dose aspirin every day. Taking vitamin and mineral supplements as recommended by your health care provider. What happens during an annual well check? The services and screenings done by your health care provider during your annual well check will depend on your age, overall health, lifestyle risk factors, and family history of  disease. Counseling  Your health care provider may ask you questions about your: Alcohol use. Tobacco use. Drug use. Emotional well-being. Home and relationship well-being. Sexual activity. Eating habits. History of falls. Memory and ability to understand (cognition). Work and work Statistician. Reproductive health. Screening  You may have the following tests or measurements: Height, weight, and BMI. Blood pressure. Lipid and cholesterol levels. These may be checked every 5 years, or more frequently if you are over 14 years old. Skin check. Lung cancer screening. You may have this screening every year starting at age 18 if you have a 30-pack-year history of smoking and currently smoke or have quit within the past 15 years. Fecal occult blood test (FOBT) of the stool. You may have this test every year starting at age 20. Flexible sigmoidoscopy or colonoscopy. You may have a sigmoidoscopy every 5 years or a colonoscopy every 10 years starting at age 49. Hepatitis C blood test. Hepatitis B blood test. Sexually transmitted disease (STD) testing. Diabetes screening. This is done by checking your blood sugar (glucose) after you have not eaten for a while (fasting). You may have this done every 1-3 years. Bone density scan. This is done to screen for osteoporosis. You may have this done starting at age 44. Mammogram. This may be done every 1-2 years. Talk to your health care provider about how often you should have regular mammograms. Talk with your health care provider about your test results, treatment options, and if necessary, the need for more tests. Vaccines  Your health care provider may recommend certain vaccines, such as: Influenza vaccine. This is recommended every year. Tetanus, diphtheria, and acellular pertussis (Tdap, Td) vaccine. You may need a Td booster every 10 years. Zoster vaccine. You may need this after age 81. Pneumococcal 13-valent conjugate (PCV13)  vaccine. One  dose is recommended after age 34. Pneumococcal polysaccharide (PPSV23) vaccine. One dose is recommended after age 43. Talk to your health care provider about which screenings and vaccines you need and how often you need them. This information is not intended to replace advice given to you by your health care provider. Make sure you discuss any questions you have with your health care provider. Document Released: 05/14/2015 Document Revised: 01/05/2016 Document Reviewed: 02/16/2015 Elsevier Interactive Patient Education  2017 [place] Prevention in the Home Falls can cause injuries. They can happen to people of all ages. There are many things you can do to make your home safe and to help prevent falls. What can I do on the outside of my home? Regularly fix the edges of walkways and driveways and fix any cracks. Remove anything that might make you trip as you walk through a door, such as a raised step or threshold. Trim any bushes or trees on the path to your home. Use bright outdoor lighting. Clear any walking paths of anything that might make someone trip, such as rocks or tools. Regularly check to see if handrails are loose or broken. Make sure that both sides of any steps have handrails. Any raised decks and porches should have guardrails on the edges. Have any leaves, snow, or ice cleared regularly. Use sand or salt on walking paths during winter. Clean up any spills in your garage right away. This includes oil or grease spills. What can I do in the bathroom? Use night lights. Install grab bars by the toilet and in the tub and shower. Do not use towel bars as grab bars. Use non-skid mats or decals in the tub or shower. If you need to sit down in the shower, use a plastic, non-slip stool. Keep the floor dry. Clean up any water that spills on the floor as soon as it happens. Remove soap buildup in the tub or shower regularly. Attach bath mats securely with double-sided  non-slip rug tape. Do not have throw rugs and other things on the floor that can make you trip. What can I do in the bedroom? Use night lights. Make sure that you have a light by your bed that is easy to reach. Do not use any sheets or blankets that are too big for your bed. They should not hang down onto the floor. Have a firm chair that has side arms. You can use this for support while you get dressed. Do not have throw rugs and other things on the floor that can make you trip. What can I do in the kitchen? Clean up any spills right away. Avoid walking on wet floors. Keep items that you use a lot in easy-to-reach places. If you need to reach something above you, use a strong step stool that has a grab bar. Keep electrical cords out of the way. Do not use floor polish or wax that makes floors slippery. If you must use wax, use non-skid floor wax. Do not have throw rugs and other things on the floor that can make you trip. What can I do with my stairs? Do not leave any items on the stairs. Make sure that there are handrails on both sides of the stairs and use them. Fix handrails that are broken or loose. Make sure that handrails are as long as the stairways. Check any carpeting to make sure that it is firmly attached to the stairs. Fix any carpet that is loose  or worn. Avoid having throw rugs at the top or bottom of the stairs. If you do have throw rugs, attach them to the floor with carpet tape. Make sure that you have a light switch at the top of the stairs and the bottom of the stairs. If you do not have them, ask someone to add them for you. What else can I do to help prevent falls? Wear shoes that: Do not have high heels. Have rubber bottoms. Are comfortable and fit you well. Are closed at the toe. Do not wear sandals. If you use a stepladder: Make sure that it is fully opened. Do not climb a closed stepladder. Make sure that both sides of the stepladder are locked into place. Ask  someone to hold it for you, if possible. Clearly mark and make sure that you can see: Any grab bars or handrails. First and last steps. Where the edge of each step is. Use tools that help you move around (mobility aids) if they are needed. These include: Canes. Walkers. Scooters. Crutches. Turn on the lights when you go into a dark area. Replace any light bulbs as soon as they burn out. Set up your furniture so you have a clear path. Avoid moving your furniture around. If any of your floors are uneven, fix them. If there are any pets around you, be aware of where they are. Review your medicines with your doctor. Some medicines can make you feel dizzy. This can increase your chance of falling. Ask your doctor what other things that you can do to help prevent falls. This information is not intended to replace advice given to you by your health care provider. Make sure you discuss any questions you have with your health care provider. Document Released: 02/11/2009 Document Revised: 09/23/2015 Document Reviewed: 05/22/2014 Elsevier Interactive Patient Education  2017 [employer].

## 2021-11-14 NOTE — Progress Notes (Signed)
This encounter was created in error - please disregard.

## 2021-11-14 NOTE — Progress Notes (Cosign Needed)
I connected with [name] [name] today by telephone and verified that I am speaking with the correct person using two identifiers. Location patient: home Location provider: work Persons participating in the virtual visit: [name] [name], Glenna Durand LPN.   I discussed the limitations, risks, security and privacy concerns of performing an evaluation and management service by telephone and the availability of in person appointments. I also discussed with the patient that there may be a patient responsible charge related to this service. The patient expressed understanding and verbally consented to this telephonic visit.    Interactive audio and video telecommunications were attempted between this provider and patient, however failed, due to patient having technical difficulties OR patient did not have access to video capability.  We continued and completed visit with audio only.     Vital signs may be patient reported or missing.  Subjective:   [name] [name] is a 86 y.o. female who presents for Medicare Annual (Subsequent) preventive examination.  Review of Systems     Cardiac Risk Factors include: advanced age (>59mn, >>19women);hypertension     Objective:    Today's Vitals   11/14/21 0841  Weight: 113 lb (51.3 kg)  Height: 5' 0.5" (1.537 m)   Body mass index is 21.71 kg/m.     11/14/2021    8:48 AM 02/13/2018   10:52 AM  Advanced Directives  Does Patient Have a Medical Advance Directive? Yes Yes  Type of AParamedicof AKalevaLiving will HMingoLiving will  Copy of [place] Chart? No - copy requested     Current Medications (verified) Outpatient Encounter Medications as of 11/14/2021  Medication Sig   ALPRAZolam (XANAX) 0.5 MG tablet Take 1 tablet (0.5 mg total) by mouth 3 (three) times daily as needed for anxiety.   aspirin 81 MG tablet Take 81 mg by mouth daily.   azelastine (ASTELIN) 0.1 %  nasal spray 1-2 puffs in each nostril   BIOTIN 5000 PO Take by mouth 2 (two) times daily.    calcium carbonate (OS-CAL) 600 MG TABS tablet Take 600 mg by mouth daily.    cholecalciferol (VITAMIN D3) 25 MCG (1000 UNIT) tablet Take 1,000 Units by mouth daily.   Cyanocobalamin 1000 MCG/15ML LIQD Take 15 mLs by mouth daily.   diltiazem (CARDIZEM CD) 120 MG 24 hr capsule TAKE ONE CAPSULE BY MOUTH DAILY   EPINEPHrine (EPI-PEN) 0.3 mg/0.3 mL DEVI Inject 0.3 mg into the muscle as needed.   fish oil-omega-3 fatty acids 1000 MG capsule Take 1 g by mouth daily.   fluticasone (FLONASE) 50 MCG/ACT nasal spray Place 2 sprays into both nostrils daily as needed for allergies or rhinitis.   folic acid (FOLVITE) 8932MCG tablet Take 800 mcg by mouth daily.   ketoconazole (NIZORAL) 2 % shampoo Apply topically.   levocetirizine (XYZAL) 2.5 MG/5ML solution Take 2.5 mg by mouth every evening.   Multiple Vitamins-Minerals (QC [facility] VISION SUPPORT 2 PO) Take 1 tablet by mouth daily.   nitroGLYCERIN (NITROSTAT) 0.4 MG SL tablet Place 1 tablet (0.4 mg total) under the tongue every 5 (five) minutes as needed. May repeat 3 times   traZODone (DESYREL) 100 MG tablet Take 1 tablet (100 mg total) by mouth at bedtime.   TURMERIC PO Take 1,500 mg by mouth daily.   UNABLE TO FIND Allergy shots weekly   No facility-administered encounter medications on file as of 11/14/2021.    Allergies (verified) Codeine, Erythromycin, Other, and Cefdinir  History: Past Medical History:  Diagnosis Date   Anxiety    Coronary artery spasm (HCC)    Non obstructive CAD   Diverticulosis of colon    Fibromyalgia    Hx of colonic polyps    Lumbar back pain    MVP (mitral valve prolapse)    No antibiotics required for procedures   Osteoporosis    Palpitations    Pneumonia    Shingles    Past Surgical History:  Procedure Laterality Date   CATARACT EXTRACTION, BILATERAL     CATARACT EXTRACTION, BILATERAL  05/2018    CHOLECYSTECTOMY  03/2003   LUMBAR LAMINECTOMY     TONSILLECTOMY     as a child   Family History  Problem Relation Age of Onset   Rheum arthritis Mother    Stroke Mother    Hypertension Mother    Heart failure Father    Heart disease Father    Asthma Maternal Grandfather    Allergies Maternal Grandfather    COPD Maternal Aunt         2 aunts   Hypertension Brother    Social History   Socioeconomic History   Marital status: Widowed    Spouse name: Not on file   Number of children: 1   Years of education: Not on file   Highest education level: Not on file  Occupational History   Occupation: Environmental education officer: RETIRED    Comment: [employer]    Comment: taught preschool, Network engineer  Tobacco Use   Smoking status: Former    Packs/day: 0.50    Years: 15.00    Total pack years: 7.50    Types: Cigarettes    Quit date: 05/01/1978    Years since quitting: 43.5   Smokeless tobacco: Never  Vaping Use   Vaping Use: Never used  Substance and Sexual Activity   Alcohol use: Yes    Alcohol/week: 9.0 standard drinks of alcohol    Types: 9 Glasses of wine per week   Drug use: No   Sexual activity: Not Currently    Birth control/protection: Post-menopausal  Other Topics Concern   Not on file  Social History Narrative   Married to Drema Pry   1 daughter with ??FM, neck pain   Social Determinants of Health   Financial Resource Strain: Low Risk  (11/14/2021)   Overall Financial Resource Strain (CARDIA)    Difficulty of Paying Living Expenses: Not hard at all  Food Insecurity: No Food Insecurity (11/14/2021)   Hunger Vital Sign    Worried About Running Out of Food in the Last Year: Never true    Ran Out of Food in the Last Year: Never true  Transportation Needs: No Transportation Needs (11/14/2021)   PRAPARE - Hydrologist (Medical): No    Lack of Transportation (Non-Medical): No  Physical Activity: Insufficiently Active (11/14/2021)    Exercise Vital Sign    Days of Exercise per Week: 2 days    Minutes of Exercise per Session: 60 min  Stress: No Stress Concern Present (11/14/2021)   [place]    Feeling of Stress : Not at all  Social Connections: Not on file    Tobacco Counseling Counseling given: Not Answered   Clinical Intake:  Pre-visit preparation completed: Yes  Pain : No/denies pain     Nutritional Status: BMI of 19-24  Normal Nutritional Risks: None Diabetes: No  How often  do you need to have someone help you when you read instructions, pamphlets, or other written materials from your doctor or pharmacy?: 1 - Never  Diabetic? no  Interpreter Needed?: No  Information entered by :: NAllen LPN   Activities of Daily Living    11/14/2021    8:49 AM  In your present state of health, do you have any difficulty performing the following activities:  Hearing? 0  Comment has hearing aids  Vision? 0  Difficulty concentrating or making decisions? 0  Walking or climbing stairs? 0  Dressing or bathing? 0  Doing errands, shopping? 0  Preparing Food and eating ? N  Using the Toilet? N  In the past six months, have you accidently leaked urine? N  Do you have problems with loss of bowel control? N  Managing your Medications? N  Managing your Finances? N  Housekeeping or managing your Housekeeping? N    Patient Care Team: Hoyt Koch, MD as PCP - General (Internal Medicine) Charlton Haws, [facility] as Pharmacist (Pharmacist)  Indicate any recent Medical Services you may have received from other than [facility] providers in the past year (date may be approximate).     Assessment:   This is a routine wellness examination for [place].  Hearing/Vision screen Vision Screening - Comments:: Regular eye exams, [facility]  Dietary issues and exercise activities discussed: Current Exercise Habits: Home exercise routine, Type of  exercise: walking, Time (Minutes): 60, Frequency (Times/Week): 2, Weekly Exercise (Minutes/Week): 120   Goals Addressed             This Visit's Progress    Patient Stated       11/14/2021, wants to lose 3-4 pounds       Depression Screen    11/14/2021    8:49 AM 11/07/2021    8:08 AM 11/03/2020    8:41 AM 10/21/2019    9:48 AM 10/14/2018    8:06 AM 08/21/2017    9:37 AM 08/11/2015    8:04 AM  PHQ 2/9 Scores  PHQ - 2 Score 0 0 0 0 1 0 0  PHQ- 9 Score  0         Fall Risk    11/14/2021    8:48 AM 11/07/2021    8:08 AM 11/03/2020    8:40 AM 10/21/2019    9:48 AM 10/14/2018    8:06 AM  Fall Risk   Falls in the past year? 1 1 0 0 0  Comment shoe got caught      Number falls in past yr: 0 0 0    Injury with Fall? 0 0 0    Risk for fall due to : Medication side effect      Follow up Falls evaluation completed;Education provided;Falls prevention discussed        FALL RISK PREVENTION PERTAINING TO THE HOME:  Any stairs in or around the home? Yes  If so, are there any without handrails? No  Home free of loose throw rugs in walkways, pet beds, electrical cords, etc? Yes  Adequate lighting in your home to reduce risk of falls? Yes   ASSISTIVE DEVICES UTILIZED TO PREVENT FALLS:  Life alert? No  Use of a cane, walker or w/c? No  Grab bars in the bathroom? Yes  Shower chair or bench in shower? Yes  Elevated toilet seat or a handicapped toilet? Yes   TIMED UP AND GO:  Was the test performed? No .  Cognitive Function:        11/14/2021    8:50 AM  6CIT Screen  What Year? 0 points  What month? 0 points  What time? 0 points  Count back from 20 0 points  Months in reverse 0 points  Repeat phrase 0 points  Total Score 0 points    Immunizations Immunization History  Administered Date(s) Administered   Influenza Split 01/30/2011   Influenza Whole 01/29/2009, 01/29/2010, 01/10/2012, 02/09/2014   Influenza,inj,Quad PF,6+ Mos 01/24/2013, 01/31/2016    Influenza-Unspecified 01/26/2015, 01/04/2018   PFIZER(Purple Top)SARS-COV-2 Vaccination 05/23/2019, 06/13/2019, 02/28/2020   Pneumococcal Conjugate-13 04/17/2014   Pneumococcal Polysaccharide-23 05/02/2003   Td 07/19/2009   Tdap 05/24/2018   Zoster Recombinat (Shingrix) 08/16/2016, 12/15/2016   Zoster, Live 07/31/2012    TDAP status: Up to date  Flu Vaccine status: Up to date  Pneumococcal vaccine status: Up to date  Covid-19 vaccine status: Completed vaccines  Qualifies for Shingles Vaccine? Yes   Zostavax completed Yes   Shingrix Completed?: Yes  Screening Tests Health Maintenance  Topic Date Due   COVID-19 Vaccine (5 - Pfizer series) 08/10/2020   INFLUENZA VACCINE  11/29/2021   TETANUS/TDAP  05/24/2028   Pneumonia Vaccine 57+ Years old  Completed   DEXA SCAN  Completed   Zoster Vaccines- Shingrix  Completed   HPV VACCINES  Aged Out    Health Maintenance  Health Maintenance Due  Topic Date Due   COVID-19 Vaccine (5 - Pfizer series) 08/10/2020    Colorectal cancer screening: No longer required.   Mammogram status: Completed 07/04/2021. Repeat every year  Bone Density status: Completed 03/02/2021.   Lung Cancer Screening: (Low Dose CT Chest recommended if Age 31-80 years, 30 pack-year currently smoking OR have quit w/in 15years.) does not qualify.   Lung Cancer Screening Referral: no  Additional Screening:  Hepatitis C Screening: does not qualify;   Vision Screening: Recommended annual ophthalmology exams for early detection of glaucoma and other disorders of the eye. Is the patient up to date with their annual eye exam?  Yes  Who is the provider or what is the name of the office in which the patient attends annual eye exams? [facility] If pt is not established with a provider, would they like to be referred to a provider to establish care? No .   Dental Screening: Recommended annual dental exams for proper oral hygiene  Community Resource Referral /  Chronic Care Management: CRR required this visit?  No   CCM required this visit?  No      Plan:     I have personally reviewed and noted the following in the patient's chart:   Medical and social history Use of alcohol, tobacco or illicit drugs  Current medications and supplements including opioid prescriptions.  Functional ability and status Nutritional status Physical activity Advanced directives List of other physicians Hospitalizations, surgeries, and ER visits in previous 12 months Vitals Screenings to include cognitive, depression, and falls Referrals and appointments  In addition, I have reviewed and discussed with patient certain preventive protocols, quality metrics, and best practice recommendations. A written personalized care plan for preventive services as well as general preventive health recommendations were provided to patient.     Kellie Simmering, LPN   8/92/1194   Nurse Notes: Patient is concerned about hair loss. She would like any recommendations to help with this.  Due to this being a virtual visit, the after visit summary with patients personalized plan was offered to patient via  mail or my-chart.  Patient preferred to pick up at office at next visit

## 2021-11-15 DIAGNOSIS — J329 Chronic sinusitis, unspecified: Secondary | ICD-10-CM | POA: Diagnosis not present

## 2021-11-15 DIAGNOSIS — J342 Deviated nasal septum: Secondary | ICD-10-CM | POA: Diagnosis not present

## 2021-11-15 DIAGNOSIS — J310 Chronic rhinitis: Secondary | ICD-10-CM | POA: Diagnosis not present

## 2021-11-15 DIAGNOSIS — J343 Hypertrophy of nasal turbinates: Secondary | ICD-10-CM | POA: Diagnosis not present

## 2021-11-15 DIAGNOSIS — J3489 Other specified disorders of nose and nasal sinuses: Secondary | ICD-10-CM | POA: Diagnosis not present

## 2021-11-17 DIAGNOSIS — J3081 Allergic rhinitis due to animal (cat) (dog) hair and dander: Secondary | ICD-10-CM | POA: Diagnosis not present

## 2021-11-17 DIAGNOSIS — J3089 Other allergic rhinitis: Secondary | ICD-10-CM | POA: Diagnosis not present

## 2021-11-17 DIAGNOSIS — J301 Allergic rhinitis due to pollen: Secondary | ICD-10-CM | POA: Diagnosis not present

## 2021-11-24 DIAGNOSIS — J301 Allergic rhinitis due to pollen: Secondary | ICD-10-CM | POA: Diagnosis not present

## 2021-11-24 DIAGNOSIS — J3089 Other allergic rhinitis: Secondary | ICD-10-CM | POA: Diagnosis not present

## 2021-11-24 DIAGNOSIS — J3081 Allergic rhinitis due to animal (cat) (dog) hair and dander: Secondary | ICD-10-CM | POA: Diagnosis not present

## 2021-12-01 ENCOUNTER — Encounter: Payer: Self-pay | Admitting: Obstetrics & Gynecology

## 2021-12-01 ENCOUNTER — Ambulatory Visit (INDEPENDENT_AMBULATORY_CARE_PROVIDER_SITE_OTHER): Payer: Medicare Other | Admitting: Obstetrics & Gynecology

## 2021-12-01 VITALS — BP 114/64 | HR 81 | Ht 62.25 in | Wt 116.0 lb

## 2021-12-01 DIAGNOSIS — B009 Herpesviral infection, unspecified: Secondary | ICD-10-CM

## 2021-12-01 DIAGNOSIS — Z01419 Encounter for gynecological examination (general) (routine) without abnormal findings: Secondary | ICD-10-CM

## 2021-12-01 DIAGNOSIS — Z780 Asymptomatic menopausal state: Secondary | ICD-10-CM

## 2021-12-01 DIAGNOSIS — Z9189 Other specified personal risk factors, not elsewhere classified: Secondary | ICD-10-CM

## 2021-12-01 DIAGNOSIS — J301 Allergic rhinitis due to pollen: Secondary | ICD-10-CM | POA: Diagnosis not present

## 2021-12-01 DIAGNOSIS — Z9289 Personal history of other medical treatment: Secondary | ICD-10-CM

## 2021-12-01 DIAGNOSIS — M8589 Other specified disorders of bone density and structure, multiple sites: Secondary | ICD-10-CM

## 2021-12-01 DIAGNOSIS — J3089 Other allergic rhinitis: Secondary | ICD-10-CM | POA: Diagnosis not present

## 2021-12-01 NOTE — Progress Notes (Signed)
[name] [name] [birth year] [id number]   History:    86 y.o. G1P1L1 Widowed   RP:  Established patient presenting for annual gyn exam   HPI: Postmenopausal, well on no hormone replacement therapy.  No postmenopausal bleeding.  No pelvic pain.  Abstinent.  Pap Neg 10/2018.  No indication to repeat a Pap at this time.  Urine and bowel movements normal.  Colono 06/2007.  Breasts normal. Mammo Neg 06/2021. Body mass index 21.05. Walking regularly.  BD 03/2021 Osteopenia.  Active at church and Psychologist, occupational at [employer]. Health labs with family physician.    Past medical history,surgical history, family history and social history were all reviewed and documented in the EPIC chart.  Gynecologic History No LMP recorded. Patient is postmenopausal.  Obstetric History OB History  Gravida Para Term Preterm AB Living  '1 1 1   '$ 0 1  SAB IAB Ectopic Multiple Live Births               # Outcome Date GA Lbr Len/2nd Weight Sex Delivery Anes PTL Lv  1 Term              ROS: A ROS was performed and pertinent positives and negatives are included in the history. GENERAL: No fevers or chills. HEENT: No change in vision, no earache, sore throat or sinus congestion. NECK: No pain or stiffness. CARDIOVASCULAR: No chest pain or pressure. No palpitations. PULMONARY: No shortness of breath, cough or wheeze. GASTROINTESTINAL: No abdominal pain, nausea, vomiting or diarrhea, melena or bright red blood per rectum. GENITOURINARY: No urinary frequency, urgency, hesitancy or dysuria. MUSCULOSKELETAL: No joint or muscle pain, no back pain, no recent trauma. DERMATOLOGIC: No rash, no itching, no lesions. ENDOCRINE: No polyuria, polydipsia, no heat or cold intolerance. No recent change in weight. HEMATOLOGICAL: No anemia or easy bruising or bleeding. NEUROLOGIC: No headache, seizures, numbness, tingling or weakness. PSYCHIATRIC: No depression, no loss of interest in normal activity or change in sleep pattern.     Exam:   BP  114/64   Pulse 81   Ht 5' 2.25" (1.581 m)   Wt 116 lb (52.6 kg)   SpO2 99%   BMI 21.05 kg/m   Body mass index is 21.05 kg/m.  General appearance : Well developed well nourished female. No acute distress HEENT: Eyes: no retinal hemorrhage or exudates,  Neck supple, trachea midline, no carotid bruits, no thyroidmegaly Lungs: Clear to auscultation, no rhonchi or wheezes, or rib retractions  Heart: Regular rate and rhythm, no murmurs or gallops Breast:Examined in sitting and supine position were symmetrical in appearance, no palpable masses or tenderness,  no skin retraction, no nipple inversion, no nipple discharge, no skin discoloration, no axillary or supraclavicular lymphadenopathy Abdomen: no palpable masses or tenderness, no rebound or guarding Extremities: no edema or skin discoloration or tenderness  Pelvic: Vulva: Normal             Vagina: No gross lesions or discharge  Cervix: No gross lesions or discharge  Uterus  AV, normal size, shape and consistency, non-tender and mobile  Adnexa  Without masses or tenderness  Anus: Normal   Assessment/Plan:  86 y.o. female for annual exam   1. Well female exam with routine gynecological exam Postmenopausal, well on no hormone replacement therapy.  No postmenopausal bleeding.  No pelvic pain.  Abstinent.  Pap Neg 10/2018.  No indication to repeat a Pap at this time.  Urine and bowel movements normal.  Colono 06/2007.  Breasts normal. Mammo  Neg 06/2021. Body mass index 21.05. Walking regularly.  BD 03/2021 Osteopenia.  Active at church and Psychologist, occupational at [employer]. Health labs with family physician.   2. Postmenopause Postmenopausal, well on no hormone replacement therapy.  No postmenopausal bleeding.  No pelvic pain.  Abstinent.   3. Osteopenia of multiple sites Body mass index 21.05. Walking regularly.  BD 03/2021 Osteopenia.   4. Personal history of other medical treatment   Princess Bruins MD, 1:40 PM 12/01/2021

## 2021-12-08 DIAGNOSIS — J3081 Allergic rhinitis due to animal (cat) (dog) hair and dander: Secondary | ICD-10-CM | POA: Diagnosis not present

## 2021-12-08 DIAGNOSIS — J301 Allergic rhinitis due to pollen: Secondary | ICD-10-CM | POA: Diagnosis not present

## 2021-12-08 DIAGNOSIS — J3089 Other allergic rhinitis: Secondary | ICD-10-CM | POA: Diagnosis not present

## 2021-12-15 DIAGNOSIS — J301 Allergic rhinitis due to pollen: Secondary | ICD-10-CM | POA: Diagnosis not present

## 2021-12-15 DIAGNOSIS — J3081 Allergic rhinitis due to animal (cat) (dog) hair and dander: Secondary | ICD-10-CM | POA: Diagnosis not present

## 2021-12-15 DIAGNOSIS — J3089 Other allergic rhinitis: Secondary | ICD-10-CM | POA: Diagnosis not present

## 2021-12-22 DIAGNOSIS — J301 Allergic rhinitis due to pollen: Secondary | ICD-10-CM | POA: Diagnosis not present

## 2021-12-22 DIAGNOSIS — J3081 Allergic rhinitis due to animal (cat) (dog) hair and dander: Secondary | ICD-10-CM | POA: Diagnosis not present

## 2021-12-22 DIAGNOSIS — J3089 Other allergic rhinitis: Secondary | ICD-10-CM | POA: Diagnosis not present

## 2021-12-29 DIAGNOSIS — J3081 Allergic rhinitis due to animal (cat) (dog) hair and dander: Secondary | ICD-10-CM | POA: Diagnosis not present

## 2021-12-29 DIAGNOSIS — J301 Allergic rhinitis due to pollen: Secondary | ICD-10-CM | POA: Diagnosis not present

## 2021-12-29 DIAGNOSIS — J3089 Other allergic rhinitis: Secondary | ICD-10-CM | POA: Diagnosis not present

## 2022-01-05 DIAGNOSIS — J3081 Allergic rhinitis due to animal (cat) (dog) hair and dander: Secondary | ICD-10-CM | POA: Diagnosis not present

## 2022-01-05 DIAGNOSIS — J301 Allergic rhinitis due to pollen: Secondary | ICD-10-CM | POA: Diagnosis not present

## 2022-01-05 DIAGNOSIS — J3089 Other allergic rhinitis: Secondary | ICD-10-CM | POA: Diagnosis not present

## 2022-01-12 DIAGNOSIS — J301 Allergic rhinitis due to pollen: Secondary | ICD-10-CM | POA: Diagnosis not present

## 2022-01-12 DIAGNOSIS — J3089 Other allergic rhinitis: Secondary | ICD-10-CM | POA: Diagnosis not present

## 2022-01-19 DIAGNOSIS — J301 Allergic rhinitis due to pollen: Secondary | ICD-10-CM | POA: Diagnosis not present

## 2022-01-19 DIAGNOSIS — J3081 Allergic rhinitis due to animal (cat) (dog) hair and dander: Secondary | ICD-10-CM | POA: Diagnosis not present

## 2022-01-19 DIAGNOSIS — J3089 Other allergic rhinitis: Secondary | ICD-10-CM | POA: Diagnosis not present

## 2022-01-20 NOTE — Pre-Procedure Instructions (Signed)
Surgical Instructions    Your procedure is scheduled on February 01, 2022.  Report to [facility] Main Entrance "A" at 6:30 A.M., then check in with the Admitting office.  Call this number if you have problems the morning of surgery:  [phone]   If you have any questions prior to your surgery date call [phone]: Open Monday-Friday 8am-4pm    Remember:  Do not eat after midnight the night before your surgery  You may drink clear liquids until 5:30 AM the morning of your surgery.   Clear liquids allowed are: Water, Non-Citrus Juices (without pulp), Carbonated Beverages, Clear Tea, Black Coffee Only (NO MILK, CREAM OR POWDERED CREAMER of any kind), and Gatorade.     Take these medicines the morning of surgery with A SIP OF WATER:  ALPRAZolam (XANAX)   carboxymethylcellulose (REFRESH TEARS)  diltiazem (CARDIZEM CD)     Take these medicines the morning of surgery with a sip of water AS NEEDED:  azelastine (ASTELIN) nasal spray  EPINEPHrine (EPI-PEN)  fluticasone (FLONASE)  nitroGLYCERIN (NITROSTAT)  sodium chloride (OCEAN) 0.65 % SOLN nasal spray   Follow your surgeon's instructions on when to stop Aspirin.  If no instructions were given by your surgeon then you will need to call the office to get those instructions.     As of today, STOP taking any Aleve, Naproxen, Ibuprofen, Motrin, Advil, Goody's, BC's, all herbal medications, fish oil, and all vitamins.                     Do NOT Smoke (Tobacco/Vaping) for 24 hours prior to your procedure.  If you use a CPAP at night, you may bring your mask/headgear for your overnight stay.   Contacts, glasses, piercing's, hearing aid's, dentures or partials may not be worn into surgery, please bring cases for these belongings.    For patients admitted to the hospital, discharge time will be determined by your treatment team.   Patients discharged the day of surgery will not be allowed to drive home, and someone needs to stay with  them for 24 hours.  SURGICAL WAITING ROOM VISITATION Patients having surgery or a procedure may have no more than 2 support people in the waiting area - these visitors may rotate.   Children under the age of 80 must have an adult with them who is not the patient. If the patient needs to stay at the hospital during part of their recovery, the visitor guidelines for inpatient rooms apply. Pre-op nurse will coordinate an appropriate time for 1 support person to accompany patient in pre-op.  This support person may not rotate.   Please refer to the [facility] website for the visitor guidelines for Inpatients (after your surgery is over and you are in a regular room).    Special instructions:   [facility]- Preparing For Surgery  Before surgery, you can play an important role. Because skin is not sterile, your skin needs to be as free of germs as possible. You can reduce the number of germs on your skin by washing with CHG (chlorahexidine gluconate) Soap before surgery.  CHG is an antiseptic cleaner which kills germs and bonds with the skin to continue killing germs even after washing.    Oral Hygiene is also important to reduce your risk of infection.  Remember - BRUSH YOUR TEETH THE MORNING OF SURGERY WITH YOUR REGULAR TOOTHPASTE  Please do not use if you have an allergy to CHG or antibacterial soaps. If your skin  becomes reddened/irritated stop using the CHG.  Do not shave (including legs and underarms) for at least 48 hours prior to first CHG shower. It is OK to shave your face.  Please follow these instructions carefully.   Shower the NIGHT BEFORE SURGERY and the MORNING OF SURGERY  If you chose to wash your hair, wash your hair first as usual with your normal shampoo.  After you shampoo, rinse your hair and body thoroughly to remove the shampoo.  Use CHG Soap as you would any other liquid soap. You can apply CHG directly to the skin and wash gently with a scrungie or a clean washcloth.    Apply the CHG Soap to your body ONLY FROM THE NECK DOWN.  Do not use on open wounds or open sores. Avoid contact with your eyes, ears, mouth and genitals (private parts). Wash Face and genitals (private parts)  with your normal soap.   Wash thoroughly, paying special attention to the area where your surgery will be performed.  Thoroughly rinse your body with warm water from the neck down.  DO NOT shower/wash with your normal soap after using and rinsing off the CHG Soap.  Pat yourself dry with a CLEAN TOWEL.  Wear CLEAN PAJAMAS to bed the night before surgery  Place CLEAN SHEETS on your bed the night before your surgery  DO NOT SLEEP WITH PETS.   Day of Surgery: Take a shower with CHG soap. Do not wear jewelry or makeup Do not wear lotions, powders, perfumes/colognes, or deodorant. Do not shave 48 hours prior to surgery.  Men may shave face and neck. Do not bring valuables to the hospital.  [facility] is not responsible for any belongings or valuables. Do not wear nail polish, gel polish, artificial nails, or any other type of covering on natural nails (fingers and toes) If you have artificial nails or gel coating that need to be removed by a nail salon, please have this removed prior to surgery. Artificial nails or gel coating may interfere with anesthesia's ability to adequately monitor your vital signs.  Wear Clean/Comfortable clothing the morning of surgery Remember to brush your teeth WITH YOUR REGULAR TOOTHPASTE.   Please read over the following fact sheets that you were given.    If you received a COVID test during your pre-op visit  it is requested that you wear a mask when out in public, stay away from anyone that may not be feeling well and notify your surgeon if you develop symptoms. If you have been in contact with anyone that has tested positive in the last 10 days please notify you surgeon.

## 2022-01-23 ENCOUNTER — Telehealth: Payer: Self-pay | Admitting: Cardiology

## 2022-01-23 ENCOUNTER — Telehealth: Payer: Self-pay | Admitting: *Deleted

## 2022-01-23 ENCOUNTER — Other Ambulatory Visit: Payer: Self-pay

## 2022-01-23 ENCOUNTER — Encounter (HOSPITAL_COMMUNITY)
Admission: RE | Admit: 2022-01-23 | Discharge: 2022-01-23 | Disposition: A | Discharge status: Home / Self Care | Payer: Medicare Other | Source: Ambulatory Visit | Attending: Otolaryngology | Admitting: Otolaryngology

## 2022-01-23 ENCOUNTER — Encounter (HOSPITAL_COMMUNITY): Payer: Self-pay

## 2022-01-23 VITALS — BP 155/75 | HR 86 | Temp 98.2°F | Resp 17 | Ht 63.0 in | Wt 119.2 lb

## 2022-01-23 DIAGNOSIS — Z01812 Encounter for preprocedural laboratory examination: Secondary | ICD-10-CM | POA: Insufficient documentation

## 2022-01-23 DIAGNOSIS — I2510 Atherosclerotic heart disease of native coronary artery without angina pectoris: Secondary | ICD-10-CM | POA: Insufficient documentation

## 2022-01-23 DIAGNOSIS — Z01818 Encounter for other preprocedural examination: Secondary | ICD-10-CM

## 2022-01-23 HISTORY — DX: Unspecified osteoarthritis, unspecified site: M19.90

## 2022-01-23 HISTORY — DX: Nausea with vomiting, unspecified: R11.2

## 2022-01-23 HISTORY — DX: Other specified postprocedural states: Z98.890

## 2022-01-23 LAB — CBC
HCT: 43.6 % (ref 36.0–46.0)
Hemoglobin: 14.7 g/dL (ref 12.0–15.0)
MCH: 34.1 pg — ABNORMAL HIGH (ref 26.0–34.0)
MCHC: 33.7 g/dL (ref 30.0–36.0)
MCV: 101.2 fL — ABNORMAL HIGH (ref 80.0–100.0)
Platelets: 262 10*3/uL (ref 150–400)
RBC: 4.31 MIL/uL (ref 3.87–5.11)
RDW: 12 % (ref 11.5–15.5)
WBC: 5.6 10*3/uL (ref 4.0–10.5)
nRBC: 0 % (ref 0.0–0.2)

## 2022-01-23 LAB — BASIC METABOLIC PANEL
Anion gap: 10 (ref 5–15)
BUN: 9 mg/dL (ref 8–23)
CO2: 25 mmol/L (ref 22–32)
Calcium: 9.5 mg/dL (ref 8.9–10.3)
Chloride: 96 mmol/L — ABNORMAL LOW (ref 98–111)
Creatinine, Ser: 0.73 mg/dL (ref 0.44–1.00)
GFR, Estimated: >60 mL/min (ref >60)
Glucose, Bld: 92 mg/dL (ref 70–99)
Potassium: 4.5 mmol/L (ref 3.5–5.1)
Sodium: 131 mmol/L — ABNORMAL LOW (ref 135–145)

## 2022-01-23 NOTE — Telephone Encounter (Signed)
Pt agreeable to plan of care for tele pre op appt9/26/23 @ 10:40, add on due to procedure date and ASA hold time.  Med rec and consent are done.

## 2022-01-23 NOTE — Telephone Encounter (Signed)
Pt agreeable to plan of care for tele pre op appt9/26/23 @ 10:40, add on due to procedure date and ASA hold time.  Med rec and consent are done.      Patient Consent for Virtual Visit        [name] [name] has provided verbal consent on 01/23/2022 for a virtual visit (video or telephone).   CONSENT FOR VIRTUAL VISIT FOR:  [name] [name]  By participating in this virtual visit I agree to the following:  I hereby voluntarily request, consent and authorize [facility] and its employed or contracted physicians, physician assistants, nurse practitioners or other licensed health care professionals (the Practitioner), to provide me with telemedicine health care services (the "Services") as deemed necessary by the treating Practitioner. I acknowledge and consent to receive the Services by the Practitioner via telemedicine. I understand that the telemedicine visit will involve communicating with the Practitioner through live audiovisual communication technology and the disclosure of certain medical information by electronic transmission. I acknowledge that I have been given the opportunity to request an in-person assessment or other available alternative prior to the telemedicine visit and am voluntarily participating in the telemedicine visit.  I understand that I have the right to withhold or withdraw my consent to the use of telemedicine in the course of my care at any time, without affecting my right to future care or treatment, and that the Practitioner or I may terminate the telemedicine visit at any time. I understand that I have the right to inspect all information obtained and/or recorded in the course of the telemedicine visit and may receive copies of available information for a reasonable fee.  I understand that some of the potential risks of receiving the Services via telemedicine include:  Delay or interruption in medical evaluation due to technological equipment failure or  disruption; Information transmitted may not be sufficient (e.g. poor resolution of images) to allow for appropriate medical decision making by the Practitioner; and/or  In rare instances, security protocols could fail, causing a breach of personal health information.  Furthermore, I acknowledge that it is my responsibility to provide information about my medical history, conditions and care that is complete and accurate to the best of my ability. I acknowledge that Practitioner's advice, recommendations, and/or decision may be based on factors not within their control, such as incomplete or inaccurate data provided by me or distortions of diagnostic images or specimens that may result from electronic transmissions. I understand that the practice of medicine is not an exact science and that Practitioner makes no warranties or guarantees regarding treatment outcomes. I acknowledge that a copy of this consent can be made available to me via my patient portal ([place]), or I can request a printed copy by calling the office of [place].    I understand that my insurance will be billed for this visit.   I have read or had this consent read to me. I understand the contents of this consent, which adequately explains the benefits and risks of the Services being provided via telemedicine.  I have been provided ample opportunity to ask questions regarding this consent and the Services and have had my questions answered to my satisfaction. I give my informed consent for the services to be provided through the use of telemedicine in my medical care

## 2022-01-23 NOTE — Telephone Encounter (Signed)
   [facility] Pre-operative Risk Assessment    Request for surgical clearance:  What type of surgery is being performed?   Septoplasty and Turbinate Reduction Endoscopic Sinus Surgery with Fusion  When is this surgery scheduled?  02/01/22   What type of clearance is required (medical clearance vs. Pharmacy clearance to hold med vs. Both)?  Both   Are there any medications that need to be held prior to surgery and how long? Aspirin--they are deferring to cardiology for advisement  Practice name and name of physician performing surgery?  [facility]  Dr. Wilburn Cornelia    What is your office phone number? [phone]    7.   What is your office fax number? [phone]  8.   Anesthesia type (None, local, MAC, general) ?  General    Zara Council 01/23/2022, 11:23 AM

## 2022-01-23 NOTE — Telephone Encounter (Signed)
   Name: [name] [name]  DOB: Sep 11, 1935  MRN: [id number]  Primary Cardiologist: None   Preoperative team, please contact this patient and set up a phone call appointment for further preoperative risk assessment. Please obtain consent and complete medication review. Thank you for your help.  I confirm that guidance regarding antiplatelet and oral anticoagulation therapy has been completed and, if necessary, noted below.  Patient takes Aspirin 81 mg daily. Regarding ASA therapy, we recommend continuation of ASA throughout the perioperative period.  However, if the surgeon feels that cessation of ASA is required in the perioperative period, it may be stopped 5-7 days prior to surgery with a plan to resume it as soon as felt to be feasible from a surgical standpoint in the post-operative period.   Lenna Sciara, NP 01/23/2022, 12:46 PM [place]

## 2022-01-23 NOTE — Progress Notes (Signed)
PCP - Dr. Pricilla Holm Cardiologist - Dr. Minus Breeding  PPM/ICD - Denies Device Orders - n/a Rep Notified - n/a  Chest x-ray - Per pt, had a CXR in May 2023 by Dr. Lowella Grip and it was negative EKG - 08/15/2021 Stress Test - 01/14/2015 ECHO - Per pt, had one done with Dr. Percival Spanish years ago but can't remember when. Cardiac Cath - Per pt, had one done in 1980s.  Sleep Study - Denies CPAP - n/a  No DM  Blood Thinner Instructions: n/a Aspirin Instructions: No instructions given by Dr. Wilburn Cornelia. Instructions to follow-up with his office this afternoon regarding when to stop ASA. Pt understood.   ERAS Protcol - Yes. Clear liquids until 0530 morning of surgery PRE-SURGERY Ensure or G2- n/a. None ordered  COVID TEST- n/a   Anesthesia review: Yes. Discussed with Karoline Caldwell, PA-C regard ECHO pending result.   Patient denies shortness of breath, fever, cough and chest pain at PAT appointment   All instructions explained to the patient, with a verbal understanding of the material. Patient agrees to go over the instructions while at home for a better understanding. Patient also instructed to self quarantine after being tested for COVID-19. The opportunity to ask questions was provided.

## 2022-01-24 ENCOUNTER — Encounter (HOSPITAL_BASED_OUTPATIENT_CLINIC_OR_DEPARTMENT_OTHER): Payer: Self-pay | Admitting: Family

## 2022-01-24 ENCOUNTER — Other Ambulatory Visit: Payer: Self-pay | Admitting: Otolaryngology

## 2022-01-24 ENCOUNTER — Ambulatory Visit (INDEPENDENT_AMBULATORY_CARE_PROVIDER_SITE_OTHER): Payer: Medicare Other | Admitting: Family

## 2022-01-24 DIAGNOSIS — Z01818 Encounter for other preprocedural examination: Secondary | ICD-10-CM

## 2022-01-24 NOTE — Progress Notes (Signed)
Virtual Visit via Telephone Note   Because of [name] [name]'s co-morbid illnesses, she is at least at moderate risk for complications without adequate follow up.  This format is felt to be most appropriate for this patient at this time.  The patient did not have access to video technology/had technical difficulties with video requiring transitioning to audio format only (telephone).  All issues noted in this document were discussed and addressed.  No physical exam could be performed with this format.  Please refer to the patient's chart for her consent to telehealth for [facility].    Date:  01/24/2022   ID:  [name] [name], DOB 12-14-1935, MRN [id number] The patient was identified using 2 identifiers.  Patient Location: Home Provider Location: Home Office   PCP:  Hoyt Koch, MD   [place] [facility] Cardiologist:  Minus Breeding, MD     Evaluation Performed:  Follow-Up Visit  Chief Complaint:  Preop  History of Present Illness:    [name] [name] is a 86 y.o. female with palpitations, hypertension.  Previously abnormal EKG and exertional dyspnea led to negative Myoview in 2016.  Last seen 08/15/2021 by Dr. Percival Spanish for palpitations.  Her palpitations were overall quiescent other than a few at the nighttime.  Blood pressure well controlled at home.  She was recommended for follow-up in 1 year.  Presents today for preop clearance for septoplasty and turbinate reduction and endoscopic sinus surgery with fusion scheduled 02/01/2022.  She tells she has been feeling well since last seen. Only complaint is allergy symptoms for which she recently had repeat allergy testing. She is hopeful her upcoming procedure will provide some relief. Exercising regularly at the gym. She has had some swelling in her ankles which is better in the morning.   Reports no shortness of breath nor dyspnea on exertion. Reports no chest pain, pressure, or tightness. No  orthopnea, PND. Reports no palpitations.    Past Medical History:  Diagnosis Date   Anxiety    Arthritis    Bilateral Thumbs   Coronary artery spasm (HCC)    Non obstructive CAD   Diverticulosis of colon    Fibromyalgia    Hx of colonic polyps    Lumbar back pain    MVP (mitral valve prolapse)    No antibiotics required for procedures   Osteoporosis    Palpitations    Pneumonia    twice years ago   PONV (postoperative nausea and vomiting)    Shingles    Past Surgical History:  Procedure Laterality Date   CARDIAC CATHETERIZATION     In 1980s   CATARACT EXTRACTION, BILATERAL     CATARACT EXTRACTION, BILATERAL  05/2018   CHOLECYSTECTOMY  03/2003   COLONOSCOPY     LUMBAR LAMINECTOMY     TONSILLECTOMY     as a child     Current Meds  Medication Sig   ALPRAZolam (XANAX) 0.5 MG tablet Take 1 tablet (0.5 mg total) by mouth 3 (three) times daily as needed for anxiety. (Patient taking differently: Take 0.5 mg by mouth 2 (two) times daily.)   aspirin 81 MG tablet Take 81 mg by mouth daily.   azelastine (ASTELIN) 0.1 % nasal spray Place 1-2 sprays into both nostrils daily as needed for allergies.   B Complex Vitamins (B COMPLEX PO) Take 1 mL by mouth daily.   Biotin 10000 MCG TABS Take 10,000 mcg by mouth daily.   Calcium Carbonate-Vitamin D (CALCIUM 600+D PO)  Take 1 tablet by mouth daily.   carboxymethylcellulose (REFRESH TEARS) 0.5 % SOLN Place 1 drop into both eyes 3 (three) times daily.   cetirizine (ZYRTEC) 10 MG tablet Take 10 mg by mouth at bedtime.   cholecalciferol (VITAMIN D3) 25 MCG (1000 UNIT) tablet Take 1,000 Units by mouth daily.   diltiazem (CARDIZEM CD) 120 MG 24 hr capsule TAKE ONE CAPSULE BY MOUTH DAILY   EPINEPHrine (EPI-PEN) 0.3 mg/0.3 mL DEVI Inject 0.3 mg into the muscle as needed (anaphylaxis).   fish oil-omega-3 fatty acids 1000 MG capsule Take 1 g by mouth 2 (two) times daily.   fluticasone (FLONASE) 50 MCG/ACT nasal spray Place 2 sprays into both  nostrils daily as needed for allergies or rhinitis.   folic acid (FOLVITE) 202 MCG tablet Take 800 mcg by mouth daily.   ketoconazole (NIZORAL) 2 % shampoo Apply 1 Application topically 2 (two) times a week.   Multiple Vitamins-Minerals (QC OCUHEALTH VISION SUPPORT 2 PO) Take 1 tablet by mouth daily.   nitroGLYCERIN (NITROSTAT) 0.4 MG SL tablet Place 1 tablet (0.4 mg total) under the tongue every 5 (five) minutes as needed. May repeat 3 times   sodium chloride (OCEAN) 0.65 % SOLN nasal spray Place 1 spray into both nostrils as needed for congestion.   traZODone (DESYREL) 100 MG tablet Take 1 tablet (100 mg total) by mouth at bedtime.   TURMERIC PO Take 1 tablet by mouth 2 (two) times daily.   UNABLE TO FIND Allergy shots weekly   White Petrolatum-Mineral Oil (REFRESH P.M. OP) Place 1 Application into both eyes once a week.     Allergies:   Codeine, Erythromycin, Other, Cefdinir, and Levocetirizine   Social History   Tobacco Use   Smoking status: Former    Packs/day: 0.50    Years: 15.00    Total pack years: 7.50    Types: Cigarettes    Quit date: 05/01/1978    Years since quitting: 43.7   Smokeless tobacco: Never  Vaping Use   Vaping Use: Never used  Substance Use Topics   Alcohol use: Yes    Alcohol/week: 9.0 standard drinks of alcohol    Types: 9 Glasses of wine per week   Drug use: No     Family Hx: The patient's family history includes Allergies in her maternal grandfather; Asthma in her maternal grandfather; COPD in her maternal aunt; Heart disease in her father; Heart failure in her father; Hypertension in her brother and mother; Rheum arthritis in her mother; Stroke in her mother.  ROS:   Please see the history of present illness.     All other systems reviewed and are negative.   Prior CV studies:   The following studies were reviewed today:  None  Labs/Other Tests and Data Reviewed:    EKG:  No ECG reviewed.  Recent Labs: 11/07/2021: ALT 24 01/23/2022: BUN  9; Creatinine, Ser 0.73; Hemoglobin 14.7; Platelets 262; Potassium 4.5; Sodium 131   Recent Lipid Panel Lab Results  Component Value Date/Time   CHOL 190 11/07/2021 08:40 AM   TRIG 101.0 11/07/2021 08:40 AM   HDL 88.40 11/07/2021 08:40 AM   CHOLHDL 2 11/07/2021 08:40 AM   LDLCALC 82 11/07/2021 08:40 AM   LDLDIRECT 79.4 06/29/2010 08:03 AM    Wt Readings from Last 3 Encounters:  01/23/22 119 lb 3.2 oz (54.1 kg)  12/01/21 116 lb (52.6 kg)  11/14/21 113 lb (51.3 kg)        Objective:    Vital  Signs:  None available today.   Given telephonic nature of communication, physical exam is limited: Alert and oriented x3. No acute distress. Normal affect. Speech and respirations are unlabored.   ASSESSMENT & PLAN:    Preop clearance - Upcoming ENT procedure. According to the Revised Cardiac Risk Index (RCRI), her Perioperative Risk of Major Cardiac Event is (%): 0.4. Her  Functional Capacity in METs is: 4.64 according to the Duke Activity Status Index (DASI). She is acceptable risk for the planned procedure without additional cardiovascular testing. Recommend holding aspirin, fish oil for 7 days prior to procedure.   LE edema - Likely etiology venous insufficiency. Recommend elevate legs, wear compression stockings, follow low salt diet.      Time:   Today, I have spent 8 minutes with the patient with telehealth technology discussing the above problems.     Medication Adjustments/Labs and Tests Ordered: Current medicines are reviewed at length with the patient today.  Concerns regarding medicines are outlined above.   Tests Ordered: No orders of the defined types were placed in this encounter.   Medication Changes: No orders of the defined types were placed in this encounter.   Follow Up:  In Person  05/2022  Signed, Loel Dubonnet, NP  01/24/2022 10:48 AM    [place]

## 2022-01-24 NOTE — Patient Instructions (Signed)
Medication Instructions:  Continue your current medications.   *If you need a refill on your cardiac medications before your next appointment, please call your pharmacy*  Lab Work: None ordered today.   Testing/Procedures: None ordered today.   Follow-Up: At [facility], you and your health needs are our priority.  As part of our continuing mission to provide you with exceptional heart care, we have created designated Provider Care Teams.  These Care Teams include your primary Cardiologist (physician) and Advanced Practice Providers (APPs -  Physician Assistants and Nurse Practitioners) who all work together to provide you with the care you need, when you need it.  We recommend signing up for the patient portal called "MyChart".  Sign up information is provided on this After Visit Summary.  MyChart is used to connect with patients for Virtual Visits (Telemedicine).  Patients are able to view lab/test results, encounter notes, upcoming appointments, etc.  Non-urgent messages can be sent to your provider as well.   To learn more about what you can do with MyChart, go to [email or web address].    Your next appointment:   January 2024 with Dr. Percival Spanish   Other Instructions  To prevent or reduce lower extremity swelling: Eat a low salt diet. Salt makes the body hold onto extra fluid which causes swelling. Sit with legs elevated. For example, in the recliner or on an [place].  Wear knee-high compression stockings during the daytime. Ones labeled 15-20 mmHg provide good compression.

## 2022-01-24 NOTE — Anesthesia Preprocedure Evaluation (Addendum)
Anesthesia Evaluation  Patient identified by MRN, date of birth, ID band Patient awake    Reviewed: Allergy & Precautions, NPO status , Patient's Chart, lab work & pertinent test results  History of Anesthesia Complications (+) PONV and history of anesthetic complications  Airway Mallampati: III  TM Distance: >3 FB Neck ROM: Full    Dental  (+) Caps, Dental Advisory Given   Pulmonary former smoker,    breath sounds clear to auscultation       Cardiovascular hypertension, Pt. on medications + CAD (non-obstructive)   Rhythm:Regular Rate:Normal  H/o coronary artery spasm   Neuro/Psych negative neurological ROS     GI/Hepatic negative GI ROS, Neg liver ROS,   Endo/Other  negative endocrine ROS  Renal/GU negative Renal ROS     Musculoskeletal  (+) Arthritis , Fibromyalgia -  Abdominal   Peds  Hematology negative hematology ROS (+)   Anesthesia Other Findings   Reproductive/Obstetrics                           Anesthesia Physical Anesthesia Plan  ASA: 3  Anesthesia Plan: General   Post-op Pain Management: Tylenol PO (pre-op)*   Induction: Intravenous  PONV Risk Score and Plan: 4 or greater and Ondansetron, Dexamethasone, Treatment may vary due to age or medical condition and Propofol infusion  Airway Management Planned: Oral ETT and Video Laryngoscope Planned  Additional Equipment: None  Intra-op Plan:   Post-operative Plan: Extubation in OR  Informed Consent: I have reviewed the patients History and Physical, chart, labs and discussed the procedure including the risks, benefits and alternatives for the proposed anesthesia with the patient or authorized representative who has indicated his/her understanding and acceptance.     Dental advisory given  Plan Discussed with: CRNA  Anesthesia Plan Comments: (Preoperative cardiology evaluation 01/24/22 by Laurann Montana, NP: "Preop  clearance - Upcoming ENT procedure. According to the Revised Cardiac Risk Index (RCRI), her Perioperative Risk of Major Cardiac Event is (%): 0.4. Her  Functional Capacity in METs is: 4.64 according to the Duke Activity Status Index (DASI). She is acceptable risk for the planned procedure without additional cardiovascular testing. Recommend holding aspirin, fish oil for 7 days prior to procedure")      Anesthesia Quick Evaluation

## 2022-01-26 DIAGNOSIS — J301 Allergic rhinitis due to pollen: Secondary | ICD-10-CM | POA: Diagnosis not present

## 2022-01-26 DIAGNOSIS — J3089 Other allergic rhinitis: Secondary | ICD-10-CM | POA: Diagnosis not present

## 2022-02-01 ENCOUNTER — Encounter (HOSPITAL_COMMUNITY)
Admission: RE | Disposition: A | Discharge status: Home / Self Care | Payer: Self-pay | Source: Ambulatory Visit | Attending: Otolaryngology

## 2022-02-01 ENCOUNTER — Ambulatory Visit (HOSPITAL_COMMUNITY): Payer: Medicare Other | Admitting: Physician Assistant

## 2022-02-01 ENCOUNTER — Encounter (HOSPITAL_COMMUNITY): Payer: Self-pay | Admitting: Otolaryngology

## 2022-02-01 ENCOUNTER — Ambulatory Visit (HOSPITAL_BASED_OUTPATIENT_CLINIC_OR_DEPARTMENT_OTHER): Payer: Medicare Other | Admitting: Anesthesiology

## 2022-02-01 ENCOUNTER — Ambulatory Visit (HOSPITAL_COMMUNITY)
Admission: RE | Admit: 2022-02-01 | Discharge: 2022-02-01 | Disposition: A | Discharge status: Home / Self Care | Payer: Medicare Other | Source: Ambulatory Visit | Attending: Otolaryngology | Admitting: Otolaryngology

## 2022-02-01 DIAGNOSIS — I10 Essential (primary) hypertension: Secondary | ICD-10-CM | POA: Diagnosis not present

## 2022-02-01 DIAGNOSIS — Z87891 Personal history of nicotine dependence: Secondary | ICD-10-CM | POA: Diagnosis not present

## 2022-02-01 DIAGNOSIS — I2510 Atherosclerotic heart disease of native coronary artery without angina pectoris: Secondary | ICD-10-CM | POA: Diagnosis not present

## 2022-02-01 DIAGNOSIS — Z8261 Family history of arthritis: Secondary | ICD-10-CM | POA: Insufficient documentation

## 2022-02-01 DIAGNOSIS — J343 Hypertrophy of nasal turbinates: Secondary | ICD-10-CM | POA: Diagnosis not present

## 2022-02-01 DIAGNOSIS — Z8249 Family history of ischemic heart disease and other diseases of the circulatory system: Secondary | ICD-10-CM | POA: Diagnosis not present

## 2022-02-01 DIAGNOSIS — Z79899 Other long term (current) drug therapy: Secondary | ICD-10-CM | POA: Insufficient documentation

## 2022-02-01 DIAGNOSIS — M1990 Unspecified osteoarthritis, unspecified site: Secondary | ICD-10-CM | POA: Insufficient documentation

## 2022-02-01 DIAGNOSIS — M797 Fibromyalgia: Secondary | ICD-10-CM | POA: Insufficient documentation

## 2022-02-01 DIAGNOSIS — J342 Deviated nasal septum: Secondary | ICD-10-CM | POA: Diagnosis present

## 2022-02-01 DIAGNOSIS — J329 Chronic sinusitis, unspecified: Secondary | ICD-10-CM | POA: Insufficient documentation

## 2022-02-01 DIAGNOSIS — J3489 Other specified disorders of nose and nasal sinuses: Secondary | ICD-10-CM | POA: Diagnosis not present

## 2022-02-01 DIAGNOSIS — J349 Unspecified disorder of nose and nasal sinuses: Secondary | ICD-10-CM | POA: Diagnosis not present

## 2022-02-01 HISTORY — PX: NASAL SEPTOPLASTY W/ TURBINOPLASTY: SHX2070

## 2022-02-01 HISTORY — PX: SINUS ENDO WITH FUSION: SHX5329

## 2022-02-01 SURGERY — SURGERY, PARANASAL SINUS, ENDOSCOPIC, WITH NASAL SEPTOPLASTY, TURBINOPLASTY, AND MAXILLARY SINUSOTOMY
Anesthesia: General | Site: Nose | Laterality: Right

## 2022-02-01 MED ORDER — ONDANSETRON HCL 4 MG/2ML IJ SOLN
INTRAMUSCULAR | Status: DC | PRN
  Administered 2022-02-01: 4 mg via INTRAVENOUS

## 2022-02-01 MED ORDER — LIDOCAINE-EPINEPHRINE 1 %-1:100000 IJ SOLN
INTRAMUSCULAR | Status: DC | PRN
  Administered 2022-02-01: 10 mL

## 2022-02-01 MED ORDER — SODIUM CHLORIDE 0.9 % IR SOLN
Status: DC | PRN
  Administered 2022-02-01: 1000 mL

## 2022-02-01 MED ORDER — DEXAMETHASONE SODIUM PHOSPHATE 10 MG/ML IJ SOLN
INTRAMUSCULAR | Status: DC | PRN
  Administered 2022-02-01: 10 mg via INTRAVENOUS

## 2022-02-01 MED ORDER — LIDOCAINE 2% (20 MG/ML) 5 ML SYRINGE
INTRAMUSCULAR | Status: DC | PRN
  Administered 2022-02-01: 30 mg via INTRAVENOUS

## 2022-02-01 MED ORDER — 0.9 % SODIUM CHLORIDE (POUR BTL) OPTIME
TOPICAL | Status: DC | PRN
  Administered 2022-02-01: 1000 mL

## 2022-02-01 MED ORDER — OXYMETAZOLINE HCL 0.05 % NA SOLN
NASAL | Status: AC
  Filled 2022-02-01: qty 30

## 2022-02-01 MED ORDER — ROCURONIUM BROMIDE 10 MG/ML (PF) SYRINGE
PREFILLED_SYRINGE | INTRAVENOUS | Status: AC
  Filled 2022-02-01: qty 10

## 2022-02-01 MED ORDER — CHLORHEXIDINE GLUCONATE 0.12 % MT SOLN
OROMUCOSAL | Status: AC
  Administered 2022-02-01: 15 mL via OROMUCOSAL
  Filled 2022-02-01: qty 15

## 2022-02-01 MED ORDER — CEFAZOLIN SODIUM-DEXTROSE 2-4 GM/100ML-% IV SOLN
INTRAVENOUS | Status: AC
  Filled 2022-02-01: qty 100

## 2022-02-01 MED ORDER — TRIAMCINOLONE ACETONIDE 40 MG/ML IJ SUSP
INTRAMUSCULAR | Status: AC
  Filled 2022-02-01: qty 5

## 2022-02-01 MED ORDER — CEPHALEXIN 500 MG PO CAPS: 500.0000 mg | ORAL_CAPSULE | Freq: Three times a day (TID) | ORAL | 0 refills | Status: AC

## 2022-02-01 MED ORDER — LIDOCAINE-EPINEPHRINE 1 %-1:100000 IJ SOLN
INTRAMUSCULAR | Status: AC
  Filled 2022-02-01: qty 1

## 2022-02-01 MED ORDER — DEXAMETHASONE SODIUM PHOSPHATE 10 MG/ML IJ SOLN
INTRAMUSCULAR | Status: AC
  Filled 2022-02-01: qty 1

## 2022-02-01 MED ORDER — MUPIROCIN 2 % EX OINT
TOPICAL_OINTMENT | CUTANEOUS | Status: AC
  Filled 2022-02-01: qty 22

## 2022-02-01 MED ORDER — PROPOFOL 10 MG/ML IV BOLUS
INTRAVENOUS | Status: DC | PRN
  Administered 2022-02-01: 80 mg via INTRAVENOUS

## 2022-02-01 MED ORDER — ACETAMINOPHEN 500 MG PO TABS: 1000.0000 mg | ORAL_TABLET | Freq: Once | ORAL | Status: AC

## 2022-02-01 MED ORDER — SUGAMMADEX SODIUM 200 MG/2ML IV SOLN
INTRAVENOUS | Status: DC | PRN
  Administered 2022-02-01: 200 mg via INTRAVENOUS

## 2022-02-01 MED ORDER — ROCURONIUM BROMIDE 10 MG/ML (PF) SYRINGE
PREFILLED_SYRINGE | INTRAVENOUS | Status: DC | PRN
  Administered 2022-02-01: 60 mg via INTRAVENOUS

## 2022-02-01 MED ORDER — ONDANSETRON HCL 4 MG/2ML IJ SOLN
INTRAMUSCULAR | Status: AC
  Filled 2022-02-01: qty 2

## 2022-02-01 MED ORDER — CEFAZOLIN SODIUM-DEXTROSE 2-3 GM-%(50ML) IV SOLR
INTRAVENOUS | Status: DC | PRN
  Administered 2022-02-01: 2 g via INTRAVENOUS

## 2022-02-01 MED ORDER — MUPIROCIN CALCIUM 2 % EX CREA
TOPICAL_CREAM | CUTANEOUS | Status: AC
  Filled 2022-02-01: qty 15

## 2022-02-01 MED ORDER — ORAL CARE MOUTH RINSE: 15.0000 mL | Freq: Once | OROMUCOSAL | Status: AC

## 2022-02-01 MED ORDER — FENTANYL CITRATE (PF) 100 MCG/2ML IJ SOLN
25.0000 ug | INTRAMUSCULAR | Status: DC | PRN
  Administered 2022-02-01 (×2): 50 ug via INTRAVENOUS

## 2022-02-01 MED ORDER — FENTANYL CITRATE (PF) 250 MCG/5ML IJ SOLN
INTRAMUSCULAR | Status: AC
  Filled 2022-02-01: qty 5

## 2022-02-01 MED ORDER — LIDOCAINE 2% (20 MG/ML) 5 ML SYRINGE
INTRAMUSCULAR | Status: AC
  Filled 2022-02-01: qty 5

## 2022-02-01 MED ORDER — FENTANYL CITRATE (PF) 250 MCG/5ML IJ SOLN
INTRAMUSCULAR | Status: DC | PRN
  Administered 2022-02-01: 100 ug via INTRAVENOUS

## 2022-02-01 MED ORDER — FENTANYL CITRATE (PF) 100 MCG/2ML IJ SOLN
INTRAMUSCULAR | Status: AC
  Filled 2022-02-01: qty 2

## 2022-02-01 MED ORDER — CHLORHEXIDINE GLUCONATE 0.12 % MT SOLN: 15.0000 mL | Freq: Once | OROMUCOSAL | Status: AC

## 2022-02-01 MED ORDER — OXYMETAZOLINE HCL 0.05 % NA SOLN
NASAL | Status: DC | PRN
  Administered 2022-02-01: 1

## 2022-02-01 MED ORDER — LACTATED RINGERS IV SOLN: INTRAVENOUS | Status: DC

## 2022-02-01 MED ORDER — PROPOFOL 10 MG/ML IV BOLUS
INTRAVENOUS | Status: AC
  Filled 2022-02-01: qty 20

## 2022-02-01 MED ORDER — ACETAMINOPHEN 500 MG PO TABS
ORAL_TABLET | ORAL | Status: AC
  Administered 2022-02-01: 1000 mg via ORAL
  Filled 2022-02-01: qty 2

## 2022-02-01 SURGICAL SUPPLY — 44 items
BLADE ROTATE RAD 40 4 M4 (BLADE) IMPLANT
BLADE ROTATE TRICUT 4X13 M4 (BLADE) ×3 IMPLANT
CANISTER SUCT 3000ML PPV (MISCELLANEOUS) ×6 IMPLANT
COAGULATOR SUCT SWTCH 10FR 6 (ELECTROSURGICAL) IMPLANT
DRESSING NASAL KENNEDY 3.5X.9 (MISCELLANEOUS) IMPLANT
DRSG NASAL KENNEDY 3.5X.9 (MISCELLANEOUS)
ELECT COATED BLADE 2.86 ST (ELECTRODE) IMPLANT
ELECT REM PT RETURN 9FT ADLT (ELECTROSURGICAL) ×2
ELECTRODE REM PT RTRN 9FT ADLT (ELECTROSURGICAL) ×3 IMPLANT
FILTER ARTHROSCOPY CONVERTOR (FILTER) ×3 IMPLANT
GAUZE SPONGE 2X2 8PLY STRL LF (GAUZE/BANDAGES/DRESSINGS) ×3 IMPLANT
GLOVE BIOGEL M 7.0 STRL (GLOVE) ×6 IMPLANT
GOWN STRL REUS W/ TWL LRG LVL3 (GOWN DISPOSABLE) ×6 IMPLANT
GOWN STRL REUS W/TWL LRG LVL3 (GOWN DISPOSABLE) ×4
IV NS 1000ML (IV SOLUTION) ×2
IV NS 1000ML BAXH (IV SOLUTION) ×3 IMPLANT
KIT BASIN OR (CUSTOM PROCEDURE TRAY) ×3 IMPLANT
KIT TURNOVER KIT B (KITS) ×3 IMPLANT
NDL 18GX1X1/2 (RX/OR ONLY) (NEEDLE) IMPLANT
NDL HYPO 25GX1X1/2 BEV (NEEDLE) ×3 IMPLANT
NEEDLE 18GX1X1/2 (RX/OR ONLY) (NEEDLE) IMPLANT
NEEDLE HYPO 25GX1X1/2 BEV (NEEDLE) ×2 IMPLANT
NS IRRIG 1000ML POUR BTL (IV SOLUTION) ×3 IMPLANT
PAD ARMBOARD 7.5X6 YLW CONV (MISCELLANEOUS) ×6 IMPLANT
PENCIL SMOKE EVACUATOR (MISCELLANEOUS) IMPLANT
SPECIMEN JAR SMALL (MISCELLANEOUS) ×3 IMPLANT
SPLINT NASAL DOYLE BI-VL (GAUZE/BANDAGES/DRESSINGS) ×3 IMPLANT
SPONGE NEURO XRAY DETECT 1X3 (DISPOSABLE) ×3 IMPLANT
SUT ETHILON 3 0 FSL (SUTURE) IMPLANT
SUT ETHILON 3 0 PS 1 (SUTURE) ×3 IMPLANT
SUT PLAIN 4 0 ~~LOC~~ 1 (SUTURE) ×3 IMPLANT
SWAB COLLECTION DEVICE MRSA (MISCELLANEOUS) IMPLANT
SWAB CULTURE ESWAB REG 1ML (MISCELLANEOUS) IMPLANT
SYR CONTROL 10ML LL (SYRINGE) ×3 IMPLANT
TOWEL GREEN STERILE FF (TOWEL DISPOSABLE) ×3 IMPLANT
TRACKER ENT INSTRUMENT (MISCELLANEOUS) ×3 IMPLANT
TRACKER ENT PATIENT (MISCELLANEOUS) ×3 IMPLANT
TRAY ENT MC OR (CUSTOM PROCEDURE TRAY) ×3 IMPLANT
TUBE CONNECTING 12X1/4 (SUCTIONS) ×3 IMPLANT
TUBE SALEM SUMP 16 FR W/ARV (TUBING) ×3 IMPLANT
TUBING EXTENTION W/L.L. (IV SETS) ×3 IMPLANT
TUBING STRAIGHTSHOT EPS 5PK (TUBING) ×3 IMPLANT
WATER STERILE IRR 1000ML POUR (IV SOLUTION) ×3 IMPLANT
WIPE INSTRUMENT VISIWIPE 73X73 (MISCELLANEOUS) ×3 IMPLANT

## 2022-02-01 NOTE — Anesthesia Procedure Notes (Signed)
Procedure Name: Intubation Date/Time: 02/01/2022 9:04 AM  Performed by: Michele Rockers, CRNAPre-anesthesia Checklist: Patient identified, Patient being monitored, Timeout performed, Emergency Drugs available and Suction available Patient Re-evaluated:Patient Re-evaluated prior to induction Oxygen Delivery Method: Circle System Utilized Preoxygenation: Pre-oxygenation with 100% oxygen Induction Type: IV induction Ventilation: Mask ventilation without difficulty Laryngoscope Size: Mac, 3 and Glidescope Grade View: Grade I Tube type: Oral Tube size: 7.0 mm Number of attempts: 1 Airway Equipment and Method: Stylet and Video-laryngoscopy Placement Confirmation: ETT inserted through vocal cords under direct vision, positive ETCO2 and breath sounds checked- equal and bilateral Secured at: 21 cm Tube secured with: Tape Dental Injury: Teeth and Oropharynx as per pre-operative assessment

## 2022-02-01 NOTE — Transfer of Care (Signed)
Immediate Anesthesia Transfer of Care Note  Patient: [name] [name]  Procedure(s) Performed: ENDOSCOPIC RESECTION OF RIGHT CONCHA BULLOSA WITH FUSION (Right: Nose) NASAL SEPTOPLASTY AND INFERIOR TURBINATE REDUCTION (Bilateral: Nose)  Patient Location: PACU  Anesthesia Type:General  Level of Consciousness: awake, alert , oriented, patient cooperative, and responds to stimulation  Airway & Oxygen Therapy: Patient Spontanous Breathing  Post-op Assessment: Post -op Vital signs reviewed and stable and Patient moving all extremities X 4  Post vital signs: stable  Last Vitals:  Vitals Value Taken Time  BP    Temp    Pulse    Resp 14 02/01/22 1006  SpO2    Vitals shown include unvalidated device data.  Last Pain:  Vitals:   02/01/22 0656  TempSrc:   PainSc: 0-No pain         Complications: No notable events documented.

## 2022-02-01 NOTE — Op Note (Signed)
Operative Note:  ENDOSCOPIC SINUS SURGERY WITH NAVIGATION    SEPTOPLASTY    INFERIOR TURBINATE REDUCTION  Patient: [name] [name]  Medical record number: [id number]  Date:02/01/2022  Pre-operative Indications: 1.  Right middle turbinate concha bullosum     2.  Severe nasal septal deviation     3.  Bilateral inferior turbinate hypertrophy  Postoperative Indications: Same  Surgical Procedure: 1.  Endoscopic resection of right middle turbinate concha bullosum with intraoperative computer-assisted navigation (Fusion)    2.  Nasal septoplasty    3. Bilateral Inferior Turbinate Reduction  Anesthesia: GET  Surgeon: Delsa Bern, M.D.  Complications: None  EBL: 30 cc cc  Findings: Significant nasal airway obstruction from large right middle turbinate concha and severe left septal deviation and bilateral inferior turbinate hypertrophy.  No nasal packing placed.  Bilateral Doyle nasal septal splints placed at the conclusion of the surgical procedure.   Brief History: The patient is a 86 y.o. female with a history of chronic sinusitis and turbinate hypertrophy. The patient has been on medical therapy to reduce nasal mucosal edema and infection including antibiotics, saline nasal spray and topical nasal steroids. Despite appropriate medical therapy the patient continues to have ongoing symptoms. Given the patient's history and findings, the above surgical procedures were recommended, risks and benefits were discussed in detail with the patient may understand and agree with our plan for surgery which is scheduled at [place] in OR under general anesthesia as an outpatient.  Surgical Procedure: The patient is brought to the operating room on 02/01/2022 and placed in supine position on the operating table. General endotracheal anesthesia was established without difficulty. When the patient was adequately anesthetized, surgical timeout was performed with correct identification of the patient  and the surgical procedure. The patient's nose was then injected with 8 cc of 1% lidocaine 1:100,000 dilution epinephrine which was injected in a submucosal fashion. The patient's nose was then packed with Afrin-soaked cottonoid pledgets were left in place for approximately 10 minutes to allow for  vasoconstriction and hemostasis.  The Xomed Fusion navigation headgear was applied in anatomic and surgical landmarks were identified and confirmed, navigation was used throughout the sinus component of the surgical procedure.  With the patient prepped draped and prepared for surgery, nasal nasal endoscopy was performed on the bilaterally.  Endoscopy showed a very large right middle turbinate obstructive concha and severe left nasal septal deviation.  Using a 0 degree endoscope a sickle knife was used to create a vertical incision in the anterior face of the concha.  Incision was carried through the mucosa along the inferior aspect and anterior aspect.  Endoscopic sinus scissors were then used to create a vertical incision through the concha bone dissecting along the inferior aspect and then removing the lateral component of the concha preserving the underlying mucosa.  Straight microdebrider was used to remove mucosal tissue and create a widely patent right middle meatus.  No evidence of mass, polyp or purulent discharge.  No further sinus surgery required.   A left anterior hemitransfixion incision was created and a mucoperichondrial flap was elevated from anterior to posterior on the left-hand side. The anterior cartilaginous septum was crossed at the midline and a mucoperichondrial flap was elevated on the patient's right.  Swivel knife was then used to resect the anterior and mid cartilaginous portion of the nasal septum.  Resected cartilage was morcellized and returned to the mucoperichondrial pocket at the occlusion of the surgical procedure.  Dissection was then carried  out from anterior to posterior removing  deviated bone and cartilage including a large septal spur the overlying mucosa was preserved.  With the septum brought to good midline position, the morselized cartilage was returned to the mucoperichondrial pocket and the soft tissue/mucosal flaps were reapproximated with interrupted 4-0 gut suture on a Keith needle in a horizontal mattressing fashion.  Anterior hemitransfixion incision was closed with the same stitch.  Bilateral Doyle nasal septal splints were then placed after the application of Bactroban ointment and sutured in position with a 3-0 Ethilon suture.  Attention was then turned to the inferior turbinates, bilateral inferior turbinate intramural cautery was performed with cautery setting at 11 W.  2 submucosal passes were made in each inferior turbinate.  After completing cautery, anterior vertical incisions were created and overlying soft tissue was elevated, a small amount of turbinate bone was resected.  The turbinates were then outfractured to create a more patent nasal passageway.  Surgical sponge count was correct. An oral gastric tube was passed and the stomach contents were aspirated. Patient was awakened from anesthetic and transferred from the operating room to the recovery room in stable condition. There were no complications and blood loss was 30 cc.   Delsa Bern, M.D. [facility] 02/01/2022

## 2022-02-01 NOTE — H&P (Signed)
[name] [name] is an 86 y.o. female.   Chief Complaint: Nasal Obstruction HPI: Hx of progressive nasal obstruction  Past Medical History:  Diagnosis Date   Anxiety    Arthritis    Bilateral Thumbs   Coronary artery spasm (HCC)    Non obstructive CAD   Diverticulosis of colon    Fibromyalgia    Hx of colonic polyps    Lumbar back pain    MVP (mitral valve prolapse)    No antibiotics required for procedures   Osteoporosis    Palpitations    Pneumonia    twice years ago   PONV (postoperative nausea and vomiting)    Shingles     Past Surgical History:  Procedure Laterality Date   CARDIAC CATHETERIZATION     In 1980s   CATARACT EXTRACTION, BILATERAL     CATARACT EXTRACTION, BILATERAL  05/2018   CHOLECYSTECTOMY  03/2003   COLONOSCOPY     LUMBAR LAMINECTOMY     TONSILLECTOMY     as a child    Family History  Problem Relation Age of Onset   Rheum arthritis Mother    Stroke Mother    Hypertension Mother    Heart failure Father    Heart disease Father    Asthma Maternal Grandfather    Allergies Maternal Grandfather    COPD Maternal Aunt         2 aunts   Hypertension Brother    Social History:  reports that she quit smoking about 43 years ago. Her smoking use included cigarettes. She has a 7.50 pack-year smoking history. She has never used smokeless tobacco. She reports current alcohol use of about 9.0 standard drinks of alcohol per week. She reports that she does not use drugs.  Allergies:  Allergies  Allergen Reactions   Codeine Nausea And Vomiting   Erythromycin Nausea And Vomiting   Other     Environmental allergies   Cefdinir Diarrhea and Nausea And Vomiting   Levocetirizine Swelling    Feet swelling (tolerates zyrtec)    Medications Prior to Admission  Medication Sig Dispense Refill   ALPRAZolam (XANAX) 0.5 MG tablet Take 1 tablet (0.5 mg total) by mouth 3 (three) times daily as needed for anxiety. (Patient taking differently: Take 0.5 mg by mouth 2  (two) times daily.) 70 tablet 5   aspirin 81 MG tablet Take 81 mg by mouth daily.     azelastine (ASTELIN) 0.1 % nasal spray Place 1-2 sprays into both nostrils daily as needed for allergies.     B Complex Vitamins (B COMPLEX PO) Take 1 mL by mouth daily.     Biotin 10000 MCG TABS Take 10,000 mcg by mouth daily.     Calcium Carbonate-Vitamin D (CALCIUM 600+D PO) Take 1 tablet by mouth daily.     carboxymethylcellulose (REFRESH TEARS) 0.5 % SOLN Place 1 drop into both eyes 3 (three) times daily.     cetirizine (ZYRTEC) 10 MG tablet Take 10 mg by mouth at bedtime.     cholecalciferol (VITAMIN D3) 25 MCG (1000 UNIT) tablet Take 1,000 Units by mouth daily.     diltiazem (CARDIZEM CD) 120 MG 24 hr capsule TAKE ONE CAPSULE BY MOUTH DAILY 90 capsule 2   EPINEPHrine (EPI-PEN) 0.3 mg/0.3 mL DEVI Inject 0.3 mg into the muscle as needed (anaphylaxis).     fish oil-omega-3 fatty acids 1000 MG capsule Take 1 g by mouth 2 (two) times daily.     fluticasone (FLONASE) 50 MCG/ACT nasal  spray Place 2 sprays into both nostrils daily as needed for allergies or rhinitis.     folic acid (FOLVITE) 686 MCG tablet Take 800 mcg by mouth daily.     ketoconazole (NIZORAL) 2 % shampoo Apply 1 Application topically 2 (two) times a week.     Multiple Vitamins-Minerals (QC OCUHEALTH VISION SUPPORT 2 PO) Take 1 tablet by mouth daily.     nitroGLYCERIN (NITROSTAT) 0.4 MG SL tablet Place 1 tablet (0.4 mg total) under the tongue every 5 (five) minutes as needed. May repeat 3 times 25 tablet 5   sodium chloride (OCEAN) 0.65 % SOLN nasal spray Place 1 spray into both nostrils as needed for congestion.     traZODone (DESYREL) 100 MG tablet Take 1 tablet (100 mg total) by mouth at bedtime. 90 tablet 3   TURMERIC PO Take 1 tablet by mouth 2 (two) times daily.     UNABLE TO FIND Allergy shots weekly     White Petrolatum-Mineral Oil (REFRESH P.M. OP) Place 1 Application into both eyes once a week.      No results found for this or any  previous visit (from the past 48 hour(s)). No results found.  Review of Systems  HENT:  Positive for congestion.     Blood pressure (!) 174/89, pulse 100, temperature 97.6 F (36.4 C), temperature source Oral, resp. rate 18, height '5\' 3"'$  (1.6 m), weight 52.2 kg, SpO2 97 %. Physical Exam Constitutional:      Appearance: Normal appearance.  HENT:     Nose:     Comments: Severe septal deviation Cardiovascular:     Rate and Rhythm: Normal rate.  Pulmonary:     Effort: Pulmonary effort is normal.  Musculoskeletal:     Cervical back: Normal range of motion.  Neurological:     Mental Status: She is alert.      Assessment/Plan Adm for OP nasal airway surgery  Jerrell Belfast, MD 02/01/2022, 8:43 AM

## 2022-02-01 NOTE — Anesthesia Postprocedure Evaluation (Signed)
Anesthesia Post Note  Patient: [name] [name]  Procedure(s) Performed: ENDOSCOPIC RESECTION OF RIGHT CONCHA BULLOSA WITH FUSION (Right: Nose) NASAL SEPTOPLASTY AND INFERIOR TURBINATE REDUCTION (Bilateral: Nose)     Patient location during evaluation: PACU Anesthesia Type: General Level of consciousness: awake and alert, patient cooperative and oriented Pain management: pain level controlled Vital Signs Assessment: post-procedure vital signs reviewed and stable Respiratory status: spontaneous breathing, nonlabored ventilation and respiratory function stable Cardiovascular status: blood pressure returned to baseline and stable Postop Assessment: no apparent nausea or vomiting Anesthetic complications: no   No notable events documented.  Last Vitals:  Vitals:   02/01/22 1020 02/01/22 1035  BP: (!) 151/66 (!) 163/70  Pulse: 61 62  Resp: 13 19  Temp:    SpO2: 97% 98%    Last Pain:  Vitals:   02/01/22 1035  TempSrc:   PainSc: Asleep                 Geralynn Capri,E. Magin Balbi

## 2022-02-02 ENCOUNTER — Other Ambulatory Visit: Payer: Self-pay | Admitting: Cardiology

## 2022-02-02 ENCOUNTER — Encounter (HOSPITAL_COMMUNITY): Payer: Self-pay | Admitting: Otolaryngology

## 2022-02-02 LAB — SURGICAL PATHOLOGY

## 2022-02-16 DIAGNOSIS — J3081 Allergic rhinitis due to animal (cat) (dog) hair and dander: Secondary | ICD-10-CM | POA: Diagnosis not present

## 2022-02-16 DIAGNOSIS — J3089 Other allergic rhinitis: Secondary | ICD-10-CM | POA: Diagnosis not present

## 2022-02-16 DIAGNOSIS — J301 Allergic rhinitis due to pollen: Secondary | ICD-10-CM | POA: Diagnosis not present

## 2022-02-23 DIAGNOSIS — J3089 Other allergic rhinitis: Secondary | ICD-10-CM | POA: Diagnosis not present

## 2022-02-23 DIAGNOSIS — J301 Allergic rhinitis due to pollen: Secondary | ICD-10-CM | POA: Diagnosis not present

## 2022-02-23 DIAGNOSIS — J3081 Allergic rhinitis due to animal (cat) (dog) hair and dander: Secondary | ICD-10-CM | POA: Diagnosis not present

## 2022-03-02 DIAGNOSIS — J3081 Allergic rhinitis due to animal (cat) (dog) hair and dander: Secondary | ICD-10-CM | POA: Diagnosis not present

## 2022-03-02 DIAGNOSIS — J3089 Other allergic rhinitis: Secondary | ICD-10-CM | POA: Diagnosis not present

## 2022-03-02 DIAGNOSIS — J301 Allergic rhinitis due to pollen: Secondary | ICD-10-CM | POA: Diagnosis not present

## 2022-03-09 DIAGNOSIS — J301 Allergic rhinitis due to pollen: Secondary | ICD-10-CM | POA: Diagnosis not present

## 2022-03-09 DIAGNOSIS — J3081 Allergic rhinitis due to animal (cat) (dog) hair and dander: Secondary | ICD-10-CM | POA: Diagnosis not present

## 2022-03-09 DIAGNOSIS — J3089 Other allergic rhinitis: Secondary | ICD-10-CM | POA: Diagnosis not present

## 2022-03-16 DIAGNOSIS — J3081 Allergic rhinitis due to animal (cat) (dog) hair and dander: Secondary | ICD-10-CM | POA: Diagnosis not present

## 2022-03-16 DIAGNOSIS — J3089 Other allergic rhinitis: Secondary | ICD-10-CM | POA: Diagnosis not present

## 2022-03-16 DIAGNOSIS — J301 Allergic rhinitis due to pollen: Secondary | ICD-10-CM | POA: Diagnosis not present

## 2022-03-30 DIAGNOSIS — J3089 Other allergic rhinitis: Secondary | ICD-10-CM | POA: Diagnosis not present

## 2022-03-30 DIAGNOSIS — J301 Allergic rhinitis due to pollen: Secondary | ICD-10-CM | POA: Diagnosis not present

## 2022-03-30 DIAGNOSIS — J3081 Allergic rhinitis due to animal (cat) (dog) hair and dander: Secondary | ICD-10-CM | POA: Diagnosis not present

## 2022-04-03 ENCOUNTER — Ambulatory Visit (INDEPENDENT_AMBULATORY_CARE_PROVIDER_SITE_OTHER): Payer: Medicare Other | Admitting: Internal Medicine

## 2022-04-03 ENCOUNTER — Encounter: Payer: Self-pay | Admitting: Internal Medicine

## 2022-04-03 VITALS — BP 140/60 | HR 92 | Temp 98.2°F | Ht 63.0 in | Wt 118.0 lb

## 2022-04-03 DIAGNOSIS — M7989 Other specified soft tissue disorders: Secondary | ICD-10-CM

## 2022-04-03 DIAGNOSIS — I2510 Atherosclerotic heart disease of native coronary artery without angina pectoris: Secondary | ICD-10-CM

## 2022-04-03 DIAGNOSIS — R0609 Other forms of dyspnea: Secondary | ICD-10-CM | POA: Diagnosis not present

## 2022-04-03 DIAGNOSIS — M5442 Lumbago with sciatica, left side: Secondary | ICD-10-CM

## 2022-04-03 LAB — CBC
HCT: 41.2 % (ref 36.0–46.0)
Hemoglobin: 13.9 g/dL (ref 12.0–15.0)
MCHC: 33.7 g/dL (ref 30.0–36.0)
MCV: 100.5 fl — ABNORMAL HIGH (ref 78.0–100.0)
Platelets: 230 10*3/uL (ref 150.0–400.0)
RBC: 4.1 Mil/uL (ref 3.87–5.11)
RDW: 13.2 % (ref 11.5–15.5)
WBC: 7.5 10*3/uL (ref 4.0–10.5)

## 2022-04-03 LAB — COMPREHENSIVE METABOLIC PANEL
ALT: 16 U/L (ref 0–35)
AST: 20 U/L (ref 0–37)
Albumin: 4.2 g/dL (ref 3.5–5.2)
Alkaline Phosphatase: 121 U/L — ABNORMAL HIGH (ref 39–117)
BUN: 11 mg/dL (ref 6–23)
CO2: 29 mEq/L (ref 19–32)
Calcium: 9.4 mg/dL (ref 8.4–10.5)
Chloride: 100 mEq/L (ref 96–112)
Creatinine, Ser: 0.92 mg/dL (ref 0.40–1.20)
GFR: 57.03 mL/min — ABNORMAL LOW (ref >60.00)
Glucose, Bld: 111 mg/dL — ABNORMAL HIGH (ref 70–99)
Potassium: 4.6 mEq/L (ref 3.5–5.1)
Sodium: 134 mEq/L — ABNORMAL LOW (ref 135–145)
Total Bilirubin: 0.3 mg/dL (ref 0.2–1.2)
Total Protein: 7.1 g/dL (ref 6.0–8.3)

## 2022-04-03 LAB — BRAIN NATRIURETIC PEPTIDE: Pro B Natriuretic peptide (BNP): 102 pg/mL — ABNORMAL HIGH (ref 0.0–100.0)

## 2022-04-03 MED ORDER — PREDNISONE 20 MG PO TABS: 40.0000 mg | ORAL_TABLET | Freq: Every day | ORAL | 0 refills | Status: AC

## 2022-04-03 NOTE — Progress Notes (Unsigned)
   Subjective:   Patient ID: [name] [name], female    DOB: 06-08-1935, 86 y.o.   MRN: [id number]  HPI Right baker's cyst 06/2021. Leg swelling  Review of Systems  Objective:  Physical Exam  Vitals:   04/03/22 1455 04/03/22 1458  BP: (!) 140/60 (!) 140/60  Pulse: 92   Temp: 98.2 F (36.8 C)   TempSrc: Oral   SpO2: 96%   Weight: 118 lb (53.5 kg)   Height: '5\' 3"'$  (1.6 m)     Assessment & Plan:

## 2022-04-03 NOTE — Patient Instructions (Signed)
We will check the labs today and if normal will send a fluid pill in to use daily as needed.

## 2022-04-04 ENCOUNTER — Encounter: Payer: Self-pay | Admitting: Internal Medicine

## 2022-04-04 ENCOUNTER — Ambulatory Visit (HOSPITAL_COMMUNITY): Payer: Medicare Other | Attending: Internal Medicine

## 2022-04-04 DIAGNOSIS — R0609 Other forms of dyspnea: Secondary | ICD-10-CM | POA: Insufficient documentation

## 2022-04-04 DIAGNOSIS — M7989 Other specified soft tissue disorders: Secondary | ICD-10-CM | POA: Insufficient documentation

## 2022-04-04 LAB — ECHOCARDIOGRAM COMPLETE
Area-P 1/2: 7.09 cm2
MV M vel: 5.23 m/s
MV Peak grad: 109.4 mmHg
P 1/2 time: 414 msec
S' Lateral: 3.1 cm

## 2022-04-04 NOTE — Assessment & Plan Note (Signed)
Suspect due to less activity due to back pain in last few months. Checking BNP and since this was elevated checking echo which was ordered once labs returned.

## 2022-04-04 NOTE — Assessment & Plan Note (Signed)
Checking CBC, CMP, BNP and treat as appropriate. If normal will rx lasix 20 mg daily prn to see if this helps.

## 2022-04-04 NOTE — Assessment & Plan Note (Addendum)
Suspect flare of low back osteoarthritis. Previously did PT for this and this helped. She still has those exercises and is trying to use those. She is also using tylenol otc. We are ordering prednisone 40 mg daily for 5 days to see if this helps more.

## 2022-04-06 ENCOUNTER — Telehealth: Payer: Self-pay

## 2022-04-06 DIAGNOSIS — J301 Allergic rhinitis due to pollen: Secondary | ICD-10-CM | POA: Diagnosis not present

## 2022-04-06 DIAGNOSIS — J3089 Other allergic rhinitis: Secondary | ICD-10-CM | POA: Diagnosis not present

## 2022-04-06 DIAGNOSIS — J3081 Allergic rhinitis due to animal (cat) (dog) hair and dander: Secondary | ICD-10-CM | POA: Diagnosis not present

## 2022-04-06 NOTE — Telephone Encounter (Signed)
Resulted

## 2022-04-13 DIAGNOSIS — J3089 Other allergic rhinitis: Secondary | ICD-10-CM | POA: Diagnosis not present

## 2022-04-13 DIAGNOSIS — J3081 Allergic rhinitis due to animal (cat) (dog) hair and dander: Secondary | ICD-10-CM | POA: Diagnosis not present

## 2022-04-13 DIAGNOSIS — J301 Allergic rhinitis due to pollen: Secondary | ICD-10-CM | POA: Diagnosis not present

## 2022-04-27 DIAGNOSIS — J3089 Other allergic rhinitis: Secondary | ICD-10-CM | POA: Diagnosis not present

## 2022-04-27 DIAGNOSIS — J3081 Allergic rhinitis due to animal (cat) (dog) hair and dander: Secondary | ICD-10-CM | POA: Diagnosis not present

## 2022-04-27 DIAGNOSIS — J301 Allergic rhinitis due to pollen: Secondary | ICD-10-CM | POA: Diagnosis not present

## 2022-04-28 DIAGNOSIS — Z23 Encounter for immunization: Secondary | ICD-10-CM | POA: Diagnosis not present

## 2022-05-04 DIAGNOSIS — J3089 Other allergic rhinitis: Secondary | ICD-10-CM | POA: Diagnosis not present

## 2022-05-04 DIAGNOSIS — J3081 Allergic rhinitis due to animal (cat) (dog) hair and dander: Secondary | ICD-10-CM | POA: Diagnosis not present

## 2022-05-04 DIAGNOSIS — J301 Allergic rhinitis due to pollen: Secondary | ICD-10-CM | POA: Diagnosis not present

## 2022-05-11 DIAGNOSIS — J3089 Other allergic rhinitis: Secondary | ICD-10-CM | POA: Diagnosis not present

## 2022-05-11 DIAGNOSIS — J301 Allergic rhinitis due to pollen: Secondary | ICD-10-CM | POA: Diagnosis not present

## 2022-05-11 DIAGNOSIS — J3081 Allergic rhinitis due to animal (cat) (dog) hair and dander: Secondary | ICD-10-CM | POA: Diagnosis not present

## 2022-05-18 DIAGNOSIS — J3089 Other allergic rhinitis: Secondary | ICD-10-CM | POA: Diagnosis not present

## 2022-05-18 DIAGNOSIS — J301 Allergic rhinitis due to pollen: Secondary | ICD-10-CM | POA: Diagnosis not present

## 2022-05-19 DIAGNOSIS — J3089 Other allergic rhinitis: Secondary | ICD-10-CM | POA: Diagnosis not present

## 2022-05-19 DIAGNOSIS — J301 Allergic rhinitis due to pollen: Secondary | ICD-10-CM | POA: Diagnosis not present

## 2022-05-22 DIAGNOSIS — M5451 Vertebrogenic low back pain: Secondary | ICD-10-CM | POA: Diagnosis not present

## 2022-05-24 DIAGNOSIS — J310 Chronic rhinitis: Secondary | ICD-10-CM | POA: Diagnosis not present

## 2022-05-24 DIAGNOSIS — J342 Deviated nasal septum: Secondary | ICD-10-CM | POA: Diagnosis not present

## 2022-05-24 DIAGNOSIS — J343 Hypertrophy of nasal turbinates: Secondary | ICD-10-CM | POA: Diagnosis not present

## 2022-05-25 DIAGNOSIS — J301 Allergic rhinitis due to pollen: Secondary | ICD-10-CM | POA: Diagnosis not present

## 2022-05-25 DIAGNOSIS — J3081 Allergic rhinitis due to animal (cat) (dog) hair and dander: Secondary | ICD-10-CM | POA: Diagnosis not present

## 2022-05-25 DIAGNOSIS — J3089 Other allergic rhinitis: Secondary | ICD-10-CM | POA: Diagnosis not present

## 2022-06-01 DIAGNOSIS — J3089 Other allergic rhinitis: Secondary | ICD-10-CM | POA: Diagnosis not present

## 2022-06-01 DIAGNOSIS — J301 Allergic rhinitis due to pollen: Secondary | ICD-10-CM | POA: Diagnosis not present

## 2022-06-01 DIAGNOSIS — J3081 Allergic rhinitis due to animal (cat) (dog) hair and dander: Secondary | ICD-10-CM | POA: Diagnosis not present

## 2022-06-02 ENCOUNTER — Other Ambulatory Visit: Payer: Self-pay | Admitting: Internal Medicine

## 2022-06-08 DIAGNOSIS — J301 Allergic rhinitis due to pollen: Secondary | ICD-10-CM | POA: Diagnosis not present

## 2022-06-08 DIAGNOSIS — J3089 Other allergic rhinitis: Secondary | ICD-10-CM | POA: Diagnosis not present

## 2022-06-15 DIAGNOSIS — J3089 Other allergic rhinitis: Secondary | ICD-10-CM | POA: Diagnosis not present

## 2022-06-15 DIAGNOSIS — J3081 Allergic rhinitis due to animal (cat) (dog) hair and dander: Secondary | ICD-10-CM | POA: Diagnosis not present

## 2022-06-15 DIAGNOSIS — J301 Allergic rhinitis due to pollen: Secondary | ICD-10-CM | POA: Diagnosis not present

## 2022-06-19 DIAGNOSIS — M5459 Other low back pain: Secondary | ICD-10-CM | POA: Diagnosis not present

## 2022-06-19 DIAGNOSIS — M5451 Vertebrogenic low back pain: Secondary | ICD-10-CM | POA: Diagnosis not present

## 2022-06-20 DIAGNOSIS — G96191 Perineural cyst: Secondary | ICD-10-CM | POA: Diagnosis not present

## 2022-06-20 DIAGNOSIS — M5136 Other intervertebral disc degeneration, lumbar region: Secondary | ICD-10-CM | POA: Diagnosis not present

## 2022-06-22 DIAGNOSIS — J3081 Allergic rhinitis due to animal (cat) (dog) hair and dander: Secondary | ICD-10-CM | POA: Diagnosis not present

## 2022-06-22 DIAGNOSIS — J3089 Other allergic rhinitis: Secondary | ICD-10-CM | POA: Diagnosis not present

## 2022-06-22 DIAGNOSIS — J301 Allergic rhinitis due to pollen: Secondary | ICD-10-CM | POA: Diagnosis not present

## 2022-06-29 DIAGNOSIS — J3081 Allergic rhinitis due to animal (cat) (dog) hair and dander: Secondary | ICD-10-CM | POA: Diagnosis not present

## 2022-06-29 DIAGNOSIS — J3089 Other allergic rhinitis: Secondary | ICD-10-CM | POA: Diagnosis not present

## 2022-06-29 DIAGNOSIS — J301 Allergic rhinitis due to pollen: Secondary | ICD-10-CM | POA: Diagnosis not present

## 2022-07-04 DIAGNOSIS — Z1231 Encounter for screening mammogram for malignant neoplasm of breast: Secondary | ICD-10-CM | POA: Diagnosis not present

## 2022-07-04 DIAGNOSIS — M5136 Other intervertebral disc degeneration, lumbar region: Secondary | ICD-10-CM | POA: Diagnosis not present

## 2022-07-06 ENCOUNTER — Encounter: Payer: Self-pay | Admitting: Obstetrics & Gynecology

## 2022-07-06 DIAGNOSIS — J3081 Allergic rhinitis due to animal (cat) (dog) hair and dander: Secondary | ICD-10-CM | POA: Diagnosis not present

## 2022-07-06 DIAGNOSIS — J301 Allergic rhinitis due to pollen: Secondary | ICD-10-CM | POA: Diagnosis not present

## 2022-07-06 DIAGNOSIS — J3089 Other allergic rhinitis: Secondary | ICD-10-CM | POA: Diagnosis not present

## 2022-07-11 DIAGNOSIS — R922 Inconclusive mammogram: Secondary | ICD-10-CM | POA: Diagnosis not present

## 2022-07-11 DIAGNOSIS — R928 Other abnormal and inconclusive findings on diagnostic imaging of breast: Secondary | ICD-10-CM | POA: Diagnosis not present

## 2022-07-12 ENCOUNTER — Encounter: Payer: Self-pay | Admitting: Internal Medicine

## 2022-07-12 DIAGNOSIS — D225 Melanocytic nevi of trunk: Secondary | ICD-10-CM | POA: Diagnosis not present

## 2022-07-12 DIAGNOSIS — L814 Other melanin hyperpigmentation: Secondary | ICD-10-CM | POA: Diagnosis not present

## 2022-07-12 DIAGNOSIS — L820 Inflamed seborrheic keratosis: Secondary | ICD-10-CM | POA: Diagnosis not present

## 2022-07-12 DIAGNOSIS — L309 Dermatitis, unspecified: Secondary | ICD-10-CM | POA: Diagnosis not present

## 2022-07-12 DIAGNOSIS — L821 Other seborrheic keratosis: Secondary | ICD-10-CM | POA: Diagnosis not present

## 2022-07-12 DIAGNOSIS — L570 Actinic keratosis: Secondary | ICD-10-CM | POA: Diagnosis not present

## 2022-07-12 DIAGNOSIS — K130 Diseases of lips: Secondary | ICD-10-CM | POA: Diagnosis not present

## 2022-07-13 DIAGNOSIS — J3081 Allergic rhinitis due to animal (cat) (dog) hair and dander: Secondary | ICD-10-CM | POA: Diagnosis not present

## 2022-07-13 DIAGNOSIS — J301 Allergic rhinitis due to pollen: Secondary | ICD-10-CM | POA: Diagnosis not present

## 2022-07-13 DIAGNOSIS — J3089 Other allergic rhinitis: Secondary | ICD-10-CM | POA: Diagnosis not present

## 2022-07-20 DIAGNOSIS — J3081 Allergic rhinitis due to animal (cat) (dog) hair and dander: Secondary | ICD-10-CM | POA: Diagnosis not present

## 2022-07-20 DIAGNOSIS — H52203 Unspecified astigmatism, bilateral: Secondary | ICD-10-CM | POA: Diagnosis not present

## 2022-07-20 DIAGNOSIS — J301 Allergic rhinitis due to pollen: Secondary | ICD-10-CM | POA: Diagnosis not present

## 2022-07-20 DIAGNOSIS — H04123 Dry eye syndrome of bilateral lacrimal glands: Secondary | ICD-10-CM | POA: Diagnosis not present

## 2022-07-20 DIAGNOSIS — J3089 Other allergic rhinitis: Secondary | ICD-10-CM | POA: Diagnosis not present

## 2022-07-20 DIAGNOSIS — Z961 Presence of intraocular lens: Secondary | ICD-10-CM | POA: Diagnosis not present

## 2022-07-27 DIAGNOSIS — J3089 Other allergic rhinitis: Secondary | ICD-10-CM | POA: Diagnosis not present

## 2022-07-27 DIAGNOSIS — J301 Allergic rhinitis due to pollen: Secondary | ICD-10-CM | POA: Diagnosis not present

## 2022-08-01 DIAGNOSIS — D485 Neoplasm of uncertain behavior of skin: Secondary | ICD-10-CM | POA: Diagnosis not present

## 2022-08-01 DIAGNOSIS — L309 Dermatitis, unspecified: Secondary | ICD-10-CM | POA: Diagnosis not present

## 2022-08-01 DIAGNOSIS — R21 Rash and other nonspecific skin eruption: Secondary | ICD-10-CM | POA: Diagnosis not present

## 2022-08-03 DIAGNOSIS — J3081 Allergic rhinitis due to animal (cat) (dog) hair and dander: Secondary | ICD-10-CM | POA: Diagnosis not present

## 2022-08-03 DIAGNOSIS — J301 Allergic rhinitis due to pollen: Secondary | ICD-10-CM | POA: Diagnosis not present

## 2022-08-03 DIAGNOSIS — J3089 Other allergic rhinitis: Secondary | ICD-10-CM | POA: Diagnosis not present

## 2022-08-10 ENCOUNTER — Encounter: Payer: Self-pay | Admitting: Internal Medicine

## 2022-08-10 DIAGNOSIS — J3089 Other allergic rhinitis: Secondary | ICD-10-CM | POA: Diagnosis not present

## 2022-08-10 DIAGNOSIS — J301 Allergic rhinitis due to pollen: Secondary | ICD-10-CM | POA: Diagnosis not present

## 2022-08-10 DIAGNOSIS — J3081 Allergic rhinitis due to animal (cat) (dog) hair and dander: Secondary | ICD-10-CM | POA: Diagnosis not present

## 2022-08-17 DIAGNOSIS — J301 Allergic rhinitis due to pollen: Secondary | ICD-10-CM | POA: Diagnosis not present

## 2022-08-17 DIAGNOSIS — J3081 Allergic rhinitis due to animal (cat) (dog) hair and dander: Secondary | ICD-10-CM | POA: Diagnosis not present

## 2022-08-17 DIAGNOSIS — J3089 Other allergic rhinitis: Secondary | ICD-10-CM | POA: Diagnosis not present

## 2022-08-20 NOTE — Progress Notes (Unsigned)
  Cardiology Office Note:   Date:  08/21/2022  ID:  [name], DOB 01-Apr-1936, MRN [id number]  History of Present Illness:   [name] [name] is a 87 y.o. female who presents for followup of hypertension and palpitations.   She had dyspnea in 2016.  However, POET (Plain Old Exercise Treadmill) was negative for evidence of ischemia.  She returns for follow up.   Since I last saw her she had septoplasty.  He is known well from a cardiovascular standpoint.  She still goes to the gym.  She volunteers at the hospital.  She is under a lot of stress because her daughter wants her to move into a retirement home and she is considering this.  She does get short of breath climbing stairs but otherwise feels well.  She does not have any resting shortness of breath, PND or orthopnea.  She has no palpitations, presyncope or syncope.  ROS: Positive for sciatic nerve pain. Otherwise as stated in the HPI and negative for all other systems.  Studies Reviewed:    EKG: Sinus rhythm, rate 94, left axis deviation, no acute ST-T wave changes.  Risk Assessment/Calculations:     Physical Exam:   VS:  BP (!) 160/78 (BP Location: Left Arm, Patient Position: Sitting, Cuff Size: Normal)   Pulse 94   Ht 5' (1.524 m)   Wt 116 lb 3.2 oz (52.7 kg)   SpO2 96%   BMI 22.69 kg/m    Wt Readings from Last 3 Encounters:  08/21/22 116 lb 3.2 oz (52.7 kg)  04/03/22 118 lb (53.5 kg)  02/01/22 115 lb (52.2 kg)     GEN: Well nourished, well developed in no acute distress NECK: No JVD; No carotid bruits CARDIAC: Very soft apical nonradiating systolic murmur, no diastolic murmurs, no rubs, no gallops regular rate and rhythm  RESPIRATORY:  Clear to auscultation without rales, wheezing or rhonchi  ABDOMEN: Soft, non-tender, non-distended EXTREMITIES:  No edema; No deformity   ASSESSMENT AND PLAN:   PALPITATIONS:   She does not feel the palpitations any longer.  No change in therapy.    HTN: Her blood pressure is not at  target.  She is going to get a new blood pressure cuff and keep a blood pressure diary.  I might need to adjust based on these readings.   ABNORMAL EKG: She has no new symptoms.  She has some shortness of breath only climbing stairs.  However, this is mild.  I will make further cardiovascular testing is suggested.  She has had no new symptoms since having a stress test that was negative in 2016.  No further work-up.       Signed, Rollene Rotunda, MD

## 2022-08-21 ENCOUNTER — Ambulatory Visit: Payer: Medicare Other | Attending: Cardiology | Admitting: Cardiology

## 2022-08-21 ENCOUNTER — Encounter: Payer: Self-pay | Admitting: Cardiology

## 2022-08-21 VITALS — BP 160/78 | HR 94 | Ht 60.0 in | Wt 116.2 lb

## 2022-08-21 DIAGNOSIS — I10 Essential (primary) hypertension: Secondary | ICD-10-CM | POA: Diagnosis not present

## 2022-08-21 DIAGNOSIS — R002 Palpitations: Secondary | ICD-10-CM | POA: Diagnosis not present

## 2022-08-21 DIAGNOSIS — R9431 Abnormal electrocardiogram [ECG] [EKG]: Secondary | ICD-10-CM | POA: Diagnosis not present

## 2022-08-21 NOTE — Patient Instructions (Signed)
Medication Instructions:  Your physician recommends that you continue on your current medications as directed. Please refer to the Current Medication list given to you today.  *If you need a refill on your cardiac medications before your next appointment, please call your pharmacy*    Follow-Up: At [facility], you and your health needs are our priority.  As part of our continuing mission to provide you with exceptional heart care, we have created designated Provider Care Teams.  These Care Teams include your primary Cardiologist (physician) and Advanced Practice Providers (APPs -  Physician Assistants and Nurse Practitioners) who all work together to provide you with the care you need, when you need it.  We recommend signing up for the patient portal called "MyChart".  Sign up information is provided on this After Visit Summary.  MyChart is used to connect with patients for Virtual Visits (Telemedicine).  Patients are able to view lab/test results, encounter notes, upcoming appointments, etc.  Non-urgent messages can be sent to your provider as well.   To learn more about what you can do with MyChart, go to [email or web address].    Your next appointment:   12 month(s)  Provider:   Rollene Rotunda, MD     Other Instructions Dr. Antoine Poche recommends getting a omron upper arm blood pressure cuff. Available at [facility], [employer] or [employer]  Please send in your blood pressure log in 1-2 weeks for Dr. Antoine Poche to review.   HOW TO TAKE YOUR BLOOD PRESSURE: Rest 5 minutes before taking your blood pressure. Don't smoke or drink caffeinated beverages for at least 30 minutes before. Take your blood pressure before (not after) you eat. Sit comfortably with your back supported and both feet on the floor (don't cross your legs). Elevate your arm to heart level on a table or a desk. Use the proper sized cuff. It should fit smoothly and snugly around your bare upper arm. There should be enough  room to slip a fingertip under the cuff. The bottom edge of the cuff should be 1 inch above the crease of the elbow. Ideally, take 3 measurements at one sitting and record the average.

## 2022-08-24 DIAGNOSIS — J3089 Other allergic rhinitis: Secondary | ICD-10-CM | POA: Diagnosis not present

## 2022-08-24 DIAGNOSIS — J301 Allergic rhinitis due to pollen: Secondary | ICD-10-CM | POA: Diagnosis not present

## 2022-08-31 DIAGNOSIS — J3089 Other allergic rhinitis: Secondary | ICD-10-CM | POA: Diagnosis not present

## 2022-08-31 DIAGNOSIS — J301 Allergic rhinitis due to pollen: Secondary | ICD-10-CM | POA: Diagnosis not present

## 2022-09-05 ENCOUNTER — Telehealth: Payer: Self-pay | Admitting: Cardiology

## 2022-09-05 NOTE — Telephone Encounter (Signed)
Paper Work Dropped Off: Blood Pressure Log  Date: 09/05/2022  Location of paper:  Provider Mailbox

## 2022-09-07 DIAGNOSIS — J3081 Allergic rhinitis due to animal (cat) (dog) hair and dander: Secondary | ICD-10-CM | POA: Diagnosis not present

## 2022-09-07 DIAGNOSIS — J301 Allergic rhinitis due to pollen: Secondary | ICD-10-CM | POA: Diagnosis not present

## 2022-09-07 DIAGNOSIS — J3089 Other allergic rhinitis: Secondary | ICD-10-CM | POA: Diagnosis not present

## 2022-09-12 ENCOUNTER — Telehealth: Payer: Self-pay | Admitting: Cardiology

## 2022-09-12 NOTE — Telephone Encounter (Signed)
Patient dropped off BP log, placed in doctors box.  She states she also wrote note to the provider.  Would like him to review and have him get recommendations as her pulse was low.  She does not have listings to send in this message as left her copy for the provider

## 2022-09-12 NOTE — Telephone Encounter (Signed)
Pt stated she hasn't heard anything since dropping off her Blood pressure log on 09/05/2022. She's requesting a callback as soon as possible. Please advise.

## 2022-09-13 NOTE — Telephone Encounter (Signed)
Placed in folder for dr hochrein's review.

## 2022-09-14 DIAGNOSIS — J301 Allergic rhinitis due to pollen: Secondary | ICD-10-CM | POA: Diagnosis not present

## 2022-09-14 DIAGNOSIS — J3089 Other allergic rhinitis: Secondary | ICD-10-CM | POA: Diagnosis not present

## 2022-09-14 DIAGNOSIS — J3081 Allergic rhinitis due to animal (cat) (dog) hair and dander: Secondary | ICD-10-CM | POA: Diagnosis not present

## 2022-09-14 NOTE — Telephone Encounter (Signed)
Called and gave the patient the information from Dr Antoine Poche. She verbalized understanding.   A copy of the results will need to be made and sent to her PCP. Dr Antoine Poche requested that they be sent to her PCP.

## 2022-09-15 NOTE — Telephone Encounter (Signed)
No change in medications at this time, per dr hochrein.

## 2022-09-19 NOTE — Telephone Encounter (Signed)
Paperwork has been faxed to PCP.

## 2022-09-21 DIAGNOSIS — J3081 Allergic rhinitis due to animal (cat) (dog) hair and dander: Secondary | ICD-10-CM | POA: Diagnosis not present

## 2022-09-21 DIAGNOSIS — J3089 Other allergic rhinitis: Secondary | ICD-10-CM | POA: Diagnosis not present

## 2022-09-21 DIAGNOSIS — J301 Allergic rhinitis due to pollen: Secondary | ICD-10-CM | POA: Diagnosis not present

## 2022-09-26 DIAGNOSIS — H1045 Other chronic allergic conjunctivitis: Secondary | ICD-10-CM | POA: Diagnosis not present

## 2022-09-26 DIAGNOSIS — J3089 Other allergic rhinitis: Secondary | ICD-10-CM | POA: Diagnosis not present

## 2022-09-26 DIAGNOSIS — J301 Allergic rhinitis due to pollen: Secondary | ICD-10-CM | POA: Diagnosis not present

## 2022-09-26 DIAGNOSIS — J3081 Allergic rhinitis due to animal (cat) (dog) hair and dander: Secondary | ICD-10-CM | POA: Diagnosis not present

## 2022-10-04 DIAGNOSIS — J3081 Allergic rhinitis due to animal (cat) (dog) hair and dander: Secondary | ICD-10-CM | POA: Diagnosis not present

## 2022-10-04 DIAGNOSIS — J3089 Other allergic rhinitis: Secondary | ICD-10-CM | POA: Diagnosis not present

## 2022-10-04 DIAGNOSIS — J301 Allergic rhinitis due to pollen: Secondary | ICD-10-CM | POA: Diagnosis not present

## 2022-10-12 DIAGNOSIS — J3089 Other allergic rhinitis: Secondary | ICD-10-CM | POA: Diagnosis not present

## 2022-10-12 DIAGNOSIS — J3081 Allergic rhinitis due to animal (cat) (dog) hair and dander: Secondary | ICD-10-CM | POA: Diagnosis not present

## 2022-10-12 DIAGNOSIS — J301 Allergic rhinitis due to pollen: Secondary | ICD-10-CM | POA: Diagnosis not present

## 2022-10-19 DIAGNOSIS — J301 Allergic rhinitis due to pollen: Secondary | ICD-10-CM | POA: Diagnosis not present

## 2022-10-19 DIAGNOSIS — J3089 Other allergic rhinitis: Secondary | ICD-10-CM | POA: Diagnosis not present

## 2022-10-19 DIAGNOSIS — J3081 Allergic rhinitis due to animal (cat) (dog) hair and dander: Secondary | ICD-10-CM | POA: Diagnosis not present

## 2022-10-26 ENCOUNTER — Other Ambulatory Visit: Payer: Self-pay | Admitting: Cardiology

## 2022-10-26 DIAGNOSIS — J301 Allergic rhinitis due to pollen: Secondary | ICD-10-CM | POA: Diagnosis not present

## 2022-10-26 DIAGNOSIS — J3089 Other allergic rhinitis: Secondary | ICD-10-CM | POA: Diagnosis not present

## 2022-10-26 DIAGNOSIS — J3081 Allergic rhinitis due to animal (cat) (dog) hair and dander: Secondary | ICD-10-CM | POA: Diagnosis not present

## 2022-11-01 DIAGNOSIS — J3089 Other allergic rhinitis: Secondary | ICD-10-CM | POA: Diagnosis not present

## 2022-11-01 DIAGNOSIS — J3081 Allergic rhinitis due to animal (cat) (dog) hair and dander: Secondary | ICD-10-CM | POA: Diagnosis not present

## 2022-11-01 DIAGNOSIS — J301 Allergic rhinitis due to pollen: Secondary | ICD-10-CM | POA: Diagnosis not present

## 2022-11-09 ENCOUNTER — Other Ambulatory Visit: Payer: Self-pay | Admitting: Internal Medicine

## 2022-11-09 ENCOUNTER — Ambulatory Visit (INDEPENDENT_AMBULATORY_CARE_PROVIDER_SITE_OTHER): Payer: Medicare Other

## 2022-11-09 ENCOUNTER — Telehealth: Payer: Self-pay

## 2022-11-09 VITALS — Ht 60.0 in | Wt 115.0 lb

## 2022-11-09 DIAGNOSIS — J3089 Other allergic rhinitis: Secondary | ICD-10-CM | POA: Diagnosis not present

## 2022-11-09 DIAGNOSIS — Z0000 Encounter for general adult medical examination without abnormal findings: Secondary | ICD-10-CM

## 2022-11-09 DIAGNOSIS — J301 Allergic rhinitis due to pollen: Secondary | ICD-10-CM | POA: Diagnosis not present

## 2022-11-09 NOTE — Progress Notes (Signed)
Subjective:   [name] [name] is a 87 y.o. female who presents for Medicare Annual (Subsequent) preventive examination.  Visit Complete: Virtual  I connected with  [name] [name] on 11/09/22 by a audio enabled telemedicine application and verified that I am speaking with the correct person using two identifiers.  Patient Location: Home  Provider Location: Office/Clinic  I discussed the limitations of evaluation and management by telemedicine. The patient expressed understanding and agreed to proceed.  Review of Systems     Cardiac Risk Factors include: advanced age (>94men, >81 women);family history of premature cardiovascular disease;hypertension     Objective:    Today's Vitals   11/09/22 1303  Weight: 115 lb (52.2 kg)  Height: 5' (1.524 m)  PainSc: 0-No [name]   Body mass index is 22.46 kg/m.     11/09/2022    1:06 PM 02/01/2022    6:52 AM 01/23/2022   10:05 AM 11/14/2021    8:48 AM 02/13/2018   10:52 AM  Advanced Directives  Does Patient Have a Medical Advance Directive? Yes Yes Yes Yes Yes  Type of Estate agent of [place];Living will  Healthcare Power of [place];Living will Healthcare Power of [place];Living will Healthcare Power of [place];Living will  Does patient want to make changes to medical advance directive?   No - Patient declined    Copy of Healthcare Power of Attorney in Chart? No - copy requested  No - copy requested No - copy requested     Current Medications (verified) Outpatient Encounter Medications as of 11/09/2022  Medication Sig   ALPRAZolam (XANAX) 0.5 MG tablet Take 1 tablet (0.5 mg total) by mouth 3 (three) times daily as needed for anxiety.   azelastine (ASTELIN) 0.1 % nasal spray Place 1-2 sprays into both nostrils daily as needed for allergies.   B Complex Vitamins (B COMPLEX PO) Take 1 mL by mouth daily.   Biotin 32440 MCG TABS Take 10,000 mcg by mouth daily.   Calcium Carbonate-Vitamin D (CALCIUM 600+D PO) Take  1 tablet by mouth daily.   carboxymethylcellulose (REFRESH TEARS) 0.5 % SOLN Place 1 drop into both eyes 3 (three) times daily.   cetirizine (ZYRTEC) 10 MG tablet Take 10 mg by mouth at bedtime.   cholecalciferol (VITAMIN D3) 25 MCG (1000 UNIT) tablet Take 1,000 Units by mouth daily.   Cyanocobalamin 1000 MCG/15ML LIQD Take by mouth.   diltiazem (CARDIZEM CD) 120 MG 24 hr capsule TAKE ONE CAPSULE BY MOUTH DAILY   EPINEPHrine (EPI-PEN) 0.3 mg/0.3 mL DEVI Inject 0.3 mg into the muscle as needed (anaphylaxis).   fish oil-omega-3 fatty acids 1000 MG capsule Take 1 g by mouth 2 (two) times daily.   fluticasone (FLONASE) 50 MCG/ACT nasal spray as needed.   folic acid (FOLVITE) 800 MCG tablet Take 800 mcg by mouth daily.   hydrocortisone 2.5 % cream as needed.   Multiple Vitamins-Minerals (QC [facility] VISION SUPPORT 2 PO) Take 1 tablet by mouth daily.   nitroGLYCERIN (NITROSTAT) 0.4 MG SL tablet Place 1 tablet (0.4 mg total) under the tongue every 5 (five) minutes as needed. May repeat 3 times   nystatin ointment (MYCOSTATIN) SMARTSIG:Sparingly Topical Twice Daily   Omega 3 1000 MG CAPS Take by mouth.   sodium chloride (OCEAN) 0.65 % SOLN nasal spray Place 1 spray into both nostrils as needed for congestion.   traZODone (DESYREL) 100 MG tablet Take 1 tablet (100 mg total) by mouth at bedtime.   TURMERIC PO Take 1 tablet by  mouth 2 (two) times daily.   UNABLE TO FIND Allergy shots weekly   White Petrolatum-Mineral Oil (REFRESH P.M. OP) Place 1 Application into both eyes once a week.   No facility-administered encounter medications on file as of 11/09/2022.    Allergies (verified) Codeine, Erythromycin, Other, Cefdinir, Gabapentin, and Levocetirizine   History: Past Medical History:  Diagnosis Date   Anxiety    Arthritis    Bilateral Thumbs   Coronary artery spasm (HCC)    Non obstructive CAD   Diverticulosis of colon    Fibromyalgia    Hx of colonic polyps    Lumbar back [name]    MVP  (mitral valve prolapse)    No antibiotics required for procedures   Osteoporosis    Palpitations    Pneumonia    twice years ago   PONV (postoperative nausea and vomiting)    Shingles    Past Surgical History:  Procedure Laterality Date   CARDIAC CATHETERIZATION     In 1980s   CATARACT EXTRACTION, BILATERAL     CATARACT EXTRACTION, BILATERAL  05/2018   CHOLECYSTECTOMY  03/2003   COLONOSCOPY     LUMBAR LAMINECTOMY     NASAL SEPTOPLASTY W/ TURBINOPLASTY Bilateral 02/01/2022   Procedure: NASAL SEPTOPLASTY AND INFERIOR TURBINATE REDUCTION;  Surgeon: Osborn Coho, MD;  [facility];  Service: ENT;  Laterality: Bilateral;   SINUS ENDO WITH FUSION Right 02/01/2022   Procedure: ENDOSCOPIC RESECTION OF RIGHT CONCHA BULLOSA WITH FUSION;  Surgeon: Osborn Coho, MD;  Location: [facility] OR;  Service: ENT;  Laterality: Right;   TONSILLECTOMY     as a child   Family History  Problem Relation Age of Onset   Rheum arthritis Mother    Stroke Mother    Hypertension Mother    Heart failure Father    Heart disease Father    Asthma Maternal Grandfather    Allergies Maternal Grandfather    COPD Maternal Aunt         2 aunts   Hypertension Brother    Social History   Socioeconomic History   Marital status: Widowed    Spouse name: Not on file   Number of children: 1   Years of education: Not on file   Highest education level: Not on file  Occupational History   Occupation: Systems analyst: RETIRED    Comment: [employer]    Comment: taught preschool, Diplomatic Services operational officer  Tobacco Use   Smoking status: Former    Current packs/day: 0.00    Average packs/day: 0.5 packs/day for 15.0 years (7.5 ttl pk-yrs)    Types: Cigarettes    Start date: 05/02/1963    Quit date: 05/01/1978    Years since quitting: 44.5   Smokeless tobacco: Never  Vaping Use   Vaping status: Never Used  Substance and Sexual Activity   Alcohol use: Yes    Alcohol/week: 9.0 standard drinks of alcohol    Types:  9 Glasses of wine per week   Drug use: No   Sexual activity: Not Currently    Partners: Male    Birth control/protection: Post-menopausal    Comment: older than 16, less than 5  Other Topics Concern   Not on file  Social History Narrative   Married to Brandon Melnick   1 daughter with ??FM, neck [name]   Social Determinants of Health   Financial Resource Strain: Low Risk  (11/09/2022)   Overall Financial Resource Strain (CARDIA)    Difficulty of Paying Living  Expenses: Not hard at all  Food Insecurity: No Food Insecurity (11/09/2022)   Hunger Vital Sign    Worried About Running Out of Food in the Last Year: Never true    Ran Out of Food in the Last Year: Never true  Transportation Needs: No Transportation Needs (11/09/2022)   PRAPARE - Administrator, Civil Service (Medical): No    Lack of Transportation (Non-Medical): No  Physical Activity: Sufficiently Active (11/09/2022)   Exercise Vital Sign    Days of Exercise per Week: 3 days    Minutes of Exercise per Session: 60 min  Stress: No Stress Concern Present (11/09/2022)   [employer] of Occupational Health - Occupational Stress Questionnaire    Feeling of Stress : Not at all  Social Connections: Socially Integrated (11/09/2022)   Social Connection and Isolation Panel [NHANES]    Frequency of Communication with Friends and Family: More than three times a week    Frequency of Social Gatherings with Friends and Family: More than three times a week    Attends Religious Services: More than 4 times per year    Active Member of [employer] or Organizations: Yes    Attends Engineer, structural: More than 4 times per year    Marital Status: Married    Tobacco Counseling Counseling given: Not Answered   Clinical Intake:  Pre-visit preparation completed: Yes  [name] : No/denies [name] [name] Score: 0-No [name]     BMI - recorded: 22.46 Nutritional Status: BMI of 19-24  Normal Nutritional Risks: None Diabetes: No  How  often do you need to have someone help you when you read instructions, pamphlets, or other written materials from your doctor or pharmacy?: 1 - Never  Interpreter Needed?: No  Information entered by :: Timesha Cervantez N. Maelin Kurkowski, LPN.   Activities of Daily Living    11/09/2022    1:07 PM 01/23/2022   10:08 AM  In your present state of health, do you have any difficulty performing the following activities:  Hearing? 1   Comment hearing aids   Vision? 0   Difficulty concentrating or making decisions? 0   Comment Brain Stimulating Exercises, keep a daily list   Walking or climbing stairs? 0   Dressing or bathing? 0   Doing errands, shopping? 0 0  Preparing Food and eating ? N   Using the Toilet? N   In the past six months, have you accidently leaked urine? N   Do you have problems with loss of bowel control? N   Managing your Medications? N   Managing your Finances? N   Housekeeping or managing your Housekeeping? N     Patient Care Team: Myrlene Broker, MD as PCP - General (Internal Medicine) Rollene Rotunda, MD as PCP - Cardiology (Cardiology) Antony Contras, MD as Consulting Physician (Ophthalmology)  Indicate any recent Medical Services you may have received from other than [facility] providers in the past year (date may be approximate).     Assessment:   This is a routine wellness examination for Gwynn.  Hearing/Vision screen Hearing Screening - Comments:: Patient wears hearing aids. Vision Screening - Comments:: Wears rx glasses - up to date with routine eye exams with Antony Contras, MD.   Dietary issues and exercise activities discussed:     Goals Addressed             This Visit's Progress    My healthcare goal for 2024 is to maintain my current health status by continuing  to eat healthy, stay independent, physically and socially active.        Depression Screen    11/09/2022    1:12 PM 04/03/2022    3:04 PM 11/14/2021    8:49 AM 11/07/2021    8:08 AM 11/03/2020     8:41 AM 10/21/2019    9:48 AM 10/14/2018    8:06 AM  PHQ 2/9 Scores  PHQ - 2 Score 0 0 0 0 0 0 1  PHQ- 9 Score 0 0  0       Fall Risk    11/09/2022    1:05 PM 04/03/2022    3:04 PM 11/14/2021    8:48 AM 11/07/2021    8:08 AM 11/03/2020    8:40 AM  Fall Risk   Falls in the past year? 0 0 1 1 0  Comment   shoe got caught    Number falls in past yr: 0 0 0 0 0  Injury with Fall? 0 0 0 0 0  Risk for fall due to : No Fall Risks  Medication side effect    Follow up Falls prevention discussed Falls evaluation completed Falls evaluation completed;Education provided;Falls prevention discussed      MEDICARE RISK AT HOME:  Medicare Risk at Home - 11/09/22 1305     Any stairs in or around the home? Yes   stairlift   If so, are there any without handrails? No    Home free of loose throw rugs in walkways, pet beds, electrical cords, etc? Yes    Adequate lighting in your home to reduce risk of falls? Yes    Life alert? Yes    Use of a cane, walker or w/c? No    Grab bars in the bathroom? Yes    Shower chair or bench in shower? Yes    Elevated toilet seat or a handicapped toilet? Yes             TIMED UP AND GO:  Was the test performed?  No    Cognitive Function:        11/09/2022    1:09 PM 11/14/2021    8:50 AM  6CIT Screen  What Year? 0 points 0 points  What month? 0 points 0 points  What time? 0 points 0 points  Count back from 20 0 points 0 points  Months in reverse 0 points 0 points  Repeat phrase 0 points 0 points  Total Score 0 points 0 points    Immunizations Immunization History  Administered Date(s) Administered   Influenza Split 01/30/2011   Influenza Whole 01/29/2009, 01/29/2010, 01/10/2012, 02/09/2014   Influenza, High Dose Seasonal PF 01/23/2022   Influenza,inj,Quad PF,6+ Mos 01/24/2013, 01/31/2016   Influenza-Unspecified 01/26/2015, 01/04/2018   PFIZER(Purple Top)SARS-COV-2 Vaccination 05/23/2019, 06/13/2019, 02/28/2020   Pneumococcal Conjugate-13  04/17/2014   Pneumococcal Polysaccharide-23 05/02/2003   Td 07/19/2009   Tdap 05/24/2018   Zoster Recombinant(Shingrix) 08/16/2016, 12/15/2016   Zoster, Live 07/31/2012    TDAP status: Up to date  Flu Vaccine status: Up to date  Pneumococcal vaccine status: Up to date  Covid-19 vaccine status: Completed vaccines  Qualifies for Shingles Vaccine? Yes   Zostavax completed Yes   Shingrix Completed?: Yes  Screening Tests Health Maintenance  Topic Date Due   COVID-19 Vaccine (4 - 2023-24 season) 12/30/2021   INFLUENZA VACCINE  11/30/2022   Medicare Annual Wellness (AWV)  11/09/2023   DTaP/Tdap/Td (3 - Td or Tdap) 05/24/2028   Pneumonia Vaccine 46+ Years old  Completed   DEXA SCAN  Completed   Zoster Vaccines- Shingrix  Completed   HPV VACCINES  Aged Out    Health Maintenance  Health Maintenance Due  Topic Date Due   COVID-19 Vaccine (4 - 2023-24 season) 12/30/2021    Colorectal cancer screening: No longer required.   Mammogram status: Completed 07/04/2022. Repeat every year  Bone Density status: Completed 03/02/2021. Results reflect: Bone density results: OSTEOPENIA. Repeat every 2-3 years.  Lung Cancer Screening: (Low Dose CT Chest recommended if Age 62-80 years, 20 pack-year currently smoking OR have quit w/in 15years.) does not qualify.   Lung Cancer Screening Referral: no  Additional Screening:  Hepatitis C Screening: does not qualify; Completed no  Vision Screening: Recommended annual ophthalmology exams for early detection of glaucoma and other disorders of the eye. Is the patient up to date with their annual eye exam?  Yes  Who is the provider or what is the name of the office in which the patient attends annual eye exams? Antony Contras, MD. If pt is not established with a provider, would they like to be referred to a provider to establish care? No .   Dental Screening: Recommended annual dental exams for proper oral hygiene  Diabetic Foot Exam:  N/A  Community Resource Referral / Chronic Care Management: CRR required this visit?  No   CCM required this visit?  No     Plan:     I have personally reviewed and noted the following in the patient's chart:   Medical and social history Use of alcohol, tobacco or illicit drugs  Current medications and supplements including opioid prescriptions. Patient is not currently taking opioid prescriptions. Functional ability and status Nutritional status Physical activity Advanced directives List of other physicians Hospitalizations, surgeries, and ER visits in previous 12 months Vitals Screenings to include cognitive, depression, and falls Referrals and appointments  In addition, I have reviewed and discussed with patient certain preventive protocols, quality metrics, and best practice recommendations. A written personalized care plan for preventive services as well as general preventive health recommendations were provided to patient.     Mickeal Needy, LPN   07/06/6576   After Visit Summary: (Mail) Due to this being a telephonic visit, the after visit summary with patients personalized plan was offered to patient via mail   Nurse Notes: Normal cognitive status assessed by direct observation via telephone conversation by this Nurse Health Advisor. No abnormalities found.

## 2022-11-09 NOTE — Patient Instructions (Addendum)
[name] , Thank you for taking time to come for your Medicare Wellness Visit. I appreciate your ongoing commitment to your health goals. Please review the following plan we discussed and let me know if I can assist you in the future.   These are the goals we discussed:  Goals      My healthcare goal for 2024 is to maintain my current health status by continuing to eat healthy, stay independent, physically and socially active.        This is a list of the screening recommended for you and due dates:  Health Maintenance  Topic Date Due   COVID-19 Vaccine (4 - 2023-24 season) 12/30/2021   Flu Shot  11/30/2022   Medicare Annual Wellness Visit  11/09/2023   DTaP/Tdap/Td vaccine (3 - Td or Tdap) 05/24/2028   Pneumonia Vaccine  Completed   DEXA scan (bone density measurement)  Completed   Zoster (Shingles) Vaccine  Completed   HPV Vaccine  Aged Out    Advanced directives: Yes  Conditions/risks identified: Yes  Next appointment: It was nice speaking with you today!  Please follow up in one year for your annual wellness visit via telephone call with Nurse Percell Miller on 11/12/2023 at 1:30 p.m.  If you need to cancel or reschedule please call [phone].  Preventive Care 64 Years and Older, Female Preventive care refers to lifestyle choices and visits with your health care provider that can promote health and wellness. What does preventive care include? A yearly physical exam. This is also called an annual well check. Dental exams once or twice a year. Routine eye exams. Ask your health care provider how often you should have your eyes checked. Personal lifestyle choices, including: Daily care of your teeth and gums. Regular physical activity. Eating a healthy diet. Avoiding tobacco and drug use. Limiting alcohol use. Practicing safe sex. Taking low-dose aspirin every day. Taking vitamin and mineral supplements as recommended by your health care provider. What happens during an  annual well check? The services and screenings done by your health care provider during your annual well check will depend on your age, overall health, lifestyle risk factors, and family history of disease. Counseling  Your health care provider may ask you questions about your: Alcohol use. Tobacco use. Drug use. Emotional well-being. Home and relationship well-being. Sexual activity. Eating habits. History of falls. Memory and ability to understand (cognition). Work and work Astronomer. Reproductive health. Screening  You may have the following tests or measurements: Height, weight, and BMI. Blood pressure. Lipid and cholesterol levels. These may be checked every 5 years, or more frequently if you are over 59 years old. Skin check. Lung cancer screening. You may have this screening every year starting at age 85 if you have a 30-pack-year history of smoking and currently smoke or have quit within the past 15 years. Fecal occult blood test (FOBT) of the stool. You may have this test every year starting at age 75. Flexible sigmoidoscopy or colonoscopy. You may have a sigmoidoscopy every 5 years or a colonoscopy every 10 years starting at age 54. Hepatitis C blood test. Hepatitis B blood test. Sexually transmitted disease (STD) testing. Diabetes screening. This is done by checking your blood sugar (glucose) after you have not eaten for a while (fasting). You may have this done every 1-3 years. Bone density scan. This is done to screen for osteoporosis. You may have this done starting at age 85. Mammogram. This may be done every 1-2 years.  Talk to your health care provider about how often you should have regular mammograms. Talk with your health care provider about your test results, treatment options, and if necessary, the need for more tests. Vaccines  Your health care provider may recommend certain vaccines, such as: Influenza vaccine. This is recommended every year. Tetanus,  diphtheria, and acellular pertussis (Tdap, Td) vaccine. You may need a Td booster every 10 years. Zoster vaccine. You may need this after age 85. Pneumococcal 13-valent conjugate (PCV13) vaccine. One dose is recommended after age 85. Pneumococcal polysaccharide (PPSV23) vaccine. One dose is recommended after age 85. Talk to your health care provider about which screenings and vaccines you need and how often you need them. This information is not intended to replace advice given to you by your health care provider. Make sure you discuss any questions you have with your health care provider. Document Released: 05/14/2015 Document Revised: 01/05/2016 Document Reviewed: 02/16/2015 Elsevier Interactive Patient Education  2017 [employer].  Fall Prevention in the Home Falls can cause injuries. They can happen to people of all ages. There are many things you can do to make your home safe and to help prevent falls. What can I do on the outside of my home? Regularly fix the edges of walkways and driveways and fix any cracks. Remove anything that might make you trip as you walk through a door, such as a raised step or threshold. Trim any bushes or trees on the path to your home. Use bright outdoor lighting. Clear any walking paths of anything that might make someone trip, such as rocks or tools. Regularly check to see if handrails are loose or broken. Make sure that both sides of any steps have handrails. Any raised decks and porches should have guardrails on the edges. Have any leaves, snow, or ice cleared regularly. Use sand or salt on walking paths during winter. Clean up any spills in your garage right away. This includes oil or grease spills. What can I do in the bathroom? Use night lights. Install grab bars by the toilet and in the tub and shower. Do not use towel bars as grab bars. Use non-skid mats or decals in the tub or shower. If you need to sit down in the shower, use a plastic,  non-slip stool. Keep the floor dry. Clean up any water that spills on the floor as soon as it happens. Remove soap buildup in the tub or shower regularly. Attach bath mats securely with double-sided non-slip rug tape. Do not have throw rugs and other things on the floor that can make you trip. What can I do in the bedroom? Use night lights. Make sure that you have a light by your bed that is easy to reach. Do not use any sheets or blankets that are too big for your bed. They should not hang down onto the floor. Have a firm chair that has side arms. You can use this for support while you get dressed. Do not have throw rugs and other things on the floor that can make you trip. What can I do in the kitchen? Clean up any spills right away. Avoid walking on wet floors. Keep items that you use a lot in easy-to-reach places. If you need to reach something above you, use a strong step stool that has a grab bar. Keep electrical cords out of the way. Do not use floor polish or wax that makes floors slippery. If you must use wax, use non-skid floor wax.  Do not have throw rugs and other things on the floor that can make you trip. What can I do with my stairs? Do not leave any items on the stairs. Make sure that there are handrails on both sides of the stairs and use them. Fix handrails that are broken or loose. Make sure that handrails are as long as the stairways. Check any carpeting to make sure that it is firmly attached to the stairs. Fix any carpet that is loose or worn. Avoid having throw rugs at the top or bottom of the stairs. If you do have throw rugs, attach them to the floor with carpet tape. Make sure that you have a light switch at the top of the stairs and the bottom of the stairs. If you do not have them, ask someone to add them for you. What else can I do to help prevent falls? Wear shoes that: Do not have high heels. Have rubber bottoms. Are comfortable and fit you well. Are closed  at the toe. Do not wear sandals. If you use a stepladder: Make sure that it is fully opened. Do not climb a closed stepladder. Make sure that both sides of the stepladder are locked into place. Ask someone to hold it for you, if possible. Clearly mark and make sure that you can see: Any grab bars or handrails. First and last steps. Where the edge of each step is. Use tools that help you move around (mobility aids) if they are needed. These include: Canes. Walkers. Scooters. Crutches. Turn on the lights when you go into a dark area. Replace any light bulbs as soon as they burn out. Set up your furniture so you have a clear path. Avoid moving your furniture around. If any of your floors are uneven, fix them. If there are any pets around you, be aware of where they are. Review your medicines with your doctor. Some medicines can make you feel dizzy. This can increase your chance of falling. Ask your doctor what other things that you can do to help prevent falls. This information is not intended to replace advice given to you by your health care provider. Make sure you discuss any questions you have with your health care provider. Document Released: 02/11/2009 Document Revised: 09/23/2015 Document Reviewed: 05/22/2014 Elsevier Interactive Patient Education  2017 [employer].

## 2022-11-09 NOTE — Telephone Encounter (Signed)
Requesting refill on Trazodone to be sent to [facility].

## 2022-11-10 NOTE — Telephone Encounter (Signed)
Sent in for the patient

## 2022-11-13 ENCOUNTER — Ambulatory Visit (INDEPENDENT_AMBULATORY_CARE_PROVIDER_SITE_OTHER): Payer: Medicare Other | Admitting: Internal Medicine

## 2022-11-13 ENCOUNTER — Encounter: Payer: Self-pay | Admitting: Internal Medicine

## 2022-11-13 VITALS — BP 130/80 | HR 93 | Temp 97.9°F | Ht 60.0 in | Wt 114.0 lb

## 2022-11-13 DIAGNOSIS — F5101 Primary insomnia: Secondary | ICD-10-CM | POA: Diagnosis not present

## 2022-11-13 DIAGNOSIS — I10 Essential (primary) hypertension: Secondary | ICD-10-CM

## 2022-11-13 DIAGNOSIS — F411 Generalized anxiety disorder: Secondary | ICD-10-CM

## 2022-11-13 LAB — COMPREHENSIVE METABOLIC PANEL
ALT: 25 U/L (ref 0–35)
AST: 29 U/L (ref 0–37)
Albumin: 4.4 g/dL (ref 3.5–5.2)
Alkaline Phosphatase: 109 U/L (ref 39–117)
BUN: 13 mg/dL (ref 6–23)
CO2: 28 mEq/L (ref 19–32)
Calcium: 10 mg/dL (ref 8.4–10.5)
Chloride: 99 mEq/L (ref 96–112)
Creatinine, Ser: 0.77 mg/dL (ref 0.40–1.20)
GFR: 70.31 mL/min (ref >60.00)
Glucose, Bld: 96 mg/dL (ref 70–99)
Potassium: 4.5 mEq/L (ref 3.5–5.1)
Sodium: 136 mEq/L (ref 135–145)
Total Bilirubin: 0.5 mg/dL (ref 0.2–1.2)
Total Protein: 7.4 g/dL (ref 6.0–8.3)

## 2022-11-13 LAB — CBC
HCT: 45.2 % (ref 36.0–46.0)
Hemoglobin: 15.3 g/dL — ABNORMAL HIGH (ref 12.0–15.0)
MCHC: 33.9 g/dL (ref 30.0–36.0)
MCV: 101.6 fl — ABNORMAL HIGH (ref 78.0–100.0)
Platelets: 259 10*3/uL (ref 150.0–400.0)
RBC: 4.44 Mil/uL (ref 3.87–5.11)
RDW: 12.5 % (ref 11.5–15.5)
WBC: 7.3 10*3/uL (ref 4.0–10.5)

## 2022-11-13 LAB — LIPID PANEL
Cholesterol: 194 mg/dL (ref 0–200)
HDL: 89.1 mg/dL (ref >39.00)
LDL Cholesterol: 80 mg/dL (ref 0–99)
NonHDL: 104.66
Total CHOL/HDL Ratio: 2
Triglycerides: 124 mg/dL (ref 0.0–149.0)
VLDL: 24.8 mg/dL (ref 0.0–40.0)

## 2022-11-13 MED ORDER — TRAZODONE HCL 100 MG PO TABS: 100.0000 mg | ORAL_TABLET | Freq: Every day | ORAL | 3 refills | Status: DC

## 2022-11-13 MED ORDER — ALPRAZOLAM 0.5 MG PO TABS: 0.5000 mg | ORAL_TABLET | Freq: Three times a day (TID) | ORAL | 5 refills | Status: DC | PRN

## 2022-11-13 NOTE — Progress Notes (Signed)
   Subjective:   Patient ID: [name] [name], female    DOB: 01-30-1936, 87 y.o.   MRN: [id number]  HPI The patient is an 87 YO female coming in for follow up.  Review of Systems  Constitutional: Negative.   HENT: Negative.    Eyes: Negative.   Respiratory:  Negative for cough, chest tightness and shortness of breath.   Cardiovascular:  Negative for chest pain, palpitations and leg swelling.  Gastrointestinal:  Negative for abdominal distention, abdominal pain, constipation, diarrhea, nausea and vomiting.  Musculoskeletal: Negative.   Skin: Negative.   Neurological: Negative.   Psychiatric/Behavioral: Negative.      Objective:  Physical Exam Constitutional:      Appearance: She is well-developed.  HENT:     Head: Normocephalic and atraumatic.  Cardiovascular:     Rate and Rhythm: Normal rate and regular rhythm.  Pulmonary:     Effort: Pulmonary effort is normal. No respiratory distress.     Breath sounds: Normal breath sounds. No wheezing or rales.  Abdominal:     General: Bowel sounds are normal. There is no distension.     Palpations: Abdomen is soft.     Tenderness: There is no abdominal tenderness. There is no rebound.  Musculoskeletal:     Cervical back: Normal range of motion.  Skin:    General: Skin is warm and dry.  Neurological:     Mental Status: She is alert and oriented to person, place, and time.     Coordination: Coordination normal.     Vitals:   11/13/22 0958  BP: 130/80  Pulse: 93  Temp: 97.9 F (36.6 C)  TempSrc: Oral  SpO2: 96%  Weight: 114 lb (51.7 kg)  Height: 5' (1.524 m)    Assessment & Plan:

## 2022-11-13 NOTE — Patient Instructions (Signed)
Think if you want to try minoxidil cream to use on the scalp.

## 2022-11-15 DIAGNOSIS — J301 Allergic rhinitis due to pollen: Secondary | ICD-10-CM | POA: Diagnosis not present

## 2022-11-15 DIAGNOSIS — J3089 Other allergic rhinitis: Secondary | ICD-10-CM | POA: Diagnosis not present

## 2022-11-16 DIAGNOSIS — J3089 Other allergic rhinitis: Secondary | ICD-10-CM | POA: Diagnosis not present

## 2022-11-16 DIAGNOSIS — J3081 Allergic rhinitis due to animal (cat) (dog) hair and dander: Secondary | ICD-10-CM | POA: Diagnosis not present

## 2022-11-16 DIAGNOSIS — J301 Allergic rhinitis due to pollen: Secondary | ICD-10-CM | POA: Diagnosis not present

## 2022-11-17 NOTE — Assessment & Plan Note (Signed)
Uses alprazolam 0.5 mg TID prn and trazodone 100 mg at bedtime and overall stable.

## 2022-11-17 NOTE — Assessment & Plan Note (Signed)
Checking CMP and CBC and lipid panel and adjust diltiazem 120 mg daily as needed. BP at goal. Continue.

## 2022-11-17 NOTE — Assessment & Plan Note (Signed)
Using trazodone 100 mg at bedtime and working well. Continue.

## 2022-11-30 DIAGNOSIS — J3081 Allergic rhinitis due to animal (cat) (dog) hair and dander: Secondary | ICD-10-CM | POA: Diagnosis not present

## 2022-11-30 DIAGNOSIS — J3089 Other allergic rhinitis: Secondary | ICD-10-CM | POA: Diagnosis not present

## 2022-11-30 DIAGNOSIS — J301 Allergic rhinitis due to pollen: Secondary | ICD-10-CM | POA: Diagnosis not present

## 2022-12-04 ENCOUNTER — Ambulatory Visit: Payer: Medicare Other | Admitting: Obstetrics & Gynecology

## 2022-12-06 DIAGNOSIS — J3081 Allergic rhinitis due to animal (cat) (dog) hair and dander: Secondary | ICD-10-CM | POA: Diagnosis not present

## 2022-12-06 DIAGNOSIS — J301 Allergic rhinitis due to pollen: Secondary | ICD-10-CM | POA: Diagnosis not present

## 2022-12-06 DIAGNOSIS — J3089 Other allergic rhinitis: Secondary | ICD-10-CM | POA: Diagnosis not present

## 2022-12-12 DIAGNOSIS — L570 Actinic keratosis: Secondary | ICD-10-CM | POA: Diagnosis not present

## 2022-12-12 DIAGNOSIS — D0462 Carcinoma in situ of skin of left upper limb, including shoulder: Secondary | ICD-10-CM | POA: Diagnosis not present

## 2022-12-12 DIAGNOSIS — L603 Nail dystrophy: Secondary | ICD-10-CM | POA: Diagnosis not present

## 2022-12-14 DIAGNOSIS — J3081 Allergic rhinitis due to animal (cat) (dog) hair and dander: Secondary | ICD-10-CM | POA: Diagnosis not present

## 2022-12-14 DIAGNOSIS — J3089 Other allergic rhinitis: Secondary | ICD-10-CM | POA: Diagnosis not present

## 2022-12-14 DIAGNOSIS — J301 Allergic rhinitis due to pollen: Secondary | ICD-10-CM | POA: Diagnosis not present

## 2022-12-21 DIAGNOSIS — J3089 Other allergic rhinitis: Secondary | ICD-10-CM | POA: Diagnosis not present

## 2022-12-21 DIAGNOSIS — J301 Allergic rhinitis due to pollen: Secondary | ICD-10-CM | POA: Diagnosis not present

## 2022-12-25 ENCOUNTER — Ambulatory Visit (INDEPENDENT_AMBULATORY_CARE_PROVIDER_SITE_OTHER): Payer: Medicare Other | Admitting: Podiatry

## 2022-12-25 DIAGNOSIS — B351 Tinea unguium: Secondary | ICD-10-CM | POA: Diagnosis not present

## 2022-12-25 NOTE — Progress Notes (Unsigned)
Subjective:   Patient ID: [name] [name], female   DOB: 87 y.o.   MRN: [id number]   HPI Chief Complaint  Patient presents with   Nail Problem   She has been using vicks on the nail over the last 2 weeks and it has been getting better. Her nail is not growing. No pains.   Wine at night  ROS      Objective:  Physical Exam  ***     Assessment:  ***     Plan:  ***

## 2022-12-25 NOTE — Patient Instructions (Signed)
I have ordered a medication for you that will come from [facility] in [place]. They should be calling you to verify insurance and will mail the medication to you. If you live close by then you can go by their pharmacy to pick up the medication. Their phone number is [phone]. If you do not hear from them in the next few days, please give us a call at [phone].   

## 2022-12-28 DIAGNOSIS — J3089 Other allergic rhinitis: Secondary | ICD-10-CM | POA: Diagnosis not present

## 2022-12-28 DIAGNOSIS — J301 Allergic rhinitis due to pollen: Secondary | ICD-10-CM | POA: Diagnosis not present

## 2023-01-04 DIAGNOSIS — J3081 Allergic rhinitis due to animal (cat) (dog) hair and dander: Secondary | ICD-10-CM | POA: Diagnosis not present

## 2023-01-04 DIAGNOSIS — J3089 Other allergic rhinitis: Secondary | ICD-10-CM | POA: Diagnosis not present

## 2023-01-04 DIAGNOSIS — J301 Allergic rhinitis due to pollen: Secondary | ICD-10-CM | POA: Diagnosis not present

## 2023-01-11 DIAGNOSIS — J3089 Other allergic rhinitis: Secondary | ICD-10-CM | POA: Diagnosis not present

## 2023-01-11 DIAGNOSIS — J3081 Allergic rhinitis due to animal (cat) (dog) hair and dander: Secondary | ICD-10-CM | POA: Diagnosis not present

## 2023-01-11 DIAGNOSIS — J301 Allergic rhinitis due to pollen: Secondary | ICD-10-CM | POA: Diagnosis not present

## 2023-01-18 DIAGNOSIS — J3089 Other allergic rhinitis: Secondary | ICD-10-CM | POA: Diagnosis not present

## 2023-01-18 DIAGNOSIS — J3081 Allergic rhinitis due to animal (cat) (dog) hair and dander: Secondary | ICD-10-CM | POA: Diagnosis not present

## 2023-01-18 DIAGNOSIS — J301 Allergic rhinitis due to pollen: Secondary | ICD-10-CM | POA: Diagnosis not present

## 2023-01-25 ENCOUNTER — Ambulatory Visit: Payer: Medicare Other | Admitting: Obstetrics and Gynecology

## 2023-01-25 ENCOUNTER — Encounter: Payer: Self-pay | Admitting: Obstetrics and Gynecology

## 2023-01-25 VITALS — BP 120/78 | HR 87 | Ht 62.25 in | Wt 118.0 lb

## 2023-01-25 DIAGNOSIS — J301 Allergic rhinitis due to pollen: Secondary | ICD-10-CM | POA: Diagnosis not present

## 2023-01-25 DIAGNOSIS — B009 Herpesviral infection, unspecified: Secondary | ICD-10-CM

## 2023-01-25 DIAGNOSIS — Z9189 Other specified personal risk factors, not elsewhere classified: Secondary | ICD-10-CM

## 2023-01-25 DIAGNOSIS — Z9289 Personal history of other medical treatment: Secondary | ICD-10-CM

## 2023-01-25 DIAGNOSIS — Z01419 Encounter for gynecological examination (general) (routine) without abnormal findings: Secondary | ICD-10-CM

## 2023-01-25 DIAGNOSIS — J3081 Allergic rhinitis due to animal (cat) (dog) hair and dander: Secondary | ICD-10-CM | POA: Diagnosis not present

## 2023-01-25 DIAGNOSIS — M800B2A Age-related osteoporosis with current pathological fracture, left pelvis, initial encounter for fracture: Secondary | ICD-10-CM

## 2023-01-25 DIAGNOSIS — N642 Atrophy of breast: Secondary | ICD-10-CM

## 2023-01-25 DIAGNOSIS — J3089 Other allergic rhinitis: Secondary | ICD-10-CM | POA: Diagnosis not present

## 2023-01-25 DIAGNOSIS — M810 Age-related osteoporosis without current pathological fracture: Secondary | ICD-10-CM | POA: Diagnosis not present

## 2023-01-25 NOTE — Progress Notes (Signed)
87 y.o. y.o. female here for annual exam.   History:    87 y.o. G36P1L1 Widowed   RP:  Established patient presenting for annual gyn exam   HPI: Postmenopausal, well on no hormone replacement therapy.  No postmenopausal bleeding.  No pelvic pain.  Abstinent.  Pap Neg 10/2018.  No indication to repeat a Pap at this time.  Urine and bowel movements normal.  Colono 06/2007.  Breasts normal. Mammo Neg 06/2021. Body mass index 21.05. Walking regularly.  BD 03/2021 Osteopenia with frax of 9.8% and 2.5%.  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 Health labs with family physician.  On wait list to move into a retirement home  Pelvic discharge: denies Pelvic pain: denies Vaginal bleeding: denies   Height 5' 2.25" (1.581 m), weight 118 lb (53.5 kg).  No results found for: "DIAGPAP", "HPVHIGH", "ADEQPAP"  GYN HISTORY: No results found for: "DIAGPAP", "HPVHIGH", "ADEQPAP"  OB History  Gravida Para Term Preterm AB Living  1 1 1    0 1  SAB IAB Ectopic Multiple Live Births               # Outcome Date GA Lbr Len/2nd Weight Sex Type Anes PTL Lv  1 Term             Past Medical History:  Diagnosis Date   Anxiety    Arthritis    Bilateral Thumbs   Coronary artery spasm (HCC)    Non obstructive CAD   Diverticulosis of colon    Fibromyalgia    Hx of colonic polyps    Lumbar back pain    MVP (mitral valve prolapse)    No antibiotics required for procedures   Osteoporosis    Palpitations    Pneumonia    twice years ago   PONV (postoperative nausea and vomiting)    Shingles     Past Surgical History:  Procedure Laterality Date   CARDIAC CATHETERIZATION     In 1980s   CATARACT EXTRACTION, BILATERAL     CATARACT EXTRACTION, BILATERAL  05/2018   CHOLECYSTECTOMY  03/2003   COLONOSCOPY     LUMBAR LAMINECTOMY     NASAL SEPTOPLASTY W/ TURBINOPLASTY Bilateral 02/01/2022   Procedure: NASAL SEPTOPLASTY AND INFERIOR TURBINATE REDUCTION;  Surgeon: Osborn Coho, MD;  Location: [facility] OR;   Service: ENT;  Laterality: Bilateral;   SINUS ENDO WITH FUSION Right 02/01/2022   Procedure: ENDOSCOPIC RESECTION OF RIGHT CONCHA BULLOSA WITH FUSION;  Surgeon: Osborn Coho, MD;  Location: [facility] OR;  Service: ENT;  Laterality: Right;   TONSILLECTOMY     as a child    Current Outpatient Medications on File Prior to Visit  Medication Sig Dispense Refill   ALPRAZolam (XANAX) 0.5 MG tablet Take 1 tablet (0.5 mg total) by mouth 3 (three) times daily as needed for anxiety. 70 tablet 5   azelastine (ASTELIN) 0.1 % nasal spray Place 1-2 sprays into both nostrils daily as needed for allergies.     B Complex Vitamins (B COMPLEX PO) Take 1 mL by mouth daily.     Biotin 52841 MCG TABS Take 10,000 mcg by mouth daily.     Calcium Carbonate-Vitamin D (CALCIUM 600+D PO) Take 1 tablet by mouth daily.     carboxymethylcellulose (REFRESH TEARS) 0.5 % SOLN Place 1 drop into both eyes 3 (three) times daily.     cetirizine (ZYRTEC) 10 MG tablet Take 10 mg by mouth at bedtime.     cholecalciferol (VITAMIN D3) 25 MCG (  1000 UNIT) tablet Take 1,000 Units by mouth daily.     Cyanocobalamin 1000 MCG/15ML LIQD Take by mouth.     diltiazem (CARDIZEM CD) 120 MG 24 hr capsule TAKE ONE CAPSULE BY MOUTH DAILY 90 capsule 2   EPINEPHrine (EPI-PEN) 0.3 mg/0.3 mL DEVI Inject 0.3 mg into the muscle as needed (anaphylaxis).     fish oil-omega-3 fatty acids 1000 MG capsule Take 1 g by mouth 2 (two) times daily.     fluticasone (FLONASE) 50 MCG/ACT nasal spray as needed.     folic acid (FOLVITE) 800 MCG tablet Take 800 mcg by mouth daily.     hydrocortisone 2.5 % cream as needed.     Multiple Vitamins-Minerals (QC [facility] VISION SUPPORT 2 PO) Take 1 tablet by mouth daily.     nitroGLYCERIN (NITROSTAT) 0.4 MG SL tablet Place 1 tablet (0.4 mg total) under the tongue every 5 (five) minutes as needed. May repeat 3 times 25 tablet 5   nystatin ointment (MYCOSTATIN) SMARTSIG:Sparingly Topical Twice Daily     Omega 3 1000 MG CAPS Take  by mouth.     Polyethyl Glycol-Propyl Glycol (SYSTANE OP) Apply to eye.     sodium chloride (OCEAN) 0.65 % SOLN nasal spray Place 1 spray into both nostrils as needed for congestion.     traZODone (DESYREL) 100 MG tablet Take 1 tablet (100 mg total) by mouth at bedtime. 90 tablet 3   TURMERIC PO Take 1 tablet by mouth 2 (two) times daily.     UNABLE TO FIND Allergy shots weekly     White Petrolatum-Mineral Oil (REFRESH P.M. OP) Place 1 Application into both eyes once a week.     No current facility-administered medications on file prior to visit.    Social History   Socioeconomic History   Marital status: Widowed    Spouse name: Not on file   Number of children: 1   Years of education: Not on file   Highest education level: Not on file  Occupational History   Occupation: Systems analyst: RETIRED    Comment: [employer]    Comment: taught preschool, Diplomatic Services operational officer  Tobacco Use   Smoking status: Former    Current packs/day: 0.00    Average packs/day: 0.5 packs/day for 15.0 years (7.5 ttl pk-yrs)    Types: Cigarettes    Start date: 05/02/1963    Quit date: 05/01/1978    Years since quitting: 44.7   Smokeless tobacco: Never  Vaping Use   Vaping status: Never Used  Substance and Sexual Activity   Alcohol use: Yes    Alcohol/week: 9.0 standard drinks of alcohol    Types: 9 Glasses of wine per week   Drug use: No   Sexual activity: Not Currently    Partners: Male    Birth control/protection: Post-menopausal    Comment: older than 16, less than 5  Other Topics Concern   Not on file  Social History Narrative   Married to Brandon Melnick   1 daughter with ??FM, neck pain   Social Determinants of Health   Financial Resource Strain: Low Risk  (11/09/2022)   Overall Financial Resource Strain (CARDIA)    Difficulty of Paying Living Expenses: Not hard at all  Food Insecurity: No Food Insecurity (11/09/2022)   Hunger Vital Sign    Worried About Running Out of Food in the Last  Year: Never true    Ran Out of Food in the Last Year: Never true  Transportation Needs:  No Transportation Needs (11/09/2022)   PRAPARE - Administrator, Civil Service (Medical): No    Lack of Transportation (Non-Medical): No  Physical Activity: Sufficiently Active (11/09/2022)   Exercise Vital Sign    Days of Exercise per Week: 3 days    Minutes of Exercise per Session: 60 min  Stress: No Stress Concern Present (11/09/2022)   [employer] of Occupational Health - Occupational Stress Questionnaire    Feeling of Stress : Not at all  Social Connections: Socially Integrated (11/09/2022)   Social Connection and Isolation Panel [NHANES]    Frequency of Communication with Friends and Family: More than three times a week    Frequency of Social Gatherings with Friends and Family: More than three times a week    Attends Religious Services: More than 4 times per year    Active Member of [employer] or Organizations: Yes    Attends Engineer, structural: More than 4 times per year    Marital Status: Married  Catering manager Violence: Not At Risk (11/09/2022)   Humiliation, Afraid, Rape, and Kick questionnaire    Fear of Current or Ex-Partner: No    Emotionally Abused: No    Physically Abused: No    Sexually Abused: No    Family History  Problem Relation Age of Onset   Rheum arthritis Mother    Stroke Mother    Hypertension Mother    Heart failure Father    Heart disease Father    Asthma Maternal Grandfather    Allergies Maternal Grandfather    COPD Maternal Aunt         2 aunts   Hypertension Brother      Allergies  Allergen Reactions   Codeine Nausea And Vomiting   Erythromycin Nausea And Vomiting   Other     Environmental allergies   Cefdinir Diarrhea and Nausea And Vomiting   Gabapentin Other (See Comments)    Head spinning, blurring vision   Levocetirizine Swelling    Feet swelling (tolerates zyrtec)      Patient's last menstrual period was No LMP  recorded. Patient is postmenopausal..           Exercising: twice a week at a gym   Review of Systems Alls systems reviewed and are negative.     PE General appearance: alert, cooperative and appears stated age Head: Normocephalic, without obvious abnormality, atraumatic Neck: no adenopathy, supple, symmetrical, trachea midline and thyroid normal to inspection and palpation Lungs: clear to auscultation bilaterally Breasts: normal appearance, no masses or tenderness Heart: regular rate and rhythm Abdomen: soft, non-tender; bowel sounds normal; no masses,  no organomegaly Extremities: extremities normal, atraumatic, no cyanosis or edema Skin: Skin color, texture, turgor normal. No rashes or lesions Lymph nodes: Cervical, supraclavicular, and axillary nodes normal. No abnormal inguinal nodes palpated Neurologic: Grossly normal     Pelvic: agreed to defer this year with no complaints          Chaperone was present for exam.   A:         Well Woman GYN exam                             P:                     Encouraged annual mammogram screening  Labs and immunizations with her primary             Discussed breast self exams             Encouraged safe sexual practices and enouraged healthy lifestyle practices with diet and exercise Bone scan referral placed for this year Earley Favor

## 2023-02-01 DIAGNOSIS — J3081 Allergic rhinitis due to animal (cat) (dog) hair and dander: Secondary | ICD-10-CM | POA: Diagnosis not present

## 2023-02-01 DIAGNOSIS — J3089 Other allergic rhinitis: Secondary | ICD-10-CM | POA: Diagnosis not present

## 2023-02-01 DIAGNOSIS — J301 Allergic rhinitis due to pollen: Secondary | ICD-10-CM | POA: Diagnosis not present

## 2023-02-08 DIAGNOSIS — J3089 Other allergic rhinitis: Secondary | ICD-10-CM | POA: Diagnosis not present

## 2023-02-08 DIAGNOSIS — J3081 Allergic rhinitis due to animal (cat) (dog) hair and dander: Secondary | ICD-10-CM | POA: Diagnosis not present

## 2023-02-08 DIAGNOSIS — J301 Allergic rhinitis due to pollen: Secondary | ICD-10-CM | POA: Diagnosis not present

## 2023-02-15 DIAGNOSIS — J3089 Other allergic rhinitis: Secondary | ICD-10-CM | POA: Diagnosis not present

## 2023-02-15 DIAGNOSIS — J301 Allergic rhinitis due to pollen: Secondary | ICD-10-CM | POA: Diagnosis not present

## 2023-02-15 DIAGNOSIS — J3081 Allergic rhinitis due to animal (cat) (dog) hair and dander: Secondary | ICD-10-CM | POA: Diagnosis not present

## 2023-02-22 DIAGNOSIS — J301 Allergic rhinitis due to pollen: Secondary | ICD-10-CM | POA: Diagnosis not present

## 2023-02-22 DIAGNOSIS — J3081 Allergic rhinitis due to animal (cat) (dog) hair and dander: Secondary | ICD-10-CM | POA: Diagnosis not present

## 2023-02-22 DIAGNOSIS — J3089 Other allergic rhinitis: Secondary | ICD-10-CM | POA: Diagnosis not present

## 2023-03-01 DIAGNOSIS — Z23 Encounter for immunization: Secondary | ICD-10-CM | POA: Diagnosis not present

## 2023-03-07 DIAGNOSIS — M65342 Trigger finger, left ring finger: Secondary | ICD-10-CM | POA: Diagnosis not present

## 2023-03-08 DIAGNOSIS — J3081 Allergic rhinitis due to animal (cat) (dog) hair and dander: Secondary | ICD-10-CM | POA: Diagnosis not present

## 2023-03-08 DIAGNOSIS — J3089 Other allergic rhinitis: Secondary | ICD-10-CM | POA: Diagnosis not present

## 2023-03-08 DIAGNOSIS — J301 Allergic rhinitis due to pollen: Secondary | ICD-10-CM | POA: Diagnosis not present

## 2023-03-15 DIAGNOSIS — J301 Allergic rhinitis due to pollen: Secondary | ICD-10-CM | POA: Diagnosis not present

## 2023-03-15 DIAGNOSIS — J3089 Other allergic rhinitis: Secondary | ICD-10-CM | POA: Diagnosis not present

## 2023-03-22 DIAGNOSIS — J301 Allergic rhinitis due to pollen: Secondary | ICD-10-CM | POA: Diagnosis not present

## 2023-03-22 DIAGNOSIS — J3081 Allergic rhinitis due to animal (cat) (dog) hair and dander: Secondary | ICD-10-CM | POA: Diagnosis not present

## 2023-03-22 DIAGNOSIS — J3089 Other allergic rhinitis: Secondary | ICD-10-CM | POA: Diagnosis not present

## 2023-04-04 DIAGNOSIS — M79644 Pain in right finger(s): Secondary | ICD-10-CM | POA: Diagnosis not present

## 2023-04-04 DIAGNOSIS — M65342 Trigger finger, left ring finger: Secondary | ICD-10-CM | POA: Diagnosis not present

## 2023-04-05 DIAGNOSIS — J301 Allergic rhinitis due to pollen: Secondary | ICD-10-CM | POA: Diagnosis not present

## 2023-04-05 DIAGNOSIS — J3089 Other allergic rhinitis: Secondary | ICD-10-CM | POA: Diagnosis not present

## 2023-04-05 DIAGNOSIS — J3081 Allergic rhinitis due to animal (cat) (dog) hair and dander: Secondary | ICD-10-CM | POA: Diagnosis not present

## 2023-04-19 DIAGNOSIS — J301 Allergic rhinitis due to pollen: Secondary | ICD-10-CM | POA: Diagnosis not present

## 2023-04-19 DIAGNOSIS — J3089 Other allergic rhinitis: Secondary | ICD-10-CM | POA: Diagnosis not present

## 2023-04-19 DIAGNOSIS — J3081 Allergic rhinitis due to animal (cat) (dog) hair and dander: Secondary | ICD-10-CM | POA: Diagnosis not present

## 2023-05-03 DIAGNOSIS — J3089 Other allergic rhinitis: Secondary | ICD-10-CM | POA: Diagnosis not present

## 2023-05-03 DIAGNOSIS — J301 Allergic rhinitis due to pollen: Secondary | ICD-10-CM | POA: Diagnosis not present

## 2023-05-15 ENCOUNTER — Other Ambulatory Visit: Payer: Self-pay | Admitting: Internal Medicine

## 2023-05-17 DIAGNOSIS — J301 Allergic rhinitis due to pollen: Secondary | ICD-10-CM | POA: Diagnosis not present

## 2023-05-17 DIAGNOSIS — J3081 Allergic rhinitis due to animal (cat) (dog) hair and dander: Secondary | ICD-10-CM | POA: Diagnosis not present

## 2023-05-17 DIAGNOSIS — J3089 Other allergic rhinitis: Secondary | ICD-10-CM | POA: Diagnosis not present

## 2023-05-24 DIAGNOSIS — J3089 Other allergic rhinitis: Secondary | ICD-10-CM | POA: Diagnosis not present

## 2023-05-24 DIAGNOSIS — J3081 Allergic rhinitis due to animal (cat) (dog) hair and dander: Secondary | ICD-10-CM | POA: Diagnosis not present

## 2023-05-24 DIAGNOSIS — J301 Allergic rhinitis due to pollen: Secondary | ICD-10-CM | POA: Diagnosis not present

## 2023-05-31 DIAGNOSIS — J301 Allergic rhinitis due to pollen: Secondary | ICD-10-CM | POA: Diagnosis not present

## 2023-05-31 DIAGNOSIS — J3081 Allergic rhinitis due to animal (cat) (dog) hair and dander: Secondary | ICD-10-CM | POA: Diagnosis not present

## 2023-05-31 DIAGNOSIS — J3089 Other allergic rhinitis: Secondary | ICD-10-CM | POA: Diagnosis not present

## 2023-06-04 ENCOUNTER — Ambulatory Visit (INDEPENDENT_AMBULATORY_CARE_PROVIDER_SITE_OTHER): Payer: Medicare Other | Admitting: Podiatry

## 2023-06-04 ENCOUNTER — Encounter: Payer: Self-pay | Admitting: Podiatry

## 2023-06-04 DIAGNOSIS — B351 Tinea unguium: Secondary | ICD-10-CM

## 2023-06-04 NOTE — Progress Notes (Unsigned)
Nail beter- trimmed

## 2023-06-07 DIAGNOSIS — J301 Allergic rhinitis due to pollen: Secondary | ICD-10-CM | POA: Diagnosis not present

## 2023-06-07 DIAGNOSIS — J3089 Other allergic rhinitis: Secondary | ICD-10-CM | POA: Diagnosis not present

## 2023-06-07 DIAGNOSIS — J3081 Allergic rhinitis due to animal (cat) (dog) hair and dander: Secondary | ICD-10-CM | POA: Diagnosis not present

## 2023-06-14 DIAGNOSIS — J3081 Allergic rhinitis due to animal (cat) (dog) hair and dander: Secondary | ICD-10-CM | POA: Diagnosis not present

## 2023-06-14 DIAGNOSIS — J301 Allergic rhinitis due to pollen: Secondary | ICD-10-CM | POA: Diagnosis not present

## 2023-06-14 DIAGNOSIS — J3089 Other allergic rhinitis: Secondary | ICD-10-CM | POA: Diagnosis not present

## 2023-06-28 DIAGNOSIS — J3081 Allergic rhinitis due to animal (cat) (dog) hair and dander: Secondary | ICD-10-CM | POA: Diagnosis not present

## 2023-06-28 DIAGNOSIS — J3089 Other allergic rhinitis: Secondary | ICD-10-CM | POA: Diagnosis not present

## 2023-06-28 DIAGNOSIS — J301 Allergic rhinitis due to pollen: Secondary | ICD-10-CM | POA: Diagnosis not present

## 2023-07-05 DIAGNOSIS — J301 Allergic rhinitis due to pollen: Secondary | ICD-10-CM | POA: Diagnosis not present

## 2023-07-05 DIAGNOSIS — J3089 Other allergic rhinitis: Secondary | ICD-10-CM | POA: Diagnosis not present

## 2023-07-05 DIAGNOSIS — J3081 Allergic rhinitis due to animal (cat) (dog) hair and dander: Secondary | ICD-10-CM | POA: Diagnosis not present

## 2023-07-10 DIAGNOSIS — Z1231 Encounter for screening mammogram for malignant neoplasm of breast: Secondary | ICD-10-CM | POA: Diagnosis not present

## 2023-07-10 LAB — HM MAMMOGRAPHY

## 2023-07-12 ENCOUNTER — Encounter: Payer: Self-pay | Admitting: Internal Medicine

## 2023-07-12 DIAGNOSIS — J301 Allergic rhinitis due to pollen: Secondary | ICD-10-CM | POA: Diagnosis not present

## 2023-07-12 DIAGNOSIS — J3089 Other allergic rhinitis: Secondary | ICD-10-CM | POA: Diagnosis not present

## 2023-07-12 DIAGNOSIS — J3081 Allergic rhinitis due to animal (cat) (dog) hair and dander: Secondary | ICD-10-CM | POA: Diagnosis not present

## 2023-07-13 DIAGNOSIS — J309 Allergic rhinitis, unspecified: Secondary | ICD-10-CM | POA: Diagnosis not present

## 2023-07-16 DIAGNOSIS — L738 Other specified follicular disorders: Secondary | ICD-10-CM | POA: Diagnosis not present

## 2023-07-16 DIAGNOSIS — Z85828 Personal history of other malignant neoplasm of skin: Secondary | ICD-10-CM | POA: Diagnosis not present

## 2023-07-16 DIAGNOSIS — L814 Other melanin hyperpigmentation: Secondary | ICD-10-CM | POA: Diagnosis not present

## 2023-07-16 DIAGNOSIS — L820 Inflamed seborrheic keratosis: Secondary | ICD-10-CM | POA: Diagnosis not present

## 2023-07-16 DIAGNOSIS — L309 Dermatitis, unspecified: Secondary | ICD-10-CM | POA: Diagnosis not present

## 2023-07-16 DIAGNOSIS — L0889 Other specified local infections of the skin and subcutaneous tissue: Secondary | ICD-10-CM | POA: Diagnosis not present

## 2023-07-16 DIAGNOSIS — D0462 Carcinoma in situ of skin of left upper limb, including shoulder: Secondary | ICD-10-CM | POA: Diagnosis not present

## 2023-07-16 DIAGNOSIS — L821 Other seborrheic keratosis: Secondary | ICD-10-CM | POA: Diagnosis not present

## 2023-07-16 DIAGNOSIS — D225 Melanocytic nevi of trunk: Secondary | ICD-10-CM | POA: Diagnosis not present

## 2023-07-16 DIAGNOSIS — C44729 Squamous cell carcinoma of skin of left lower limb, including hip: Secondary | ICD-10-CM | POA: Diagnosis not present

## 2023-07-18 DIAGNOSIS — J301 Allergic rhinitis due to pollen: Secondary | ICD-10-CM | POA: Diagnosis not present

## 2023-07-18 DIAGNOSIS — J3089 Other allergic rhinitis: Secondary | ICD-10-CM | POA: Diagnosis not present

## 2023-07-18 DIAGNOSIS — J3081 Allergic rhinitis due to animal (cat) (dog) hair and dander: Secondary | ICD-10-CM | POA: Diagnosis not present

## 2023-07-23 DIAGNOSIS — H04123 Dry eye syndrome of bilateral lacrimal glands: Secondary | ICD-10-CM | POA: Diagnosis not present

## 2023-07-23 DIAGNOSIS — Z961 Presence of intraocular lens: Secondary | ICD-10-CM | POA: Diagnosis not present

## 2023-07-23 DIAGNOSIS — H52203 Unspecified astigmatism, bilateral: Secondary | ICD-10-CM | POA: Diagnosis not present

## 2023-07-25 ENCOUNTER — Other Ambulatory Visit: Payer: Self-pay | Admitting: Cardiology

## 2023-07-26 DIAGNOSIS — J301 Allergic rhinitis due to pollen: Secondary | ICD-10-CM | POA: Diagnosis not present

## 2023-07-26 DIAGNOSIS — J3089 Other allergic rhinitis: Secondary | ICD-10-CM | POA: Diagnosis not present

## 2023-07-26 DIAGNOSIS — J3081 Allergic rhinitis due to animal (cat) (dog) hair and dander: Secondary | ICD-10-CM | POA: Diagnosis not present

## 2023-08-02 DIAGNOSIS — J3089 Other allergic rhinitis: Secondary | ICD-10-CM | POA: Diagnosis not present

## 2023-08-02 DIAGNOSIS — J301 Allergic rhinitis due to pollen: Secondary | ICD-10-CM | POA: Diagnosis not present

## 2023-08-02 DIAGNOSIS — J3081 Allergic rhinitis due to animal (cat) (dog) hair and dander: Secondary | ICD-10-CM | POA: Diagnosis not present

## 2023-08-09 DIAGNOSIS — J3089 Other allergic rhinitis: Secondary | ICD-10-CM | POA: Diagnosis not present

## 2023-08-09 DIAGNOSIS — J3081 Allergic rhinitis due to animal (cat) (dog) hair and dander: Secondary | ICD-10-CM | POA: Diagnosis not present

## 2023-08-09 DIAGNOSIS — J301 Allergic rhinitis due to pollen: Secondary | ICD-10-CM | POA: Diagnosis not present

## 2023-08-16 DIAGNOSIS — J3089 Other allergic rhinitis: Secondary | ICD-10-CM | POA: Diagnosis not present

## 2023-08-16 DIAGNOSIS — J3081 Allergic rhinitis due to animal (cat) (dog) hair and dander: Secondary | ICD-10-CM | POA: Diagnosis not present

## 2023-08-16 DIAGNOSIS — J301 Allergic rhinitis due to pollen: Secondary | ICD-10-CM | POA: Diagnosis not present

## 2023-08-20 ENCOUNTER — Telehealth: Payer: Self-pay | Admitting: Internal Medicine

## 2023-08-20 NOTE — Telephone Encounter (Signed)
 Patient dropped off document, to be filled out by provider. Patient requested to send it back via Fax within 7-days. Document is located in providers tray at front office.Please advise at [facility] [phone]

## 2023-08-20 NOTE — Telephone Encounter (Signed)
 Pt states that she is moving into [facility] In the future and need these forms signed by you. Forms have been placed inside office box

## 2023-08-22 NOTE — Telephone Encounter (Signed)
 Filled out

## 2023-08-23 DIAGNOSIS — J3089 Other allergic rhinitis: Secondary | ICD-10-CM | POA: Diagnosis not present

## 2023-08-23 DIAGNOSIS — J301 Allergic rhinitis due to pollen: Secondary | ICD-10-CM | POA: Diagnosis not present

## 2023-08-23 DIAGNOSIS — J3081 Allergic rhinitis due to animal (cat) (dog) hair and dander: Secondary | ICD-10-CM | POA: Diagnosis not present

## 2023-08-26 NOTE — Progress Notes (Unsigned)
  Cardiology Office Note:   Date:  08/27/2023  ID:  UNBORN CARREON, DOB 1936/03/16, MRN [id number] PCP: Adelia Homestead, MD  [name] [name] Cardiologist:  Eilleen Grates, MD {  History of Present Illness:   ARRIELLA [name] is a 88 y.o. female  who presents for followup of hypertension and palpitations.   She had dyspnea in 2016.  However, POET (Plain Old Exercise Treadmill) was negative for evidence of ischemia.  She returns for follow up.    Since I last saw her she has done OK.  The patient denies any new symptoms such as chest discomfort, neck or arm discomfort. There has been no new shortness of breath, PND or orthopnea. There have been no reported palpitations, presyncope or syncope.  She did have a fall the other day.   Because of this she has not been back to the gym.  She does still volunteer at the hospital.  Because of that she had not been back to the gym.  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  ROS: As stated in the HPI and negative for all other systems.  Studies Reviewed:    EKG:   EKG Interpretation Date/Time:  Monday August 27 2023 09:48:07 EDT Ventricular Rate:  85 PR Interval:  148 QRS Duration:  92 QT Interval:  354 QTC Calculation: 421 R Axis:   -48  Text Interpretation: Sinus rhythm with marked sinus arrhythmia Left axis deviation Moderate voltage criteria for LVH, may be normal variant ( R in aVL , Cornell product ) Anteroseptal infarct (cited on or before 04-Feb-1998) When compared with ECG of  08/21/22 No significant change since last tracing Confirmed by Eilleen Grates ([id number]) on 08/27/2023 10:11:51 AM     Risk Assessment/Calculations:              Physical Exam:   VS:  BP 132/84 (BP Location: Left Arm, Patient Position: Sitting)   Pulse 85   Ht 5\' 3"  (1.6 m)   Wt 117 lb (53.1 kg)   SpO2 98%   BMI 20.73 kg/m    Wt Readings from Last 3 Encounters:  08/27/23 117 lb (53.1 kg)  01/25/23 118  lb (53.5 kg)  11/13/22 114 lb (51.7 kg)     GEN: Well nourished, well developed in no acute distress NECK: No JVD; No carotid bruits CARDIAC: RRR, no murmurs, rubs, gallops RESPIRATORY:  Clear to auscultation without rales, wheezing or rhonchi  ABDOMEN: Soft, non-tender, non-distended EXTREMITIES:  No edema; No deformity   ASSESSMENT AND PLAN:   PALPITATIONS:   The patient does not feel her palpitations.  No change in therapy.  She will continue the meds as listed.  She has well with Cardizem .   HTN: Her blood pressure is at target.  No change in therapy.   ABNORMAL EKG: Her EKG looks unchanged from previous.  She has no new symptoms since stress testing in 2016.  No further workup.   She has no new symptoms.  She has some shortness of breath only climbing stairs.  However, this is mild.  I will make further cardiovascular testing is suggested.  She has had no new symptoms since having a stress test that was negative in 2016.  No further work-up.       Follow up with me in one year.   Signed, Eilleen Grates, MD

## 2023-08-27 ENCOUNTER — Ambulatory Visit: Payer: Medicare Other | Attending: Cardiology | Admitting: Cardiology

## 2023-08-27 ENCOUNTER — Encounter: Payer: Self-pay | Admitting: Cardiology

## 2023-08-27 VITALS — BP 132/84 | HR 85 | Ht 63.0 in | Wt 117.0 lb

## 2023-08-27 DIAGNOSIS — I10 Essential (primary) hypertension: Secondary | ICD-10-CM | POA: Diagnosis not present

## 2023-08-27 DIAGNOSIS — R9431 Abnormal electrocardiogram [ECG] [EKG]: Secondary | ICD-10-CM | POA: Insufficient documentation

## 2023-08-27 DIAGNOSIS — I341 Nonrheumatic mitral (valve) prolapse: Secondary | ICD-10-CM | POA: Diagnosis not present

## 2023-08-27 DIAGNOSIS — R002 Palpitations: Secondary | ICD-10-CM | POA: Insufficient documentation

## 2023-08-27 MED ORDER — NITROGLYCERIN 0.4 MG SL SUBL: 0.4000 mg | SUBLINGUAL_TABLET | SUBLINGUAL | 5 refills | Status: AC | PRN

## 2023-08-27 NOTE — Patient Instructions (Signed)
 Medication Instructions:  Your physician recommends that you continue on your current medications as directed. Please refer to the Current Medication list given to you today.  *If you need a refill on your cardiac medications before your next appointment, please call your pharmacy*  Follow-Up: At [facility], you and your health needs are our priority.  As part of our continuing mission to provide you with exceptional heart care, our providers are all part of one team.  This team includes your primary Cardiologist (physician) and Advanced Practice Providers or APPs (Physician Assistants and Nurse Practitioners) who all work together to provide you with the care you need, when you need it.  Your next appointment:   1 year(s)  Provider:   Eilleen Grates, MD

## 2023-08-30 DIAGNOSIS — J301 Allergic rhinitis due to pollen: Secondary | ICD-10-CM | POA: Diagnosis not present

## 2023-08-30 DIAGNOSIS — J3081 Allergic rhinitis due to animal (cat) (dog) hair and dander: Secondary | ICD-10-CM | POA: Diagnosis not present

## 2023-08-30 DIAGNOSIS — J3089 Other allergic rhinitis: Secondary | ICD-10-CM | POA: Diagnosis not present

## 2023-08-31 NOTE — Telephone Encounter (Signed)
 Called patient already in regards to this and was able to get a response and I have sent in the papers for the patient as well.

## 2023-09-03 ENCOUNTER — Ambulatory Visit (INDEPENDENT_AMBULATORY_CARE_PROVIDER_SITE_OTHER): Payer: Medicare Other | Admitting: Podiatry

## 2023-09-03 ENCOUNTER — Encounter: Payer: Self-pay | Admitting: Podiatry

## 2023-09-03 DIAGNOSIS — M79674 Pain in right toe(s): Secondary | ICD-10-CM | POA: Diagnosis not present

## 2023-09-03 DIAGNOSIS — M79675 Pain in left toe(s): Secondary | ICD-10-CM | POA: Diagnosis not present

## 2023-09-03 DIAGNOSIS — B351 Tinea unguium: Secondary | ICD-10-CM

## 2023-09-03 NOTE — Progress Notes (Unsigned)
 Subjective:   Patient ID: [name] [name], female   DOB: 88 y.o.   MRN: [id number]   HPI Chief Complaint  Patient presents with   RFC    RM#30 RFC     88 year old female presents for above concerns.  She was.  Checked the big toenail of the left foot and also her other nails need to be trimmed as they are elongated she cannot do them the cause discomfort as they become long.    Objective:  Physical Exam  General: AAO x3, NAD  Dermatological: On the left hallux nail the nail has come off it looks a new nail starting to grow in.  The remainder the nails are hypertrophic, dystrophic and elongated causing discomfort to nails 1 through 5 on the right and 2 through 5 on the left.  No open lesions.  Vascular: Dorsalis Pedis artery and Posterior Tibial artery pedal pulses are 2/4 bilateral with immedate capillary fill time. There is no pain with calf compression, swelling, warmth, erythema.   Neruologic: Grossly intact via light touch bilateral.   Musculoskeletal: No other areas of discomfort.     Assessment:   Symptomatic onychomycosis     Plan:  -Treatment options discussed including all alternatives, risks, and complications -Etiology of symptoms were discussed - Sharply debrided the toenails x 9 without any complications or bleeding - Continue topical medication of the left hallux toenail  Return in about 3 months (around 12/04/2023).  Charity Conch DPM

## 2023-09-06 DIAGNOSIS — J3081 Allergic rhinitis due to animal (cat) (dog) hair and dander: Secondary | ICD-10-CM | POA: Diagnosis not present

## 2023-09-06 DIAGNOSIS — J301 Allergic rhinitis due to pollen: Secondary | ICD-10-CM | POA: Diagnosis not present

## 2023-09-06 DIAGNOSIS — J3089 Other allergic rhinitis: Secondary | ICD-10-CM | POA: Diagnosis not present

## 2023-09-13 DIAGNOSIS — J3081 Allergic rhinitis due to animal (cat) (dog) hair and dander: Secondary | ICD-10-CM | POA: Diagnosis not present

## 2023-09-13 DIAGNOSIS — J3089 Other allergic rhinitis: Secondary | ICD-10-CM | POA: Diagnosis not present

## 2023-09-13 DIAGNOSIS — J301 Allergic rhinitis due to pollen: Secondary | ICD-10-CM | POA: Diagnosis not present

## 2023-09-25 DIAGNOSIS — H1045 Other chronic allergic conjunctivitis: Secondary | ICD-10-CM | POA: Diagnosis not present

## 2023-09-25 DIAGNOSIS — J301 Allergic rhinitis due to pollen: Secondary | ICD-10-CM | POA: Diagnosis not present

## 2023-09-25 DIAGNOSIS — J3081 Allergic rhinitis due to animal (cat) (dog) hair and dander: Secondary | ICD-10-CM | POA: Diagnosis not present

## 2023-09-25 DIAGNOSIS — J3089 Other allergic rhinitis: Secondary | ICD-10-CM | POA: Diagnosis not present

## 2023-10-02 DIAGNOSIS — M545 Low back pain, unspecified: Secondary | ICD-10-CM | POA: Diagnosis not present

## 2023-10-02 DIAGNOSIS — L821 Other seborrheic keratosis: Secondary | ICD-10-CM | POA: Diagnosis not present

## 2023-10-02 DIAGNOSIS — Z85828 Personal history of other malignant neoplasm of skin: Secondary | ICD-10-CM | POA: Diagnosis not present

## 2023-10-02 DIAGNOSIS — M791 Myalgia, unspecified site: Secondary | ICD-10-CM | POA: Diagnosis not present

## 2023-10-03 DIAGNOSIS — J3081 Allergic rhinitis due to animal (cat) (dog) hair and dander: Secondary | ICD-10-CM | POA: Diagnosis not present

## 2023-10-03 DIAGNOSIS — J301 Allergic rhinitis due to pollen: Secondary | ICD-10-CM | POA: Diagnosis not present

## 2023-10-03 DIAGNOSIS — J3089 Other allergic rhinitis: Secondary | ICD-10-CM | POA: Diagnosis not present

## 2023-10-11 ENCOUNTER — Telehealth: Payer: Self-pay | Admitting: Obstetrics and Gynecology

## 2023-10-11 DIAGNOSIS — J301 Allergic rhinitis due to pollen: Secondary | ICD-10-CM | POA: Diagnosis not present

## 2023-10-11 DIAGNOSIS — J3081 Allergic rhinitis due to animal (cat) (dog) hair and dander: Secondary | ICD-10-CM | POA: Diagnosis not present

## 2023-10-11 DIAGNOSIS — E2839 Other primary ovarian failure: Secondary | ICD-10-CM

## 2023-10-11 DIAGNOSIS — J3089 Other allergic rhinitis: Secondary | ICD-10-CM | POA: Diagnosis not present

## 2023-10-11 NOTE — Telephone Encounter (Signed)
 Bone scan reminder. Dr. Tia Flowers

## 2023-10-18 DIAGNOSIS — J301 Allergic rhinitis due to pollen: Secondary | ICD-10-CM | POA: Diagnosis not present

## 2023-10-18 DIAGNOSIS — J3081 Allergic rhinitis due to animal (cat) (dog) hair and dander: Secondary | ICD-10-CM | POA: Diagnosis not present

## 2023-10-18 DIAGNOSIS — J3089 Other allergic rhinitis: Secondary | ICD-10-CM | POA: Diagnosis not present

## 2023-10-23 NOTE — Telephone Encounter (Signed)
 Patient states that her pcp takes care of her bmd for her & she would like for them to continue doing it.

## 2023-10-25 DIAGNOSIS — J3081 Allergic rhinitis due to animal (cat) (dog) hair and dander: Secondary | ICD-10-CM | POA: Diagnosis not present

## 2023-10-25 DIAGNOSIS — J3089 Other allergic rhinitis: Secondary | ICD-10-CM | POA: Diagnosis not present

## 2023-10-25 DIAGNOSIS — J301 Allergic rhinitis due to pollen: Secondary | ICD-10-CM | POA: Diagnosis not present

## 2023-10-26 ENCOUNTER — Other Ambulatory Visit: Payer: Self-pay | Admitting: Cardiology

## 2023-11-01 DIAGNOSIS — J301 Allergic rhinitis due to pollen: Secondary | ICD-10-CM | POA: Diagnosis not present

## 2023-11-01 DIAGNOSIS — J3081 Allergic rhinitis due to animal (cat) (dog) hair and dander: Secondary | ICD-10-CM | POA: Diagnosis not present

## 2023-11-01 DIAGNOSIS — J3089 Other allergic rhinitis: Secondary | ICD-10-CM | POA: Diagnosis not present

## 2023-11-08 DIAGNOSIS — J3081 Allergic rhinitis due to animal (cat) (dog) hair and dander: Secondary | ICD-10-CM | POA: Diagnosis not present

## 2023-11-08 DIAGNOSIS — J301 Allergic rhinitis due to pollen: Secondary | ICD-10-CM | POA: Diagnosis not present

## 2023-11-08 DIAGNOSIS — J3089 Other allergic rhinitis: Secondary | ICD-10-CM | POA: Diagnosis not present

## 2023-11-12 ENCOUNTER — Ambulatory Visit (INDEPENDENT_AMBULATORY_CARE_PROVIDER_SITE_OTHER): Payer: Medicare Other

## 2023-11-12 VITALS — Ht 63.0 in | Wt 115.0 lb

## 2023-11-12 DIAGNOSIS — Z0000 Encounter for general adult medical examination without abnormal findings: Secondary | ICD-10-CM | POA: Diagnosis not present

## 2023-11-12 NOTE — Patient Instructions (Addendum)
 [name] [name] , Thank you for taking time out of your busy schedule to complete your Annual Wellness Visit with me. I enjoyed our conversation and look forward to speaking with you again next year. I, as well as your care team,  appreciate your ongoing commitment to your health goals. Please review the following plan we discussed and let me know if I can assist you in the future. Your Game plan/ To Do List    Referrals: If you haven't heard from the office you've been referred to, please reach out to them at the phone provided.  DEXA (bone scan) was ordered by PCP w/Dr Glennon Follow up Visits: Next Medicare AWV with our clinical staff: 11/14/2024   Have you seen your provider in the last 6 months (3 months if uncontrolled diabetes)? No Next Office Visit with your provider: 11/14/2023  Clinician Recommendations:  Aim for 30 minutes of exercise or brisk walking, 6-8 glasses of water, and 5 servings of fruits and vegetables each day.       This is a list of the screening recommended for you and due dates:  Health Maintenance  Topic Date Due   COVID-19 Vaccine (4 - 2024-25 season) 12/31/2022   Flu Shot  11/30/2023   Medicare Annual Wellness Visit  11/11/2024   DTaP/Tdap/Td vaccine (3 - Td or Tdap) 05/24/2028   Pneumococcal Vaccine for age over 61  Completed   DEXA scan (bone density measurement)  Completed   Zoster (Shingles) Vaccine  Completed   Hepatitis B Vaccine  Aged Out   HPV Vaccine  Aged Out   Meningitis B Vaccine  Aged Out    Advanced directives: (Copy Requested) Please bring a copy of your health care power of attorney and living will to the office to be added to your chart at your convenience. You can mail to [facility] [address] [place], [place] [address] or email to [email or web address] Advance Care Planning is important because it:  [x]  Makes sure you receive the medical care that is consistent with your values, goals, and preferences  [x]  It  provides guidance to your family and loved ones and reduces their decisional burden about whether or not they are making the right decisions based on your wishes.  Follow the link provided in your after visit summary or read over the paperwork we have mailed to you to help you started getting your Advance Directives in place. If you need assistance in completing these, please reach out to us  so that we can help you!

## 2023-11-12 NOTE — Progress Notes (Signed)
 Subjective:   [name] [name] is a 88 y.o. who presents for a Medicare Wellness preventive visit.  As a reminder, Annual Wellness Visits don't include a physical exam, and some assessments may be limited, especially if this visit is performed virtually. We may recommend an in-person follow-up visit with your provider if needed.  Visit Complete: Virtual I connected with  Tilton KATHEE Southgate on 11/12/23 by a audio enabled telemedicine application and verified that I am speaking with the correct person using two identifiers.  Patient Location: Home  Provider Location: Office/Clinic  I discussed the limitations of evaluation and management by telemedicine. The patient expressed understanding and agreed to proceed.  Vital Signs: Because this visit was a virtual/telehealth visit, some criteria may be missing or patient reported. Any vitals not documented were not able to be obtained and vitals that have been documented are patient reported.  VideoDeclined- This patient declined Librarian, academic. Therefore the visit was completed with audio only.  Persons Participating in Visit: Patient.  AWV Questionnaire: No: Patient Medicare AWV questionnaire was not completed prior to this visit.  Cardiac Risk Factors include: advanced age (>50men, >79 women);hypertension     Objective:    Today's Vitals   11/12/23 1405  Weight: 115 lb (52.2 kg)  Height: 5' 3 (1.6 m)   Body mass index is 20.37 kg/m.     11/12/2023    2:07 PM 11/09/2022    1:06 PM 02/01/2022    6:52 AM 01/23/2022   10:05 AM 11/14/2021    8:48 AM 02/13/2018   10:52 AM  Advanced Directives  Does Patient Have a Medical Advance Directive? Yes Yes Yes Yes Yes Yes   Type of Estate agent of [place];Living will Healthcare Power of [place];Living will  Healthcare Power of [place];Living will Healthcare Power of [place];Living will Healthcare Power of [place];Living will  Does  patient want to make changes to medical advance directive?    No - Patient declined    Copy of Healthcare Power of Attorney in Chart? No - copy requested No - copy requested  No - copy requested No - copy requested      Data saved with a previous flowsheet row definition    Current Medications (verified) Outpatient Encounter Medications as of 11/12/2023  Medication Sig   ALPRAZolam  (XANAX ) 0.5 MG tablet TAKE ONE TABLET BY MOUTH THREE TIMES DAILY AS NEEDED FOR ANXIETY   augmented betamethasone  dipropionate (DIPROLENE -AF) 0.05 % ointment Apply 0.05 Applications topically 2 (two) times daily. By Dermatologist   azelastine (ASTELIN) 0.1 % nasal spray Place 1-2 sprays into both nostrils daily as needed for allergies.   B Complex Vitamins (B COMPLEX PO) Take 1 mL by mouth daily.   Biotin 89999 MCG TABS Take 10,000 mcg by mouth daily.   Calcium  Carbonate-Vitamin D  (CALCIUM  600+D PO) Take 1 tablet by mouth daily.   carboxymethylcellulose (REFRESH TEARS) 0.5 % SOLN Place 1 drop into both eyes 3 (three) times daily.   cetirizine (ZYRTEC) 10 MG tablet Take 10 mg by mouth at bedtime.   cholecalciferol (VITAMIN D3) 25 MCG (1000 UNIT) tablet Take 1,000 Units by mouth daily.   Cyanocobalamin  1000 MCG/15ML LIQD Take by mouth.   diltiazem  (CARDIZEM  CD) 120 MG 24 hr capsule TAKE ONE CAPSULE BY MOUTH DAILY   EPINEPHrine  (EPI-PEN) 0.3 mg/0.3 mL DEVI Inject 0.3 mg into the muscle as needed (anaphylaxis).   fish oil-omega-3 fatty acids 1000 MG capsule Take 1 g by mouth 2 (two)  times daily.   fluticasone (FLONASE) 50 MCG/ACT nasal spray as needed.   folic acid (FOLVITE) 800 MCG tablet Take 800 mcg by mouth daily.   hydrocortisone 2.5 % cream as needed.   Multiple Vitamins-Minerals (QC [facility] VISION SUPPORT 2 PO) Take 1 tablet by mouth daily.   nitroGLYCERIN  (NITROSTAT ) 0.4 MG SL tablet Place 1 tablet (0.4 mg total) under the tongue every 5 (five) minutes as needed. May repeat 3 times   nystatin  ointment  (MYCOSTATIN ) SMARTSIG:Sparingly Topical Twice Daily   Omega 3 1000 MG CAPS Take by mouth.   Polyethyl Glycol-Propyl Glycol (SYSTANE OP) Apply to eye.   sodium chloride  (OCEAN) 0.65 % SOLN nasal spray Place 1 spray into both nostrils as needed for congestion.   traZODone  (DESYREL ) 100 MG tablet Take 1 tablet (100 mg total) by mouth at bedtime.   TURMERIC PO Take 1 tablet by mouth 2 (two) times daily.   UNABLE TO FIND Allergy shots weekly   White Petrolatum-Mineral Oil (REFRESH P.M. OP) Place 1 Application into both eyes once a week.   No facility-administered encounter medications on file as of 11/12/2023.    Allergies (verified) Codeine, Erythromycin, Other, Cefdinir , Gabapentin, and Levocetirizine   History: Past Medical History:  Diagnosis Date   Anxiety    Arthritis    Bilateral Thumbs   Coronary artery spasm (HCC)    Non obstructive CAD   Diverticulosis of colon    Fibromyalgia    Hx of colonic polyps    Lumbar back pain    MVP (mitral valve prolapse)    No antibiotics required for procedures   Osteoporosis    Palpitations    Pneumonia    twice years ago   PONV (postoperative nausea and vomiting)    Shingles    Past Surgical History:  Procedure Laterality Date   CARDIAC CATHETERIZATION     In 1980s   CATARACT EXTRACTION, BILATERAL     CATARACT EXTRACTION, BILATERAL  05/2018   CHOLECYSTECTOMY  03/2003   COLONOSCOPY     LUMBAR LAMINECTOMY     NASAL SEPTOPLASTY W/ TURBINOPLASTY Bilateral 02/01/2022   Procedure: NASAL SEPTOPLASTY AND INFERIOR TURBINATE REDUCTION;  Surgeon: Mable Lenis, MD;  [facility];  Service: ENT;  Laterality: Bilateral;   SINUS ENDO WITH FUSION Right 02/01/2022   Procedure: ENDOSCOPIC RESECTION OF RIGHT CONCHA BULLOSA WITH FUSION;  Surgeon: Mable Lenis, MD;  Location: [facility] OR;  Service: ENT;  Laterality: Right;   TONSILLECTOMY     as a child   Family History  Problem Relation Age of Onset   Rheum arthritis Mother    Stroke Mother     Hypertension Mother    Heart failure Father    Heart disease Father    Asthma Maternal Grandfather    Allergies Maternal Grandfather    COPD Maternal Aunt         2 aunts   Hypertension Brother    Social History   Socioeconomic History   Marital status: Widowed    Spouse name: Not on file   Number of children: 1   Years of education: Not on file   Highest education level: Not on file  Occupational History   Occupation: Systems analyst: RETIRED    Comment: [employer]    Comment: taught preschool, Diplomatic Services operational officer  Tobacco Use   Smoking status: Former    Current packs/day: 0.00    Average packs/day: 0.5 packs/day for 15.0 years (7.5 ttl pk-yrs)    Types:  Cigarettes    Start date: 05/02/1963    Quit date: 05/01/1978    Years since quitting: 45.5   Smokeless tobacco: Never  Vaping Use   Vaping status: Never Used  Substance and Sexual Activity   Alcohol use: Yes    Alcohol/week: 9.0 standard drinks of alcohol    Types: 9 Glasses of wine per week   Drug use: No   Sexual activity: Not Currently    Partners: Male    Birth control/protection: Post-menopausal    Comment: older than 16, less than 5  Other Topics Concern   Not on file  Social History Narrative   Married to Jori Prescott   1 daughter with ??FM, neck pain   Social Drivers of Corporate investment banker Strain: Low Risk  (11/12/2023)   Overall Financial Resource Strain (CARDIA)    Difficulty of Paying Living Expenses: Not hard at all  Food Insecurity: No Food Insecurity (11/12/2023)   Hunger Vital Sign    Worried About Running Out of Food in the Last Year: Never true    Ran Out of Food in the Last Year: Never true  Transportation Needs: No Transportation Needs (11/12/2023)   PRAPARE - Administrator, Civil Service (Medical): No    Lack of Transportation (Non-Medical): No  Physical Activity: Sufficiently Active (11/12/2023)   Exercise Vital Sign    Days of Exercise per Week: 5 days     Minutes of Exercise per Session: 30 min  Stress: No Stress Concern Present (11/12/2023)   [employer] of Occupational Health - Occupational Stress Questionnaire    Feeling of Stress: Not at all  Social Connections: Moderately Integrated (11/12/2023)   Social Connection and Isolation Panel    Frequency of Communication with Friends and Family: More than three times a week    Frequency of Social Gatherings with Friends and Family: More than three times a week    Attends Religious Services: More than 4 times per year    Active Member of [employer] or Organizations: Yes    Attends Banker Meetings: More than 4 times per year    Marital Status: Widowed    Tobacco Counseling Counseling given: No    Clinical Intake:  Pre-visit preparation completed: Yes  Pain : No/denies pain     BMI - recorded: 20.37 Nutritional Status: BMI of 19-24  Normal Nutritional Risks: None Diabetes: No  No results found for: HGBA1C   How often do you need to have someone help you when you read instructions, pamphlets, or other written materials from your doctor or pharmacy?: 1 - Never  Interpreter Needed?: No  Information entered by :: Verdie Saba, CMA   Activities of Daily Living     11/12/2023    2:11 PM  In your present state of health, do you have any difficulty performing the following activities:  Hearing? 0  Vision? 0  Difficulty concentrating or making decisions? 0  Walking or climbing stairs? 0  Dressing or bathing? 0  Doing errands, shopping? 0  Preparing Food and eating ? N  Using the Toilet? N  In the past six months, have you accidently leaked urine? N  Do you have problems with loss of bowel control? N  Managing your Medications? N  Managing your Finances? N  Housekeeping or managing your Housekeeping? N    Patient Care Team: Rollene Almarie LABOR, MD as PCP - General (Internal Medicine) Lavona Agent, MD as PCP - Cardiology (Cardiology) Charmayne Molly,  MD as Consulting Physician (Ophthalmology) Glennon Almarie POUR, MD as Consulting Physician (Obstetrics and Gynecology)  I have updated your Care Teams any recent Medical Services you may have received from other providers in the past year.     Assessment:   This is a routine wellness examination for [place].  Hearing/Vision screen Hearing Screening - Comments:: Denies hearing difficulties  - uses hearing aids Vision Screening - Comments:: Wears rx glasses - up to date with routine eye exams with Dr Charmayne   Goals Addressed               This Visit's Progress     Patient Stated (pt-stated)        Patient stated she plans to continue exercising       Depression Screen     11/12/2023    2:12 PM 11/13/2022   10:00 AM 11/13/2022    9:58 AM 11/09/2022    1:12 PM 04/03/2022    3:04 PM 11/14/2021    8:49 AM 11/07/2021    8:08 AM  PHQ 2/9 Scores  PHQ - 2 Score 0 0 0 0 0 0 0  PHQ- 9 Score 0 0 0 0 0  0    Fall Risk     11/12/2023    2:12 PM 01/25/2023    1:29 PM 11/13/2022    9:58 AM 11/09/2022    1:05 PM 04/03/2022    3:04 PM  Fall Risk   Falls in the past year? 1 0 0 0 0  Number falls in past yr: 0 0 0 0 0  Comment 1      Injury with Fall? 0 0 0 0 0  Risk for fall [name] to :  No Fall Risks  No Fall Risks   Follow up Falls evaluation completed;Falls prevention discussed Falls evaluation completed Falls evaluation completed Falls prevention discussed Falls evaluation completed      Data saved with a previous flowsheet row definition    MEDICARE RISK AT HOME:  Medicare Risk at Home Any stairs in or around the home?: Yes If so, are there any without handrails?: No Home free of loose throw rugs in walkways, pet beds, electrical cords, etc?: Yes Adequate lighting in your home to reduce risk of falls?: Yes Life alert?: Yes Use of a cane, walker or w/c?: No Grab bars in the bathroom?: Yes Shower chair or bench in shower?: Yes Elevated toilet seat or a handicapped toilet?:  Yes  TIMED UP AND GO:  Was the test performed?  No  Cognitive Function: 6CIT completed        11/12/2023    2:16 PM 11/09/2022    1:09 PM 11/14/2021    8:50 AM  6CIT Screen  What Year? 0 points 0 points 0 points  What month? 0 points 0 points 0 points  What time? 0 points 0 points 0 points  Count back from 20 0 points 0 points 0 points  Months in reverse 0 points 0 points 0 points  Repeat phrase 0 points 0 points 0 points  Total Score 0 points 0 points 0 points    Immunizations Immunization History  Administered Date(s) Administered   Influenza Split 01/30/2011   Influenza Whole 01/29/2009, 01/29/2010, 01/10/2012, 02/09/2014   Influenza, High Dose Seasonal PF 01/23/2022   Influenza,inj,Quad PF,6+ Mos 01/24/2013, 01/31/2016   Influenza-Unspecified 01/26/2015, 01/04/2018   PFIZER(Purple Top)SARS-COV-2 Vaccination 05/23/2019, 06/13/2019, 02/28/2020   Pneumococcal Conjugate-13 04/17/2014   Pneumococcal Polysaccharide-23 05/02/2003   Td 07/19/2009  Tdap 05/24/2018   Zoster Recombinant(Shingrix) 08/16/2016, 12/15/2016   Zoster, Live 07/31/2012    Screening Tests Health Maintenance  Topic Date [name]   COVID-19 Vaccine (4 - 2024-25 season) 12/31/2022   INFLUENZA VACCINE  11/30/2023   Medicare Annual Wellness (AWV)  11/11/2024   DTaP/Tdap/Td (3 - Td or Tdap) 05/24/2028   Pneumococcal Vaccine: 50+ Years  Completed   DEXA SCAN  Completed   Zoster Vaccines- Shingrix  Completed   Hepatitis B Vaccines  Aged Out   HPV VACCINES  Aged Out   Meningococcal B Vaccine  Aged Out    Health Maintenance  Health Maintenance [name]  Topic Date [name]   COVID-19 Vaccine (4 - 2024-25 season) 12/31/2022   Health Maintenance Items Addressed:  DEXA ordered -- at Dr Glennon [facility]  Additional Screening:  Vision Screening: Recommended annual ophthalmology exams for early detection of glaucoma and other disorders of the eye. Would you like a referral to an eye doctor? No    Dental Screening:  Recommended annual dental exams for proper oral hygiene  Community Resource Referral / Chronic Care Management: CRR required this visit?  No   CCM required this visit?  No   Plan:    I have personally reviewed and noted the following in the patient's chart:   Medical and social history Use of alcohol, tobacco or illicit drugs  Current medications and supplements including opioid prescriptions. Patient is not currently taking opioid prescriptions. Functional ability and status Nutritional status Physical activity Advanced directives List of other physicians Hospitalizations, surgeries, and ER visits in previous 12 months Vitals Screenings to include cognitive, depression, and falls Referrals and appointments  In addition, I have reviewed and discussed with patient certain preventive protocols, quality metrics, and best practice recommendations. A written personalized care plan for preventive services as well as general preventive health recommendations were provided to patient.   Verdie CHRISTELLA Saba, CMA   11/12/2023   After Visit Summary: (MyChart) [name] to this being a telephonic visit, the after visit summary with patients personalized plan was offered to patient via MyChart   Notes: Nothing significant to report at this time.

## 2023-11-14 ENCOUNTER — Ambulatory Visit: Payer: Medicare Other | Admitting: Internal Medicine

## 2023-11-14 ENCOUNTER — Encounter: Payer: Self-pay | Admitting: Internal Medicine

## 2023-11-14 VITALS — BP 120/82 | HR 98 | Temp 98.2°F | Ht 63.0 in | Wt 113.0 lb

## 2023-11-14 DIAGNOSIS — J4520 Mild intermittent asthma, uncomplicated: Secondary | ICD-10-CM | POA: Diagnosis not present

## 2023-11-14 DIAGNOSIS — F411 Generalized anxiety disorder: Secondary | ICD-10-CM | POA: Diagnosis not present

## 2023-11-14 DIAGNOSIS — M5442 Lumbago with sciatica, left side: Secondary | ICD-10-CM

## 2023-11-14 DIAGNOSIS — M8589 Other specified disorders of bone density and structure, multiple sites: Secondary | ICD-10-CM | POA: Diagnosis not present

## 2023-11-14 DIAGNOSIS — I10 Essential (primary) hypertension: Secondary | ICD-10-CM | POA: Diagnosis not present

## 2023-11-14 LAB — CBC
HCT: 44.7 % (ref 36.0–46.0)
Hemoglobin: 15.1 g/dL — ABNORMAL HIGH (ref 12.0–15.0)
MCHC: 33.8 g/dL (ref 30.0–36.0)
MCV: 103.8 fl — ABNORMAL HIGH (ref 78.0–100.0)
Platelets: 249 K/uL (ref 150.0–400.0)
RBC: 4.31 Mil/uL (ref 3.87–5.11)
RDW: 13.1 % (ref 11.5–15.5)
WBC: 7.8 K/uL (ref 4.0–10.5)

## 2023-11-14 LAB — COMPREHENSIVE METABOLIC PANEL WITH GFR
ALT: 21 U/L (ref 0–35)
AST: 28 U/L (ref 0–37)
Albumin: 4.4 g/dL (ref 3.5–5.2)
Alkaline Phosphatase: 137 U/L — ABNORMAL HIGH (ref 39–117)
BUN: 11 mg/dL (ref 6–23)
CO2: 26 meq/L (ref 19–32)
Calcium: 9.6 mg/dL (ref 8.4–10.5)
Chloride: 94 meq/L — ABNORMAL LOW (ref 96–112)
Creatinine, Ser: 0.71 mg/dL (ref 0.40–1.20)
GFR: 76.95 mL/min (ref >60.00)
Glucose, Bld: 90 mg/dL (ref 70–99)
Potassium: 4 meq/L (ref 3.5–5.1)
Sodium: 132 meq/L — ABNORMAL LOW (ref 135–145)
Total Bilirubin: 0.6 mg/dL (ref 0.2–1.2)
Total Protein: 7.4 g/dL (ref 6.0–8.3)

## 2023-11-14 LAB — LIPID PANEL
Cholesterol: 182 mg/dL (ref 0–200)
HDL: 83.4 mg/dL (ref >39.00)
LDL Cholesterol: 77 mg/dL (ref 0–99)
NonHDL: 98.43
Total CHOL/HDL Ratio: 2
Triglycerides: 106 mg/dL (ref 0.0–149.0)
VLDL: 21.2 mg/dL (ref 0.0–40.0)

## 2023-11-14 LAB — VITAMIN D 25 HYDROXY (VIT D DEFICIENCY, FRACTURES): VITD: 99.14 ng/mL (ref 30.00–100.00)

## 2023-11-14 NOTE — Patient Instructions (Signed)
 We will check the labs today and get the bone density test done.

## 2023-11-14 NOTE — Progress Notes (Unsigned)
   Subjective:   Patient ID: [name] [name], female    DOB: [birth year], 88 y.o.   MRN: [id number]  HPI The patient is a 88 YO female coming in for medical management (see A/P for details).   Review of Systems  Objective:  Physical Exam  Vitals:   11/14/23 1004  BP: 120/82  Pulse: 98  Temp: 98.2 F (36.8 C)  TempSrc: Oral  SpO2: 98%  Weight: 113 lb (51.3 kg)  Height: 5' 3 (1.6 m)    Assessment & Plan:

## 2023-11-15 ENCOUNTER — Ambulatory Visit: Payer: Self-pay | Admitting: Internal Medicine

## 2023-11-15 ENCOUNTER — Encounter: Payer: Self-pay | Admitting: Internal Medicine

## 2023-11-15 DIAGNOSIS — J3081 Allergic rhinitis due to animal (cat) (dog) hair and dander: Secondary | ICD-10-CM | POA: Diagnosis not present

## 2023-11-15 DIAGNOSIS — J3089 Other allergic rhinitis: Secondary | ICD-10-CM | POA: Diagnosis not present

## 2023-11-15 DIAGNOSIS — J301 Allergic rhinitis due to pollen: Secondary | ICD-10-CM | POA: Diagnosis not present

## 2023-11-15 NOTE — Assessment & Plan Note (Signed)
 Checking CBC, CMP, lipid panel and adjust as needed. At goal on diltiazem .

## 2023-11-15 NOTE — Assessment & Plan Note (Signed)
 Uses alprazolam  0.5 mg TID and stable. Will continue.

## 2023-11-15 NOTE — Assessment & Plan Note (Signed)
 Getting allergy shots and stable without flare today.

## 2023-11-15 NOTE — Assessment & Plan Note (Signed)
 Still struggling with this and gets injections in her back.

## 2023-11-15 NOTE — Assessment & Plan Note (Signed)
 Due for dexa in nov 2025 ordered. If stable would likely not recommend further.

## 2023-11-20 ENCOUNTER — Telehealth: Payer: Self-pay

## 2023-11-20 NOTE — Telephone Encounter (Signed)
 Copied from CRM #[id number]. Topic: Clinical - Lab/Test Results >> Nov 20, 2023  1:47 PM Burnard DEL wrote: Reason for CRM: Patient called in to get her lab results from last week. I relayed  lab message to her from provider .Patient would like to know if she stops the vitamin D ,what does she suppose to do? She stated that she has been taking vitamin D  for a long time. She would like a phone call and not a Clinical cytogeneticist message. She would also like to know what her cholesterol levels were?

## 2023-11-21 NOTE — Telephone Encounter (Signed)
 Please advise in regards to vitamin D 

## 2023-11-22 DIAGNOSIS — J301 Allergic rhinitis due to pollen: Secondary | ICD-10-CM | POA: Diagnosis not present

## 2023-11-22 DIAGNOSIS — J3089 Other allergic rhinitis: Secondary | ICD-10-CM | POA: Diagnosis not present

## 2023-11-22 DIAGNOSIS — J3081 Allergic rhinitis due to animal (cat) (dog) hair and dander: Secondary | ICD-10-CM | POA: Diagnosis not present

## 2023-11-29 DIAGNOSIS — J3089 Other allergic rhinitis: Secondary | ICD-10-CM | POA: Diagnosis not present

## 2023-11-29 DIAGNOSIS — J3081 Allergic rhinitis due to animal (cat) (dog) hair and dander: Secondary | ICD-10-CM | POA: Diagnosis not present

## 2023-11-29 DIAGNOSIS — J301 Allergic rhinitis due to pollen: Secondary | ICD-10-CM | POA: Diagnosis not present

## 2023-12-04 ENCOUNTER — Ambulatory Visit: Admitting: Podiatry

## 2023-12-06 DIAGNOSIS — J3089 Other allergic rhinitis: Secondary | ICD-10-CM | POA: Diagnosis not present

## 2023-12-06 DIAGNOSIS — J3081 Allergic rhinitis due to animal (cat) (dog) hair and dander: Secondary | ICD-10-CM | POA: Diagnosis not present

## 2023-12-06 DIAGNOSIS — J301 Allergic rhinitis due to pollen: Secondary | ICD-10-CM | POA: Diagnosis not present

## 2023-12-10 ENCOUNTER — Ambulatory Visit (INDEPENDENT_AMBULATORY_CARE_PROVIDER_SITE_OTHER)
Admission: RE | Admit: 2023-12-10 | Discharge: 2023-12-10 | Disposition: A | Discharge status: Home / Self Care | Source: Ambulatory Visit | Attending: Internal Medicine | Admitting: Internal Medicine

## 2023-12-10 DIAGNOSIS — M8589 Other specified disorders of bone density and structure, multiple sites: Secondary | ICD-10-CM | POA: Diagnosis not present

## 2023-12-10 IMAGING — CR DG CHEST 2V
2 series · 2 of 2 positions shown · non-contrast
Comparison: July 24, 2016

CLINICAL DATA: Cough.

EXAM:
CHEST - 2 VIEW

[w chest pa]
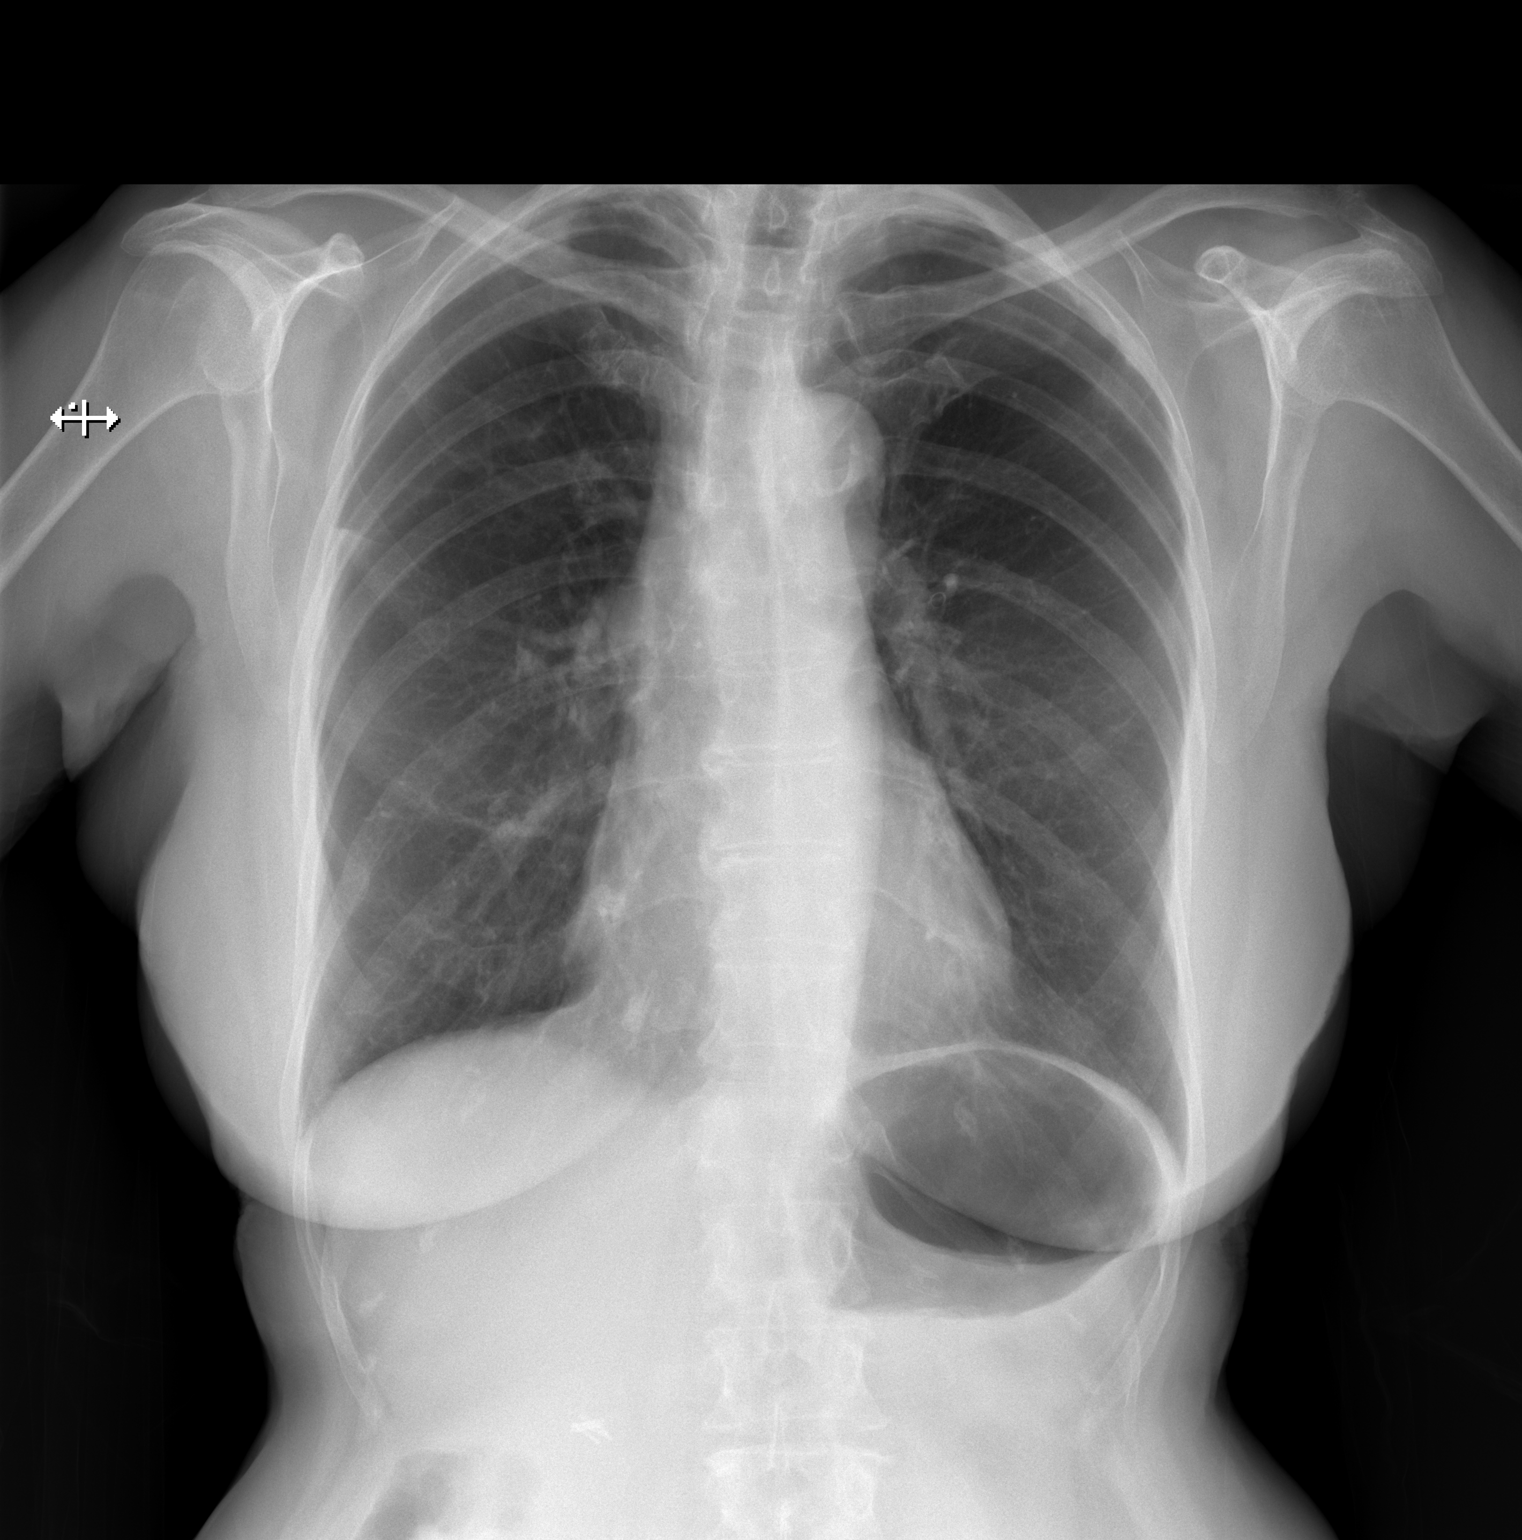

[w chest lat]
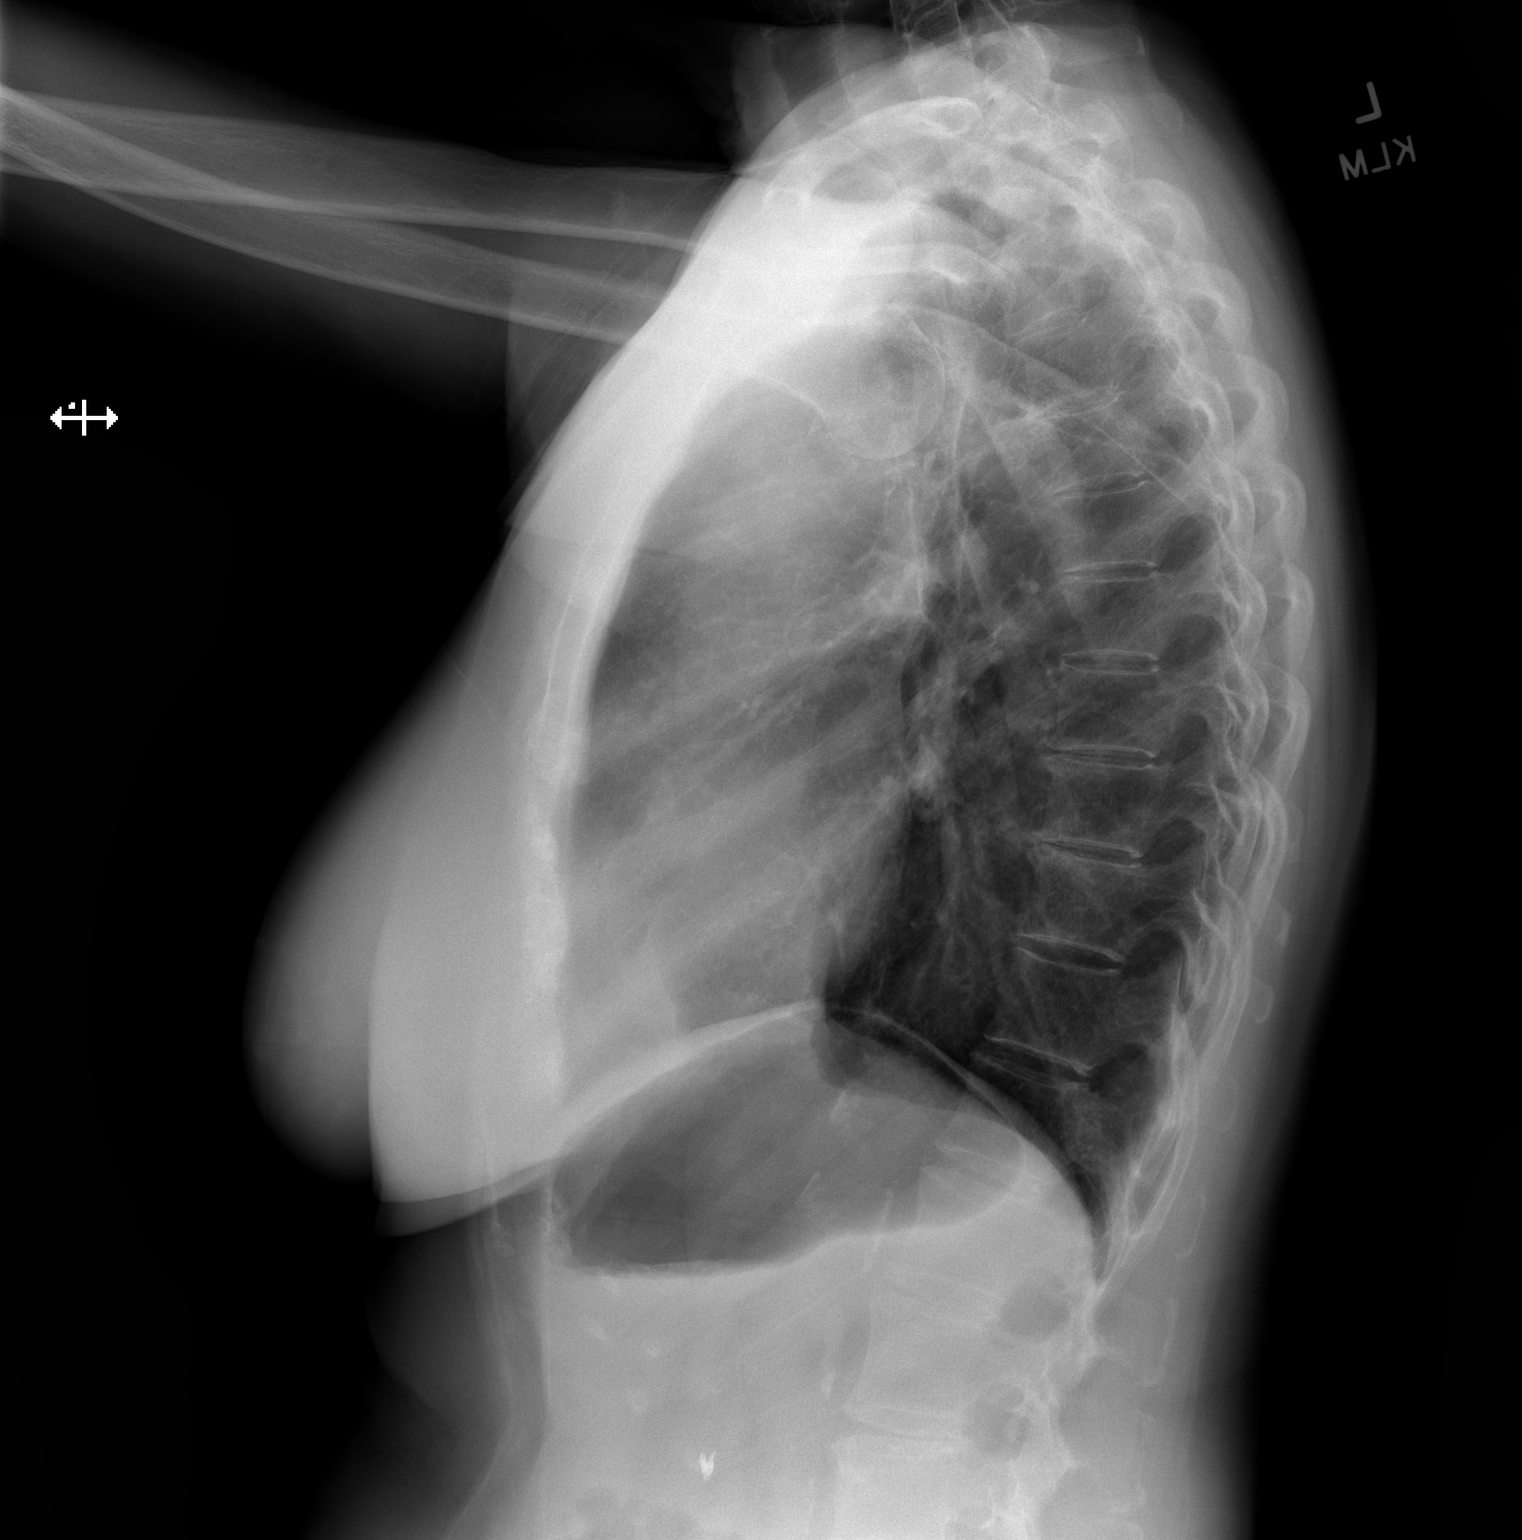

[2 of 2 positions shown; findings below may reference images not displayed]

FINDINGS: The cardiomediastinal silhouette is stable. No pneumothorax. Focal
scarring along the periphery of the right mid lung, unchanged since
July 28, 2013. No suspicious nodules or masses. No focal
infiltrates. No other acute abnormalities.
IMPRESSION: No active cardiopulmonary disease.

## 2023-12-11 DIAGNOSIS — M545 Low back pain, unspecified: Secondary | ICD-10-CM | POA: Diagnosis not present

## 2023-12-13 DIAGNOSIS — J301 Allergic rhinitis due to pollen: Secondary | ICD-10-CM | POA: Diagnosis not present

## 2023-12-13 DIAGNOSIS — J3081 Allergic rhinitis due to animal (cat) (dog) hair and dander: Secondary | ICD-10-CM | POA: Diagnosis not present

## 2023-12-13 DIAGNOSIS — J3089 Other allergic rhinitis: Secondary | ICD-10-CM | POA: Diagnosis not present

## 2023-12-14 ENCOUNTER — Other Ambulatory Visit: Payer: Self-pay | Admitting: Internal Medicine

## 2023-12-14 LAB — HM DEXA SCAN: HM Dexa Scan: -1.9

## 2023-12-17 NOTE — Telephone Encounter (Signed)
 Copied from CRM #[id number]. Topic: Clinical - Prescription Issue >> Dec 17, 2023 11:33 AM Drema MATSU wrote: Reason for CRM: Patient has only pill left of traZODone  (DESYREL ) 100 MG tablet and ALPRAZolam  (XANAX ) 0.5 MG tablet.

## 2023-12-20 DIAGNOSIS — M5416 Radiculopathy, lumbar region: Secondary | ICD-10-CM | POA: Diagnosis not present

## 2023-12-24 ENCOUNTER — Ambulatory Visit (INDEPENDENT_AMBULATORY_CARE_PROVIDER_SITE_OTHER): Admitting: Podiatry

## 2023-12-24 DIAGNOSIS — B351 Tinea unguium: Secondary | ICD-10-CM | POA: Diagnosis not present

## 2023-12-24 DIAGNOSIS — M79675 Pain in left toe(s): Secondary | ICD-10-CM | POA: Diagnosis not present

## 2023-12-24 DIAGNOSIS — M79674 Pain in right toe(s): Secondary | ICD-10-CM

## 2023-12-24 NOTE — Progress Notes (Unsigned)
 Subjective:   Patient ID: [name] [name], female   DOB: 88 y.o.   MRN: [id number]   HPI Chief Complaint  Patient presents with   Nail Problem    Pt stated that she is here to have her nails trimmed down     88 year old female presents for above concerns.  Her nails are thickened elongated she cannot trim them and they are causing discomfort.  She is also asked about her left ankle.  She has sciatica and nerve pain radiates down to her ankle.  She has had an injection.  She thinks her ankle pain is related to the nerve pain.    Objective:  Physical Exam  General: AAO x3, NAD  Dermatological: On the left hallux nail the nail has come off it looks a new nail starting to grow in.  The remainder the nails are hypertrophic, dystrophic and elongated causing discomfort to nails 1 through 5 on the right and 2 through 5 on the left.  No open lesions.  Vascular: Dorsalis Pedis artery and Posterior Tibial artery pedal pulses are 2/4 bilateral with immedate capillary fill time. There is no pain with calf compression, swelling, warmth, erythema.   Neruologic: Grossly intact via light touch bilateral.   Musculoskeletal: There is no area pinpoint tenderness.  She does get some discomfort on the lateral aspect of ankle describing sharp symptoms.     Assessment:   Symptomatic onychomycosis     Plan:  -Treatment options discussed including all alternatives, risks, and complications -Etiology of symptoms were discussed - Sharply debrided the toenails x 9 without any complications or bleeding - Continue topical medication of the left hallux toenail - Ankle pain likely resulted in nerve symptoms.  We discussed we will continue to monitor but if her ankle pain persists once the nerve symptoms in her leg are resolved we will further evaluate this.  She also believes is coming from the nerve.   [name] [name] DPM

## 2023-12-27 DIAGNOSIS — J301 Allergic rhinitis due to pollen: Secondary | ICD-10-CM | POA: Diagnosis not present

## 2023-12-27 DIAGNOSIS — J3089 Other allergic rhinitis: Secondary | ICD-10-CM | POA: Diagnosis not present

## 2023-12-27 DIAGNOSIS — J3081 Allergic rhinitis due to animal (cat) (dog) hair and dander: Secondary | ICD-10-CM | POA: Diagnosis not present

## 2024-01-01 DIAGNOSIS — M791 Myalgia, unspecified site: Secondary | ICD-10-CM | POA: Diagnosis not present

## 2024-01-01 DIAGNOSIS — M545 Low back pain, unspecified: Secondary | ICD-10-CM | POA: Diagnosis not present

## 2024-01-10 DIAGNOSIS — J3089 Other allergic rhinitis: Secondary | ICD-10-CM | POA: Diagnosis not present

## 2024-01-10 DIAGNOSIS — J3081 Allergic rhinitis due to animal (cat) (dog) hair and dander: Secondary | ICD-10-CM | POA: Diagnosis not present

## 2024-01-10 DIAGNOSIS — J301 Allergic rhinitis due to pollen: Secondary | ICD-10-CM | POA: Diagnosis not present

## 2024-01-14 ENCOUNTER — Ambulatory Visit (INDEPENDENT_AMBULATORY_CARE_PROVIDER_SITE_OTHER): Admitting: Internal Medicine

## 2024-01-14 ENCOUNTER — Ambulatory Visit: Payer: Self-pay

## 2024-01-14 ENCOUNTER — Encounter: Payer: Self-pay | Admitting: Internal Medicine

## 2024-01-14 VITALS — BP 130/68 | HR 98 | Temp 99.3°F | Ht 63.0 in | Wt 112.0 lb

## 2024-01-14 DIAGNOSIS — U071 COVID-19: Secondary | ICD-10-CM

## 2024-01-14 LAB — POC COVID19 BINAXNOW: SARS Coronavirus 2 Ag: POSITIVE — AB

## 2024-01-14 MED ORDER — NIRMATRELVIR/RITONAVIR (PAXLOVID)TABLET
3.0000 | ORAL_TABLET | Freq: Two times a day (BID) | ORAL | 0 refills | Status: AC
Start: 2024-01-14 — End: 2024-01-19

## 2024-01-14 NOTE — Progress Notes (Signed)
 Subjective:    Patient ID: [name] [name], female    DOB: 1936-04-06, 88 y.o.   MRN: [id number]      HPI [name] [name] is here for  Chief Complaint  Patient presents with   Nasal Congestion    Started feeling bad on Thursday night; Nasal congestion, cough (worse at night); runny nose; Dry hacking cough   Discussed the use of AI scribe software for clinical note transcription with the patient, who gave verbal consent to proceed.  History of Present Illness [name] [name] is an 88 year old female who presents with symptoms consistent with COVID-19.  Symptoms began during the night on Thursday with a dry, hacking cough, rhinorrhea, postnasal drainage, and a sore throat. She has been taking Mucinex DM to manage her symptoms.  She denies fever but experienced a slight chill on Thursday night. She has been drinking hot tea with honey and lemon to soothe her throat. No headaches, lightheadedness, dizziness, ear pain, or gastrointestinal symptoms such as nausea or diarrhea. Her energy level is lower than usual, but she does not feel incapacitated. No shortness of breath, wheezing, or myalgias.   She has a history of sciatica and is supposed to start a second round of prednisone , which she is hesitant to take. She takes alprazolam  twice daily and trazodone  at night. She has not had COVID-19 before but has received all vaccinations except the latest one. Her appetite is reduced, but she ensures she eats.       Medications and allergies reviewed with patient and updated if appropriate.  Current Outpatient Medications on File Prior to Visit  Medication Sig Dispense Refill   ALPRAZolam  (XANAX ) 0.5 MG tablet TAKE ONE TABLET BY MOUTH THREE TIMES DAILY AS NEEDED FOR ANXIETY 70 tablet 5   augmented betamethasone  dipropionate (DIPROLENE -AF) 0.05 % ointment Apply 0.05 Applications topically 2 (two) times daily. By Dermatologist     azelastine (ASTELIN) 0.1 % nasal spray Place 1-2 sprays into both  nostrils daily as needed for allergies.     B Complex Vitamins (B COMPLEX PO) Take 1 mL by mouth daily.     Biotin 89999 MCG TABS Take 10,000 mcg by mouth daily.     Calcium  Carbonate-Vitamin D  (CALCIUM  600+D PO) Take 1 tablet by mouth daily.     carboxymethylcellulose (REFRESH TEARS) 0.5 % SOLN Place 1 drop into both eyes 3 (three) times daily.     cetirizine (ZYRTEC) 10 MG tablet Take 10 mg by mouth at bedtime.     cholecalciferol (VITAMIN D3) 25 MCG (1000 UNIT) tablet Take 1,000 Units by mouth daily.     Cyanocobalamin  1000 MCG/15ML LIQD Take by mouth.     diltiazem  (CARDIZEM  CD) 120 MG 24 hr capsule TAKE ONE CAPSULE BY MOUTH DAILY 90 capsule 0   EPINEPHrine  (EPI-PEN) 0.3 mg/0.3 mL DEVI Inject 0.3 mg into the muscle as needed (anaphylaxis).     fish oil-omega-3 fatty acids 1000 MG capsule Take 1 g by mouth 2 (two) times daily.     fluticasone (FLONASE) 50 MCG/ACT nasal spray as needed.     folic acid (FOLVITE) 800 MCG tablet Take 800 mcg by mouth daily.     hydrocortisone 2.5 % cream as needed.     Multiple Vitamins-Minerals (QC [facility] SUPPORT 2 PO) Take 1 tablet by mouth daily.     nitroGLYCERIN  (NITROSTAT ) 0.4 MG SL tablet Place 1 tablet (0.4 mg total) under the tongue every 5 (five) minutes as needed. May repeat 3  times 25 tablet 5   nystatin  ointment (MYCOSTATIN ) SMARTSIG:Sparingly Topical Twice Daily     Omega 3 1000 MG CAPS Take by mouth.     Polyethyl Glycol-Propyl Glycol (SYSTANE OP) Apply to eye.     predniSONE  (DELTASONE ) 5 MG tablet Take 1 dose pk by oral route as directed for 6 days.     sodium chloride  (OCEAN) 0.65 % SOLN nasal spray Place 1 spray into both nostrils as needed for congestion.     traZODone  (DESYREL ) 100 MG tablet TAKE ONE TABLET BY MOUTH AT BEDTIME 90 tablet 3   TURMERIC PO Take 1 tablet by mouth 2 (two) times daily.     UNABLE TO FIND Allergy shots weekly     White Petrolatum-Mineral Oil (REFRESH P.M. OP) Place 1 Application into both eyes once a  week.     No current facility-administered medications on file prior to visit.    Review of Systems  Constitutional:  Positive for chills and fatigue. Negative for fever.  HENT:  Positive for postnasal drip, rhinorrhea and sore throat (at night). Negative for congestion and ear pain.   Respiratory:  Positive for cough (dry, occ feels like there is mucus). Negative for shortness of breath and wheezing.   Gastrointestinal:  Negative for diarrhea and nausea.  Neurological:  Negative for dizziness, light-headedness and headaches.  Psychiatric/Behavioral:  Hallucinations: covid.        Objective:   Vitals:   01/14/24 1602  BP: 130/68  Pulse: 98  Temp: 99.3 F (37.4 C)  SpO2: 96%   BP Readings from Last 3 Encounters:  01/14/24 130/68  11/14/23 120/82  08/27/23 132/84   Wt Readings from Last 3 Encounters:  01/14/24 112 lb (50.8 kg)  11/14/23 113 lb (51.3 kg)  11/12/23 115 lb (52.2 kg)   Body mass index is 19.84 kg/m.    Physical Exam Constitutional:      General: She is not in acute distress.    Appearance: Normal appearance. She is not ill-appearing.  HENT:     Head: Normocephalic and atraumatic.     Right Ear: Tympanic membrane, ear canal and external ear normal.     Left Ear: Tympanic membrane, ear canal and external ear normal.     Mouth/Throat:     Mouth: Mucous membranes are moist.     Pharynx: No oropharyngeal exudate or posterior oropharyngeal erythema.  Eyes:     Conjunctiva/sclera: Conjunctivae normal.  Cardiovascular:     Rate and Rhythm: Normal rate and regular rhythm.  Pulmonary:     Effort: Pulmonary effort is normal. No respiratory distress.     Breath sounds: Normal breath sounds. No wheezing or rales.  Musculoskeletal:     Cervical back: Neck supple. No tenderness.  Lymphadenopathy:     Cervical: No cervical adenopathy.  Skin:    General: Skin is warm and dry.  Neurological:     Mental Status: She is alert.            Assessment &  Plan:    Assessment and Plan Assessment & Plan COVID-19 infection Acute COVID-19 infection with dry cough, rhinorrhea, sore throat, and fatigue. Eligible for antiviral treatment.  Discussed treatment with an antiviral - Prescribe Paxlovid  for 5 days.  Reviewed possible side effects. - Decrease trazodone  dose by 50% while on Paxlovid . - Continue alprazolam  at current dose given low dose and only taking it twice a day. - Continue Mucinex, Tylenol , and other OTC medications for symptom relief. - Encourage rest and  fluid intake. - Advise wearing a mask around others after the initial 5 days of isolation. - Advised for her to call with any questions or concerns  Chronic sciatica Chronic sciatica with planned prednisone  treatment. Advised to delay due to COVID-19  - Hold off on starting prednisone  until recovery from COVID-19.

## 2024-01-14 NOTE — Telephone Encounter (Signed)
 FYI Only or Action Required?: FYI only for provider.  Patient was last seen in primary care on 11/14/2023 by Rollene Almarie LABOR, MD.  Called Nurse Triage reporting Covid Positive. Cough is worst symptom. Sleeping with 3 pillows.  Symptoms began several days ago.  Interventions attempted: OTC medications: Mucinex.  Symptoms are: unchanged.  Triage Disposition: Call PCP Within 24 Hours  Patient/caregiver understands and will follow disposition?: Yes                       Copied from CRM [id number]. Topic: Clinical - Red Word Triage >> Jan 14, 2024  2:49 PM Rea ORN wrote: Red Word that prompted transfer to Nurse Triage: Possible Covid. Increased mucus, cough. Reason for Disposition  [1] HIGH RISK patient (e.g., weak immune system, age > 64 years, obesity with BMI 30 or higher, pregnant, chronic lung disease or other chronic medical condition) AND [2] COVID symptoms (e.g., cough, fever)  (Exceptions: Already seen by PCP and no new or worsening symptoms.)  Answer Assessment - Initial Assessment Questions 1. COVID-19 DIAGNOSIS: How do you know that you have COVID? (e.g., positive lab test or self-test, diagnosed by doctor or NP/PA, symptoms after exposure).     Home test 2. COVID-19 EXPOSURE: Was there any known exposure to COVID before the symptoms began? CDC Definition of close contact: within 6 feet (2 meters) for a total of 15 minutes or more over a 24-hour period.      Yes brother has COVID and was visiting last week 3. ONSET: When did the COVID-19 symptoms start?      Thures. 4. WORST SYMPTOM: What is your worst symptom? (e.g., cough, fever, shortness of breath, muscle aches)     coughing 5. COUGH: Do you have a cough? If Yes, ask: How bad is the cough?       Yes - sore from coughing 6. FEVER: Do you have a fever? If Yes, ask: What is your temperature, how was it measured, and when did it start?     unsure 7. RESPIRATORY STATUS: Describe your  breathing? (e.g., normal; shortness of breath, wheezing, unable to speak)      coughing 8. BETTER-SAME-WORSE: Are you getting better, staying the same or getting worse compared to yesterday?  If getting worse, ask, In what way?     worse 9. OTHER SYMPTOMS: Do you have any other symptoms?  (e.g., chills, fatigue, headache, loss of smell or taste, muscle pain, sore throat)     mucous 10. HIGH RISK DISEASE: Do you have any chronic medical problems? (e.g., asthma, heart or lung disease, weak immune system, obesity, etc.)       no  Protocols used: Coronavirus (COVID-19) Diagnosed or Suspected-A-AH

## 2024-01-14 NOTE — Patient Instructions (Addendum)
      You have covid.      Medications changes include :   start paxlovid     Decrease trazodone  to 50 mg daily.       Return if symptoms worsen or fail to improve.

## 2024-01-21 ENCOUNTER — Other Ambulatory Visit: Payer: Self-pay | Admitting: Cardiology

## 2024-01-24 ENCOUNTER — Encounter: Payer: Self-pay | Admitting: *Deleted

## 2024-01-24 DIAGNOSIS — J301 Allergic rhinitis due to pollen: Secondary | ICD-10-CM | POA: Diagnosis not present

## 2024-01-24 DIAGNOSIS — J3089 Other allergic rhinitis: Secondary | ICD-10-CM | POA: Diagnosis not present

## 2024-01-24 DIAGNOSIS — J3081 Allergic rhinitis due to animal (cat) (dog) hair and dander: Secondary | ICD-10-CM | POA: Diagnosis not present

## 2024-01-29 ENCOUNTER — Encounter: Payer: Medicare Other | Admitting: Obstetrics and Gynecology

## 2024-01-29 DIAGNOSIS — M5416 Radiculopathy, lumbar region: Secondary | ICD-10-CM | POA: Diagnosis not present

## 2024-01-31 DIAGNOSIS — J301 Allergic rhinitis due to pollen: Secondary | ICD-10-CM | POA: Diagnosis not present

## 2024-01-31 DIAGNOSIS — J3089 Other allergic rhinitis: Secondary | ICD-10-CM | POA: Diagnosis not present

## 2024-01-31 DIAGNOSIS — J3081 Allergic rhinitis due to animal (cat) (dog) hair and dander: Secondary | ICD-10-CM | POA: Diagnosis not present

## 2024-02-04 DIAGNOSIS — Z85828 Personal history of other malignant neoplasm of skin: Secondary | ICD-10-CM | POA: Diagnosis not present

## 2024-02-04 DIAGNOSIS — D2272 Melanocytic nevi of left lower limb, including hip: Secondary | ICD-10-CM | POA: Diagnosis not present

## 2024-02-04 DIAGNOSIS — D2372 Other benign neoplasm of skin of left lower limb, including hip: Secondary | ICD-10-CM | POA: Diagnosis not present

## 2024-02-04 DIAGNOSIS — L820 Inflamed seborrheic keratosis: Secondary | ICD-10-CM | POA: Diagnosis not present

## 2024-02-04 DIAGNOSIS — K130 Diseases of lips: Secondary | ICD-10-CM | POA: Diagnosis not present

## 2024-02-04 DIAGNOSIS — L814 Other melanin hyperpigmentation: Secondary | ICD-10-CM | POA: Diagnosis not present

## 2024-02-04 DIAGNOSIS — L570 Actinic keratosis: Secondary | ICD-10-CM | POA: Diagnosis not present

## 2024-02-04 DIAGNOSIS — L821 Other seborrheic keratosis: Secondary | ICD-10-CM | POA: Diagnosis not present

## 2024-02-04 DIAGNOSIS — D692 Other nonthrombocytopenic purpura: Secondary | ICD-10-CM | POA: Diagnosis not present

## 2024-02-07 DIAGNOSIS — J301 Allergic rhinitis due to pollen: Secondary | ICD-10-CM | POA: Diagnosis not present

## 2024-02-07 DIAGNOSIS — J3089 Other allergic rhinitis: Secondary | ICD-10-CM | POA: Diagnosis not present

## 2024-02-07 DIAGNOSIS — J3081 Allergic rhinitis due to animal (cat) (dog) hair and dander: Secondary | ICD-10-CM | POA: Diagnosis not present

## 2024-02-14 DIAGNOSIS — J3081 Allergic rhinitis due to animal (cat) (dog) hair and dander: Secondary | ICD-10-CM | POA: Diagnosis not present

## 2024-02-14 DIAGNOSIS — J301 Allergic rhinitis due to pollen: Secondary | ICD-10-CM | POA: Diagnosis not present

## 2024-02-14 DIAGNOSIS — J3089 Other allergic rhinitis: Secondary | ICD-10-CM | POA: Diagnosis not present

## 2024-02-28 DIAGNOSIS — J3089 Other allergic rhinitis: Secondary | ICD-10-CM | POA: Diagnosis not present

## 2024-02-28 DIAGNOSIS — J3081 Allergic rhinitis due to animal (cat) (dog) hair and dander: Secondary | ICD-10-CM | POA: Diagnosis not present

## 2024-02-28 DIAGNOSIS — J301 Allergic rhinitis due to pollen: Secondary | ICD-10-CM | POA: Diagnosis not present

## 2024-03-06 DIAGNOSIS — J3089 Other allergic rhinitis: Secondary | ICD-10-CM | POA: Diagnosis not present

## 2024-03-06 DIAGNOSIS — J301 Allergic rhinitis due to pollen: Secondary | ICD-10-CM | POA: Diagnosis not present

## 2024-03-06 DIAGNOSIS — J3081 Allergic rhinitis due to animal (cat) (dog) hair and dander: Secondary | ICD-10-CM | POA: Diagnosis not present

## 2024-03-13 DIAGNOSIS — J3089 Other allergic rhinitis: Secondary | ICD-10-CM | POA: Diagnosis not present

## 2024-03-13 DIAGNOSIS — J301 Allergic rhinitis due to pollen: Secondary | ICD-10-CM | POA: Diagnosis not present

## 2024-03-13 DIAGNOSIS — J3081 Allergic rhinitis due to animal (cat) (dog) hair and dander: Secondary | ICD-10-CM | POA: Diagnosis not present

## 2024-03-20 DIAGNOSIS — J3081 Allergic rhinitis due to animal (cat) (dog) hair and dander: Secondary | ICD-10-CM | POA: Diagnosis not present

## 2024-03-20 DIAGNOSIS — J3089 Other allergic rhinitis: Secondary | ICD-10-CM | POA: Diagnosis not present

## 2024-03-20 DIAGNOSIS — J301 Allergic rhinitis due to pollen: Secondary | ICD-10-CM | POA: Diagnosis not present

## 2024-03-24 ENCOUNTER — Inpatient Hospital Stay: Admission: RE | Admit: 2024-03-24 | Source: Ambulatory Visit

## 2024-03-31 ENCOUNTER — Ambulatory Visit (INDEPENDENT_AMBULATORY_CARE_PROVIDER_SITE_OTHER): Admitting: Podiatry

## 2024-03-31 ENCOUNTER — Other Ambulatory Visit: Payer: Self-pay | Admitting: Podiatry

## 2024-03-31 ENCOUNTER — Ambulatory Visit (INDEPENDENT_AMBULATORY_CARE_PROVIDER_SITE_OTHER)

## 2024-03-31 DIAGNOSIS — B351 Tinea unguium: Secondary | ICD-10-CM

## 2024-03-31 DIAGNOSIS — M79674 Pain in right toe(s): Secondary | ICD-10-CM

## 2024-03-31 DIAGNOSIS — M7752 Other enthesopathy of left foot: Secondary | ICD-10-CM | POA: Diagnosis not present

## 2024-03-31 DIAGNOSIS — M79675 Pain in left toe(s): Secondary | ICD-10-CM

## 2024-03-31 NOTE — Patient Instructions (Signed)

## 2024-03-31 NOTE — Progress Notes (Unsigned)
 Subjective:   Patient ID: [name] [name], female   DOB: 88 y.o.   MRN: [id number]   HPI Chief Complaint  Patient presents with   Nail Problem    Nail trim      88 year old female presents for above concerns.  Her nails are thickened elongated she cannot trim them and they are causing discomfort.  She is also still been dealing sciatica and she is on another round of steroids.  She continues to get pain to the outside aspect of her ankle on the lateral aspect when she gets the sciatica symptoms.  No recent injuries or falls to her lower extremities.      Objective:  Physical Exam  General: AAO x3, NAD  Dermatological: The toenails are hypertrophic, dystrophic and elongated causing discomfort to nails 1 through 5 on the right and 2 through 5 on the left.  No open lesions.  Vascular: Dorsalis Pedis artery and Posterior Tibial artery pedal pulses are 2/4 bilateral with immedate capillary fill time. There is no pain with calf compression, swelling, warmth, erythema.   Neruologic: Grossly intact via light touch bilateral.   Musculoskeletal: There is no area pinpoint tenderness.  There is tenderness palpation along the lateral aspect of the foot along the sinus tarsi area.  There is no edema, erythema there is no area of pinpoint tenderness.  Ankle, subtalar joint range of motion intact.     Assessment:   Symptomatic onychomycosis; capsulitis left ankle/sciatica      Plan:  Symptomatic onychomycosis - Sharply debrided the toenails x 10 without any complications or bleeding  Left ankle pain - X-rays were obtained reviewed.  There is no evidence of acute fracture.  Dorsal lipping of the talonavicular joint on the lateral view. - We discussed steroid injection to the area today versus other treatment options.  She wishes to proceed with steroid injection verbal consent obtained.  I cleaned the skin with alcohol.  Mixture 1 cc betamethasone , 1 cc Marcaine plain was infiltrated into  the sinus tarsi without complications.  Postinjection care discussed.  Tolerated well.  [name] DPM

## 2024-04-02 DIAGNOSIS — J3081 Allergic rhinitis due to animal (cat) (dog) hair and dander: Secondary | ICD-10-CM | POA: Diagnosis not present

## 2024-04-02 DIAGNOSIS — J3089 Other allergic rhinitis: Secondary | ICD-10-CM | POA: Diagnosis not present

## 2024-04-02 DIAGNOSIS — J301 Allergic rhinitis due to pollen: Secondary | ICD-10-CM | POA: Diagnosis not present

## 2024-04-03 DIAGNOSIS — J301 Allergic rhinitis due to pollen: Secondary | ICD-10-CM | POA: Diagnosis not present

## 2024-04-03 DIAGNOSIS — J3089 Other allergic rhinitis: Secondary | ICD-10-CM | POA: Diagnosis not present

## 2024-04-10 DIAGNOSIS — J3081 Allergic rhinitis due to animal (cat) (dog) hair and dander: Secondary | ICD-10-CM | POA: Diagnosis not present

## 2024-04-10 DIAGNOSIS — J301 Allergic rhinitis due to pollen: Secondary | ICD-10-CM | POA: Diagnosis not present

## 2024-04-10 DIAGNOSIS — J3089 Other allergic rhinitis: Secondary | ICD-10-CM | POA: Diagnosis not present

## 2024-04-16 ENCOUNTER — Other Ambulatory Visit: Payer: Self-pay | Admitting: Cardiology

## 2024-05-19 ENCOUNTER — Ambulatory Visit: Admitting: Internal Medicine

## 2024-05-19 ENCOUNTER — Encounter: Payer: Self-pay | Admitting: Internal Medicine

## 2024-05-19 VITALS — BP 138/70 | HR 65 | Temp 97.6°F | Ht 63.0 in | Wt 110.2 lb

## 2024-05-19 DIAGNOSIS — F411 Generalized anxiety disorder: Secondary | ICD-10-CM | POA: Diagnosis not present

## 2024-05-19 DIAGNOSIS — F5101 Primary insomnia: Secondary | ICD-10-CM | POA: Diagnosis not present

## 2024-05-19 DIAGNOSIS — M5442 Lumbago with sciatica, left side: Secondary | ICD-10-CM | POA: Diagnosis not present

## 2024-05-19 NOTE — Progress Notes (Unsigned)
 "  Subjective:   Patient ID: [name], female    DOB: [birth year], 89 y.o.   MRN: [id number]  Discussed the use of AI scribe software for clinical note transcription with the patient, who gave verbal consent to proceed.  History of Present Illness [name] [name] is an 89 year old female who presents with adjustment difficulties following a recent move to an independent living facility and ongoing sciatic nerve pain.  She recently moved to an independent living facility on March 11, 2024, and finds the transition emotionally and physically challenging. She is struggling to adjust to new routines such as meal times and the overall lifestyle change. Despite having a long-time friend living nearby, she feels the impact of leaving her previous home and community. She feels 'weary and tired' and has not yet fully acclimated to her new environment.  She experiences significant sciatic nerve pain, radiating from her hip down to her ankle, which she describes as 'worse than a natural childbirth.' The pain is severe enough to require the use of a cane upon waking. She has received several injections for the pain, with the most recent one last week providing some relief. She is currently on a Dovato dose, with two more days remaining.  She experiences occasional dizziness when tilting her head back, particularly when looking up, which occurs sometimes in grocery stores. Her daughter notes some shortness of breath, but it is not a major concern for her. No new breathing problems, chest pain, stomach issues, headaches, or migraines.  Her sleep is generally good, although she occasionally experiences restless leg syndrome, which she manages by walking or applying ointment. She maintains physical activity by walking up and down the halls twice a day, covering at least a mile daily. She drinks a lot of water and occasionally uses smart water for electrolytes.  Review of Systems  Constitutional:   Positive for activity change.  HENT: Negative.    Eyes: Negative.   Respiratory:  Negative for cough, chest tightness and shortness of breath.   Cardiovascular:  Negative for chest pain, palpitations and leg swelling.  Gastrointestinal:  Negative for abdominal distention, abdominal pain, constipation, diarrhea, nausea and vomiting.  Musculoskeletal:  Positive for back pain.  Skin: Negative.   Neurological: Negative.   Psychiatric/Behavioral:  Positive for dysphoric mood and sleep disturbance. The patient is nervous/anxious.     Objective:  Physical Exam Constitutional:      Appearance: She is well-developed.  HENT:     Head: Normocephalic and atraumatic.  Cardiovascular:     Rate and Rhythm: Normal rate and regular rhythm.  Pulmonary:     Effort: Pulmonary effort is normal. No respiratory distress.     Breath sounds: Normal breath sounds. No wheezing or rales.  Abdominal:     General: Bowel sounds are normal. There is no distension.     Palpations: Abdomen is soft.     Tenderness: There is no abdominal tenderness.  Musculoskeletal:     Cervical back: Normal range of motion.  Skin:    General: Skin is warm and dry.  Neurological:     Mental Status: She is alert and oriented to person, place, and time.     Coordination: Coordination normal.     Vitals:   05/19/24 1010 05/19/24 1016  BP: (!) 150/70 138/70  Pulse: 65   Temp: 97.6 F (36.4 C)   TempSrc: Oral   SpO2: 98%   Weight: 110 lb 3.2 oz (50 kg)   Height:  5' 3 (1.6 m)    I personally spent a total of 33 minutes in the care of the patient today including getting/reviewing separately obtained history, performing a medically appropriate exam/evaluation, and counseling and educating.  Assessment and Plan Assessment & Plan Sciatica   Chronic sciatica has recently improved, with her being pain-free for five days. Continue physical therapy exercises at home. Encourage participation in yoga or Tai Chi for core strength  and balance.  Essential hypertension BP at goal on recheck and she has not taking meds today. Continue monitoring intermittently.   Chronic anxiety with adjustment She experiences emotional and physical stress from a recent move to an independent living facility. Encourage gradual adjustment to the new environment. Support social engagement and maintaining ties with previous neighbors.  Restless legs syndrome/insomnia   Intermittent symptoms cause sleep disturbances, managed with walking and topical ointment. Suspect could be related to her lumbar sciatica issues. Encourage hydration and occasional use of smart water for electrolyte balance. Continue current management strategies.   "

## 2024-05-28 ENCOUNTER — Encounter: Admitting: Obstetrics and Gynecology

## 2024-06-03 ENCOUNTER — Encounter: Admitting: Obstetrics and Gynecology

## 2024-06-30 ENCOUNTER — Ambulatory Visit: Admitting: Podiatry

## 2024-07-09 ENCOUNTER — Encounter: Admitting: Obstetrics and Gynecology

## 2024-08-27 ENCOUNTER — Ambulatory Visit: Admitting: Cardiology

## 2024-11-14 ENCOUNTER — Ambulatory Visit
